# Patient Record
Sex: Female | Born: 1945 | Race: White | Hispanic: No | Marital: Single | Smoking: Former smoker
Health system: Southern US, Community
[De-identification: ages and names within clinical notes are randomized; demographics above are authoritative.]

## PROBLEM LIST (undated history)

## (undated) DIAGNOSIS — F329 Major depressive disorder, single episode, unspecified: Secondary | ICD-10-CM

## (undated) DIAGNOSIS — I82409 Acute embolism and thrombosis of unspecified deep veins of unspecified lower extremity: Secondary | ICD-10-CM

## (undated) DIAGNOSIS — N3010 Interstitial cystitis (chronic) without hematuria: Secondary | ICD-10-CM

## (undated) DIAGNOSIS — M797 Fibromyalgia: Secondary | ICD-10-CM

## (undated) DIAGNOSIS — T8859XA Other complications of anesthesia, initial encounter: Secondary | ICD-10-CM

## (undated) DIAGNOSIS — F419 Anxiety disorder, unspecified: Secondary | ICD-10-CM

## (undated) DIAGNOSIS — M1712 Unilateral primary osteoarthritis, left knee: Secondary | ICD-10-CM

## (undated) DIAGNOSIS — T4145XA Adverse effect of unspecified anesthetic, initial encounter: Secondary | ICD-10-CM

## (undated) DIAGNOSIS — K219 Gastro-esophageal reflux disease without esophagitis: Secondary | ICD-10-CM

## (undated) DIAGNOSIS — N3941 Urge incontinence: Secondary | ICD-10-CM

## (undated) DIAGNOSIS — K589 Irritable bowel syndrome without diarrhea: Secondary | ICD-10-CM

## (undated) DIAGNOSIS — C4331 Malignant melanoma of nose: Secondary | ICD-10-CM

## (undated) DIAGNOSIS — Z6841 Body Mass Index (BMI) 40.0 and over, adult: Secondary | ICD-10-CM

## (undated) DIAGNOSIS — K8020 Calculus of gallbladder without cholecystitis without obstruction: Secondary | ICD-10-CM

## (undated) DIAGNOSIS — H269 Unspecified cataract: Secondary | ICD-10-CM

## (undated) DIAGNOSIS — K31819 Angiodysplasia of stomach and duodenum without bleeding: Secondary | ICD-10-CM

## (undated) DIAGNOSIS — G4733 Obstructive sleep apnea (adult) (pediatric): Secondary | ICD-10-CM

## (undated) DIAGNOSIS — E785 Hyperlipidemia, unspecified: Secondary | ICD-10-CM

## (undated) DIAGNOSIS — Z9289 Personal history of other medical treatment: Secondary | ICD-10-CM

## (undated) DIAGNOSIS — Z8489 Family history of other specified conditions: Secondary | ICD-10-CM

## (undated) DIAGNOSIS — G8929 Other chronic pain: Secondary | ICD-10-CM

## (undated) DIAGNOSIS — G510 Bell's palsy: Secondary | ICD-10-CM

## (undated) DIAGNOSIS — I872 Venous insufficiency (chronic) (peripheral): Secondary | ICD-10-CM

## (undated) DIAGNOSIS — C50919 Malignant neoplasm of unspecified site of unspecified female breast: Secondary | ICD-10-CM

## (undated) DIAGNOSIS — I700 Atherosclerosis of aorta: Secondary | ICD-10-CM

## (undated) DIAGNOSIS — Z720 Tobacco use: Secondary | ICD-10-CM

## (undated) DIAGNOSIS — M545 Low back pain: Secondary | ICD-10-CM

## (undated) HISTORY — DX: Urge incontinence: N39.41

## (undated) HISTORY — DX: Body Mass Index (BMI) 40.0 and over, adult: Z684

## (undated) HISTORY — DX: Irritable bowel syndrome, unspecified: K58.9

## (undated) HISTORY — DX: Calculus of gallbladder without cholecystitis without obstruction: K80.20

## (undated) HISTORY — DX: Unspecified cataract: H26.9

## (undated) HISTORY — PX: MASTECTOMY: SHX3

## (undated) HISTORY — DX: Morbid (severe) obesity due to excess calories: E66.01

## (undated) HISTORY — DX: Tobacco use: Z72.0

## (undated) HISTORY — DX: Gastro-esophageal reflux disease without esophagitis: K21.9

## (undated) HISTORY — PX: MELANOMA EXCISION: SHX5266

## (undated) HISTORY — PX: TUBAL LIGATION: SHX77

## (undated) HISTORY — DX: Interstitial cystitis (chronic) without hematuria: N30.10

## (undated) HISTORY — DX: Major depressive disorder, single episode, unspecified: F32.9

## (undated) HISTORY — PX: DILATION AND CURETTAGE OF UTERUS: SHX78

## (undated) HISTORY — DX: Angiodysplasia of stomach and duodenum without bleeding: K31.819

## (undated) HISTORY — DX: Obstructive sleep apnea (adult) (pediatric): G47.33

## (undated) HISTORY — DX: Atherosclerosis of aorta: I70.0

## (undated) HISTORY — DX: Personal history of other medical treatment: Z92.89

## (undated) HISTORY — DX: Acute embolism and thrombosis of unspecified deep veins of unspecified lower extremity: I82.409

## (undated) HISTORY — DX: Low back pain: M54.5

## (undated) HISTORY — PX: CERVICAL DISCECTOMY: SHX98

## (undated) HISTORY — DX: Venous insufficiency (chronic) (peripheral): I87.2

## (undated) HISTORY — DX: Unilateral primary osteoarthritis, left knee: M17.12

## (undated) HISTORY — PX: APPENDECTOMY: SHX54

## (undated) HISTORY — DX: Hyperlipidemia, unspecified: E78.5

## (undated) HISTORY — DX: Other chronic pain: G89.29

## (undated) HISTORY — PX: SPINE SURGERY: SHX786

## (undated) HISTORY — PX: BACK SURGERY: SHX140

## (undated) HISTORY — PX: CARPAL TUNNEL RELEASE: SHX101

---
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
## 1974-01-20 HISTORY — PX: ABDOMINAL HYSTERECTOMY: SHX81

## 1978-09-21 DIAGNOSIS — G510 Bell's palsy: Secondary | ICD-10-CM

## 1978-09-21 HISTORY — DX: Bell's palsy: G51.0

## 1990-01-20 HISTORY — PX: BREAST SURGERY: SHX581

## 1991-01-21 HISTORY — PX: ORIF ANKLE FRACTURE: SUR919

## 1991-01-21 HISTORY — PX: BLADDER SUSPENSION: SHX72

## 1991-01-21 HISTORY — PX: RHINOPLASTY: SUR1284

## 1997-05-02 ENCOUNTER — Emergency Department (HOSPITAL_COMMUNITY): Admission: EM | Admit: 1997-05-02 | Discharge: 1997-05-02 | Payer: Self-pay | Admitting: Emergency Medicine

## 1997-05-05 ENCOUNTER — Encounter: Admission: RE | Admit: 1997-05-05 | Discharge: 1997-05-05 | Payer: Self-pay | Admitting: Internal Medicine

## 1997-05-12 ENCOUNTER — Encounter: Admission: RE | Admit: 1997-05-12 | Discharge: 1997-05-12 | Payer: Self-pay | Admitting: Internal Medicine

## 1997-05-19 ENCOUNTER — Encounter: Admission: RE | Admit: 1997-05-19 | Discharge: 1997-05-19 | Payer: Self-pay | Admitting: Internal Medicine

## 1997-06-15 ENCOUNTER — Encounter: Admission: RE | Admit: 1997-06-15 | Discharge: 1997-06-15 | Payer: Self-pay | Admitting: Internal Medicine

## 1997-06-27 ENCOUNTER — Ambulatory Visit: Admission: RE | Admit: 1997-06-27 | Discharge: 1997-06-27 | Payer: Self-pay | Admitting: *Deleted

## 1997-07-07 ENCOUNTER — Encounter: Admission: RE | Admit: 1997-07-07 | Discharge: 1997-07-07 | Payer: Self-pay | Admitting: Hematology and Oncology

## 1997-07-07 ENCOUNTER — Ambulatory Visit (HOSPITAL_COMMUNITY): Admission: RE | Admit: 1997-07-07 | Discharge: 1997-07-07 | Payer: Self-pay | Admitting: *Deleted

## 1997-07-12 ENCOUNTER — Ambulatory Visit (HOSPITAL_COMMUNITY): Admission: RE | Admit: 1997-07-12 | Discharge: 1997-07-12 | Payer: Self-pay | Admitting: *Deleted

## 1997-07-14 ENCOUNTER — Encounter: Admission: RE | Admit: 1997-07-14 | Discharge: 1997-07-14 | Payer: Self-pay | Admitting: Internal Medicine

## 1997-07-26 ENCOUNTER — Encounter: Admission: RE | Admit: 1997-07-26 | Discharge: 1997-07-26 | Payer: Self-pay | Admitting: Internal Medicine

## 1997-09-04 ENCOUNTER — Ambulatory Visit (HOSPITAL_BASED_OUTPATIENT_CLINIC_OR_DEPARTMENT_OTHER): Admission: RE | Admit: 1997-09-04 | Discharge: 1997-09-04 | Payer: Self-pay | Admitting: Plastic Surgery

## 1997-09-20 ENCOUNTER — Encounter: Admission: RE | Admit: 1997-09-20 | Discharge: 1997-09-20 | Payer: Self-pay | Admitting: Internal Medicine

## 1997-11-01 ENCOUNTER — Encounter: Admission: RE | Admit: 1997-11-01 | Discharge: 1997-11-01 | Payer: Self-pay | Admitting: Internal Medicine

## 1997-11-24 ENCOUNTER — Encounter: Admission: RE | Admit: 1997-11-24 | Discharge: 1997-11-24 | Payer: Self-pay | Admitting: Internal Medicine

## 1998-02-28 ENCOUNTER — Encounter: Admission: RE | Admit: 1998-02-28 | Discharge: 1998-02-28 | Payer: Self-pay | Admitting: Internal Medicine

## 1998-03-01 ENCOUNTER — Encounter: Admission: RE | Admit: 1998-03-01 | Discharge: 1998-03-01 | Payer: Self-pay | Admitting: Internal Medicine

## 1998-03-07 ENCOUNTER — Encounter: Payer: Self-pay | Admitting: Hematology and Oncology

## 1998-03-07 ENCOUNTER — Encounter: Admission: RE | Admit: 1998-03-07 | Discharge: 1998-03-07 | Payer: Self-pay | Admitting: Internal Medicine

## 1998-03-07 ENCOUNTER — Ambulatory Visit (HOSPITAL_COMMUNITY): Admission: RE | Admit: 1998-03-07 | Discharge: 1998-03-07 | Payer: Self-pay | Admitting: Hematology and Oncology

## 1998-03-15 ENCOUNTER — Ambulatory Visit (HOSPITAL_COMMUNITY): Admission: RE | Admit: 1998-03-15 | Discharge: 1998-03-15 | Payer: Self-pay | Admitting: Gastroenterology

## 1998-04-19 ENCOUNTER — Encounter: Admission: RE | Admit: 1998-04-19 | Discharge: 1998-04-19 | Payer: Self-pay | Admitting: Internal Medicine

## 1998-06-11 ENCOUNTER — Encounter: Admission: RE | Admit: 1998-06-11 | Discharge: 1998-06-11 | Payer: Self-pay | Admitting: Internal Medicine

## 1998-06-11 ENCOUNTER — Ambulatory Visit (HOSPITAL_COMMUNITY): Admission: RE | Admit: 1998-06-11 | Discharge: 1998-06-11 | Payer: Self-pay | Admitting: Internal Medicine

## 1998-06-22 ENCOUNTER — Encounter: Admission: RE | Admit: 1998-06-22 | Discharge: 1998-06-22 | Payer: Self-pay | Admitting: Internal Medicine

## 1998-06-26 ENCOUNTER — Encounter: Admission: RE | Admit: 1998-06-26 | Discharge: 1998-06-26 | Payer: Self-pay | Admitting: Internal Medicine

## 1998-07-17 ENCOUNTER — Encounter: Admission: RE | Admit: 1998-07-17 | Discharge: 1998-07-17 | Payer: Self-pay | Admitting: Internal Medicine

## 1998-08-07 ENCOUNTER — Encounter: Admission: RE | Admit: 1998-08-07 | Discharge: 1998-08-07 | Payer: Self-pay | Admitting: Hematology and Oncology

## 1998-08-20 ENCOUNTER — Ambulatory Visit (HOSPITAL_BASED_OUTPATIENT_CLINIC_OR_DEPARTMENT_OTHER): Admission: RE | Admit: 1998-08-20 | Discharge: 1998-08-20 | Payer: Self-pay | Admitting: Urology

## 1998-09-04 ENCOUNTER — Encounter: Admission: RE | Admit: 1998-09-04 | Discharge: 1998-09-04 | Payer: Self-pay | Admitting: Internal Medicine

## 1998-11-09 ENCOUNTER — Encounter: Admission: RE | Admit: 1998-11-09 | Discharge: 1998-11-09 | Payer: Self-pay | Admitting: Internal Medicine

## 1998-11-13 ENCOUNTER — Encounter: Admission: RE | Admit: 1998-11-13 | Discharge: 1998-11-13 | Payer: Self-pay | Admitting: Internal Medicine

## 1998-11-14 ENCOUNTER — Encounter: Admission: RE | Admit: 1998-11-14 | Discharge: 1998-11-14 | Payer: Self-pay | Admitting: Internal Medicine

## 1998-12-11 ENCOUNTER — Encounter: Admission: RE | Admit: 1998-12-11 | Discharge: 1998-12-11 | Payer: Self-pay | Admitting: Internal Medicine

## 1998-12-28 ENCOUNTER — Encounter: Admission: RE | Admit: 1998-12-28 | Discharge: 1998-12-28 | Payer: Self-pay | Admitting: Internal Medicine

## 1999-02-12 ENCOUNTER — Encounter: Admission: RE | Admit: 1999-02-12 | Discharge: 1999-02-12 | Payer: Self-pay | Admitting: Internal Medicine

## 1999-07-16 ENCOUNTER — Encounter: Admission: RE | Admit: 1999-07-16 | Discharge: 1999-07-16 | Payer: Self-pay | Admitting: Internal Medicine

## 1999-07-20 ENCOUNTER — Ambulatory Visit (HOSPITAL_COMMUNITY): Admission: RE | Admit: 1999-07-20 | Discharge: 1999-07-20 | Payer: Self-pay | Admitting: Internal Medicine

## 1999-07-20 ENCOUNTER — Encounter: Payer: Self-pay | Admitting: Internal Medicine

## 1999-09-17 ENCOUNTER — Encounter: Admission: RE | Admit: 1999-09-17 | Discharge: 1999-09-17 | Payer: Self-pay | Admitting: Internal Medicine

## 1999-11-16 ENCOUNTER — Encounter: Payer: Self-pay | Admitting: Internal Medicine

## 1999-11-16 ENCOUNTER — Ambulatory Visit (HOSPITAL_COMMUNITY): Admission: RE | Admit: 1999-11-16 | Discharge: 1999-11-16 | Payer: Self-pay | Admitting: Internal Medicine

## 1999-11-19 ENCOUNTER — Encounter: Admission: RE | Admit: 1999-11-19 | Discharge: 1999-11-19 | Payer: Self-pay | Admitting: Internal Medicine

## 1999-11-28 ENCOUNTER — Encounter: Admission: RE | Admit: 1999-11-28 | Discharge: 1999-11-28 | Payer: Self-pay | Admitting: Hematology and Oncology

## 1999-12-05 ENCOUNTER — Encounter: Admission: RE | Admit: 1999-12-05 | Discharge: 1999-12-05 | Payer: Self-pay | Admitting: Internal Medicine

## 2000-02-11 ENCOUNTER — Encounter: Admission: RE | Admit: 2000-02-11 | Discharge: 2000-02-11 | Payer: Self-pay | Admitting: Internal Medicine

## 2000-04-04 ENCOUNTER — Ambulatory Visit (HOSPITAL_COMMUNITY): Admission: RE | Admit: 2000-04-04 | Discharge: 2000-04-04 | Payer: Self-pay | Admitting: Internal Medicine

## 2000-04-04 ENCOUNTER — Encounter: Payer: Self-pay | Admitting: Internal Medicine

## 2000-04-07 ENCOUNTER — Encounter: Admission: RE | Admit: 2000-04-07 | Discharge: 2000-04-07 | Payer: Self-pay | Admitting: Internal Medicine

## 2000-07-28 ENCOUNTER — Encounter: Admission: RE | Admit: 2000-07-28 | Discharge: 2000-07-28 | Payer: Self-pay | Admitting: Internal Medicine

## 2000-08-14 ENCOUNTER — Encounter: Admission: RE | Admit: 2000-08-14 | Discharge: 2000-08-14 | Payer: Self-pay | Admitting: Internal Medicine

## 2000-09-08 ENCOUNTER — Ambulatory Visit (HOSPITAL_COMMUNITY): Admission: RE | Admit: 2000-09-08 | Discharge: 2000-09-08 | Payer: Self-pay | Admitting: *Deleted

## 2001-01-05 ENCOUNTER — Encounter: Admission: RE | Admit: 2001-01-05 | Discharge: 2001-01-05 | Payer: Self-pay | Admitting: Internal Medicine

## 2001-05-04 ENCOUNTER — Encounter: Payer: Self-pay | Admitting: Internal Medicine

## 2001-05-04 ENCOUNTER — Ambulatory Visit (HOSPITAL_COMMUNITY): Admission: RE | Admit: 2001-05-04 | Discharge: 2001-05-04 | Payer: Self-pay | Admitting: Internal Medicine

## 2001-05-11 ENCOUNTER — Encounter: Admission: RE | Admit: 2001-05-11 | Discharge: 2001-05-11 | Payer: Self-pay | Admitting: Internal Medicine

## 2001-05-19 ENCOUNTER — Emergency Department (HOSPITAL_COMMUNITY): Admission: EM | Admit: 2001-05-19 | Discharge: 2001-05-19 | Payer: Self-pay | Admitting: Emergency Medicine

## 2001-05-19 ENCOUNTER — Encounter: Payer: Self-pay | Admitting: Emergency Medicine

## 2001-09-07 ENCOUNTER — Encounter: Admission: RE | Admit: 2001-09-07 | Discharge: 2001-09-07 | Payer: Self-pay | Admitting: Internal Medicine

## 2001-09-20 DIAGNOSIS — C4331 Malignant melanoma of nose: Secondary | ICD-10-CM

## 2001-09-20 HISTORY — DX: Malignant melanoma of nose: C43.31

## 2001-11-09 ENCOUNTER — Encounter: Admission: RE | Admit: 2001-11-09 | Discharge: 2001-11-09 | Payer: Self-pay | Admitting: Internal Medicine

## 2002-01-04 ENCOUNTER — Encounter: Admission: RE | Admit: 2002-01-04 | Discharge: 2002-01-04 | Payer: Self-pay | Admitting: Internal Medicine

## 2002-02-09 ENCOUNTER — Encounter: Admission: RE | Admit: 2002-02-09 | Discharge: 2002-02-09 | Payer: Self-pay | Admitting: Internal Medicine

## 2002-02-23 ENCOUNTER — Encounter: Payer: Self-pay | Admitting: Internal Medicine

## 2002-02-23 ENCOUNTER — Ambulatory Visit (HOSPITAL_COMMUNITY): Admission: RE | Admit: 2002-02-23 | Discharge: 2002-02-23 | Payer: Self-pay | Admitting: Internal Medicine

## 2002-02-23 ENCOUNTER — Encounter: Admission: RE | Admit: 2002-02-23 | Discharge: 2002-02-23 | Payer: Self-pay | Admitting: Internal Medicine

## 2002-03-04 ENCOUNTER — Encounter: Admission: RE | Admit: 2002-03-04 | Discharge: 2002-03-04 | Payer: Self-pay | Admitting: Sports Medicine

## 2002-03-04 ENCOUNTER — Encounter: Payer: Self-pay | Admitting: Sports Medicine

## 2002-03-10 ENCOUNTER — Encounter: Admission: RE | Admit: 2002-03-10 | Discharge: 2002-03-10 | Payer: Self-pay | Admitting: Sports Medicine

## 2002-03-10 ENCOUNTER — Encounter: Payer: Self-pay | Admitting: Sports Medicine

## 2002-04-05 ENCOUNTER — Encounter: Admission: RE | Admit: 2002-04-05 | Discharge: 2002-04-05 | Payer: Self-pay | Admitting: Internal Medicine

## 2002-04-20 ENCOUNTER — Encounter: Admission: RE | Admit: 2002-04-20 | Discharge: 2002-04-20 | Payer: Self-pay | Admitting: Neurosurgery

## 2002-04-20 ENCOUNTER — Encounter: Payer: Self-pay | Admitting: Neurosurgery

## 2002-04-24 ENCOUNTER — Emergency Department (HOSPITAL_COMMUNITY): Admission: EM | Admit: 2002-04-24 | Discharge: 2002-04-25 | Payer: Self-pay | Admitting: Emergency Medicine

## 2002-04-25 ENCOUNTER — Encounter: Payer: Self-pay | Admitting: Emergency Medicine

## 2002-06-02 ENCOUNTER — Ambulatory Visit (HOSPITAL_COMMUNITY): Admission: RE | Admit: 2002-06-02 | Discharge: 2002-06-02 | Payer: Self-pay | Admitting: Neurosurgery

## 2002-06-02 ENCOUNTER — Encounter: Payer: Self-pay | Admitting: Neurosurgery

## 2002-06-14 ENCOUNTER — Encounter: Payer: Self-pay | Admitting: Neurosurgery

## 2002-06-15 ENCOUNTER — Encounter: Payer: Self-pay | Admitting: Neurosurgery

## 2002-06-15 ENCOUNTER — Ambulatory Visit (HOSPITAL_COMMUNITY): Admission: RE | Admit: 2002-06-15 | Discharge: 2002-06-16 | Payer: Self-pay | Admitting: Neurosurgery

## 2002-07-12 ENCOUNTER — Encounter: Payer: Self-pay | Admitting: Neurosurgery

## 2002-07-12 ENCOUNTER — Encounter: Admission: RE | Admit: 2002-07-12 | Discharge: 2002-07-12 | Payer: Self-pay | Admitting: Neurosurgery

## 2002-08-23 ENCOUNTER — Encounter: Admission: RE | Admit: 2002-08-23 | Discharge: 2002-08-23 | Payer: Self-pay | Admitting: Internal Medicine

## 2002-09-06 ENCOUNTER — Ambulatory Visit (HOSPITAL_BASED_OUTPATIENT_CLINIC_OR_DEPARTMENT_OTHER): Admission: RE | Admit: 2002-09-06 | Discharge: 2002-09-06 | Payer: Self-pay | Admitting: Internal Medicine

## 2002-12-27 ENCOUNTER — Encounter: Admission: RE | Admit: 2002-12-27 | Discharge: 2002-12-27 | Payer: Self-pay | Admitting: Internal Medicine

## 2003-01-06 ENCOUNTER — Emergency Department (HOSPITAL_COMMUNITY): Admission: EM | Admit: 2003-01-06 | Discharge: 2003-01-06 | Payer: Self-pay | Admitting: Emergency Medicine

## 2003-01-10 ENCOUNTER — Encounter: Admission: RE | Admit: 2003-01-10 | Discharge: 2003-01-10 | Payer: Self-pay | Admitting: Internal Medicine

## 2003-01-10 ENCOUNTER — Inpatient Hospital Stay (HOSPITAL_COMMUNITY): Admission: AD | Admit: 2003-01-10 | Discharge: 2003-01-15 | Payer: Self-pay | Admitting: Internal Medicine

## 2003-01-18 ENCOUNTER — Encounter: Admission: RE | Admit: 2003-01-18 | Discharge: 2003-01-18 | Payer: Self-pay | Admitting: Internal Medicine

## 2003-01-25 ENCOUNTER — Ambulatory Visit (HOSPITAL_BASED_OUTPATIENT_CLINIC_OR_DEPARTMENT_OTHER): Admission: RE | Admit: 2003-01-25 | Discharge: 2003-01-25 | Payer: Self-pay | Admitting: Internal Medicine

## 2003-01-31 ENCOUNTER — Encounter: Admission: RE | Admit: 2003-01-31 | Discharge: 2003-01-31 | Payer: Self-pay | Admitting: Internal Medicine

## 2003-03-07 ENCOUNTER — Encounter: Admission: RE | Admit: 2003-03-07 | Discharge: 2003-03-07 | Payer: Self-pay | Admitting: Internal Medicine

## 2003-03-13 ENCOUNTER — Encounter: Admission: RE | Admit: 2003-03-13 | Discharge: 2003-03-13 | Payer: Self-pay | Admitting: Internal Medicine

## 2003-05-09 ENCOUNTER — Encounter: Admission: RE | Admit: 2003-05-09 | Discharge: 2003-05-09 | Payer: Self-pay | Admitting: Internal Medicine

## 2003-08-04 ENCOUNTER — Emergency Department (HOSPITAL_COMMUNITY): Admission: EM | Admit: 2003-08-04 | Discharge: 2003-08-05 | Payer: Self-pay | Admitting: Emergency Medicine

## 2003-08-07 ENCOUNTER — Encounter: Admission: RE | Admit: 2003-08-07 | Discharge: 2003-08-07 | Payer: Self-pay | Admitting: Internal Medicine

## 2003-08-15 ENCOUNTER — Ambulatory Visit: Admission: RE | Admit: 2003-08-15 | Discharge: 2003-08-15 | Payer: Self-pay | Admitting: Internal Medicine

## 2003-08-15 ENCOUNTER — Encounter (INDEPENDENT_AMBULATORY_CARE_PROVIDER_SITE_OTHER): Payer: Self-pay | Admitting: Cardiology

## 2003-08-16 ENCOUNTER — Ambulatory Visit (HOSPITAL_COMMUNITY): Admission: RE | Admit: 2003-08-16 | Discharge: 2003-08-16 | Payer: Self-pay | Admitting: Internal Medicine

## 2003-08-17 ENCOUNTER — Ambulatory Visit (HOSPITAL_COMMUNITY): Admission: RE | Admit: 2003-08-17 | Discharge: 2003-08-17 | Payer: Self-pay | Admitting: Internal Medicine

## 2003-08-17 ENCOUNTER — Encounter: Admission: RE | Admit: 2003-08-17 | Discharge: 2003-08-17 | Payer: Self-pay | Admitting: Internal Medicine

## 2003-08-24 ENCOUNTER — Encounter: Admission: RE | Admit: 2003-08-24 | Discharge: 2003-08-24 | Payer: Self-pay | Admitting: Internal Medicine

## 2003-09-21 ENCOUNTER — Ambulatory Visit: Payer: Self-pay | Admitting: Internal Medicine

## 2003-10-25 ENCOUNTER — Ambulatory Visit: Payer: Self-pay | Admitting: Internal Medicine

## 2003-11-02 ENCOUNTER — Ambulatory Visit: Payer: Self-pay | Admitting: Internal Medicine

## 2003-12-21 ENCOUNTER — Ambulatory Visit (HOSPITAL_COMMUNITY): Admission: RE | Admit: 2003-12-21 | Discharge: 2003-12-21 | Payer: Self-pay | Admitting: Gastroenterology

## 2004-02-20 ENCOUNTER — Ambulatory Visit: Payer: Self-pay | Admitting: Internal Medicine

## 2004-05-16 ENCOUNTER — Emergency Department (HOSPITAL_COMMUNITY): Admission: EM | Admit: 2004-05-16 | Discharge: 2004-05-16 | Payer: Self-pay | Admitting: Emergency Medicine

## 2004-06-25 ENCOUNTER — Ambulatory Visit: Payer: Self-pay | Admitting: Internal Medicine

## 2004-06-25 ENCOUNTER — Ambulatory Visit (HOSPITAL_COMMUNITY): Admission: RE | Admit: 2004-06-25 | Discharge: 2004-06-25 | Payer: Self-pay | Admitting: Internal Medicine

## 2004-07-30 ENCOUNTER — Ambulatory Visit: Payer: Self-pay | Admitting: Internal Medicine

## 2004-10-08 ENCOUNTER — Ambulatory Visit: Payer: Self-pay | Admitting: Internal Medicine

## 2004-11-27 ENCOUNTER — Ambulatory Visit: Payer: Self-pay | Admitting: Internal Medicine

## 2004-12-06 ENCOUNTER — Emergency Department (HOSPITAL_COMMUNITY): Admission: EM | Admit: 2004-12-06 | Discharge: 2004-12-06 | Payer: Self-pay | Admitting: Emergency Medicine

## 2004-12-19 ENCOUNTER — Ambulatory Visit: Payer: Self-pay | Admitting: Internal Medicine

## 2004-12-19 ENCOUNTER — Ambulatory Visit (HOSPITAL_COMMUNITY): Admission: RE | Admit: 2004-12-19 | Discharge: 2004-12-19 | Payer: Self-pay | Admitting: Hospitalist

## 2004-12-24 ENCOUNTER — Encounter: Admission: RE | Admit: 2004-12-24 | Discharge: 2004-12-24 | Payer: Self-pay | Admitting: Internal Medicine

## 2005-02-04 ENCOUNTER — Ambulatory Visit: Payer: Self-pay | Admitting: Internal Medicine

## 2005-02-07 ENCOUNTER — Ambulatory Visit: Payer: Self-pay | Admitting: Internal Medicine

## 2005-03-04 ENCOUNTER — Ambulatory Visit: Payer: Self-pay | Admitting: Internal Medicine

## 2005-03-17 ENCOUNTER — Ambulatory Visit: Payer: Self-pay | Admitting: Internal Medicine

## 2005-09-02 ENCOUNTER — Ambulatory Visit: Payer: Self-pay | Admitting: Internal Medicine

## 2005-09-11 ENCOUNTER — Encounter (INDEPENDENT_AMBULATORY_CARE_PROVIDER_SITE_OTHER): Payer: Self-pay | Admitting: Internal Medicine

## 2005-09-11 ENCOUNTER — Ambulatory Visit (HOSPITAL_COMMUNITY): Admission: RE | Admit: 2005-09-11 | Discharge: 2005-09-11 | Payer: Self-pay | Admitting: Internal Medicine

## 2005-09-11 ENCOUNTER — Encounter: Payer: Self-pay | Admitting: Pulmonary Disease

## 2005-10-07 ENCOUNTER — Encounter: Admission: RE | Admit: 2005-10-07 | Discharge: 2005-11-06 | Payer: Self-pay | Admitting: *Deleted

## 2005-11-05 DIAGNOSIS — F339 Major depressive disorder, recurrent, unspecified: Secondary | ICD-10-CM | POA: Insufficient documentation

## 2005-11-05 DIAGNOSIS — F329 Major depressive disorder, single episode, unspecified: Secondary | ICD-10-CM

## 2005-11-05 DIAGNOSIS — M545 Low back pain, unspecified: Secondary | ICD-10-CM | POA: Insufficient documentation

## 2005-11-05 DIAGNOSIS — I872 Venous insufficiency (chronic) (peripheral): Secondary | ICD-10-CM | POA: Insufficient documentation

## 2005-11-05 DIAGNOSIS — Z720 Tobacco use: Secondary | ICD-10-CM

## 2005-11-05 DIAGNOSIS — K589 Irritable bowel syndrome without diarrhea: Secondary | ICD-10-CM | POA: Insufficient documentation

## 2005-11-05 DIAGNOSIS — G4733 Obstructive sleep apnea (adult) (pediatric): Secondary | ICD-10-CM

## 2005-11-05 DIAGNOSIS — N3010 Interstitial cystitis (chronic) without hematuria: Secondary | ICD-10-CM | POA: Insufficient documentation

## 2005-11-05 DIAGNOSIS — Z85828 Personal history of other malignant neoplasm of skin: Secondary | ICD-10-CM

## 2005-11-05 DIAGNOSIS — G8929 Other chronic pain: Secondary | ICD-10-CM

## 2005-11-05 HISTORY — DX: Obstructive sleep apnea (adult) (pediatric): G47.33

## 2005-11-05 HISTORY — DX: Low back pain, unspecified: M54.50

## 2005-11-05 HISTORY — DX: Tobacco use: Z72.0

## 2005-11-05 HISTORY — DX: Major depressive disorder, single episode, unspecified: F32.9

## 2005-11-10 ENCOUNTER — Ambulatory Visit: Payer: Self-pay | Admitting: Internal Medicine

## 2005-11-11 ENCOUNTER — Ambulatory Visit: Payer: Self-pay | Admitting: Internal Medicine

## 2005-11-25 ENCOUNTER — Encounter: Admission: RE | Admit: 2005-11-25 | Discharge: 2006-02-10 | Payer: Self-pay | Admitting: Internal Medicine

## 2005-12-25 DIAGNOSIS — Z8669 Personal history of other diseases of the nervous system and sense organs: Secondary | ICD-10-CM | POA: Insufficient documentation

## 2005-12-25 DIAGNOSIS — Z978 Presence of other specified devices: Secondary | ICD-10-CM

## 2005-12-25 DIAGNOSIS — Z9010 Acquired absence of unspecified breast and nipple: Secondary | ICD-10-CM

## 2005-12-25 DIAGNOSIS — Z9079 Acquired absence of other genital organ(s): Secondary | ICD-10-CM | POA: Insufficient documentation

## 2005-12-25 DIAGNOSIS — Z9889 Other specified postprocedural states: Secondary | ICD-10-CM

## 2005-12-25 DIAGNOSIS — S82899B Other fracture of unspecified lower leg, initial encounter for open fracture type I or II: Secondary | ICD-10-CM | POA: Insufficient documentation

## 2005-12-26 DIAGNOSIS — T7411XA Adult physical abuse, confirmed, initial encounter: Secondary | ICD-10-CM | POA: Insufficient documentation

## 2005-12-26 DIAGNOSIS — Z9889 Other specified postprocedural states: Secondary | ICD-10-CM

## 2006-04-07 ENCOUNTER — Ambulatory Visit: Payer: Self-pay | Admitting: Internal Medicine

## 2006-04-07 DIAGNOSIS — R42 Dizziness and giddiness: Secondary | ICD-10-CM

## 2006-04-23 ENCOUNTER — Encounter (INDEPENDENT_AMBULATORY_CARE_PROVIDER_SITE_OTHER): Payer: Self-pay | Admitting: Internal Medicine

## 2006-04-23 ENCOUNTER — Ambulatory Visit: Payer: Self-pay | Admitting: Internal Medicine

## 2006-05-08 LAB — CONVERTED CEMR LAB
Albumin: 3.8 g/dL (ref 3.5–5.2)
Alkaline Phosphatase: 167 units/L — ABNORMAL HIGH (ref 39–117)
Calcium: 8.6 mg/dL (ref 8.4–10.5)
Chloride: 109 meq/L (ref 96–112)
Glucose, Bld: 119 mg/dL — ABNORMAL HIGH (ref 70–99)
LDL Cholesterol: 85 mg/dL (ref 0–99)
Potassium: 4.3 meq/L (ref 3.5–5.3)
Sodium: 146 meq/L — ABNORMAL HIGH (ref 135–145)
Total Protein: 6.3 g/dL (ref 6.0–8.3)
Triglycerides: 139 mg/dL (ref <150)

## 2006-07-21 ENCOUNTER — Telehealth: Payer: Self-pay | Admitting: *Deleted

## 2006-08-04 ENCOUNTER — Encounter (INDEPENDENT_AMBULATORY_CARE_PROVIDER_SITE_OTHER): Payer: Self-pay | Admitting: *Deleted

## 2006-08-04 ENCOUNTER — Ambulatory Visit (HOSPITAL_COMMUNITY): Admission: RE | Admit: 2006-08-04 | Discharge: 2006-08-04 | Payer: Self-pay | Admitting: Internal Medicine

## 2006-08-04 ENCOUNTER — Encounter: Payer: Self-pay | Admitting: Internal Medicine

## 2006-08-04 ENCOUNTER — Ambulatory Visit: Payer: Self-pay | Admitting: *Deleted

## 2006-08-04 ENCOUNTER — Ambulatory Visit: Payer: Self-pay | Admitting: Hospitalist

## 2006-08-04 ENCOUNTER — Encounter (INDEPENDENT_AMBULATORY_CARE_PROVIDER_SITE_OTHER): Payer: Self-pay | Admitting: Internal Medicine

## 2006-08-04 DIAGNOSIS — R609 Edema, unspecified: Secondary | ICD-10-CM

## 2006-08-25 ENCOUNTER — Ambulatory Visit (HOSPITAL_COMMUNITY): Admission: RE | Admit: 2006-08-25 | Discharge: 2006-08-25 | Payer: Self-pay | Admitting: Internal Medicine

## 2006-08-25 ENCOUNTER — Ambulatory Visit: Payer: Self-pay | Admitting: Internal Medicine

## 2006-08-25 LAB — CONVERTED CEMR LAB
ALT: 13 units/L (ref 0–35)
AST: 17 units/L (ref 0–37)
Albumin: 3.4 g/dL — ABNORMAL LOW (ref 3.5–5.2)
Alkaline Phosphatase: 138 units/L — ABNORMAL HIGH (ref 39–117)
BUN: 10 mg/dL (ref 6–23)
CO2: 31 meq/L (ref 19–32)
Calcium: 8.6 mg/dL (ref 8.4–10.5)
Chloride: 107 meq/L (ref 96–112)
Creatinine, Ser: 0.85 mg/dL (ref 0.40–1.20)
Creatinine, Urine: 158.9 mg/dL
Glucose, Bld: 103 mg/dL — ABNORMAL HIGH (ref 70–99)
Microalb Creat Ratio: 7.7 mg/g (ref 0.0–30.0)
Microalb, Ur: 1.23 mg/dL (ref 0.00–1.89)
Potassium: 3.3 meq/L — ABNORMAL LOW (ref 3.5–5.3)
Pro B Natriuretic peptide (BNP): 76 pg/mL (ref 0.0–100.0)
Sodium: 144 meq/L (ref 135–145)
TSH: 1.461 microintl units/mL (ref 0.350–5.50)
Total Bilirubin: 0.4 mg/dL (ref 0.3–1.2)
Total Protein: 6.1 g/dL (ref 6.0–8.3)

## 2006-09-10 ENCOUNTER — Encounter (INDEPENDENT_AMBULATORY_CARE_PROVIDER_SITE_OTHER): Payer: Self-pay | Admitting: Internal Medicine

## 2006-09-10 ENCOUNTER — Ambulatory Visit: Admission: RE | Admit: 2006-09-10 | Discharge: 2006-09-10 | Payer: Self-pay | Admitting: Internal Medicine

## 2006-09-14 ENCOUNTER — Telehealth: Payer: Self-pay | Admitting: *Deleted

## 2006-09-16 ENCOUNTER — Ambulatory Visit: Payer: Self-pay | Admitting: Internal Medicine

## 2006-09-16 LAB — CONVERTED CEMR LAB
BUN: 16 mg/dL (ref 6–23)
CO2: 28 meq/L (ref 19–32)
Chloride: 105 meq/L (ref 96–112)
Creatinine, Ser: 0.88 mg/dL (ref 0.40–1.20)
Glucose, Bld: 80 mg/dL (ref 70–99)

## 2006-09-18 ENCOUNTER — Encounter (INDEPENDENT_AMBULATORY_CARE_PROVIDER_SITE_OTHER): Payer: Self-pay | Admitting: Internal Medicine

## 2006-09-27 ENCOUNTER — Emergency Department (HOSPITAL_COMMUNITY): Admission: EM | Admit: 2006-09-27 | Discharge: 2006-09-28 | Payer: Self-pay | Admitting: Emergency Medicine

## 2006-09-28 ENCOUNTER — Encounter (INDEPENDENT_AMBULATORY_CARE_PROVIDER_SITE_OTHER): Payer: Self-pay | Admitting: Internal Medicine

## 2006-10-06 ENCOUNTER — Ambulatory Visit: Payer: Self-pay | Admitting: Internal Medicine

## 2006-10-06 DIAGNOSIS — E669 Obesity, unspecified: Secondary | ICD-10-CM

## 2006-10-14 ENCOUNTER — Ambulatory Visit: Payer: Self-pay | Admitting: Internal Medicine

## 2006-10-14 LAB — CONVERTED CEMR LAB
BUN: 16 mg/dL (ref 6–23)
Calcium: 8.5 mg/dL (ref 8.4–10.5)
Creatinine, Ser: 0.79 mg/dL (ref 0.40–1.20)

## 2006-11-27 ENCOUNTER — Telehealth: Payer: Self-pay | Admitting: *Deleted

## 2006-12-01 ENCOUNTER — Ambulatory Visit: Payer: Self-pay | Admitting: Internal Medicine

## 2006-12-01 LAB — CONVERTED CEMR LAB
Calcium: 8.7 mg/dL (ref 8.4–10.5)
Potassium: 3.9 meq/L (ref 3.5–5.3)
Sodium: 145 meq/L (ref 135–145)

## 2006-12-07 ENCOUNTER — Telehealth: Payer: Self-pay | Admitting: *Deleted

## 2007-02-01 ENCOUNTER — Ambulatory Visit: Payer: Self-pay | Admitting: Internal Medicine

## 2007-02-19 ENCOUNTER — Telehealth (INDEPENDENT_AMBULATORY_CARE_PROVIDER_SITE_OTHER): Payer: Self-pay | Admitting: Pharmacy Technician

## 2007-02-21 HISTORY — PX: LUMBAR LAMINECTOMY/DECOMPRESSION MICRODISCECTOMY: SHX5026

## 2007-02-25 ENCOUNTER — Encounter: Admission: RE | Admit: 2007-02-25 | Discharge: 2007-02-25 | Payer: Self-pay | Admitting: Neurosurgery

## 2007-03-16 ENCOUNTER — Inpatient Hospital Stay (HOSPITAL_COMMUNITY): Admission: RE | Admit: 2007-03-16 | Discharge: 2007-03-25 | Payer: Self-pay | Admitting: Neurosurgery

## 2007-04-12 ENCOUNTER — Encounter (INDEPENDENT_AMBULATORY_CARE_PROVIDER_SITE_OTHER): Payer: Self-pay | Admitting: Internal Medicine

## 2007-04-21 ENCOUNTER — Telehealth: Payer: Self-pay | Admitting: *Deleted

## 2007-05-11 ENCOUNTER — Encounter (INDEPENDENT_AMBULATORY_CARE_PROVIDER_SITE_OTHER): Payer: Self-pay | Admitting: Internal Medicine

## 2007-05-17 ENCOUNTER — Encounter (INDEPENDENT_AMBULATORY_CARE_PROVIDER_SITE_OTHER): Payer: Self-pay | Admitting: Internal Medicine

## 2007-05-18 ENCOUNTER — Encounter (INDEPENDENT_AMBULATORY_CARE_PROVIDER_SITE_OTHER): Payer: Self-pay | Admitting: Internal Medicine

## 2007-05-18 ENCOUNTER — Ambulatory Visit (HOSPITAL_COMMUNITY): Admission: RE | Admit: 2007-05-18 | Discharge: 2007-05-18 | Payer: Self-pay | Admitting: Hospitalist

## 2007-05-18 ENCOUNTER — Ambulatory Visit: Payer: Self-pay | Admitting: Hospitalist

## 2007-05-19 LAB — CONVERTED CEMR LAB
CO2: 30 meq/L (ref 19–32)
Chloride: 104 meq/L (ref 96–112)
Potassium: 4.2 meq/L (ref 3.5–5.3)
Sodium: 146 meq/L — ABNORMAL HIGH (ref 135–145)

## 2007-05-24 ENCOUNTER — Ambulatory Visit (HOSPITAL_COMMUNITY): Admission: RE | Admit: 2007-05-24 | Discharge: 2007-05-24 | Payer: Self-pay | Admitting: Hospitalist

## 2007-06-01 ENCOUNTER — Ambulatory Visit: Payer: Self-pay | Admitting: Internal Medicine

## 2007-06-01 ENCOUNTER — Encounter (INDEPENDENT_AMBULATORY_CARE_PROVIDER_SITE_OTHER): Payer: Self-pay | Admitting: Internal Medicine

## 2007-06-03 ENCOUNTER — Telehealth (INDEPENDENT_AMBULATORY_CARE_PROVIDER_SITE_OTHER): Payer: Self-pay | Admitting: Internal Medicine

## 2007-06-07 LAB — CONVERTED CEMR LAB
ANA Titer 1: 1:80 {titer} — ABNORMAL HIGH
Angiotensin 1 Converting Enzyme: 95 units/L — ABNORMAL HIGH (ref 9–67)

## 2007-06-17 ENCOUNTER — Ambulatory Visit: Payer: Self-pay | Admitting: Pulmonary Disease

## 2007-06-17 DIAGNOSIS — R0602 Shortness of breath: Secondary | ICD-10-CM | POA: Insufficient documentation

## 2007-06-29 ENCOUNTER — Ambulatory Visit: Payer: Self-pay | Admitting: Internal Medicine

## 2007-06-29 LAB — CONVERTED CEMR LAB
ALT: 14 units/L (ref 0–35)
Bilirubin, Direct: 0.1 mg/dL (ref 0.0–0.3)
Total Bilirubin: 0.4 mg/dL (ref 0.3–1.2)

## 2007-06-30 ENCOUNTER — Encounter (INDEPENDENT_AMBULATORY_CARE_PROVIDER_SITE_OTHER): Payer: Self-pay | Admitting: Internal Medicine

## 2007-08-06 ENCOUNTER — Encounter (INDEPENDENT_AMBULATORY_CARE_PROVIDER_SITE_OTHER): Payer: Self-pay | Admitting: Internal Medicine

## 2007-08-09 ENCOUNTER — Telehealth: Payer: Self-pay | Admitting: *Deleted

## 2007-08-17 ENCOUNTER — Ambulatory Visit: Payer: Self-pay | Admitting: Internal Medicine

## 2007-09-09 ENCOUNTER — Encounter: Admission: RE | Admit: 2007-09-09 | Discharge: 2007-12-08 | Payer: Self-pay | Admitting: Internal Medicine

## 2007-09-15 ENCOUNTER — Encounter (INDEPENDENT_AMBULATORY_CARE_PROVIDER_SITE_OTHER): Payer: Self-pay | Admitting: Internal Medicine

## 2007-09-29 ENCOUNTER — Encounter (INDEPENDENT_AMBULATORY_CARE_PROVIDER_SITE_OTHER): Payer: Self-pay | Admitting: Internal Medicine

## 2007-10-06 ENCOUNTER — Ambulatory Visit: Payer: Self-pay | Admitting: Pulmonary Disease

## 2007-10-06 LAB — CONVERTED CEMR LAB
CO2: 31 meq/L (ref 19–32)
Chloride: 110 meq/L (ref 96–112)
GFR calc Af Amer: 109 mL/min
Glucose, Bld: 119 mg/dL — ABNORMAL HIGH (ref 70–99)
Potassium: 4.3 meq/L (ref 3.5–5.1)
Sodium: 145 meq/L (ref 135–145)

## 2007-10-08 ENCOUNTER — Telehealth: Payer: Self-pay | Admitting: *Deleted

## 2007-10-18 ENCOUNTER — Ambulatory Visit: Payer: Self-pay | Admitting: Internal Medicine

## 2007-10-18 ENCOUNTER — Ambulatory Visit: Payer: Self-pay | Admitting: Cardiovascular Disease

## 2007-10-25 ENCOUNTER — Encounter: Admission: RE | Admit: 2007-10-25 | Discharge: 2007-12-06 | Payer: Self-pay | Admitting: Anesthesiology

## 2007-10-26 ENCOUNTER — Encounter (INDEPENDENT_AMBULATORY_CARE_PROVIDER_SITE_OTHER): Payer: Self-pay | Admitting: Internal Medicine

## 2007-10-26 ENCOUNTER — Ambulatory Visit: Payer: Self-pay | Admitting: Anesthesiology

## 2007-10-29 ENCOUNTER — Emergency Department (HOSPITAL_COMMUNITY): Admission: EM | Admit: 2007-10-29 | Discharge: 2007-10-29 | Payer: Self-pay | Admitting: Emergency Medicine

## 2007-10-29 ENCOUNTER — Telehealth (INDEPENDENT_AMBULATORY_CARE_PROVIDER_SITE_OTHER): Payer: Self-pay | Admitting: Internal Medicine

## 2007-11-01 ENCOUNTER — Telehealth: Payer: Self-pay | Admitting: *Deleted

## 2007-11-04 ENCOUNTER — Encounter: Payer: Self-pay | Admitting: Internal Medicine

## 2007-11-04 ENCOUNTER — Ambulatory Visit: Payer: Self-pay | Admitting: Infectious Disease

## 2007-11-10 ENCOUNTER — Ambulatory Visit: Payer: Self-pay | Admitting: *Deleted

## 2007-11-10 ENCOUNTER — Inpatient Hospital Stay (HOSPITAL_COMMUNITY): Admission: EM | Admit: 2007-11-10 | Discharge: 2007-11-16 | Payer: Self-pay | Admitting: Emergency Medicine

## 2007-11-11 ENCOUNTER — Encounter (INDEPENDENT_AMBULATORY_CARE_PROVIDER_SITE_OTHER): Payer: Self-pay | Admitting: *Deleted

## 2007-11-22 ENCOUNTER — Ambulatory Visit: Payer: Self-pay | Admitting: *Deleted

## 2007-11-22 DIAGNOSIS — I2699 Other pulmonary embolism without acute cor pulmonale: Secondary | ICD-10-CM

## 2007-11-22 LAB — CONVERTED CEMR LAB
INR: 5.7
INR: 5.7 (ref 0.0–1.5)
Lymphocytes Relative: 23 % (ref 12–46)
Lymphs Abs: 2.2 10*3/uL (ref 0.7–4.0)
Monocytes Relative: 9 % (ref 3–12)
Neutro Abs: 6.3 10*3/uL (ref 1.7–7.7)
Neutrophils Relative %: 66 % (ref 43–77)
RBC: 4.72 M/uL (ref 3.87–5.11)
WBC: 9.6 10*3/uL (ref 4.0–10.5)

## 2007-11-26 LAB — CONVERTED CEMR LAB
AST: 31 units/L (ref 0–37)
Albumin: 3.6 g/dL (ref 3.5–5.2)
BUN: 10 mg/dL (ref 6–23)
Bilirubin Urine: NEGATIVE
Calcium: 9 mg/dL (ref 8.4–10.5)
Chloride: 97 meq/L (ref 96–112)
Ketones, ur: NEGATIVE mg/dL
Potassium: 3.4 meq/L — ABNORMAL LOW (ref 3.5–5.3)
Protein, ur: NEGATIVE mg/dL
Urobilinogen, UA: 1 (ref 0.0–1.0)

## 2007-11-29 ENCOUNTER — Encounter: Payer: Self-pay | Admitting: Internal Medicine

## 2007-11-29 ENCOUNTER — Ambulatory Visit: Payer: Self-pay | Admitting: *Deleted

## 2007-11-29 LAB — CONVERTED CEMR LAB

## 2007-11-30 ENCOUNTER — Encounter: Payer: Self-pay | Admitting: Internal Medicine

## 2007-12-01 LAB — CONVERTED CEMR LAB
Bilirubin Urine: NEGATIVE
Hemoglobin, Urine: NEGATIVE
Protein, ur: NEGATIVE mg/dL
Urobilinogen, UA: 0.2 (ref 0.0–1.0)

## 2007-12-07 ENCOUNTER — Telehealth (INDEPENDENT_AMBULATORY_CARE_PROVIDER_SITE_OTHER): Payer: Self-pay | Admitting: Pharmacist

## 2007-12-13 ENCOUNTER — Ambulatory Visit: Payer: Self-pay | Admitting: Internal Medicine

## 2007-12-13 ENCOUNTER — Encounter (INDEPENDENT_AMBULATORY_CARE_PROVIDER_SITE_OTHER): Payer: Self-pay | Admitting: Internal Medicine

## 2007-12-13 LAB — CONVERTED CEMR LAB
CO2: 23 meq/L (ref 19–32)
Calcium: 8.7 mg/dL (ref 8.4–10.5)
Chloride: 105 meq/L (ref 96–112)
Glucose, Bld: 94 mg/dL (ref 70–99)
Glucose, Urine, Semiquant: NEGATIVE
INR: 3.8
MCV: 86.8 fL (ref 78.0–100.0)
Platelets: 342 10*3/uL (ref 150–400)
Potassium: 3.9 meq/L (ref 3.5–5.3)
RBC: 5 M/uL (ref 3.87–5.11)
Sodium: 140 meq/L (ref 135–145)
WBC Urine, dipstick: NEGATIVE
WBC: 8 10*3/uL (ref 4.0–10.5)
pH: 5.5

## 2007-12-14 ENCOUNTER — Encounter (INDEPENDENT_AMBULATORY_CARE_PROVIDER_SITE_OTHER): Payer: Self-pay | Admitting: Internal Medicine

## 2007-12-14 LAB — CONVERTED CEMR LAB
Bilirubin Urine: NEGATIVE
Ketones, ur: NEGATIVE mg/dL
RBC / HPF: NONE SEEN (ref <3)
Specific Gravity, Urine: 1.027 (ref 1.005–1.03)
Urine Glucose: NEGATIVE mg/dL
pH: 8.5 — ABNORMAL HIGH (ref 5.0–8.0)

## 2007-12-17 ENCOUNTER — Telehealth (INDEPENDENT_AMBULATORY_CARE_PROVIDER_SITE_OTHER): Payer: Self-pay | Admitting: Pharmacist

## 2007-12-24 ENCOUNTER — Encounter (INDEPENDENT_AMBULATORY_CARE_PROVIDER_SITE_OTHER): Payer: Self-pay | Admitting: Internal Medicine

## 2007-12-27 ENCOUNTER — Ambulatory Visit: Payer: Self-pay | Admitting: Internal Medicine

## 2007-12-27 ENCOUNTER — Encounter (INDEPENDENT_AMBULATORY_CARE_PROVIDER_SITE_OTHER): Payer: Self-pay | Admitting: Internal Medicine

## 2007-12-27 LAB — CONVERTED CEMR LAB: INR: 3.7

## 2007-12-31 ENCOUNTER — Encounter (INDEPENDENT_AMBULATORY_CARE_PROVIDER_SITE_OTHER): Payer: Self-pay | Admitting: Internal Medicine

## 2007-12-31 ENCOUNTER — Ambulatory Visit: Payer: Self-pay | Admitting: Internal Medicine

## 2007-12-31 ENCOUNTER — Ambulatory Visit (HOSPITAL_COMMUNITY): Admission: RE | Admit: 2007-12-31 | Discharge: 2007-12-31 | Payer: Self-pay | Admitting: Internal Medicine

## 2007-12-31 ENCOUNTER — Encounter: Payer: Self-pay | Admitting: Pulmonary Disease

## 2007-12-31 DIAGNOSIS — D649 Anemia, unspecified: Secondary | ICD-10-CM | POA: Insufficient documentation

## 2007-12-31 LAB — CONVERTED CEMR LAB
Basophils Absolute: 0 10*3/uL (ref 0.0–0.1)
CO2: 27 meq/L (ref 19–32)
Calcium: 8.7 mg/dL (ref 8.4–10.5)
Eosinophils Relative: 6 % — ABNORMAL HIGH (ref 0–5)
Glucose, Bld: 120 mg/dL — ABNORMAL HIGH (ref 70–99)
Lymphocytes Relative: 26 % (ref 12–46)
Lymphs Abs: 2.1 10*3/uL (ref 0.7–4.0)
Neutrophils Relative %: 61 % (ref 43–77)
Platelets: 395 10*3/uL (ref 150–400)
Potassium: 4.2 meq/L (ref 3.5–5.3)
RDW: 18.2 % — ABNORMAL HIGH (ref 11.5–15.5)
Sodium: 143 meq/L (ref 135–145)
WBC: 8 10*3/uL (ref 4.0–10.5)

## 2008-01-17 ENCOUNTER — Ambulatory Visit: Payer: Self-pay | Admitting: Infectious Diseases

## 2008-01-17 LAB — CONVERTED CEMR LAB

## 2008-01-24 ENCOUNTER — Encounter (INDEPENDENT_AMBULATORY_CARE_PROVIDER_SITE_OTHER): Payer: Self-pay | Admitting: Internal Medicine

## 2008-01-26 ENCOUNTER — Encounter (INDEPENDENT_AMBULATORY_CARE_PROVIDER_SITE_OTHER): Payer: Self-pay | Admitting: Internal Medicine

## 2008-01-31 ENCOUNTER — Encounter (INDEPENDENT_AMBULATORY_CARE_PROVIDER_SITE_OTHER): Payer: Self-pay | Admitting: Internal Medicine

## 2008-02-14 ENCOUNTER — Ambulatory Visit: Payer: Self-pay | Admitting: Internal Medicine

## 2008-02-14 LAB — CONVERTED CEMR LAB: INR: 2.6

## 2008-03-16 ENCOUNTER — Ambulatory Visit: Payer: Self-pay | Admitting: Internal Medicine

## 2008-03-16 LAB — CONVERTED CEMR LAB

## 2008-03-25 ENCOUNTER — Ambulatory Visit: Payer: Self-pay | Admitting: Internal Medicine

## 2008-03-25 ENCOUNTER — Encounter: Payer: Self-pay | Admitting: *Deleted

## 2008-03-25 ENCOUNTER — Encounter: Payer: Self-pay | Admitting: Internal Medicine

## 2008-03-25 ENCOUNTER — Inpatient Hospital Stay (HOSPITAL_COMMUNITY): Admission: EM | Admit: 2008-03-25 | Discharge: 2008-03-27 | Payer: Self-pay | Admitting: Emergency Medicine

## 2008-03-25 DIAGNOSIS — N3941 Urge incontinence: Secondary | ICD-10-CM | POA: Insufficient documentation

## 2008-03-25 HISTORY — DX: Urge incontinence: N39.41

## 2008-03-25 LAB — CONVERTED CEMR LAB
ALT: 14 units/L
AST: 19 units/L

## 2008-03-26 ENCOUNTER — Encounter: Payer: Self-pay | Admitting: *Deleted

## 2008-03-28 ENCOUNTER — Telehealth (INDEPENDENT_AMBULATORY_CARE_PROVIDER_SITE_OTHER): Payer: Self-pay | Admitting: Internal Medicine

## 2008-03-28 ENCOUNTER — Ambulatory Visit: Payer: Self-pay | Admitting: Internal Medicine

## 2008-03-28 ENCOUNTER — Encounter: Payer: Self-pay | Admitting: Internal Medicine

## 2008-03-29 ENCOUNTER — Encounter (INDEPENDENT_AMBULATORY_CARE_PROVIDER_SITE_OTHER): Payer: Self-pay | Admitting: Internal Medicine

## 2008-03-30 ENCOUNTER — Telehealth: Payer: Self-pay | Admitting: *Deleted

## 2008-03-30 ENCOUNTER — Encounter (INDEPENDENT_AMBULATORY_CARE_PROVIDER_SITE_OTHER): Payer: Self-pay | Admitting: Internal Medicine

## 2008-03-30 ENCOUNTER — Ambulatory Visit: Payer: Self-pay | Admitting: *Deleted

## 2008-04-03 ENCOUNTER — Ambulatory Visit: Payer: Self-pay | Admitting: Internal Medicine

## 2008-04-03 LAB — CONVERTED CEMR LAB

## 2008-04-04 ENCOUNTER — Encounter (INDEPENDENT_AMBULATORY_CARE_PROVIDER_SITE_OTHER): Payer: Self-pay | Admitting: Internal Medicine

## 2008-04-06 ENCOUNTER — Encounter: Payer: Self-pay | Admitting: Pulmonary Disease

## 2008-04-10 ENCOUNTER — Ambulatory Visit: Payer: Self-pay | Admitting: Internal Medicine

## 2008-04-10 LAB — CONVERTED CEMR LAB: INR: 5.8

## 2008-04-11 ENCOUNTER — Encounter (INDEPENDENT_AMBULATORY_CARE_PROVIDER_SITE_OTHER): Payer: Self-pay | Admitting: Internal Medicine

## 2008-04-11 ENCOUNTER — Ambulatory Visit: Payer: Self-pay | Admitting: Cardiovascular Disease

## 2008-04-11 ENCOUNTER — Encounter: Payer: Self-pay | Admitting: Pulmonary Disease

## 2008-04-12 ENCOUNTER — Telehealth: Payer: Self-pay | Admitting: *Deleted

## 2008-05-01 ENCOUNTER — Ambulatory Visit: Payer: Self-pay | Admitting: Internal Medicine

## 2008-05-01 ENCOUNTER — Encounter: Payer: Self-pay | Admitting: Internal Medicine

## 2008-05-01 DIAGNOSIS — H9209 Otalgia, unspecified ear: Secondary | ICD-10-CM | POA: Insufficient documentation

## 2008-05-01 LAB — CONVERTED CEMR LAB
Hemoglobin: 10.8 g/dL — ABNORMAL LOW (ref 12.0–15.0)
RBC: 4.01 M/uL (ref 3.87–5.11)
WBC: 8.1 10*3/uL (ref 4.0–10.5)

## 2008-05-04 ENCOUNTER — Ambulatory Visit: Payer: Self-pay | Admitting: Pulmonary Disease

## 2008-05-05 DIAGNOSIS — J209 Acute bronchitis, unspecified: Secondary | ICD-10-CM

## 2008-05-08 ENCOUNTER — Ambulatory Visit: Payer: Self-pay | Admitting: Internal Medicine

## 2008-05-10 ENCOUNTER — Encounter (INDEPENDENT_AMBULATORY_CARE_PROVIDER_SITE_OTHER): Payer: Self-pay | Admitting: Internal Medicine

## 2008-05-17 ENCOUNTER — Encounter (INDEPENDENT_AMBULATORY_CARE_PROVIDER_SITE_OTHER): Payer: Self-pay | Admitting: Internal Medicine

## 2008-05-22 ENCOUNTER — Ambulatory Visit: Payer: Self-pay | Admitting: Internal Medicine

## 2008-05-22 LAB — CONVERTED CEMR LAB: INR: 5

## 2008-06-08 ENCOUNTER — Encounter (INDEPENDENT_AMBULATORY_CARE_PROVIDER_SITE_OTHER): Payer: Self-pay | Admitting: *Deleted

## 2008-06-08 ENCOUNTER — Ambulatory Visit: Payer: Self-pay | Admitting: *Deleted

## 2008-06-08 LAB — CONVERTED CEMR LAB: INR: 2.3

## 2008-06-20 ENCOUNTER — Encounter (INDEPENDENT_AMBULATORY_CARE_PROVIDER_SITE_OTHER): Payer: Self-pay | Admitting: Internal Medicine

## 2008-06-29 ENCOUNTER — Telehealth: Payer: Self-pay | Admitting: *Deleted

## 2008-07-04 ENCOUNTER — Ambulatory Visit: Payer: Self-pay | Admitting: Internal Medicine

## 2008-07-05 ENCOUNTER — Encounter: Payer: Self-pay | Admitting: *Deleted

## 2008-07-05 ENCOUNTER — Encounter (INDEPENDENT_AMBULATORY_CARE_PROVIDER_SITE_OTHER): Payer: Self-pay | Admitting: Internal Medicine

## 2008-07-08 ENCOUNTER — Encounter (INDEPENDENT_AMBULATORY_CARE_PROVIDER_SITE_OTHER): Payer: Self-pay | Admitting: Emergency Medicine

## 2008-07-08 ENCOUNTER — Encounter (INDEPENDENT_AMBULATORY_CARE_PROVIDER_SITE_OTHER): Payer: Self-pay | Admitting: Internal Medicine

## 2008-07-08 ENCOUNTER — Inpatient Hospital Stay (HOSPITAL_COMMUNITY): Admission: EM | Admit: 2008-07-08 | Discharge: 2008-07-12 | Payer: Self-pay | Admitting: Emergency Medicine

## 2008-07-08 ENCOUNTER — Ambulatory Visit: Payer: Self-pay | Admitting: Infectious Diseases

## 2008-07-08 ENCOUNTER — Ambulatory Visit: Payer: Self-pay | Admitting: Vascular Surgery

## 2008-07-08 ENCOUNTER — Encounter: Payer: Self-pay | Admitting: Internal Medicine

## 2008-07-10 ENCOUNTER — Encounter (INDEPENDENT_AMBULATORY_CARE_PROVIDER_SITE_OTHER): Payer: Self-pay | Admitting: Internal Medicine

## 2008-07-12 ENCOUNTER — Encounter: Payer: Self-pay | Admitting: Internal Medicine

## 2008-07-19 ENCOUNTER — Ambulatory Visit: Payer: Self-pay | Admitting: Internal Medicine

## 2008-07-19 LAB — CONVERTED CEMR LAB
HCT: 33.9 % — ABNORMAL LOW (ref 36.0–46.0)
Hemoglobin: 10.4 g/dL — ABNORMAL LOW (ref 12.0–15.0)
MCHC: 30.7 g/dL (ref 30.0–36.0)
MCV: 78.9 fL (ref 78.0–100.0)
RBC: 4.3 M/uL (ref 3.87–5.11)
RDW: 26 % — ABNORMAL HIGH (ref 11.5–15.5)

## 2008-07-20 ENCOUNTER — Encounter (INDEPENDENT_AMBULATORY_CARE_PROVIDER_SITE_OTHER): Payer: Self-pay | Admitting: Internal Medicine

## 2008-07-24 ENCOUNTER — Encounter (INDEPENDENT_AMBULATORY_CARE_PROVIDER_SITE_OTHER): Payer: Self-pay | Admitting: Internal Medicine

## 2008-07-26 ENCOUNTER — Telehealth (INDEPENDENT_AMBULATORY_CARE_PROVIDER_SITE_OTHER): Payer: Self-pay | Admitting: Internal Medicine

## 2008-07-31 ENCOUNTER — Encounter (INDEPENDENT_AMBULATORY_CARE_PROVIDER_SITE_OTHER): Payer: Self-pay | Admitting: Internal Medicine

## 2008-08-01 ENCOUNTER — Encounter: Payer: Self-pay | Admitting: Internal Medicine

## 2008-08-01 ENCOUNTER — Ambulatory Visit: Payer: Self-pay | Admitting: Internal Medicine

## 2008-08-01 DIAGNOSIS — K31819 Angiodysplasia of stomach and duodenum without bleeding: Secondary | ICD-10-CM | POA: Insufficient documentation

## 2008-08-01 DIAGNOSIS — M1990 Unspecified osteoarthritis, unspecified site: Secondary | ICD-10-CM | POA: Insufficient documentation

## 2008-08-01 LAB — CONVERTED CEMR LAB
MCHC: 31.9 g/dL (ref 30.0–36.0)
MCV: 82.5 fL (ref 78.0–100.0)
Platelets: 318 10*3/uL (ref 150–400)
RBC: 4.55 M/uL (ref 3.87–5.11)
RDW: 28.8 % — ABNORMAL HIGH (ref 11.5–15.5)

## 2008-08-02 ENCOUNTER — Encounter (INDEPENDENT_AMBULATORY_CARE_PROVIDER_SITE_OTHER): Payer: Self-pay | Admitting: Internal Medicine

## 2008-08-04 ENCOUNTER — Ambulatory Visit (HOSPITAL_COMMUNITY): Admission: RE | Admit: 2008-08-04 | Discharge: 2008-08-04 | Payer: Self-pay | Admitting: Gastroenterology

## 2008-08-15 ENCOUNTER — Encounter (INDEPENDENT_AMBULATORY_CARE_PROVIDER_SITE_OTHER): Payer: Self-pay | Admitting: Internal Medicine

## 2008-08-22 ENCOUNTER — Encounter (INDEPENDENT_AMBULATORY_CARE_PROVIDER_SITE_OTHER): Payer: Self-pay | Admitting: Internal Medicine

## 2008-09-05 ENCOUNTER — Encounter (INDEPENDENT_AMBULATORY_CARE_PROVIDER_SITE_OTHER): Payer: Self-pay | Admitting: Internal Medicine

## 2008-09-07 ENCOUNTER — Ambulatory Visit (HOSPITAL_COMMUNITY): Admission: RE | Admit: 2008-09-07 | Discharge: 2008-09-07 | Payer: Self-pay | Admitting: Gastroenterology

## 2008-09-22 ENCOUNTER — Encounter (INDEPENDENT_AMBULATORY_CARE_PROVIDER_SITE_OTHER): Payer: Self-pay | Admitting: Internal Medicine

## 2008-09-22 ENCOUNTER — Encounter: Payer: Self-pay | Admitting: Internal Medicine

## 2008-09-22 ENCOUNTER — Ambulatory Visit (HOSPITAL_COMMUNITY): Admission: RE | Admit: 2008-09-22 | Discharge: 2008-09-22 | Payer: Self-pay | Admitting: Internal Medicine

## 2008-09-22 ENCOUNTER — Ambulatory Visit: Payer: Self-pay | Admitting: Internal Medicine

## 2008-09-22 ENCOUNTER — Ambulatory Visit: Payer: Self-pay | Admitting: Vascular Surgery

## 2008-09-22 LAB — CONVERTED CEMR LAB
MCV: 86.3 fL (ref >=78.0)
Platelets: 313 10*3/uL (ref 150–400)
WBC: 8 10*3/uL (ref 4.0–10.5)

## 2008-10-05 ENCOUNTER — Encounter: Payer: Self-pay | Admitting: Internal Medicine

## 2008-10-10 ENCOUNTER — Ambulatory Visit: Payer: Self-pay | Admitting: Infectious Disease

## 2008-10-23 ENCOUNTER — Ambulatory Visit: Payer: Self-pay | Admitting: Internal Medicine

## 2008-11-27 DIAGNOSIS — G5600 Carpal tunnel syndrome, unspecified upper limb: Secondary | ICD-10-CM

## 2008-11-29 ENCOUNTER — Inpatient Hospital Stay (HOSPITAL_COMMUNITY): Admission: EM | Admit: 2008-11-29 | Discharge: 2008-12-01 | Payer: Self-pay | Admitting: Emergency Medicine

## 2008-11-29 ENCOUNTER — Ambulatory Visit: Payer: Self-pay | Admitting: Cardiology

## 2008-11-29 ENCOUNTER — Ambulatory Visit: Payer: Self-pay | Admitting: Internal Medicine

## 2008-11-30 ENCOUNTER — Encounter (INDEPENDENT_AMBULATORY_CARE_PROVIDER_SITE_OTHER): Payer: Self-pay | Admitting: Internal Medicine

## 2008-11-30 ENCOUNTER — Ambulatory Visit: Payer: Self-pay | Admitting: Vascular Surgery

## 2008-12-01 ENCOUNTER — Encounter (INDEPENDENT_AMBULATORY_CARE_PROVIDER_SITE_OTHER): Payer: Self-pay | Admitting: Internal Medicine

## 2008-12-01 DIAGNOSIS — N390 Urinary tract infection, site not specified: Secondary | ICD-10-CM

## 2008-12-11 ENCOUNTER — Ambulatory Visit: Payer: Self-pay | Admitting: Internal Medicine

## 2008-12-27 ENCOUNTER — Encounter: Payer: Self-pay | Admitting: Internal Medicine

## 2009-01-24 ENCOUNTER — Encounter: Payer: Self-pay | Admitting: Internal Medicine

## 2009-01-29 ENCOUNTER — Encounter: Payer: Self-pay | Admitting: Internal Medicine

## 2009-02-19 ENCOUNTER — Encounter: Payer: Self-pay | Admitting: Internal Medicine

## 2009-02-26 ENCOUNTER — Encounter: Payer: Self-pay | Admitting: Internal Medicine

## 2009-03-01 ENCOUNTER — Encounter: Payer: Self-pay | Admitting: Internal Medicine

## 2009-03-14 ENCOUNTER — Ambulatory Visit: Payer: Self-pay | Admitting: Internal Medicine

## 2009-03-14 LAB — CONVERTED CEMR LAB
ALT: <8 units/L (ref 0–35)
CO2: 34 meq/L — ABNORMAL HIGH (ref 19–32)
Calcium: 9.1 mg/dL (ref 8.4–10.5)
Chloride: 104 meq/L (ref 96–112)
Creatinine, Ser: 0.67 mg/dL (ref 0.40–1.20)
Glucose, Bld: 99 mg/dL (ref 70–99)
HCT: 44.3 % (ref 36.0–46.0)
Platelets: 321 10*3/uL (ref 150–400)
RBC: 4.87 M/uL (ref 3.87–5.11)
Total Protein: 6.5 g/dL (ref 6.0–8.3)
WBC: 7.1 10*3/uL (ref 4.0–10.5)

## 2009-04-11 ENCOUNTER — Telehealth: Payer: Self-pay | Admitting: Internal Medicine

## 2009-04-26 ENCOUNTER — Encounter: Payer: Self-pay | Admitting: Internal Medicine

## 2009-04-30 ENCOUNTER — Encounter: Payer: Self-pay | Admitting: Internal Medicine

## 2009-05-02 ENCOUNTER — Ambulatory Visit: Payer: Self-pay | Admitting: Internal Medicine

## 2009-05-03 ENCOUNTER — Encounter: Payer: Self-pay | Admitting: Internal Medicine

## 2009-05-03 LAB — CONVERTED CEMR LAB
ALT: 8 units/L (ref 0–35)
AST: 12 units/L (ref 0–37)
Albumin: 4 g/dL (ref 3.5–5.2)
Calcium: 8.8 mg/dL (ref 8.4–10.5)
Chloride: 103 meq/L (ref 96–112)
Lymphocytes Relative: 21 % (ref 12–46)
Lymphs Abs: 1.5 10*3/uL (ref 0.7–4.0)
Neutrophils Relative %: 71 % (ref 43–77)
Platelets: 335 10*3/uL (ref 150–400)
Potassium: 4 meq/L (ref 3.5–5.3)
Protein, ur: NEGATIVE mg/dL
Sodium: 141 meq/L (ref 135–145)
Total CHOL/HDL Ratio: 4.1
Urine Glucose: NEGATIVE mg/dL
WBC: 7.5 10*3/uL (ref 4.0–10.5)
pH: 7 (ref 5.0–8.0)

## 2009-05-18 ENCOUNTER — Ambulatory Visit: Payer: Self-pay | Admitting: Internal Medicine

## 2009-06-01 ENCOUNTER — Encounter: Payer: Self-pay | Admitting: Internal Medicine

## 2009-07-05 ENCOUNTER — Encounter: Payer: Self-pay | Admitting: Internal Medicine

## 2009-07-14 ENCOUNTER — Ambulatory Visit: Payer: Self-pay | Admitting: Internal Medicine

## 2009-07-14 ENCOUNTER — Encounter: Payer: Self-pay | Admitting: Internal Medicine

## 2009-07-14 ENCOUNTER — Inpatient Hospital Stay (HOSPITAL_COMMUNITY): Admission: EM | Admit: 2009-07-14 | Discharge: 2009-07-16 | Payer: Self-pay | Admitting: Emergency Medicine

## 2009-07-15 ENCOUNTER — Encounter: Payer: Self-pay | Admitting: Internal Medicine

## 2009-07-15 ENCOUNTER — Ambulatory Visit: Payer: Self-pay | Admitting: Surgery

## 2009-07-16 ENCOUNTER — Encounter: Payer: Self-pay | Admitting: Internal Medicine

## 2009-07-27 ENCOUNTER — Telehealth: Payer: Self-pay | Admitting: Internal Medicine

## 2009-07-31 ENCOUNTER — Encounter: Payer: Self-pay | Admitting: Internal Medicine

## 2009-07-31 ENCOUNTER — Ambulatory Visit: Payer: Self-pay | Admitting: Internal Medicine

## 2009-08-09 ENCOUNTER — Encounter: Payer: Self-pay | Admitting: Internal Medicine

## 2009-08-14 ENCOUNTER — Encounter: Payer: Self-pay | Admitting: Internal Medicine

## 2009-08-21 ENCOUNTER — Encounter: Payer: Self-pay | Admitting: Internal Medicine

## 2009-08-29 ENCOUNTER — Emergency Department (HOSPITAL_COMMUNITY): Admission: EM | Admit: 2009-08-29 | Discharge: 2009-08-30 | Payer: Self-pay | Admitting: Emergency Medicine

## 2009-08-31 ENCOUNTER — Telehealth: Payer: Self-pay | Admitting: *Deleted

## 2009-09-04 ENCOUNTER — Encounter: Payer: Self-pay | Admitting: Internal Medicine

## 2009-09-04 ENCOUNTER — Ambulatory Visit: Payer: Self-pay | Admitting: Internal Medicine

## 2009-09-04 DIAGNOSIS — R1084 Generalized abdominal pain: Secondary | ICD-10-CM

## 2009-09-05 LAB — CONVERTED CEMR LAB
Albumin: 4.1 g/dL (ref 3.5–5.2)
Alkaline Phosphatase: 116 units/L (ref 39–117)
BUN: 9 mg/dL (ref 6–23)
CO2: 30 meq/L (ref 19–32)
Calcium: 8.6 mg/dL (ref 8.4–10.5)
Casts: NONE SEEN /lpf
Glucose, Bld: 109 mg/dL — ABNORMAL HIGH (ref 70–99)
Hemoglobin: 11.8 g/dL — ABNORMAL LOW (ref 12.0–15.0)
Lipase: 11 units/L (ref 0–75)
MCHC: 29.2 g/dL — ABNORMAL LOW (ref 30.0–36.0)
Nitrite: POSITIVE — AB
Potassium: 3.8 meq/L (ref 3.5–5.3)
RBC: 4.68 M/uL (ref 3.87–5.11)
Specific Gravity, Urine: 1.021 (ref 1.005–1.0)
WBC: 8 10*3/uL (ref 4.0–10.5)
pH: 6.5 (ref 5.0–8.0)

## 2009-10-16 ENCOUNTER — Emergency Department (HOSPITAL_COMMUNITY): Admission: EM | Admit: 2009-10-16 | Discharge: 2009-10-16 | Payer: Self-pay | Admitting: Emergency Medicine

## 2009-10-22 ENCOUNTER — Ambulatory Visit: Payer: Self-pay | Admitting: Internal Medicine

## 2009-11-09 ENCOUNTER — Encounter: Payer: Self-pay | Admitting: Internal Medicine

## 2010-01-01 ENCOUNTER — Ambulatory Visit: Payer: Self-pay | Admitting: Internal Medicine

## 2010-01-02 ENCOUNTER — Telehealth: Payer: Self-pay | Admitting: *Deleted

## 2010-01-15 ENCOUNTER — Encounter: Payer: Self-pay | Admitting: Internal Medicine

## 2010-01-25 ENCOUNTER — Ambulatory Visit: Admission: RE | Admit: 2010-01-25 | Discharge: 2010-01-25 | Payer: Self-pay | Source: Home / Self Care

## 2010-01-25 LAB — CONVERTED CEMR LAB
Albumin: 4.1 g/dL (ref 3.5–5.2)
BUN: 8 mg/dL (ref 6–23)
CO2: 29 meq/L (ref 19–32)
Calcium: 9 mg/dL (ref 8.4–10.5)
Chloride: 102 meq/L (ref 96–112)
Glucose, Bld: 119 mg/dL — ABNORMAL HIGH (ref 70–99)
Hemoglobin: 12.1 g/dL (ref 12.0–15.0)
Ketones, urine, test strip: NEGATIVE
Lymphocytes Relative: 25 % (ref 12–46)
Monocytes Absolute: 0.6 10*3/uL (ref 0.1–1.0)
Monocytes Relative: 9 % (ref 3–12)
Neutro Abs: 4 10*3/uL (ref 1.7–7.7)
Nitrite: POSITIVE
Potassium: 4.2 meq/L (ref 3.5–5.3)
RBC: 5.15 M/uL — ABNORMAL HIGH (ref 3.87–5.11)
WBC: 6.3 10*3/uL (ref 4.0–10.5)
pH: 5

## 2010-02-08 ENCOUNTER — Encounter: Payer: Self-pay | Admitting: Internal Medicine

## 2010-02-09 ENCOUNTER — Encounter: Payer: Self-pay | Admitting: Internal Medicine

## 2010-02-21 NOTE — Miscellaneous (Signed)
Summary: [facility] C[facility]  [facility]IF[facility]   Imported By: Shon Hough 02/01/2010 11:13:40  _____________________________________________________________________  External Attachment:    Type:   Image     Comment:   External Document

## 2010-02-21 NOTE — Miscellaneous (Signed)
Summary: [facility] REVISION TO PLAN OF CARE  [facility] REVISION TO PLAN OF CARE   Imported By: Shon Hough 08/09/2009 15:39:27  _____________________________________________________________________  External Attachment:    Type:   Image     Comment:   External Document
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
## 2010-02-21 NOTE — Miscellaneous (Signed)
Summary: [facility]: Home Health Cert.& Plan Of Care  [facility]: [facility] Health Cert.& Plan Of Care   Imported By: Florinda Marker 01/24/2009 16:04:31  _____________________________________________________________________  External Attachment:    Type:   Image     Comment:   External Document
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
## 2010-02-21 NOTE — Miscellaneous (Signed)
Summary: [facility]   [facility]   Imported By: Shon Hough 09/25/2009 11:01:29  _____________________________________________________________________  External Attachment:    Type:   Image     Comment:   External Document

## 2010-02-21 NOTE — Letter (Signed)
Summary: [facility] [employer] [facility]  [facility]   Imported By: Margie Billet 05/22/2009 14:11:39  _____________________________________________________________________  External Attachment:    Type:   Image     Comment:   External Document
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
## 2010-02-21 NOTE — Miscellaneous (Signed)
Summary: [facility] CERTIFICATION AND PLAN OF CARE  [facility] CERTIFICATION AND PLAN OF CARE   Imported By: Shon Hough 08/27/2009 16:47:30  _____________________________________________________________________  External Attachment:    Type:   Image     Comment:   External Document

## 2010-02-21 NOTE — Assessment & Plan Note (Signed)
Summary: FLU SHOT ONLY/CH  Pt was here to be seen by Dr Scot Dock as Dr Clabe Seal was canceled, but pt refused to see anyone else.[facility]am[facility])  October 23, 2009 11:35 AM    Allergies: 1)  ! Sulfa 2)  ! Thorazine 3)  ! Codeine 4)  ! Morphine  Orders Added: 1)  Admin 1st Vaccine [90471] 2)  Flu Vaccine 52yrs + [28413] Flu Vaccine Consent Questions     Do you have a history of severe allergic reactions to this vaccine? no    Any prior history of allergic reactions to egg and/or gelatin? no    Do you have a sensitivity to the preservative Thimersol? no    Do you have a past history of Guillan-Barre Syndrome? no    Do you currently have an acute febrile illness? no    Have you ever had a severe reaction to latex? no    Vaccine information given and explained to patient? yes    Are you currently pregnant? no    Lot Number:[id number]   Exp Date:07/20/2010   Manufacturer: [employer]    Site Given  right Deltoid IM.Cynda Familia [facility])  October 22, 2009 2:33 PM  .opcflu

## 2010-02-21 NOTE — Discharge Summary (Signed)
Summary: Hospital Discharge Update    Hospital Discharge Update:  Date of Admission: 07/14/2009 Date of Discharge: 07/16/2009  Brief Summary:  1. Deconditioning, severe 2. COPD --destas down to 78%; D/C-ed with O2 via [place] at 2L/min 3. CHF, mild --noncompliant with Lasix 4. Dizziness, resolved. 5. LBP, chronic 5. Depression, severe. 6. Bcx x1 positive Fro Gr+in clusters; likely a contaminant.  Other follow-up issues:  Patient might need to see a psychiatrist and have her medication regimen reevaluated.  Medication list changes:  Removed medication of BACTRIM DS 800-160 MG TABS (SULFAMETHOXAZOLE-TRIMETHOPRIM) Take 1 tablet by mouth two times a day - Signed  The medication, problem, and allergy lists have been updated.  Please see the dictated discharge summary for details.  Discharge medications:  PRAVASTATIN SODIUM 80 MG TABS (PRAVASTATIN SODIUM) Take 1 tablet by mouth once a day LAMICTAL 100 MG TABS (LAMOTRIGINE) Take 1 tablet by mouth once a day LORAZEPAM 1 MG TABS (LORAZEPAM) Take one tablet by mouth every morning and two tablets by mouth at bedtime ZOLOFT 100 MG TABS (SERTRALINE HCL) Take two tablets by mouth every morning SEROQUEL 200 MG TABS (QUETIAPINE FUMARATE) Take 1 tab by mouth at bedtime NEURONTIN 100 MG CAPS (GABAPENTIN) Take 1 tablet by mouth three times a day PROVENTIL HFA 108 (90 BASE) MCG/ACT  AERS (ALBUTEROL SULFATE) One to two puffs every four hours as needed. PERCOCET 10-325 MG TABS (OXYCODONE-ACETAMINOPHEN) Take 1 tablet by mouth every 6 hours as needed for pain. OMEPRAZOLE 20 MG CPDR (OMEPRAZOLE) Take one capsule by mouth once daily IRON 325 (65 FE) MG TABS (FERROUS SULFATE) Take 1 tablet by mouth three times a day FUROSEMIDE 40 MG TABS (FUROSEMIDE) Take 1 tablet by mouth once a day as needed for swelling  Other patient instructions:  Ms. Lissa Hoard at [facility] outpatient clinic will call you with your follow up appointment. Call the clinic with any questions.  Please, take all your medications as prescribed.  Note: Hospital Discharge Medications & Other Instructions handout was printed, one copy for patient and a second copy to be placed in hospital chart.

## 2010-02-21 NOTE — Progress Notes (Signed)
Summary: phone/gg  Phone Note From Other Clinic   Summary of Call: Call from [facility] with [facility] [phone] )   stating pt has a Rx  ALBUTEROL SULFATE (2.5 MG/3ML) 0.083% NEBU (ALBUTEROL SULFATE) use every 12 hours as needed for pain  #1 pack x 5 but pt does not have a nebulizer machine at home.  Did you want to order one for her or did you mean to write for a HHN? # to fax if ordering a home nebulizer -  [phone]  Do you plan to see this pt on July 12th????  She is not on your schedule.  She states she is tocome in at 1300????? Initial call taken by: Merrie Roof RN,  July 27, 2009 11:35 AM  Follow-up for Phone Call        yes I will see her today if that is OK Thank you Trini Soldo I already cleared up the confusion on the albuterol, it was supposed to be for shortness of breath Thanks Leida Luton Follow-up by: Mliss Sax MD,  July 31, 2009 9:41 AM

## 2010-02-21 NOTE — Miscellaneous (Signed)
Summary: [facility]   Imported By: Margie Billet 04/20/2009 16:04:33  _____________________________________________________________________  External Attachment:    Type:   Image     Comment:   External Document

## 2010-02-21 NOTE — Miscellaneous (Signed)
Summary: [facility]: Home Health Cert. & Plan Of Care  [facility]: Home Health Cert. & Plan Of Care   Imported By: Florinda Marker 03/01/2009 14:36:02  _____________________________________________________________________  External Attachment:    Type:   Image     Comment:   External Document

## 2010-02-21 NOTE — Consult Note (Signed)
[facility] [place]   Imported By: Margie Billet 09/20/2009 09:12:13  _____________________________________________________________________  External Attachment:    Type:   Image     Comment:   External Document

## 2010-02-21 NOTE — Letter (Signed)
Summary: [facility]   [facility]   Imported By: Margie Billet 06/01/2009 11:09:43  _____________________________________________________________________  External Attachment:    Type:   Image     Comment:   External Document

## 2010-02-21 NOTE — Progress Notes (Signed)
Summary: appt cancel/ hla  Phone Note Call from Patient   Summary of Call: pt called to cancel today's appt, resch for tues for ED f/u. she says she is no better, but declines today appt, she is cautioned that if she becomes worse or feels she cannot wait for appt time, she may go to ed or urg care, verb understanding Initial call taken by: Marin Roberts RN,  September 12, 2009 3:32 PM

## 2010-02-21 NOTE — Miscellaneous (Signed)
Summary: [facility] CERTIFICATION AND [facility]AN [facility] CARE  [facility]IF[facility]   Imported By: Shon Hough 08/16/2009 16:34:52  _____________________________________________________________________  External Attachment:    Type:   Image     Comment:   External Document
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
## 2010-02-21 NOTE — Medication Information (Signed)
Summary: Tax adviser   Imported By: Louretta Parma 08/02/2009 11:59:46  _____________________________________________________________________  External Attachment:    Type:   Image     Comment:   External Document

## 2010-02-21 NOTE — Miscellaneous (Signed)
Summary: [facility]: Home Health Cert. & Plan Of Care  [facility]: Home Health Cert. & Plan Of Care   Imported By: Florinda Marker 01/29/2009 14:38:46  _____________________________________________________________________  External Attachment:    Type:   Image     Comment:   External Document

## 2010-02-21 NOTE — Miscellaneous (Signed)
Summary: [facility] CERT[facility]  [facility]ERT[facility]   Imported By: Shon Hough 11/14/2009 10:05:31  _____________________________________________________________________  External Attachment:    Type:   Image     Comment:   External Document

## 2010-02-21 NOTE — Miscellaneous (Signed)
Summary: [facility]: Revision To Plan  [facility]: Revision To Plan   Imported By: Florinda Marker 01/24/2009 16:02:43  _____________________________________________________________________  External Attachment:    Type:   Image     Comment:   External Document

## 2010-02-21 NOTE — Miscellaneous (Signed)
Summary[facility]   Imported By: Margie Billet 06/26/2009 13:45:23  _____________________________________________________________________  External Attachment:    Type:   Image     Comment:   External Document

## 2010-02-21 NOTE — Miscellaneous (Signed)
Summary[facility]   Imported By: Margie Billet 02/15/2010 16:12:43  _____________________________________________________________________  External Attachment:    Type:   Image     Comment:   External Document

## 2010-02-21 NOTE — Miscellaneous (Signed)
Summary: [facility]   [facility]   Imported By: Margie Billet 05/18/2009 15:06:09  _____________________________________________________________________  External Attachment:    Type:   Image     Comment:   External Document

## 2010-02-21 NOTE — Letter (Signed)
Summary: [facility]  [facility]   Imported By: Shon Hough 08/29/2009 10:35:01  _____________________________________________________________________  External Attachment:    Type:   Image     Comment:   External Document
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
## 2010-02-21 NOTE — Miscellaneous (Signed)
Summary: [facility]   Imported By: Margie Billet 07/11/2009 13:45:34  _____________________________________________________________________  External Attachment:    Type:   Image     Comment:   External Document

## 2010-02-21 NOTE — Progress Notes (Signed)
Summary: meds called to pharm/ [facility]  Phone Note Call from Patient   Summary of Call: pt called stating her meds were not at pharm, called zithromax and [facility]rovent [facility]  Initial call taken by: Marin Roberts RN,  January 02, 2010 10:28 AM

## 2010-02-21 NOTE — Progress Notes (Signed)
Summary: refill/gg  Phone Note Refill Request  on April 11, 2009 1:40 PM  medicaid will not pay for lipitor, can yo change to something they will cover?   Method Requested: Electronic Initial call taken by: Merrie Roof RN,  April 11, 2009 2:01 PM  Follow-up for Phone Call        Will try pravastatin. Rx completed in Dr. Tiajuana Amass Follow-up by: Jackson Latino MD,  April 12, 2009 9:42 AM    New/Updated Medications: PRAVASTATIN SODIUM 80 MG TABS (PRAVASTATIN SODIUM) Take 1 tablet by mouth once a day Prescriptions: PRAVASTATIN SODIUM 80 MG TABS (PRAVASTATIN SODIUM) Take 1 tablet by mouth once a day  #31 x 6   Entered and Authorized by:   Jackson Latino MD   Signed by:   Jackson Latino MD on 04/12/2009   Method used:   Electronically to        [facility]  [address] [facility]* (retail)       [address]       [place], [place]  [address]       Ph: [phone] or [phone]       Fax: [phone]   RxID:   [id number]

## 2010-02-21 NOTE — Letter (Signed)
Summary: [facility]   Imported By: Shon Hough 08/29/2009 10:30:27  _____________________________________________________________________  External Attachment:    Type:   Image     Comment:   External Document
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
## 2010-03-11 ENCOUNTER — Telehealth: Payer: Self-pay | Admitting: *Deleted

## 2010-03-11 ENCOUNTER — Ambulatory Visit: Payer: Self-pay | Admitting: Internal Medicine

## 2010-03-11 NOTE — Telephone Encounter (Signed)
Call from pt said that she has had recent hand surgery and will need to reschedule her appointment with Dr. Aldine Contes.

## 2010-03-14 ENCOUNTER — Encounter: Payer: Self-pay | Admitting: Internal Medicine

## 2010-04-04 LAB — CBC
HCT: 38.9 % (ref 36.0–46.0)
Hemoglobin: 11.4 g/dL — ABNORMAL LOW (ref 12.0–15.0)
MCH: 24.7 pg — ABNORMAL LOW (ref 26.0–34.0)
MCHC: 29.3 g/dL — ABNORMAL LOW (ref 30.0–36.0)
MCV: 84.4 fL (ref 78.0–100.0)
Platelets: 340 10*3/uL (ref 150–400)
RBC: 4.61 MIL/uL (ref 3.87–5.11)
RDW: 18.1 % — ABNORMAL HIGH (ref 11.5–15.5)
WBC: 8.5 10*3/uL (ref 4.0–10.5)

## 2010-04-04 LAB — URINALYSIS, ROUTINE W REFLEX MICROSCOPIC
Glucose, UA: NEGATIVE mg/dL
Nitrite: NEGATIVE
Protein, ur: NEGATIVE mg/dL
pH: 5 (ref 5.0–8.0)

## 2010-04-04 LAB — BASIC METABOLIC PANEL
BUN: 7 mg/dL (ref 6–23)
CO2: 33 mEq/L — ABNORMAL HIGH (ref 19–32)
Calcium: 9.1 mg/dL (ref 8.4–10.5)
Chloride: 103 mEq/L (ref 96–112)
Creatinine, Ser: 0.76 mg/dL (ref 0.4–1.2)
GFR calc Af Amer: >60 mL/min (ref >60)
GFR calc non Af Amer: >60 mL/min (ref >60)
Glucose, Bld: 109 mg/dL — ABNORMAL HIGH (ref 70–99)
Potassium: 4.9 mEq/L (ref 3.5–5.1)
Sodium: 143 mEq/L (ref 135–145)

## 2010-04-04 LAB — DIFFERENTIAL
Basophils Absolute: 0 K/uL (ref 0.0–0.1)
Basophils Relative: 0 % (ref 0–1)
Eosinophils Absolute: 0.2 10*3/uL (ref 0.0–0.7)
Eosinophils Relative: 2 % (ref 0–5)
Lymphocytes Relative: 15 % (ref 12–46)
Lymphs Abs: 1.3 10*3/uL (ref 0.7–4.0)
Monocytes Absolute: 0.6 K/uL (ref 0.1–1.0)
Monocytes Relative: 7 % (ref 3–12)
Neutro Abs: 6.4 K/uL (ref 1.7–7.7)
Neutrophils Relative %: 75 % (ref 43–77)

## 2010-04-04 LAB — POCT CARDIAC MARKERS
Myoglobin, poc: 55.8 ng/mL (ref 12–200)
Troponin i, poc: <0.05 ng/mL (ref 0.00–0.09)

## 2010-04-04 LAB — URINE CULTURE: Colony Count: 4000

## 2010-04-05 LAB — POCT I-STAT, CHEM 8
BUN: 7 mg/dL (ref 6–23)
HCT: 38 % (ref 36.0–46.0)
Hemoglobin: 12.9 g/dL (ref 12.0–15.0)
Sodium: 142 mEq/L (ref 135–145)
TCO2: 31 mmol/L (ref 0–100)

## 2010-04-05 LAB — POCT CARDIAC MARKERS: Myoglobin, poc: 39.7 ng/mL (ref 12–200)

## 2010-04-05 LAB — BRAIN NATRIURETIC PEPTIDE: Pro B Natriuretic peptide (BNP): 94 pg/mL (ref 0.0–100.0)

## 2010-04-07 LAB — CARDIAC PANEL(CRET KIN+CKTOT+MB+TROPI)
CK, MB: 0.7 ng/mL (ref 0.3–4.0)
CK, MB: 0.8 ng/mL (ref 0.3–4.0)
CK, MB: 0.8 ng/mL (ref 0.3–4.0)
Relative Index: INVALID (ref 0.0–2.5)
Relative Index: INVALID (ref 0.0–2.5)
Total CK: 33 U/L (ref 7–177)
Total CK: 37 U/L (ref 7–177)
Troponin I: 0.01 ng/mL (ref 0.00–0.06)

## 2010-04-07 LAB — URINALYSIS, ROUTINE W REFLEX MICROSCOPIC
Bilirubin Urine: NEGATIVE
Glucose, UA: NEGATIVE mg/dL
Glucose, UA: NEGATIVE mg/dL
Hgb urine dipstick: NEGATIVE
Ketones, ur: NEGATIVE mg/dL
Ketones, ur: NEGATIVE mg/dL
Protein, ur: NEGATIVE mg/dL
pH: 5.5 (ref 5.0–8.0)
pH: 8.5 — ABNORMAL HIGH (ref 5.0–8.0)

## 2010-04-07 LAB — COMPREHENSIVE METABOLIC PANEL WITH GFR
ALT: 10 U/L (ref 0–35)
ALT: 10 U/L (ref 0–35)
AST: 14 U/L (ref 0–37)
AST: 17 U/L (ref 0–37)
Albumin: 3.1 g/dL — ABNORMAL LOW (ref 3.5–5.2)
Albumin: 3.2 g/dL — ABNORMAL LOW (ref 3.5–5.2)
Alkaline Phosphatase: 112 U/L (ref 39–117)
Alkaline Phosphatase: 113 U/L (ref 39–117)
BUN: 10 mg/dL (ref 6–23)
BUN: 9 mg/dL (ref 6–23)
CO2: 31 meq/L (ref 19–32)
CO2: 31 meq/L (ref 19–32)
Calcium: 8.3 mg/dL — ABNORMAL LOW (ref 8.4–10.5)
Calcium: 8.4 mg/dL (ref 8.4–10.5)
Chloride: 104 meq/L (ref 96–112)
Chloride: 108 meq/L (ref 96–112)
Creatinine, Ser: 0.72 mg/dL (ref 0.4–1.2)
Creatinine, Ser: 0.78 mg/dL (ref 0.4–1.2)
GFR calc non Af Amer: >60 mL/min
GFR calc non Af Amer: >60 mL/min
Glucose, Bld: 100 mg/dL — ABNORMAL HIGH (ref 70–99)
Glucose, Bld: 113 mg/dL — ABNORMAL HIGH (ref 70–99)
Potassium: 3.8 meq/L (ref 3.5–5.1)
Potassium: 4.1 meq/L (ref 3.5–5.1)
Sodium: 141 meq/L (ref 135–145)
Sodium: 142 meq/L (ref 135–145)
Total Bilirubin: 0.3 mg/dL (ref 0.3–1.2)
Total Bilirubin: <0.1 mg/dL — ABNORMAL LOW (ref 0.3–1.2)
Total Protein: 5.6 g/dL — ABNORMAL LOW (ref 6.0–8.3)
Total Protein: 5.7 g/dL — ABNORMAL LOW (ref 6.0–8.3)

## 2010-04-07 LAB — CBC
HCT: 34.1 % — ABNORMAL LOW (ref 36.0–46.0)
HCT: 34.5 % — ABNORMAL LOW (ref 36.0–46.0)
HCT: 35.4 % — ABNORMAL LOW (ref 36.0–46.0)
Hemoglobin: 11.3 g/dL — ABNORMAL LOW (ref 12.0–15.0)
Hemoglobin: 11.4 g/dL — ABNORMAL LOW (ref 12.0–15.0)
Hemoglobin: 11.5 g/dL — ABNORMAL LOW (ref 12.0–15.0)
MCH: 27.2 pg (ref 26.0–34.0)
MCH: 27.8 pg (ref 26.0–34.0)
MCHC: 32.5 g/dL (ref 30.0–36.0)
MCHC: 32.7 g/dL (ref 30.0–36.0)
MCHC: 33.3 g/dL (ref 30.0–36.0)
MCV: 83.6 fL (ref 78.0–100.0)
MCV: 84.2 fL (ref 78.0–100.0)
Platelets: 280 K/uL (ref 150–400)
RBC: 4.08 MIL/uL (ref 3.87–5.11)
RBC: 4.15 MIL/uL (ref 3.87–5.11)
RDW: 17.4 % — ABNORMAL HIGH (ref 11.5–15.5)
WBC: 7.7 K/uL (ref 4.0–10.5)

## 2010-04-07 LAB — DIFFERENTIAL
Basophils Absolute: 0 10*3/uL (ref 0.0–0.1)
Eosinophils Absolute: 0.1 10*3/uL (ref 0.0–0.7)
Eosinophils Relative: 2 % (ref 0–5)
Lymphocytes Relative: 23 % (ref 12–46)
Lymphs Abs: 1.6 10*3/uL (ref 0.7–4.0)
Neutrophils Relative %: 68 % (ref 43–77)

## 2010-04-07 LAB — CULTURE, BLOOD (ROUTINE X 2): Culture: NO GROWTH

## 2010-04-07 LAB — URINE MICROSCOPIC-ADD ON

## 2010-04-07 LAB — POCT CARDIAC MARKERS
CKMB, poc: <1 ng/mL — ABNORMAL LOW (ref 1.0–8.0)
Myoglobin, poc: 40.7 ng/mL (ref 12–200)
Troponin i, poc: <0.05 ng/mL (ref 0.00–0.09)

## 2010-04-07 LAB — TSH: TSH: 1.658 u[IU]/mL (ref 0.350–4.500)

## 2010-04-07 LAB — BRAIN NATRIURETIC PEPTIDE: Pro B Natriuretic peptide (BNP): 51 pg/mL (ref 0.0–100.0)

## 2010-04-24 LAB — HEMOGLOBIN A1C
Hgb A1c MFr Bld: 6.2 % — ABNORMAL HIGH (ref 4.6–6.1)
Mean Plasma Glucose: 131 mg/dL

## 2010-04-24 LAB — CBC
HCT: 34.5 % — ABNORMAL LOW (ref 36.0–46.0)
HCT: 37.7 % (ref 36.0–46.0)
Hemoglobin: 12.6 g/dL (ref 12.0–15.0)
MCHC: 33 g/dL (ref 30.0–36.0)
MCHC: 33.7 g/dL (ref 30.0–36.0)
MCV: 87.5 fL (ref 78.0–100.0)
MCV: 87.5 fL (ref 78.0–100.0)
MCV: 88 fL (ref 78.0–100.0)
Platelets: 231 10*3/uL (ref 150–400)
Platelets: 265 10*3/uL (ref 150–400)
RBC: 4.01 MIL/uL (ref 3.87–5.11)
RBC: 4.3 MIL/uL (ref 3.87–5.11)
RDW: 16.6 % — ABNORMAL HIGH (ref 11.5–15.5)
WBC: 6.7 10*3/uL (ref 4.0–10.5)

## 2010-04-24 LAB — COMPREHENSIVE METABOLIC PANEL
AST: 14 U/L (ref 0–37)
Albumin: 2.9 g/dL — ABNORMAL LOW (ref 3.5–5.2)
BUN: 5 mg/dL — ABNORMAL LOW (ref 6–23)
BUN: 6 mg/dL (ref 6–23)
CO2: 31 mEq/L (ref 19–32)
Chloride: 104 mEq/L (ref 96–112)
Creatinine, Ser: 0.65 mg/dL (ref 0.4–1.2)
Creatinine, Ser: 0.72 mg/dL (ref 0.4–1.2)
GFR calc Af Amer: >60 mL/min (ref >60)
GFR calc non Af Amer: >60 mL/min (ref >60)
Total Bilirubin: 0.4 mg/dL (ref 0.3–1.2)
Total Protein: 5.3 g/dL — ABNORMAL LOW (ref 6.0–8.3)

## 2010-04-24 LAB — DIFFERENTIAL
Basophils Absolute: 0 10*3/uL (ref 0.0–0.1)
Basophils Absolute: 0 10*3/uL (ref 0.0–0.1)
Eosinophils Absolute: 0.1 10*3/uL (ref 0.0–0.7)
Eosinophils Relative: 2 % (ref 0–5)
Lymphocytes Relative: 21 % (ref 12–46)
Lymphocytes Relative: 26 % (ref 12–46)
Monocytes Absolute: 0.5 10*3/uL (ref 0.1–1.0)
Monocytes Relative: 8 % (ref 3–12)
Neutro Abs: 4.3 10*3/uL (ref 1.7–7.7)
Neutrophils Relative %: 64 % (ref 43–77)

## 2010-04-24 LAB — POCT CARDIAC MARKERS: Troponin i, poc: <0.05 ng/mL (ref 0.00–0.09)

## 2010-04-24 LAB — PHOSPHORUS: Phosphorus: 3.5 mg/dL (ref 2.3–4.6)

## 2010-04-24 LAB — TROPONIN I: Troponin I: 0.01 ng/mL (ref 0.00–0.06)

## 2010-04-24 LAB — CARDIAC PANEL(CRET KIN+CKTOT+MB+TROPI)
CK, MB: 0.5 ng/mL (ref 0.3–4.0)
Relative Index: INVALID (ref 0.0–2.5)
Troponin I: 0.01 ng/mL (ref 0.00–0.06)
Troponin I: 0.01 ng/mL (ref 0.00–0.06)

## 2010-04-24 LAB — URINE CULTURE

## 2010-04-24 LAB — URINE MICROSCOPIC-ADD ON

## 2010-04-24 LAB — URINALYSIS, ROUTINE W REFLEX MICROSCOPIC
Bilirubin Urine: NEGATIVE
Hgb urine dipstick: NEGATIVE
Specific Gravity, Urine: 1.018 (ref 1.005–1.030)
pH: 7.5 (ref 5.0–8.0)

## 2010-04-24 LAB — BASIC METABOLIC PANEL
BUN: 6 mg/dL (ref 6–23)
CO2: 30 mEq/L (ref 19–32)
Calcium: 8.3 mg/dL — ABNORMAL LOW (ref 8.4–10.5)
Creatinine, Ser: 0.64 mg/dL (ref 0.4–1.2)
GFR calc Af Amer: >60 mL/min (ref >60)

## 2010-04-24 LAB — TSH: TSH: 1.39 u[IU]/mL (ref 0.350–4.500)

## 2010-04-27 LAB — CBC
MCV: 85 fL (ref 78.0–100.0)
Platelets: 297 10*3/uL (ref 150–400)
RBC: 4.55 MIL/uL (ref 3.87–5.11)
WBC: 7.1 10*3/uL (ref 4.0–10.5)

## 2010-04-29 LAB — CBC
HCT: 21.5 % — ABNORMAL LOW (ref 36.0–46.0)
HCT: 24 % — ABNORMAL LOW (ref 36.0–46.0)
HCT: 29.7 % — ABNORMAL LOW (ref 36.0–46.0)
HCT: 30.5 % — ABNORMAL LOW (ref 36.0–46.0)
HCT: 31.3 % — ABNORMAL LOW (ref 36.0–46.0)
Hemoglobin: 6.3 g/dL — CL (ref 12.0–15.0)
Hemoglobin: 7.5 g/dL — CL (ref 12.0–15.0)
Hemoglobin: 8.8 g/dL — ABNORMAL LOW (ref 12.0–15.0)
Hemoglobin: 9.5 g/dL — ABNORMAL LOW (ref 12.0–15.0)
Hemoglobin: 9.6 g/dL — ABNORMAL LOW (ref 12.0–15.0)
Hemoglobin: 9.7 g/dL — ABNORMAL LOW (ref 12.0–15.0)
MCHC: 31.2 g/dL (ref 30.0–36.0)
MCHC: 31.3 g/dL (ref 30.0–36.0)
MCHC: 31.3 g/dL (ref 30.0–36.0)
MCHC: 31.6 g/dL (ref 30.0–36.0)
MCHC: 31.7 g/dL (ref 30.0–36.0)
MCV: 73.7 fL — ABNORMAL LOW (ref 78.0–100.0)
MCV: 77.1 fL — ABNORMAL LOW (ref 78.0–100.0)
MCV: 77.1 fL — ABNORMAL LOW (ref 78.0–100.0)
Platelets: 265 10*3/uL (ref 150–400)
Platelets: 275 10*3/uL (ref 150–400)
Platelets: 291 10*3/uL (ref 150–400)
Platelets: 301 10*3/uL (ref 150–400)
Platelets: 320 10*3/uL (ref 150–400)
RBC: 3.09 MIL/uL — ABNORMAL LOW (ref 3.87–5.11)
RBC: 3.25 MIL/uL — ABNORMAL LOW (ref 3.87–5.11)
RBC: 3.78 MIL/uL — ABNORMAL LOW (ref 3.87–5.11)
RBC: 3.89 MIL/uL (ref 3.87–5.11)
RBC: 3.96 MIL/uL (ref 3.87–5.11)
RBC: 3.98 MIL/uL (ref 3.87–5.11)
RDW: 22.1 % — ABNORMAL HIGH (ref 11.5–15.5)
RDW: 23.4 % — ABNORMAL HIGH (ref 11.5–15.5)
RDW: 23.4 % — ABNORMAL HIGH (ref 11.5–15.5)
RDW: 24.7 % — ABNORMAL HIGH (ref 11.5–15.5)
RDW: 24.7 % — ABNORMAL HIGH (ref 11.5–15.5)
WBC: 6.1 10*3/uL (ref 4.0–10.5)
WBC: 7 10*3/uL (ref 4.0–10.5)
WBC: 7.4 10*3/uL (ref 4.0–10.5)
WBC: 7.6 10*3/uL (ref 4.0–10.5)
WBC: 8.3 10*3/uL (ref 4.0–10.5)

## 2010-04-29 LAB — URINALYSIS, ROUTINE W REFLEX MICROSCOPIC
Bilirubin Urine: NEGATIVE
Glucose, UA: NEGATIVE mg/dL
Hgb urine dipstick: NEGATIVE
Ketones, ur: NEGATIVE mg/dL
Nitrite: POSITIVE — AB
Protein, ur: NEGATIVE mg/dL
Specific Gravity, Urine: 1.023 (ref 1.005–1.030)
Urobilinogen, UA: 0.2 mg/dL (ref 0.0–1.0)
pH: 7 (ref 5.0–8.0)

## 2010-04-29 LAB — URINE CULTURE: Colony Count: >=100000

## 2010-04-29 LAB — COMPREHENSIVE METABOLIC PANEL
ALT: 12 U/L (ref 0–35)
ALT: 14 U/L (ref 0–35)
AST: 17 U/L (ref 0–37)
AST: 22 U/L (ref 0–37)
Albumin: 3 g/dL — ABNORMAL LOW (ref 3.5–5.2)
Albumin: 3.4 g/dL — ABNORMAL LOW (ref 3.5–5.2)
Alkaline Phosphatase: 126 U/L — ABNORMAL HIGH (ref 39–117)
Alkaline Phosphatase: 136 U/L — ABNORMAL HIGH (ref 39–117)
BUN: 13 mg/dL (ref 6–23)
BUN: 6 mg/dL (ref 6–23)
CO2: 25 mEq/L (ref 19–32)
CO2: 32 mEq/L (ref 19–32)
Calcium: 8.4 mg/dL (ref 8.4–10.5)
Calcium: 8.4 mg/dL (ref 8.4–10.5)
Chloride: 107 mEq/L (ref 96–112)
Chloride: 107 mEq/L (ref 96–112)
Creatinine, Ser: 0.68 mg/dL (ref 0.4–1.2)
Creatinine, Ser: 0.72 mg/dL (ref 0.4–1.2)
GFR calc Af Amer: >60 mL/min (ref >60)
GFR calc Af Amer: >60 mL/min (ref >60)
GFR calc non Af Amer: >60 mL/min (ref >60)
GFR calc non Af Amer: >60 mL/min (ref >60)
Glucose, Bld: 113 mg/dL — ABNORMAL HIGH (ref 70–99)
Glucose, Bld: 65 mg/dL — ABNORMAL LOW (ref 70–99)
Potassium: 3.8 mEq/L (ref 3.5–5.1)
Potassium: 4.4 mEq/L (ref 3.5–5.1)
Sodium: 142 mEq/L (ref 135–145)
Sodium: 145 mEq/L (ref 135–145)
Total Bilirubin: 0.3 mg/dL (ref 0.3–1.2)
Total Bilirubin: 0.3 mg/dL (ref 0.3–1.2)
Total Protein: 5.7 g/dL — ABNORMAL LOW (ref 6.0–8.3)
Total Protein: 6.2 g/dL (ref 6.0–8.3)

## 2010-04-29 LAB — GLUCOSE, CAPILLARY: Glucose-Capillary: 109 mg/dL — ABNORMAL HIGH (ref 70–99)

## 2010-04-29 LAB — PROTIME-INR
INR: 1.2 (ref 0.00–1.49)
INR: 1.3 (ref 0.00–1.49)
INR: 4.4 — ABNORMAL HIGH (ref 0.00–1.49)
Prothrombin Time: 15.9 seconds — ABNORMAL HIGH (ref 11.6–15.2)
Prothrombin Time: 17.1 seconds — ABNORMAL HIGH (ref 11.6–15.2)

## 2010-04-29 LAB — TSH: TSH: 1.763 u[IU]/mL (ref 0.350–4.500)

## 2010-04-29 LAB — CROSSMATCH
ABO/RH(D): A POS
Antibody Screen: NEGATIVE

## 2010-04-29 LAB — POCT I-STAT, CHEM 8
BUN: 15 mg/dL (ref 6–23)
Calcium, Ion: 1.05 mmol/L — ABNORMAL LOW (ref 1.12–1.32)
Chloride: 106 mEq/L (ref 96–112)
Creatinine, Ser: 0.7 mg/dL (ref 0.4–1.2)
Glucose, Bld: 62 mg/dL — ABNORMAL LOW (ref 70–99)
HCT: 22 % — ABNORMAL LOW (ref 36.0–46.0)
Hemoglobin: 7.5 g/dL — CL (ref 12.0–15.0)
Potassium: 3.8 mEq/L (ref 3.5–5.1)
Sodium: 139 mEq/L (ref 135–145)
TCO2: 26 mmol/L (ref 0–100)

## 2010-04-29 LAB — PREPARE RBC (CROSSMATCH)

## 2010-04-29 LAB — BASIC METABOLIC PANEL
BUN: 7 mg/dL (ref 6–23)
Calcium: 7.9 mg/dL — ABNORMAL LOW (ref 8.4–10.5)
Calcium: 8.4 mg/dL (ref 8.4–10.5)
Calcium: 8.4 mg/dL (ref 8.4–10.5)
Creatinine, Ser: 0.7 mg/dL (ref 0.4–1.2)
GFR calc Af Amer: >60 mL/min (ref >60)
GFR calc Af Amer: >60 mL/min (ref >60)
GFR calc Af Amer: >60 mL/min (ref >60)
GFR calc non Af Amer: >60 mL/min (ref >60)
GFR calc non Af Amer: >60 mL/min (ref >60)
Glucose, Bld: 110 mg/dL — ABNORMAL HIGH (ref 70–99)
Glucose, Bld: 126 mg/dL — ABNORMAL HIGH (ref 70–99)
Potassium: 3.8 mEq/L (ref 3.5–5.1)
Sodium: 139 mEq/L (ref 135–145)
Sodium: 142 mEq/L (ref 135–145)

## 2010-04-29 LAB — POCT CARDIAC MARKERS
CKMB, poc: <1 ng/mL — ABNORMAL LOW (ref 1.0–8.0)
Myoglobin, poc: 11.5 ng/mL — ABNORMAL LOW (ref 12–200)
Myoglobin, poc: 49 ng/mL (ref 12–200)
Troponin i, poc: <0.05 ng/mL (ref 0.00–0.09)
Troponin i, poc: <0.05 ng/mL (ref 0.00–0.09)

## 2010-04-29 LAB — DIFFERENTIAL
Basophils Absolute: 0.1 10*3/uL (ref 0.0–0.1)
Eosinophils Relative: 2 % (ref 0–5)
Lymphocytes Relative: 19 % (ref 12–46)
Lymphs Abs: 1.5 10*3/uL (ref 0.7–4.0)
Monocytes Absolute: 0.5 10*3/uL (ref 0.1–1.0)
Monocytes Relative: 6 % (ref 3–12)
Neutro Abs: 6 10*3/uL (ref 1.7–7.7)

## 2010-04-29 LAB — IRON AND TIBC
Saturation Ratios: 3 % — ABNORMAL LOW (ref 20–55)
TIBC: 391 ug/dL (ref 250–470)

## 2010-04-29 LAB — VITAMIN B12: Vitamin B-12: 376 pg/mL (ref 211–911)

## 2010-04-29 LAB — RETICULOCYTES
RBC.: 3.04 MIL/uL — ABNORMAL LOW (ref 3.87–5.11)
Retic Count, Absolute: 48.6 10*3/uL (ref 19.0–186.0)

## 2010-04-29 LAB — ABO/RH: ABO/RH(D): A POS

## 2010-04-29 LAB — URINE MICROSCOPIC-ADD ON

## 2010-04-29 LAB — HAPTOGLOBIN: Haptoglobin: 229 mg/dL — ABNORMAL HIGH (ref 16–200)

## 2010-04-29 LAB — BRAIN NATRIURETIC PEPTIDE: Pro B Natriuretic peptide (BNP): 73 pg/mL (ref 0.0–100.0)

## 2010-04-29 LAB — FOLATE: Folate: 14.8 ng/mL

## 2010-04-29 LAB — HEMOCCULT GUIAC POC 1CARD (OFFICE): Fecal Occult Bld: POSITIVE

## 2010-04-29 LAB — FERRITIN: Ferritin: <1 ng/mL — ABNORMAL LOW (ref 10–291)

## 2010-05-02 ENCOUNTER — Telehealth: Payer: Self-pay | Admitting: *Deleted

## 2010-05-02 LAB — POCT I-STAT, CHEM 8
BUN: 9 mg/dL (ref 6–23)
Creatinine, Ser: 0.6 mg/dL (ref 0.4–1.2)
Potassium: 3.9 mEq/L (ref 3.5–5.1)
Sodium: 140 mEq/L (ref 135–145)
TCO2: 27 mmol/L (ref 0–100)

## 2010-05-02 LAB — BASIC METABOLIC PANEL
CO2: 28 mEq/L (ref 19–32)
Calcium: 8.7 mg/dL (ref 8.4–10.5)
Creatinine, Ser: 0.63 mg/dL (ref 0.4–1.2)
GFR calc Af Amer: >60 mL/min (ref >60)
GFR calc non Af Amer: >60 mL/min (ref >60)

## 2010-05-02 LAB — URINALYSIS, ROUTINE W REFLEX MICROSCOPIC
Glucose, UA: NEGATIVE mg/dL
Hgb urine dipstick: NEGATIVE
Ketones, ur: NEGATIVE mg/dL
Leukocytes, UA: NEGATIVE
Protein, ur: NEGATIVE mg/dL
Urobilinogen, UA: 0.2 mg/dL (ref 0.0–1.0)

## 2010-05-02 LAB — COMPREHENSIVE METABOLIC PANEL
Alkaline Phosphatase: 172 U/L — ABNORMAL HIGH (ref 39–117)
BUN: 9 mg/dL (ref 6–23)
Chloride: 106 mEq/L (ref 96–112)
Creatinine, Ser: 0.68 mg/dL (ref 0.4–1.2)
GFR calc non Af Amer: >60 mL/min (ref >60)
Glucose, Bld: 195 mg/dL — ABNORMAL HIGH (ref 70–99)
Potassium: 3.3 mEq/L — ABNORMAL LOW (ref 3.5–5.1)
Total Bilirubin: 0.3 mg/dL (ref 0.3–1.2)

## 2010-05-02 LAB — CBC
HCT: 32.3 % — ABNORMAL LOW (ref 36.0–46.0)
Hemoglobin: 11.3 g/dL — ABNORMAL LOW (ref 12.0–15.0)
Hemoglobin: 11.8 g/dL — ABNORMAL LOW (ref 12.0–15.0)
MCHC: 32.8 g/dL (ref 30.0–36.0)
MCHC: 32.9 g/dL (ref 30.0–36.0)
MCV: 84.5 fL (ref 78.0–100.0)
Platelets: 335 10*3/uL (ref 150–400)
RBC: 4.25 MIL/uL (ref 3.87–5.11)
RDW: 18.7 % — ABNORMAL HIGH (ref 11.5–15.5)
RDW: 18.9 % — ABNORMAL HIGH (ref 11.5–15.5)
WBC: 16.8 10*3/uL — ABNORMAL HIGH (ref 4.0–10.5)
WBC: 8.6 10*3/uL (ref 4.0–10.5)

## 2010-05-02 LAB — POCT CARDIAC MARKERS
CKMB, poc: <1 ng/mL — ABNORMAL LOW (ref 1.0–8.0)
Myoglobin, poc: 41 ng/mL (ref 12–200)

## 2010-05-02 LAB — DIFFERENTIAL
Basophils Relative: 0 % (ref 0–1)
Lymphocytes Relative: 25 % (ref 12–46)
Lymphs Abs: 2.1 10*3/uL (ref 0.7–4.0)
Monocytes Absolute: 0.7 10*3/uL (ref 0.1–1.0)
Monocytes Relative: 8 % (ref 3–12)
Neutro Abs: 5.4 10*3/uL (ref 1.7–7.7)
Neutrophils Relative %: 63 % (ref 43–77)

## 2010-05-02 LAB — LIPID PANEL
HDL: 45 mg/dL (ref >39)
Total CHOL/HDL Ratio: 3.2 RATIO
VLDL: 12 mg/dL (ref 0–40)

## 2010-05-02 LAB — PROTIME-INR
INR: 1.3 (ref 0.00–1.49)
INR: 1.4 (ref 0.00–1.49)
Prothrombin Time: 17.9 seconds — ABNORMAL HIGH (ref 11.6–15.2)
Prothrombin Time: 18.4 seconds — ABNORMAL HIGH (ref 11.6–15.2)

## 2010-05-02 LAB — CARDIAC PANEL(CRET KIN+CKTOT+MB+TROPI)
CK, MB: 1.1 ng/mL (ref 0.3–4.0)
Relative Index: INVALID (ref 0.0–2.5)
Troponin I: 0.02 ng/mL (ref 0.00–0.06)
Troponin I: <0.01 ng/mL (ref 0.00–0.06)

## 2010-05-02 LAB — RAPID URINE DRUG SCREEN, HOSP PERFORMED
Amphetamines: NOT DETECTED
Barbiturates: NOT DETECTED
Benzodiazepines: POSITIVE — AB
Opiates: POSITIVE — AB
Tetrahydrocannabinol: NOT DETECTED

## 2010-05-02 LAB — TROPONIN I: Troponin I: <0.01 ng/mL (ref 0.00–0.06)

## 2010-05-02 LAB — BRAIN NATRIURETIC PEPTIDE: Pro B Natriuretic peptide (BNP): 44 pg/mL (ref 0.0–100.0)

## 2010-05-02 NOTE — Telephone Encounter (Signed)
Pt called and states she is having total left knee replacement, (pt states she is high risk and may not survive ) Surgery is in 6 weeks. Her surgeon, Dr Charlann Boxer, wants pt seen twice before then.  She has been trying to get an appointment but is placed on  Waiting list. Pt is tearful and concerned. Pt # [phone]  She would love a call.

## 2010-05-19 ENCOUNTER — Telehealth: Payer: Self-pay | Admitting: Internal Medicine

## 2010-05-19 NOTE — Telephone Encounter (Signed)
Patient called for increased pan and swelling in her right knee joint which is about to undergo replacement in coming weeks. Patient was told to elevate her legs above the heart level and call up clinic to be seen by a doctor for pain control until she has her surgery. Patient was also advised to take extra pill of roxicodone for pain control until she is seen in the clinic.

## 2010-05-20 ENCOUNTER — Encounter: Payer: Self-pay | Admitting: Internal Medicine

## 2010-05-20 ENCOUNTER — Inpatient Hospital Stay (HOSPITAL_COMMUNITY)
Admission: EM | Admit: 2010-05-20 | Discharge: 2010-05-24 | DRG: 300 | Disposition: A | Discharge status: Skilled Nursing Facility | Payer: Medicare Other | Attending: Internal Medicine | Admitting: Internal Medicine

## 2010-05-20 ENCOUNTER — Emergency Department (HOSPITAL_COMMUNITY)
Admission: EM | Admit: 2010-05-20 | Discharge: 2010-05-20 | Disposition: A | Discharge status: Other Acute Inpatient Hospital | Payer: Medicare Other | Source: Home / Self Care | Attending: Emergency Medicine | Admitting: Emergency Medicine

## 2010-05-20 DIAGNOSIS — I509 Heart failure, unspecified: Secondary | ICD-10-CM | POA: Insufficient documentation

## 2010-05-20 DIAGNOSIS — J449 Chronic obstructive pulmonary disease, unspecified: Secondary | ICD-10-CM | POA: Diagnosis present

## 2010-05-20 DIAGNOSIS — I10 Essential (primary) hypertension: Secondary | ICD-10-CM | POA: Diagnosis present

## 2010-05-20 DIAGNOSIS — E662 Morbid (severe) obesity with alveolar hypoventilation: Secondary | ICD-10-CM | POA: Diagnosis present

## 2010-05-20 DIAGNOSIS — N390 Urinary tract infection, site not specified: Secondary | ICD-10-CM | POA: Diagnosis present

## 2010-05-20 DIAGNOSIS — E876 Hypokalemia: Secondary | ICD-10-CM | POA: Diagnosis present

## 2010-05-20 DIAGNOSIS — D509 Iron deficiency anemia, unspecified: Secondary | ICD-10-CM | POA: Diagnosis present

## 2010-05-20 DIAGNOSIS — K589 Irritable bowel syndrome without diarrhea: Secondary | ICD-10-CM | POA: Diagnosis present

## 2010-05-20 DIAGNOSIS — I824Z9 Acute embolism and thrombosis of unspecified deep veins of unspecified distal lower extremity: Principal | ICD-10-CM | POA: Diagnosis present

## 2010-05-20 DIAGNOSIS — F341 Dysthymic disorder: Secondary | ICD-10-CM | POA: Diagnosis present

## 2010-05-20 DIAGNOSIS — J4489 Other specified chronic obstructive pulmonary disease: Secondary | ICD-10-CM | POA: Diagnosis present

## 2010-05-20 DIAGNOSIS — I82409 Acute embolism and thrombosis of unspecified deep veins of unspecified lower extremity: Secondary | ICD-10-CM | POA: Insufficient documentation

## 2010-05-20 DIAGNOSIS — M7989 Other specified soft tissue disorders: Secondary | ICD-10-CM | POA: Insufficient documentation

## 2010-05-20 DIAGNOSIS — G4733 Obstructive sleep apnea (adult) (pediatric): Secondary | ICD-10-CM | POA: Diagnosis present

## 2010-05-20 DIAGNOSIS — K219 Gastro-esophageal reflux disease without esophagitis: Secondary | ICD-10-CM | POA: Diagnosis present

## 2010-05-20 LAB — URINE MICROSCOPIC-ADD ON

## 2010-05-20 LAB — BASIC METABOLIC PANEL
BUN: 11 mg/dL (ref 6–23)
Calcium: 8.6 mg/dL (ref 8.4–10.5)
GFR calc non Af Amer: >60 mL/min (ref >60)
Glucose, Bld: 126 mg/dL — ABNORMAL HIGH (ref 70–99)
Potassium: 3.7 mEq/L (ref 3.5–5.1)
Sodium: 137 mEq/L (ref 135–145)

## 2010-05-20 LAB — APTT: aPTT: 33 seconds (ref 24–37)

## 2010-05-20 LAB — URINALYSIS, ROUTINE W REFLEX MICROSCOPIC
Glucose, UA: NEGATIVE mg/dL
Protein, ur: 30 mg/dL — AB
Urobilinogen, UA: 1 mg/dL (ref 0.0–1.0)

## 2010-05-20 LAB — PROTIME-INR: Prothrombin Time: 15.6 seconds — ABNORMAL HIGH (ref 11.6–15.2)

## 2010-05-20 NOTE — H&P (Signed)
Hospital Admission Note Date: 05/20/2010  Patient name: [name] Medical record number: [id number] Date of birth: Aug 15, 1945 Age: 65 y.o. Gender: female PCP: Mliss Sax, MD  Medical Service: Internal Medicine  Attending physician:  Dr. Julaine Fusi  Pager: Resident [phone]): Dr. Baltazar Apo   Pager: [phone] Resident (R1): Dr. Dorthula Rue   Pager: [phone]  Chief Complaint:left leg swelling History of Present Illness: 65 yo woman with PMH of CHF, HTN, chronic back pain s/p multiple surgeries, OA, hx pf PE, depression, COPD on home oxygen and nebulizer as needed presents to [facility] ED for pain and swelling in her left leg in the past 4 days.  Patient normally has swelling in her legs; however, she reports that the pain and swelling in her left leg is worsened with movement.  She denies any increase in SOB, chest pain, fever, cough, or urinary symptoms.    Current Outpatient Prescriptions  Medication Sig Dispense Refill  . albuterol (PROVENTIL HFA) 108 (90 BASE) MCG/ACT inhaler Inhale 2 puffs into the lungs every 4 (four) hours as needed.        . Ferrous Sulfate (IRON) 325 (65 FE) MG TABS Take 1 tablet by mouth 3 (three) times daily.        . furosemide (LASIX) 40 MG tablet Take 40 mg by mouth daily.        Marland Kitchen gabapentin (NEURONTIN) 100 MG capsule Take 100 mg by mouth 3 (three) times daily.        Marland Kitchen ipratropium (ATROVENT) 0.06 % nasal spray 1 spray by Nasal route 3 (three) times daily.        Marland Kitchen lamoTRIgine (LAMICTAL) 100 MG tablet Take 100 mg by mouth daily.        Marland Kitchen LORazepam (ATIVAN) 1 MG tablet Take one tablet in morning and two tablets by mouth at bedtime       . omeprazole (PRILOSEC) 20 MG capsule Take 20 mg by mouth daily.        Marland Kitchen oxyCODONE-acetaminophen (PERCOCET) 10-325 MG per tablet Take 1 tablet by mouth every 6 (six) hours as needed.        . pravastatin (PRAVACHOL) 40 MG tablet Take 40 mg by mouth at bedtime.        . promethazine (PHENERGAN) 25 MG tablet Take 25 mg by mouth every 12  (twelve) hours as needed.        Marland Kitchen QUEtiapine (SEROQUEL) 200 MG tablet Take 400 mg by mouth at bedtime.        . sertraline (ZOLOFT) 100 MG tablet Take 200 mg by mouth daily.          Allergies: Chlorpromazine hcl; Codeine; Morphine; and Sulfonamide derivatives  Past Medical History  Diagnosis Date  . COPD (chronic obstructive pulmonary disease)   . Depression     followed by Dr. Gwyndolyn Kaufman, [facility]  . GERD (gastroesophageal reflux disease)   . Hypertension   . Hyperlipidemia   . Basal cell carcinoma 09/2001    followed by Dr. Karlyn Agee  . Interstitial cystitis     followed by Dr. Isabel Caprice  . Irritable bowel syndrome     colonoscopy 12/2003 and EGD 1997 WNL, followed by Dr. Marina Goodell  . Spinal stenosis     L3-L4, L4-L5 per MRI 12/2004, status post lumbar decompression 02/2007 by Dr. Gerlene Fee  . Pulmonary embolism 10/2007    on coumadin and followed by Dr. Alexandria Lodge  . OSA on CPAP     noncompliance  . S/P mastectomy 1993  bilateral. status post reconstruction in 1994 by Dr. Benna Dunks  . S/P rhinoplasty 1993  . History of back surgery 02/2007    Past Surgical History  Procedure Date  . Cervical discectomy 11/1995 and 05/2002    by Dr. Gerlene Fee  . Abdominal hysterectomy 1976  . Bladder suspension 1993  . Orif ankle fracture 1993  . Lumbar laminectomy/decompression microdiscectomy 02/2007    by Dr. Gerlene Fee    Family History  Problem Relation Age of Onset  . Heart disease Mother   . Cancer Brother     History   Social History  . Marital Status: Divorced    Spouse Name: N/A    Number of Children: N/A  . Years of Education: N/A   Occupational History  . Not on file.   Social History Main Topics  . Smoking status: Current Everyday Smoker -- 1.0 packs/day  . Smokeless tobacco: Not on file  . Alcohol Use: No  . Drug Use: No  . Sexually Active:    Other Topics Concern  . Not on file   Social History Narrative  . No narrative on file    Review of Systems: As per  HPI  Physical Exam: T 98.5    BP 115/73   P 83  RR20  O2 sat 98% 2L [place]  GEN: tearful, NAD, speaking in complete sentences HEENT: PERRLA, EOMI, pink and moist pharynx Heart:RRR, no m/g/r Lungs: CTAB, no wheezes, no crackles Abd: obese, soft, ND, NT, normoactive BS Ext: left LE +2-3 nonpitting edema with erythema, tenderness, warm to touch. There is RLE edema but << than left.    +1 DP pulse on R, could not palpate on left 2/2 to swelling GU: erythematous patches on inguinal folds and inner thighs, no drainage or tenderness Neuro: nonfocal, Alert and oriented x3, CN II-XII grossly intact Psych: tearful and anxious; otherwise, appropriate judgement/insight  Lab results:  Sodium (NA)                              137               135-145          mEq/L  Potassium (K)                            3.7               3.5-5.1          mEq/L  Chloride                                 94         l      96-112           mEq/L  CO2                                      35         h      19-32            mEq/L  Glucose  126        h      70-99            mg/dL  BUN                                      11                6-23             mg/dL  Creatinine                               0.83              0.4-1.2          mg/dL  GFR, Est Non African American            >60               >60              mL/min  GFR, Est African American                >60               >60              mL/min    Oversized comment, see footnote  1  Calcium                                  8.6               8.4-10.5         Mg/dL   Protime ( Prothrombin Time)              15.6       h      11.6-15.2        seconds  INR                                      1.22              0.00-1.49  PTT(a-Partial Thromboplastn Time)        33                24-37            Seconds   Color, Urine                             AMBER      a      YELLOW    BIOCHEMICALS MAY BE AFFECTED BY COLOR  Appearance                                CLOUDY     a      CLEAR  Specific Gravity                         1.035      h      1.005-1.030  pH  6.0               5.0-8.0  Urine Glucose                            NEGATIVE          NEG              mg/dL  Bilirubin                                SMALL      a      NEG  Ketones                                  TRACE      a      NEG              mg/dL  Blood                                    MODERATE   a      NEG  Protein                                  30         a      NEG              mg/dL  Urobilinogen                             1.0               0.0-1.0          mg/dL  Nitrite                                  POSITIVE   a      NEG  Leukocytes                               SMALL      a      NEG  Microscopy: Squamous Epithelial / LPF                FEW        a      RARE  Casts / HPF                              SEE NOTE.  a      NEG    HYALINE CASTS  WBC / HPF                                7-10              <3               WBC/hpf  RBC / HPF  3-6               <3               RBC/hpf  Bacteria / HPF                           MANY       a      RARE  Urine-Other                              SEE NOTE.    MUCOUS PRESENT  Imaging results:   Other results:  Assessment & Plan by Problem:

## 2010-05-20 NOTE — Telephone Encounter (Signed)
Talked to [place] about scheduling pt an appt. Per Dr Eben Burow.

## 2010-05-21 DIAGNOSIS — M79609 Pain in unspecified limb: Secondary | ICD-10-CM

## 2010-05-21 DIAGNOSIS — M7989 Other specified soft tissue disorders: Secondary | ICD-10-CM

## 2010-05-21 LAB — DIFFERENTIAL
Basophils Absolute: 0 K/uL (ref 0.0–0.1)
Basophils Absolute: 0 10*3/uL (ref 0.0–0.1)
Basophils Relative: 0 % (ref 0–1)
Basophils Relative: 0 % (ref 0–1)
Eosinophils Absolute: 0.3 10*3/uL (ref 0.0–0.7)
Eosinophils Absolute: 0.2 10*3/uL (ref 0.0–0.7)
Eosinophils Relative: 2 % (ref 0–5)
Eosinophils Relative: 2 % (ref 0–5)
Lymphocytes Relative: 15 % (ref 12–46)
Lymphs Abs: 1.7 10*3/uL (ref 0.7–4.0)
Monocytes Absolute: 0.9 K/uL (ref 0.1–1.0)
Monocytes Relative: 8 % (ref 3–12)
Neutro Abs: 8.1 K/uL — ABNORMAL HIGH (ref 1.7–7.7)
Neutrophils Relative %: 74 % (ref 43–77)
Neutrophils Relative %: 78 % — ABNORMAL HIGH (ref 43–77)

## 2010-05-21 LAB — CBC
HCT: 42 % (ref 36.0–46.0)
HCT: 41.7 % (ref 36.0–46.0)
Hemoglobin: 13.5 g/dL (ref 12.0–15.0)
Hemoglobin: 12.7 g/dL (ref 12.0–15.0)
MCH: 28.4 pg (ref 26.0–34.0)
MCHC: 32.1 g/dL (ref 30.0–36.0)
MCHC: 30.5 g/dL (ref 30.0–36.0)
MCV: 88.4 fL (ref 78.0–100.0)
Platelets: 271 10*3/uL (ref 150–400)
Platelets: 279 10*3/uL (ref 150–400)
RBC: 4.75 MIL/uL (ref 3.87–5.11)
RBC: 4.67 MIL/uL (ref 3.87–5.11)
RBC: 4.74 MIL/uL (ref 3.87–5.11)
RDW: 16.6 % — ABNORMAL HIGH (ref 11.5–15.5)
RDW: 17 % — ABNORMAL HIGH (ref 11.5–15.5)
WBC: 10.9 K/uL — ABNORMAL HIGH (ref 4.0–10.5)
WBC: 10.3 10*3/uL (ref 4.0–10.5)
WBC: 8.5 10*3/uL (ref 4.0–10.5)

## 2010-05-21 LAB — PROTIME-INR
INR: 1.33 (ref 0.00–1.49)
Prothrombin Time: 16.7 seconds — ABNORMAL HIGH (ref 11.6–15.2)

## 2010-05-21 LAB — BASIC METABOLIC PANEL
BUN: 9 mg/dL (ref 6–23)
Chloride: 95 mEq/L — ABNORMAL LOW (ref 96–112)
GFR calc Af Amer: >60 mL/min (ref >60)
GFR calc non Af Amer: >60 mL/min (ref >60)
Potassium: 2.9 mEq/L — ABNORMAL LOW (ref 3.5–5.1)
Sodium: 139 mEq/L (ref 135–145)

## 2010-05-21 NOTE — H&P (Signed)
Hospital Admission Note Date: 05/21/2010  Patient name: [name] Medical record number: [id number] Date of birth: 1945-04-11 Age: 65 y.o. Gender: female PCP: Mliss Sax, MD  Medical Service: Internal Medicine  Attending physician:  Dr. Julaine Fusi  Pager: Resident [phone]): Dr. Baltazar Apo   Pager: [phone] Resident (R1): Dr. Dorthula Rue   Pager: [phone]  Chief Complaint:left leg swelling  History of Present Illness: 65 yo woman with PMH of CHF, HTN, chronic back pain s/p multiple surgeries, OA, hx pf PE, depression, COPD on home oxygen and nebulizer as needed presents to [facility] ED for pain and swelling in her left leg in the past 4 days.  Patient normally has swelling in her legs; however, she reports that the pain and swelling in her left leg is worsened with movement.  She denies any increase in SOB, chest pain, fever, cough, or urinary symptoms.  Patient is mostly immobilized at home 2/2 to her weight and pain.    Current Outpatient Prescriptions  Medication Sig Dispense Refill  . albuterol (PROVENTIL HFA) 108 (90 BASE) MCG/ACT inhaler Inhale 2 puffs into the lungs every 4 (four) hours as needed.        . Ferrous Sulfate (IRON) 325 (65 FE) MG TABS Take 1 tablet by mouth 3 (three) times daily.        . furosemide (LASIX) 40 MG tablet Take 40 mg by mouth daily.        Marland Kitchen gabapentin (NEURONTIN) 100 MG capsule Take 100 mg by mouth 3 (three) times daily.        Marland Kitchen ipratropium (ATROVENT) 0.06 % nasal spray 1 spray by Nasal route 3 (three) times daily.        Marland Kitchen lamoTRIgine (LAMICTAL) 100 MG tablet Take 100 mg by mouth daily.        Marland Kitchen LORazepam (ATIVAN) 1 MG tablet Take one tablet in morning and two tablets by mouth at bedtime       . omeprazole (PRILOSEC) 20 MG capsule Take 20 mg by mouth daily.        Marland Kitchen oxyCODONE-acetaminophen (PERCOCET) 10-325 MG per tablet Take 1 tablet by mouth every 6 (six) hours as needed.        . pravastatin (PRAVACHOL) 40 MG tablet Take 40 mg by mouth at bedtime.          . promethazine (PHENERGAN) 25 MG tablet Take 25 mg by mouth every 12 (twelve) hours as needed.        Marland Kitchen QUEtiapine (SEROQUEL) 200 MG tablet Take 400 mg by mouth at bedtime.        . sertraline (ZOLOFT) 100 MG tablet Take 200 mg by mouth daily.          Allergies: Chlorpromazine hcl; Codeine; Morphine; and Sulfonamide derivatives  Past Medical History  Diagnosis Date  . COPD (chronic obstructive pulmonary disease)   . Depression     followed by Dr. Gwyndolyn Kaufman, [facility]  . GERD (gastroesophageal reflux disease)   . Hypertension   . Hyperlipidemia   . Basal cell carcinoma 09/2001    followed by Dr. Karlyn Agee  . Interstitial cystitis     followed by Dr. Isabel Caprice  . Irritable bowel syndrome     colonoscopy 12/2003 and EGD 1997 WNL, followed by Dr. Marina Goodell  . Spinal stenosis     L3-L4, L4-L5 per MRI 12/2004, status post lumbar decompression 02/2007 by Dr. Gerlene Fee  . Pulmonary embolism 10/2007    on coumadin and followed by Dr. Alexandria Lodge  .  OSA on CPAP     noncompliance  . S/P mastectomy 1993    bilateral. status post reconstruction in 1994 by Dr. Benna Dunks  . S/P rhinoplasty 1993  . History of back surgery 02/2007    Past Surgical History  Procedure Date  . Cervical discectomy 11/1995 and 05/2002    by Dr. Gerlene Fee  . Abdominal hysterectomy 1976  . Bladder suspension 1993  . Orif ankle fracture 1993  . Lumbar laminectomy/decompression microdiscectomy 02/2007    by Dr. Gerlene Fee    Family History  Problem Relation Age of Onset  . Heart disease Mother   . Cancer Brother     Social History   Divorced, lives alone and has a home health aid who comes 5 days per week.  Smokes 1/2 ppd x 20 years, denies alcohol or recreational drugs. Insurance: medicaid or medicare    Review of Systems: As per HPI  Physical Exam: T 98.5    BP 115/73   P 83  RR20  O2 sat 98% 2L [place]  GEN: tearful, NAD, speaking in complete sentences HEENT: PERRLA, EOMI, pink and moist pharynx Heart:RRR, no m/g/r Lungs:  CTAB, no wheezes, no crackles Abd: obese, soft, ND, NT, normoactive BS Ext: left LE +2-3 nonpitting edema with erythema, tenderness, warm to touch. There is RLE edema but << than left.    +1 DP pulse on R, could not palpate on left 2/2 to swelling GU: erythematous patches on inguinal folds and inner thighs, no drainage or tenderness Neuro: nonfocal, Alert and oriented x3, CN II-XII grossly intact Psych: tearful and anxious; otherwise, appropriate judgement/insight  Lab results:  Sodium (NA)                              137               135-145          mEq/L  Potassium (K)                            3.7               3.5-5.1          mEq/L  Chloride                                 94         l      96-112           mEq/L  CO2                                      35         h      19-32            mEq/L  Glucose                                  126        h      70-99            mg/dL  BUN  11                6-23             mg/dL  Creatinine                               0.83              0.4-1.2          mg/dL  GFR, Est Non African American            >60               >60              mL/min  GFR, Est African American                >60               >60              mL/min    Oversized comment, see footnote  1  Calcium                                  8.6               8.4-10.5         Mg/dL   Protime ( Prothrombin Time)              15.6       h      11.6-15.2        seconds  INR                                      1.22              0.00-1.49  PTT(a-Partial Thromboplastn Time)        33                24-37            Seconds   Color, Urine                             AMBER      a      YELLOW    BIOCHEMICALS MAY BE AFFECTED BY COLOR  Appearance                               CLOUDY     a      CLEAR  Specific Gravity                         1.035      h      1.005-1.030  pH                                       6.0               5.0-8.0  Urine  Glucose  NEGATIVE          NEG              mg/dL  Bilirubin                                SMALL      a      NEG  Ketones                                  TRACE      a      NEG              mg/dL  Blood                                    MODERATE   a      NEG  Protein                                  30         a      NEG              mg/dL  Urobilinogen                             1.0               0.0-1.0          mg/dL  Nitrite                                  POSITIVE   a      NEG  Leukocytes                               SMALL      a      NEG  Microscopy: Squamous Epithelial / LPF                FEW        a      RARE  Casts / HPF                              SEE NOTE.  a      NEG    HYALINE CASTS  WBC / HPF                                7-10              <3               WBC/hpf  RBC / HPF                                3-6               <3  RBC/hpf  Bacteria / HPF                           MANY       a      RARE  Urine-Other                              SEE NOTE.    MUCOUS PRESENT  Assessment & Plan by Problem:  1. Leg pain/swelling: likely DVT vs. Cellulitis.  Given patient's past history of PE, immobilization, and clinical exam, it is consistent with left DVT.  Cellulitis is also possible given her tenderness and increase in warthm, and erythema on left lower extremity but she is afebrile and no leukocytosis; therefore, will hold off on antibiotics at this time. -Admit to regular floor -Will get LE venous doppler in AM to rule out DVT -Start Full dose Lovenox and bridging with Coumadin -Check PT/INR -Will get social work to help set up home health nurse who can draw PT/INR level at home since patient has transportation issues and also home PT/OT -Pain control with Oxycodone 15mg  q4hr prn, and Dilaudid 1mg  IV q4h prn  2. HTN: stable, will continue Lasix 40mg  bid   3. GERD: will continue prilosec 40mg  poqd  4. Depression- continue  Seroquel and Zoloft  5. Insomnia- continue Lorazapam 2mg  po qhs  6. Obstructive sleep apnea: continue to monitor, non-compliance with CPAP  7. COPD: stable, on 2L home O2 and nebs prn  8. Diastolic Heart Failure: stable. Continue Lasix 40mg  bid  9. OA: planned surgery, will probably defer at this time in setting of acute illness.   R2, Ankit Garg___________________________[phone]   05/21/10 at 12:50AM  R1, Elon Jester Ho__________________________[phone] 05/21/10 at 12:50 AM

## 2010-05-22 LAB — BASIC METABOLIC PANEL
CO2: 33 mEq/L — ABNORMAL HIGH (ref 19–32)
Chloride: 97 mEq/L (ref 96–112)
Chloride: 98 mEq/L (ref 96–112)
Creatinine, Ser: 0.71 mg/dL (ref 0.4–1.2)
GFR calc Af Amer: >60 mL/min (ref >60)
GFR calc Af Amer: >60 mL/min (ref >60)
Potassium: 3.2 mEq/L — ABNORMAL LOW (ref 3.5–5.1)
Potassium: 4.3 mEq/L (ref 3.5–5.1)

## 2010-05-22 LAB — CBC
HCT: 41 % (ref 36.0–46.0)
MCH: 27.6 pg (ref 26.0–34.0)
MCV: 89.9 fL (ref 78.0–100.0)
RDW: 16.5 % — ABNORMAL HIGH (ref 11.5–15.5)
WBC: 6.5 10*3/uL (ref 4.0–10.5)

## 2010-05-22 LAB — URINE CULTURE: Colony Count: >=100000

## 2010-05-22 LAB — APTT: aPTT: 57 seconds — ABNORMAL HIGH (ref 24–37)

## 2010-05-23 LAB — URINE CULTURE: Colony Count: >=100000

## 2010-05-23 LAB — COMPREHENSIVE METABOLIC PANEL
ALT: 16 U/L (ref 0–35)
AST: 23 U/L (ref 0–37)
Albumin: 2.5 g/dL — ABNORMAL LOW (ref 3.5–5.2)
CO2: 38 mEq/L — ABNORMAL HIGH (ref 19–32)
Calcium: 8.8 mg/dL (ref 8.4–10.5)
GFR calc Af Amer: >60 mL/min (ref >60)
GFR calc non Af Amer: >60 mL/min (ref >60)
Sodium: 142 mEq/L (ref 135–145)

## 2010-05-23 LAB — PROTIME-INR: INR: 1.66 — ABNORMAL HIGH (ref 0.00–1.49)

## 2010-05-23 LAB — CBC
HCT: 39 % (ref 36.0–46.0)
MCV: 89.9 fL (ref 78.0–100.0)
Platelets: 279 10*3/uL (ref 150–400)
RBC: 4.34 MIL/uL (ref 3.87–5.11)
WBC: 6.4 10*3/uL (ref 4.0–10.5)

## 2010-05-24 LAB — CBC
HCT: 39.7 % (ref 36.0–46.0)
Hemoglobin: 12 g/dL (ref 12.0–15.0)
MCH: 26.8 pg (ref 26.0–34.0)
MCHC: 30.2 g/dL (ref 30.0–36.0)
MCV: 88.6 fL (ref 78.0–100.0)

## 2010-05-24 LAB — PROTIME-INR: Prothrombin Time: 24.4 seconds — ABNORMAL HIGH (ref 11.6–15.2)

## 2010-05-28 ENCOUNTER — Telehealth: Payer: Self-pay | Admitting: *Deleted

## 2010-05-28 NOTE — Telephone Encounter (Signed)
Pt has been disch to a facility, they have called for clarification on pt's ativan and seroquel... Have these meds changed? Pt states she takes 1 ativan 1mg   in am and 2 ativan in pm, she states she takes seroquel 200mg   2 at bedtime. Please advise

## 2010-05-28 NOTE — Telephone Encounter (Signed)
[name], that is correct, [name] is taking Ativan and seroquel as you mentioned in your note.Please let me know if you need me to do anything. Thank you Sherlon Handing

## 2010-06-04 NOTE — Discharge Summary (Signed)
[name], [name]              ACCOUNT NO.:  [id number]   MEDICAL RECORD NO.:  [id number]          PATIENT TYPE:  INP   LOCATION:  4738                         FACILITY:  [facility]   PHYSICIAN:  Manning Charity, MD     DATE OF BIRTH:  12-Jun-1945   DATE OF ADMISSION:  11/10/2007  DATE OF DISCHARGE:  11/16/2007                               DISCHARGE SUMMARY   DISCHARGE DIAGNOSES:  1. Shortness of breath and hypoxemia secondary to pulmonary embolism.  2. Altered mental status secondary to hypercarbia.  3. Urinary tract infection.  4. Depression.  5. Hypokalemia.  6. Obstructive sleep apnea.  7. Gastroesophageal reflux disease.  8. Back pain.  9. Hyperlipidemia.  10.Obesity.  11.Irritable bowel syndrome with a normal colonoscopy in 2005 and a      normal colonoscopy in 1997.  12.Anemia with an anemia panel consistent with iron deficiency.   DISCHARGE MEDICATIONS:  1. Lipitor 80 mg 1 tablet by mouth daily.  2. Zoloft 100 mg 2 tablets by mouth at bedtime.  3. Omeprazole 20 mg 1 tablet by mouth daily.  4. Neurontin 100 mg 1 tablet by mouth 3 times daily.  5. Lamictal 100 mg 1 tablet by mouth daily.  6. Seroquel 200 mg 1 tablet by mouth daily at bedtime.  7. Coumadin 5 mg 1 tablet by mouth daily.  8. Ativan 1 mg 1 tablet by mouth 3 times per day.  9. Combivent 103/18 mcg 2 puff inhale 3 times daily.  10.Ferrous sulfate 325mg  I tab by mouth twice daily.   The patient was instructed to bring her medications with her to the next  appointment.   DISPOSITION AND FOLLOWUP:  The patient had been discharged in a stable  condition with hospital followup on December 07, 2007, at 4 p.m. by Dr.  Ned Grace, primary care physician at [facility].  During that appointment, it is going to be important to check a basic  metabolic panel in order to evaluate the patient's renal function and  also electrolyte status.  It is going to be important to check the  patient's  depression medications and adjust it as needed.  It is going  to be also important during that appointment to check the patient's  respiratory status and arrange for pulmonary function tests.   PROCEDURES PERFORMED DURING THIS ADMISSION:  [name] had chest x-ray  that demonstrated cardiomegaly, vascular congestion, and a stable  chronic peribronchial thickening and interstitial prominence, changes  consistently with COPD.  We have also minimal bibasilar atelectasis with  increasing interstitial pattern compatible with edema and congestive  heart failure.  Those are the results of chest x-ray on November 10, 2007, and November 11, 2007.  During this admission, the patient had  abdominal ultrasound that demonstrated cholelithiasis without evidence  for cholecystitis, irregularity of the aorta suggesting atherosclerotic  disease without aneurysm and also poor visualization of the pancreas,  but no findings or changes for pancreatitis.  The patient had CT angio  of the chest which was positive for pulmonary embolism involving the  superior segment of the right  lower lobe pulmonary artery.  It also  showed mild pulmonary edema.  There are findings consistent with  gallstones without other CT scan evidence of acute cholecystitis.  The  patient had an abdominal series on November 13, 2007, that was completely  normal without obstructive bowel pattern.  The patient had also a 2-D  echo that demonstrated overall left ventricular systolic function  without wall motion abnormality and ejection fraction ranging between 65  and 70%.  There was mild focal basal septal hypertrophy.  There was  Doppler evidence for dynamic left ventricular outflow tract obstruction  with a peak velocity of 2 mm/sec and with a peak gradient of 16 mmHg.  There was a moderate size left pleural effusion visualized of this 2-D  echo.  There was no pericardial effusion.  No other procedures were  performed.    CONSULTATIONS:  Dr. Peter Swaziland consulted for the 2-D echo.   BRIEF HISTORY OF PRESENT ILLNESS, VITAL SIGNS, AND LABS AT THE MOMENT OF  ADMISSION:  [name] is a 65 year old white woman with past medical  history of interstitial lung disease, obstructive sleep apnea,  noncompliant with her CPAP, and COPD with ongoing tobacco abuse who  presents with 3-week history of increased shortness of breath.  She was  seen in the outpatient clinic and was restarted on her Lasix for  treatment of her congestive diastolic dysfunction.  She continued to get  worse to the point that she was almost immobile.  Also, she think her  lower extremity edema is worse.  There is no increased pain on her legs.  No chest pain, palpitations, nausea, vomiting, or diaphoresis at the  moment of presentation.  She is not wearing her CPAP for the last 2-3  months since it broke down.  The patient got more sleepy especially  during the days for the last 3 days prior to admission.  The patient  states that she saw Dr. Shelle Iron in October 2009 who again advised her to  stop smoking, to increase her level of exercise in order to lose weight,  and have scheduled her for pulmonary function tests during the next  month.  At the moment of admission, the patient's vital signs were  temperature 97.8, blood pressure 106/59, heart rate 75, respiratory rate  20, and oxygen saturation was 86% on room air.   At admission, her labs showed sodium of 141, potassium 3.0, chloride 96,  bicarb 35, BUN 1.0, creatinine 1.1, and blood sugar 102.  The patient  had white blood cells of 7.2, hemoglobin 11.6, MCV 85.4, and platelets  337.  The patient had a BMP of 109.  The patient had urinalysis which  was cloudy with a small amount of blood, positive nitrite, and small  leukocytes.  Microscopy showed many bacteria, 3-6 white blood cells, 3-6  red blood cells per field, and a few squamous epithelial casts.  Urine  drug screen was positive for  benzodiazepines and positive for opiates.  The patient had a D-dimer of 1.80 and acetaminophen level was less than  10.  She had an alcohol level of less than 5.  Lipase of 13.  She had  salicylate level of less than 4.  Normal cardiac enzymes.  TSH 2.356 and  rheumatoid factor less than 20.  The patient had p-ANCA that was  negative.  She had anti-DNA less than 1.  Scleroderma antibody IgG less  than 0.2.  Centromere antibodies more than 8.  Phosphorus 3.0 and  magnesium 2.4.  The patient had an anemia panel with reticulocyte  percentage of 1.1, iron of 16 with TIBC of 307, vitamin B12 of 425,  serum folate 10.1, ferritin of 13.   HOSPITAL COURSE:  1. Shortness of breath and hypoxemia secondary to pulmonary embolism.      The patient had elevated D-dimer, shortness of breath, and a      positive CT angio for pulmonary embolism.  Treatment using heparin      and Coumadin was started after 5 days of combined treatment with      heparin and Coumadin and also at the moment that the Coumadin was      in therapeutic range.  The patient was discharged using just      Coumadin and with arrangements to see Dr. Erline Levine at the Coumadin      Clinic in the outpatient clinic of [facility] in order to      continue adjusting her Coumadin level on a daily basis.  By the      moment that the patient was discharged, she was no longer short of      breath.  She was with a good oxygen saturation in the 93-94% on      room air and was able to ambulate without presenting any dyspnea.   1. UTI.  The patient received 5 days of treatment using ciprofloxacin      while she was admitted and second urinalysis performed on November 14, 2007, was completely normal without showing any leukocytes.      Decision to stop treatment with the ciprofloxacin was made      especially because the patient was receiving Coumadin for her PE      and we are bridging for a therapeutic level that will be affected       by the ciprofloxacin.   1. Anxiety.  Decision was to continue using Ativan.   1. Depression.  The patient continue using her home regimen of      Lamictal, Zoloft, and Seroquel. Followup as an outpatient for      medication adjustment will be arranged.   1. Anemia.  After anemia panel was performed, iron deficiency anemia      was demonstrated.  The patient was started on iron pill and was      instructed to continue using iron over the counter 325 mg twice a      day.  This is something that we are going to continue checking as      an outpatient.   1. IBS: The patient developed abdominal pain and diarrhea while she      was in the hospital.  Clostridium difficile antigen was checked and      it was negative.  Lipase was also negative and abdominal series was      performed demonstrating no acute findings.  Since the patient has a      history of irritable bowel syndrome, the decision to treat      empirically with loperamide was made.  After 2 days of treatment,      the patient was feeling much better.  Abdominal pain was completely      resolved and she was tolerating p.o. without any difficulty.  Plan      is to continue monitoring her electrolytes and we will try to      increase the amount of fiber in her diet in  order to improve even      more the condition of her irritable bowel syndrome.   1. COPD. During this admission, the patient received Combivent for her      COPD and she responds pretty well to it, requesting prescription at      the moment of discharge to continue using that medication.  The      patient was given.  The patient is going to continue using her      Combivent 2 puffs 3 times per day and as an outpatient we are going      to arrange for pulmonary function tests.   At discharge, the patient's vital signs, 98.9 temperature, blood  pressure 138/81, heart rate 72, respiratory rate 20, and oxygen  saturation 95% on room air.  White blood cells 7.0,  hemoglobin 11.0, and  platelets 288.  Sodium 145, potassium 4.1, chloride 111, bicarb 29, BUN  6, creatinine 0.65, and blood sugar 113.      Jennifer Randy, MD  Electronically Signed      Manning Charity, MD  Electronically Signed    CEM/MEDQ  D:  12/06/2007  T:  12/07/2007  Job:  [id number]   cc:   Alvester Morin, M.D.

## 2010-06-04 NOTE — Op Note (Signed)
NAMELERONDA, LEWERS              ACCOUNT NO.:  [id number]   MEDICAL RECORD NO.:  [id number]          PATIENT TYPE:  INP   LOCATION:  3008                         FACILITY:  [facility]   PHYSICIAN:  Reinaldo Meeker, M.D. DATE OF BIRTH:  02-Jun-1945   DATE OF PROCEDURE:  03/16/2007  DATE OF DISCHARGE:                               OPERATIVE REPORT   PREOPERATIVE DIAGNOSIS:  Spinal stenosis L3-L4 and L4-L5.   POSTOPERATIVE DIAGNOSIS:  Spinal stenosis L3-L4 and L4-L5.   PROCEDURE:  1. L3-L4 and L4-L5 decompressive laminectomy.  2. Repair of dural laceration.   SURGEON:  Reinaldo Meeker, M.D.   ASSISTANT:  Kathaleen Maser. Pool, M.D.   SURGICAL INDICATIONS:  Ms. Terrero is a 65 year old female with back and  right than left lower extremity pain.  She was tried on conservative  therapy without improvement and eventually underwent myelography which  showed stenosis at L4-L5 with extension almost up to the L3-L4 level.  She is admitted at this time for a complete L4-L5 decompression with  removal of the inferior edge of L3.  Prior to surgery, the patient had a  long discussion regarding all the risks and benefits of surgical  intervention.  The risks discussed included but were not limited to  bleeding, infection, spinal fluid leak, and death.  We have discussed  alternative methods of therapy and offered the risks and benefits of non-  intervention.  Ms. Riester has had the opportunity to ask questions and  appears to understand.  With this information on hand, she has requested  that we proceed with surgery.  She is brought to the operating today for  the procedure.   PROCEDURE IN DETAIL:  After being placed in the prone position, the  patient's back was prepped and draped in the usual sterile fashion.  A  localizing x-ray was taken prior to incision to identify the appropriate  level.  Due to the patient's large body habitus, it was very difficult  to get radiologic localization. With  multiple x-rays and fluoroscopy, we  were able to feel comfortable and were at the appropriate levels.  A  midline incision was made above the spinous process of L3, L4, and L5  and using the Bovie cutting current, the incision was carried on the  spinous processes.  Subperiosteal dissection was then carried out  bilaterally on the spinous processes, lamina, and facet joints.  A self-  retaining retractor was placed for exposure.  The inferior half of the  spinous process of L3 along with the complete spinous process of L4 and  L5 were removed.  Starting on the patient's left side, a generous  laminotomy was performed by removing the entire L4 lamina, most of the  L5 lamina, and the inferior 1/3 of the L3 lamina.  Residual bone and  hypertrophic ligamentum flavum were removed in a piecemeal fashion.  A  similar decompression was then carried out on the opposite side.  In the  process of removing some lamina of L5, a dural rent was encountered, but  was not through the arachnoid.  It was elected at  the end of the case to  stitch this closed with two stitches and then seal it with Duragen and  Tisseel.  When the decompression had been completed on the right side,  the residual midline structures were removed to complete the bilateral  decompression of L4-L5 and removal of the inferior third of L3.  Nerve  roots were tracked out their foramina and found to be well decompressed.  To confirm once more that we were at the appropriate levels, the sacrum  was exposed partially and it was felt that we were at the correct place.  At this time, large amounts of irrigation were carried out.  Any  bleeding was controlled with bipolar coagulation and Gelfoam.  Duragen  was placed over the dural repair along with Tisseel.  The wound was then  closed with the fascia.  Due to the patient's large body habitus, a  suprafascial drain was left and brought out through a separate stab  wound incision.  The fat  layer was then closed in multiple layers of 2-0  and 3-0 Vicryl and then staples were placed on the skin.  A sterile  dressing was then applied and the patient was extubated and taken to the  recovery room in stable condition.           ______________________________  Reinaldo Meeker, M.D.     ROK/MEDQ  D:  03/16/2007  T:  03/16/2007  Job:  [id number]
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
## 2010-06-04 NOTE — Consult Note (Signed)
[name], [name]              ACCOUNT NO.:  [id number]   MEDICAL RECORD NO.:  [id number]          PATIENT TYPE:  INP   LOCATION:  2608                         FACILITY:  [facility]   PHYSICIAN:  Jordan Hawks. Elnoria Howard, MD    DATE OF BIRTH:  [birth year]   DATE OF CONSULTATION:  07/09/2008  DATE OF DISCHARGE:                                 CONSULTATION   REFERRING PHYSICIAN:  This is a unassigned teaching service patient.   REASON FOR CONSULTATION:  Microcytic anemia, heme-positive stool.   HISTORY OF PRESENT ILLNESS:  This is a 65 year old female with a past  medical history of morbid obesity, COPD, depression, gastroesophageal  reflux disease, hyperlipidemia, iron-deficiency anemia, basal cell skin  cancer, irritable bowel syndrome, interstitial cystitis, pulmonary  embolism in October 2009 on Coumadin, and obstructive sleep apnea was  admitted to the hospital with complaints of left leg pain.  The patient  states that pain was in her left knee and she was unable to lift it.  She subsequently presented to the clinic for further evaluation and  treatment.  Subsequently, it was found that she had a significant anemia  with hemoglobin of 6.3.  She was admitted to the hospital for further  evaluation and treatment.  A lower extremity Doppler was performed,  there was no evidence of any DVTs and the patient has been receiving  blood transfusion.  She denied taking any NSAID medications.  No  complaints of any hematemesis, hematochezia, or melena, although she  states that her stools have been a bit darker for the past month. In  2005, she underwent EGD and colonoscopy for some mild complaints of  abdominal discomfort and a screening colonoscopy, all of which was  negative and this was performed by Dr. Danise Edge.   Past medical history and past surgical history is as stated above.   Family history is noncontributory.   Social history is positive for tobacco.  No alcohol or illicit drug  use.   MEDICATIONS:  1. Gabapentin 100 mg p.o. t.i.d.  2. Ativan 1 mg p.o. b.i.d.  3. Nicotine 21 mg transdermally.  4. Protonix 40 mg daily.  5. Seroquel 200 mg nightly.  6. Crestor 40 mg daily.  7. Zoloft 200 mg daily.  8. Albuterol 1-2 puffs inhaler q.4 h. P.r.n.   ALLERGIES:  SULFA, CODEINE, THORAZINE, and MORPHINE SULFATE.   PHYSICAL EXAMINATION:  VITAL SIGNS:  Stable.  GENERAL:  The patient is in no acute distress, alert and oriented.  HEENT:  Normocephalic, atraumatic.  Extraocular muscles intact.  NECK:  Supple.  No lymphadenopathy.  LUNGS:  Clear to auscultation bilaterally.  CARDIOVASCULAR:  Regular rate and rhythm.  ABDOMEN:  Some mild right upper quadrant tenderness.  No rebound,  rigidity.  Positive bowel sounds.  EXTREMITIES:  No clubbing, cyanosis, or edema.   LABORATORY VALUES:  White blood cell count is 7.0, hemoglobin 7.5, MCV  73.7, platelets at 301.  PT is 46.1, INR 4.4.  Sodium is 139, potassium  3.2, chloride 106, CO2 of 27, glucose 110, BUN 9, creatinine 0.6.  Ferritin is less than 1.  Percent saturation is 3%.   IMPRESSION:  1. Heme-positive stool.  2. Iron-deficiency anemia.  3. Multiple other medical problems.   At this time, further evaluation is required.  Since she has some mild  right upper quadrant pain, I will perform an EGD for further evaluation  and treatment provided that her INR is within acceptable range.  If the  EGD is negative, then a colonoscopy will be performed.      Jordan Hawks Elnoria Howard, MD  Electronically Signed     PDH/MEDQ  D:  07/09/2008  T:  07/10/2008  Job:  [id number]
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
## 2010-06-07 NOTE — Discharge Summary (Signed)
NAMECAMRON, [name]              ACCOUNT NO.:  [id number]   MEDICAL RECORD NO.:  [id number]          PATIENT TYPE:  INP   LOCATION:  3008                         FACILITY:  [facility]   PHYSICIAN:  Reinaldo Meeker, M.D. DATE OF BIRTH:  1945-06-06   DATE OF ADMISSION:  03/16/2007  DATE OF DISCHARGE:  03/25/2007                               DISCHARGE SUMMARY   PRIMARY DIAGNOSIS:  Spinal stenosis L3-L4, L4-L5.   PRIMARY OPERATIVE PROCEDURE:  L3-L4, L4-L5 decompressive laminectomy.   HISTORY:  [name] is a 65 year old female with back and bilateral  lower extremity pain.  MRI scan shows stenosis L4-L5 with some extension  almost up to the L3-L4 level.  She is now here for an L4-L5  decompression with removal to the inferior range of L3.  On February 24,  the patient was taken to operating room where she underwent the above-  mentioned procedure.  She tolerated it well.  After surgery, she did a  dural tear.  At the time surgery, she was kept flat in bed for 2 days.  When began to mobilize further, it was a great deal of difficulty.  Of  note is the fact that the patient is morbidly obese and weighs almost  400 pounds and lot of difficulty getting up, it was felt to be secondary  to this issue.  As she was slowly able to increase her activities,  options were discussed.  The idea of a skilled nursing facility was  addressed and we actually got an FL-2 filled out and arranged this for  her.  At the last moment, however, the patient said that she did not  want to go to the skilled nursing facility, would prefer to go back to  her home where it is actually a group living facility.  Therefore, on  March 5, once arrangements were made for her at home with a pushup  chair, bedside commode, and walker, it was felt that she could be  discharged home.  Her condition was steadily improving.  Wound looked  good at that time.   PLAN:  The patient is to return to the office in approximately 1  week's  time for additional followup.           ______________________________  Reinaldo Meeker, M.D.     ROK/MEDQ  D:  04/22/2007  T:  04/23/2007  Job:  [id number]

## 2010-06-07 NOTE — Op Note (Signed)
NAMEHINDA, Jennifer              ACCOUNT NO.:  [id number]   MEDICAL RECORD NO.:  [id number]          PATIENT TYPE:  AMB   LOCATION:  ENDO                         FACILITY:  [facility]   PHYSICIAN:  Danise Edge, M.D.   DATE OF BIRTH:  1945/03/15   DATE OF PROCEDURE:  12/21/2003  DATE OF DISCHARGE:                                 OPERATIVE REPORT   PROCEDURE:  Esophagogastroduodenoscopy and colonoscopy.   INDICATIONS FOR PROCEDURE:  Ms. [name] is a 66 year old female born  06-Mar-1945.  In 2000, she underwent an esophagogastroduodenoscopy with  small bowel biopsies plus colonoscopy with random colonic biopsies to  evaluate diarrhea.  The exam and biopsies were normal except for the removal  of a 2 mm adenomatous polyp from her rectum.  She is scheduled for  surveillance colonoscopy with polypectomy to prevent colon cancer.  She also  requests a repeat esophagogastroduodenoscopy to evaluate abdominal bloating.   ENDOSCOPIST:  Danise Edge, M.D.   PREMEDICATION:  Versed 15 mg, Demerol 150 mg.   PROCEDURE:  Diagnostic esophagogastroduodenoscopy:  After obtaining informed  consent, [name] was placed in the left lateral decubitus position.  I  administered intravenous Demerol and intravenous Versed to achieve conscious  sedation for the procedure.  Patient's blood pressure, oxygen saturation,  and cardiac rhythm were monitored throughout the procedure and documented in  the medical record.   The Olympus gastroscope was passed through the posterior hypopharynx and the  proximal esophagus without difficulty.  The hypopharynx, larynx, and vocal  cords appeared normal.   ESOPHAGOSCOPY:  The proximal mid and lower segments of the esophageal mucosa  appear completely normal.   GASTROSCOPY:  Retroflexed view of the gastric cardia and fundus was normal.  The gastric body, antrum, and pylorus appeared normal.   DUODENOSCOPY:  The duodenal bulb, mid duodenum, and distal  duodenum appeared  normal.   ASSESSMENT:  Normal esophagogastroduodenoscopy.   PROCEDURE:  Proctocolonoscopy to the cecum:  Anal inspection and digital  rectal exam was normal.  The Olympus adjustable pediatric colonoscope was  introduced into the rectum and advanced to the cecum.  Colonic preparation  for the exam today was satisfactory.   RECTUM:  Normal.   SIGMOID COLON/DESCENDING COLON:  Normal.   SPLENIC FLEXURE:  Normal.   TRANSVERSE COLON:  Normal.   HEPATIC FLEXURE:  Normal.   ASCENDING COLON:  Normal.   CECUM AND ILEOCECAL VALVE:  Normal.   ASSESSMENT:  Normal screening proctocolonoscopy to the cecum.  No endoscopic  evidence for the presence of recurrent colorectal neoplasia.   RECOMMENDATIONS:  Repeat colon polyp screen in five years by either virtual  colonoscopy or surveillance optical colonoscopy.      MJ/MEDQ  D:  12/21/2003  T:  12/21/2003  Job:  [id number]   cc:   Alvester Morin, M.D.  [address]  [place]  [place] [address]  Fax: [phone]
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
## 2010-06-07 NOTE — Op Note (Signed)
[name], [name]                        ACCOUNT NO.:  [id number]   MEDICAL RECORD NO.:  [id number]                   PATIENT TYPE:  OIB   LOCATION:  2899                                 FACILITY:  [facility]   PHYSICIAN:  Reinaldo Meeker, M.D.              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   PROCEDURE:  C6-7 anterior cervical diskectomy with bone bank fusion,  followed by a Zephyr anterior cervical plating with the operating  microscope.   SURGEON:  Reinaldo Meeker, M.D.   ASSESSMENT:  [name], M.D.   DESCRIPTION OF PROCEDURE:  After being placed in the supine position, in 5  pounds of Holter traction, the patient's neck was prepped and draped in the  usual sterile fashion.  A localizing x-ray was taken prior to the incision,  to identify the appropriate level.  A transverse incision was then made in  the right anterior neck, starting at the midline and headed towards the  medial aspect of the sternocleidomastoid muscle.  The platysma muscle was  then incised transversely.  The natural fascial plane between the strap  muscles medially and the sternocleidomastoid laterally was identified and  followed down to the anterior aspect of the cervical spine.  The longus coli  muscles were identified and split in the midline and stripped away  bilaterally with the Kitner retractor and key elevator.  A second x-ray was  taken which showed an approach through the appropriate level.  Using a #15  blade the annulus of the disk was incised.  Using the high-speed drill the  interspace was widened and carried down to the deep lip of the vertebral  bodies.  After drilling a bit more, a small Kerrison punch was used to  remove the bony overgrowth away from the posterior longitudinal  ligament.  The ligament was then incised transversely, and the cut was removed with the  Kerrison punch.  Herniated disk material and partially calcified ligament  were then removed from the patient's right symptomatic site, to complete the  decompression of the C7 nerve root.  A similar decompression was carried out  towards the left, the asymptomatic side.  At this point, inspection was  carried out in all directions to find any evidence of residual compression.  None could be identified.  Measurements were taken and a 7 mm x 11 mm x 14  mm plug was reconstituted.  After irrigating once more and confirming  hemostasis, the plug was impacted without difficulty.  Appropriate length  Zephyr anterior  plate was then chosen.  Under fluoroscopic guidance the  drill holes were then placed, followed by the placement of 13 mm screws x4.  The locking mechanism was then  secured.  A final fluoroscopy showed that the  plate and plug and screws to all be in good position.  At this point large  amounts of irrigation were carried out and bleeding controlled with bipolar  coagulation.  The wound was then closed using interrupted Vicryl on the  platysma muscle and inverted #5-0 PDS on the subcuticular area, and Steri-  Strips on the skin.  A sterile dressing was then applied.  The patient was extubated and taken to the recovery room in stable  condition.                                               Reinaldo Meeker, M.D.    ROK/MEDQ  D:  06/15/2002  T:  06/15/2002  Job:  [id number]

## 2010-06-07 NOTE — Op Note (Signed)
[place]. [facility]  Patient:    [name], [name] Visit Number: [id number] MRN: [id number]          Service Type: OUT Location: [facility] Attending Physician:  Enos Fling Proc. Date: 09/08/00 Adm. Date:  09/08/2000                             Operative Report  PROCEDURE:  Lumbar puncture.  NEUROLOGIST:  Candy Sledge, M.D.  INDICATION:  Rule out demyelinating disease.  DESCRIPTION OF PROCEDURE:  After sterile preparation and local anesthesia at the L2-3 interspace, a 20-gauge spinal needle was inserted  without difficulty producing clear CSF at an opening pressure of 260 mm of H2O.  Approximately 12 cc were collected for routine studies including oligoclonal bands.  The patient tolerated the procedure well and without immediate complications. Attending Physician:  Enos Fling DD:  09/08/00 TD:  09/08/00 Job: [id number]

## 2010-06-10 NOTE — Discharge Summary (Signed)
NAMEMARCELYN, RUPPE              ACCOUNT NO.:  [id number]  MEDICAL RECORD NO.:  [id number]           PATIENT TYPE:  LOCATION:                                 FACILITY:  PHYSICIAN:  Tilford Pillar, MD     DATE OF BIRTH:  March 11, 1945  DATE OF ADMISSION:  05/20/2010 DATE OF DISCHARGE:  05/24/2010                              DISCHARGE SUMMARY   CONTINUITY MD:  Mliss Sax, MD  DISCHARGE DIAGNOSES: 1. Left leg deep venous thrombosis. 2. Urinary tract infection. 3. Hypokalemia. 4. Depression/anxiety. 5. History of pulmonary embolism in October 2009, treated with     warfarin until December 2010. 6. Iron-deficiency anemia with baseline hemoglobin of 13 secondary to     gastric antral vascular ectasias per colonoscopy in 2010 when     Coumadin was stopped. 7. Obstructive sleep apnea/obesity hypoventilation syndrome -     noncompliance with continuous positive airway pressure. 8. Left carpal tunnel syndrome status post surgery in March 2011. 9. Morbid obesity. 10.Chronic obstructive pulmonary disease with PFTs performed by Dr.     Delton Coombes in November 2010, consistent with mixed obstructive and restrictive disease. 11. Gastroesophageal reflux disease. 12. History of basal cell cancer, treated by Dr. Yetta Barre in September     2003. 13. Irritable bowel syndrome. 14. Fibromyalgia. 15. History of interstitial cystitis. 16. Ongoing tobacco abuse. 17. Spinal stenosis of L3-2 through L5 per MRI in December 2006 status     post lumbar decompression surgery in February 2009.  DISCHARGE MEDICATIONS WITH ACCURATE DOSES: 1. Ciprofloxacin 250 mg take one tablet by mouth twice daily for three     more days with the stop date of May 27, 2010. 2. Lovenox 150 mg take one injection subcutaneously twice daily for     one more day. 3. Ferrous sulfate 325 mg take one tablet by mouth twice daily. 4. Lasix 40 mg take one tablet by mouth once daily. 5. Neurontin 100 mg take one tablet by mouth twice  daily. 6. Lamotrigine 200 mg take 1 tablet by mouth daily. 7. Ativan take 1 mg by mouth every night as needed for anxiety. 8. Potassium chloride 20 mg take one tablet by mouth daily for 1     month. 9. Seroquel 200 mg take one tablet by mouth daily at night. 10.Zoloft 200 mg take one tablet by mouth daily. 11.Albuterol inhale 2 puffs every 6 hours as needed for shortness of     breath. 12.Warfarin 5 mg take one tablet by mouth for 2 days and then     adjustment in the dose needs to be done based on PT/INR.  DISPOSITION AND FOLLOWUP:  The patient was discharged for short-term rehab- [facility] in stable and improved condition from [facility] on May 24, 2010.  The patient was complaining of severe leg pain from her DVT and also given her chronic deconditioning with a lot of comorbidities, it was not safe for the patient to be discharged home although she was quite adamant on going home.  At that nursing facility,  her PT/INR needs to be checked every day and adjustment in the dose  of warfarin needs to be made to keep an INR in the therapeutic range of 2-3.   The patient's CBC should also be checked in 2-3 days because given her history of GI bleed in the past.The patient has also been scheduled to  follow up with [facility] with Dr. Tonny Branch on Jun 20, 2010, at 1:15 p.m.  PROCEDURES PERFORMED:  Dopplers of left leg which showed acute deep vein thrombosis in the left lower extremity.  Deep venous thrombosis was noted in common femoral and popliteal veins.  There was also some superficial thrombosis of the saphenous vein proximally and hypogastric vein.  CONSULTATIONS:  None.  HISTORY OF PRESENT ILLNESS:  This is a 65 year old woman with past medical history significant for CHF, hypertension, chronic back pain status post multiple surgeries, depression, COPD on home oxygen and nebulizer presented to [facility] ER for pain and swelling in her left leg in  the past 4 days.  The patient normally has swelling in her legs, however, she reports that her left leg swelling got progressively worse over this period of time.  She denies any increase in shortness of breath, chest pain, fever, cough, or urinary symptoms.  The patient is mostly immobilized at home secondary to her weight and pain.  PHYSICAL EXAMINATION:  VITAL SIGNS:  On presentation, temperature of 98.5, blood pressure 115/73, pulse 83, respiratory 20, O2 sats 98% on 2 L. GENERAL:  Tearful, in no acute distress, speaking in complete sentences. HEENT:  Pupils equal, round, reactive to light, extraocular movements intact, pink and moist pharynx. HEART:  Regular rate rhythm, no murmur, rubs, or gallops. LUNGS:  Clear to auscultation bilaterally, no wheezes or crackles. ABDOMEN:  Obese, soft, nondistended, nontender, normoactive bowel sounds. EXTREMITIES:  Left lower extremity +2 to 3 nonpitting edema with erythema, tenderness, warm to touch.  There is right lower extremity edema but very very less than left. GENITOURINARY:  Erythematous patches on the inguinal folds and inner thighs, no drainage or tenderness. NEUROLOGIC:  Nonfocal, alert, and oriented x3.  Cranial nerves II through XII intact. PSYCH:  Tearful and anxious, otherwise, appropriate judgment and insight.  ADMITTING LABORATORY DATA:  Sodium 137, potassium 3.7, chloride 94, bicarb 35, glucose 126, BUN 11, creatinine 0.83, calcium 8.6.  PT 15.6, INR 1.22.  UA was positive for nitrites and small leukocytes with microscopy showing many bacteria and 7-10 wbc's per high-power field. Her hemoglobin was 13.5, hematocrit 42, white cell count 10.9, and platelets of 271.  HOSPITAL COURSE BY PROBLEM: 1. Left leg DVT.  The patient presented with left leg pain and     swelling over a period of 4 days.  The patient had Dopplers which     confirmed the suspicion for acute deep vein thrombosis.  The     patient was started on Lovenox  and Coumadin bridging.  Her INR was     therapeutic at the time of discharge but after discussing with the     pharmacist, it was decided to bridge Lovenox and warfarin for a total     of  5 days. The patient was discharged on Lovenox and warfarin on day 4      of hospitalization at the SNF with the discharge instructions to adjust      the dose of warfarin based on PT/INR checks every day. 2. UTI.  The patient has history of interstitial cystitis and     presented with a UA and urine cultures consistent with UTI  again.     Her urine cultures grew Citrobacter which was sensitive to     ciprofloxacin.  The patient will be treated with ciprofloxacin for     7 days with the stop date being May 27, 2010. 3. Hypokalemia.  The patient was noted to have a low potassium during     her hospital stay which was thought likely in the setting of Lasix.     The patient's potassium was repleted, and she was discharged on     potassium chloride at SNF for 30 days. 4. Depression/anxiety.  The patient was tearful at times but denied     any suicidal ideations or homicidal ideation.  She was continued on     the medications that has been prescribed by behavioral health. 5. Hypertension.  The patient's blood pressure was fairly well     controlled.  She was continued on home dose of Lasix.  DISCHARGE LABORATORY DATA AND VITAL SIGNS:  Her discharge vitals include temperature 97.5, pulse 70, respiratory rate 21, blood pressure 101/67, O2 sats 93% on 2 liters.  Her discharge labs include white cell count 6.9, hemoglobin 12, hematocrit 39.7, platelets 288.  PT 24.4, INR 2.18.    ______________________________ Elyse Jarvis, MD   ______________________________ Tilford Pillar, MD    MS/MEDQ  D:  05/24/2010  T:  05/24/2010  Job:  [id number]  cc:   Mliss Sax, MD  Electronically Signed by Elyse Jarvis MD on 06/05/2010 06:30:42 PM Electronically Signed by Tilford Pillar  on 06/10/2010 02:05:44 PM

## 2010-06-20 ENCOUNTER — Encounter: Payer: Medicare Other | Admitting: Internal Medicine

## 2010-06-24 ENCOUNTER — Other Ambulatory Visit: Payer: Self-pay | Admitting: Oncology

## 2010-06-24 ENCOUNTER — Encounter: Payer: Medicare Other | Admitting: Oncology

## 2010-06-24 ENCOUNTER — Encounter (HOSPITAL_BASED_OUTPATIENT_CLINIC_OR_DEPARTMENT_OTHER): Payer: Medicare Other | Admitting: Oncology

## 2010-06-24 DIAGNOSIS — I10 Essential (primary) hypertension: Secondary | ICD-10-CM

## 2010-06-24 DIAGNOSIS — I82409 Acute embolism and thrombosis of unspecified deep veins of unspecified lower extremity: Secondary | ICD-10-CM

## 2010-06-24 DIAGNOSIS — E785 Hyperlipidemia, unspecified: Secondary | ICD-10-CM

## 2010-06-24 LAB — CBC WITH DIFFERENTIAL/PLATELET
Basophils Absolute: 0 10*3/uL (ref 0.0–0.1)
HCT: 38.3 % (ref 34.8–46.6)
HGB: 12.5 g/dL (ref 11.6–15.9)
MONO#: 0.4 10*3/uL (ref 0.1–0.9)
NEUT#: 4.2 10*3/uL (ref 1.5–6.5)
NEUT%: 68.2 % (ref 38.4–76.8)
RDW: 18.5 % — ABNORMAL HIGH (ref 11.2–14.5)
WBC: 6.1 10*3/uL (ref 3.9–10.3)
lymph#: 1.2 10*3/uL (ref 0.9–3.3)

## 2010-06-24 LAB — PROTIME-INR: INR: 2.4 (ref 2.00–3.50)

## 2010-06-27 LAB — COMPREHENSIVE METABOLIC PANEL
ALT: 8 U/L (ref 0–35)
AST: 12 U/L (ref 0–37)
Albumin: 4.1 g/dL (ref 3.5–5.2)
BUN: 13 mg/dL (ref 6–23)
CO2: 29 mEq/L (ref 19–32)
Calcium: 9.1 mg/dL (ref 8.4–10.5)
Chloride: 102 mEq/L (ref 96–112)
Creatinine, Ser: 0.78 mg/dL (ref 0.50–1.10)
Potassium: 3.8 mEq/L (ref 3.5–5.3)

## 2010-06-27 LAB — FACTOR 5 LEIDEN

## 2010-06-27 LAB — LUPUS ANTICOAGULANT PANEL
DRVVT 1:1 Mix: 40.3 secs (ref 36.2–44.3)
PTT Lupus Anticoagulant: 41.2 secs (ref 30.0–45.6)

## 2010-06-27 IMAGING — CT CT CHEST W/ CM
2 of 4 series · 15 of 36 positions shown, 18 images · IV contrast (agent unspecified)
Comparison: 05/24/2007

CLINICAL DATA: Shortness of breath, interstitial lung disease,
nodules.

CT CHEST WITH CONTRAST
TECHNIQUE: Multidetector CT imaging of the chest was performed
following the standard protocol during bolus administration of
intravenous contrast.
Contrast: 80 ml [id number]

[Series 2: chest_rout_xxl 5.0 b31f st · axial · 0.86mm/px · z∈[-310,-40]mm · 12 of 64 slices shown, 15 images]
[im 5/64  mediastinal]
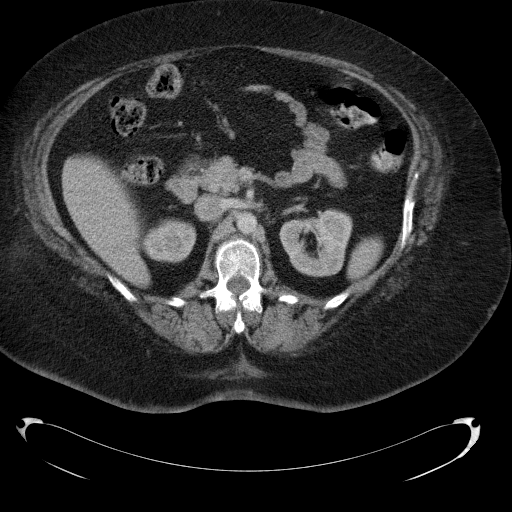
[im 5/64  lung]
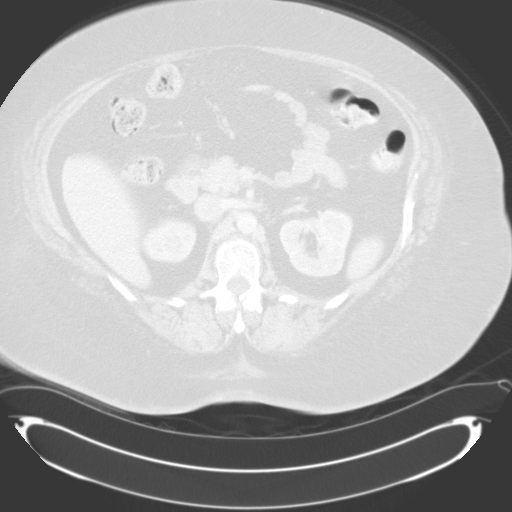
[im 10/64  lung]
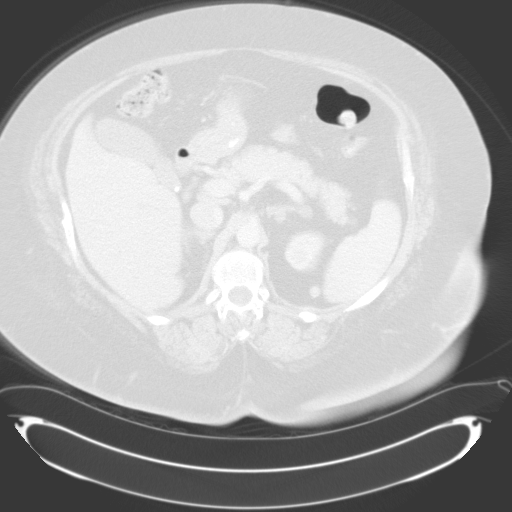
[im 15/64  lung]
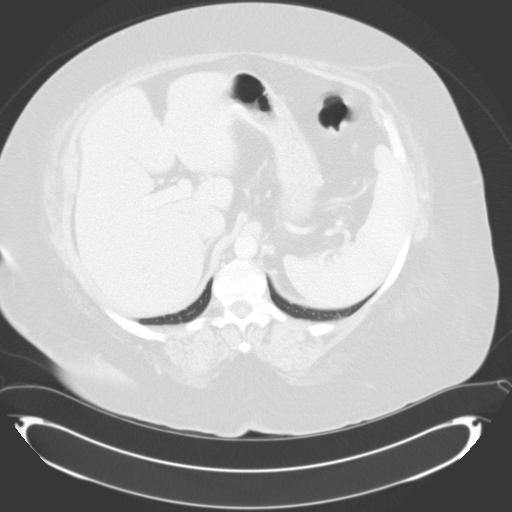
[im 20/64  lung]
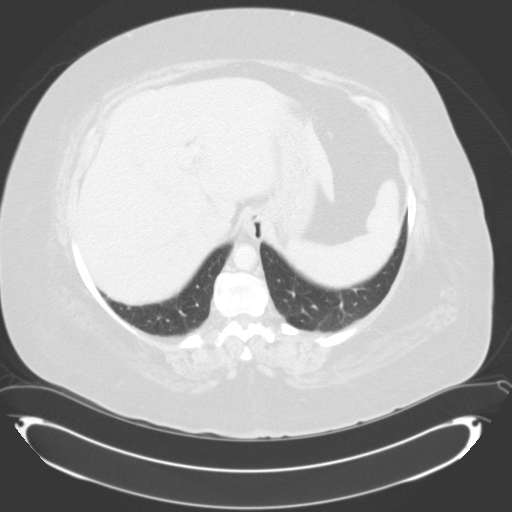
[im 25/64  mediastinal]
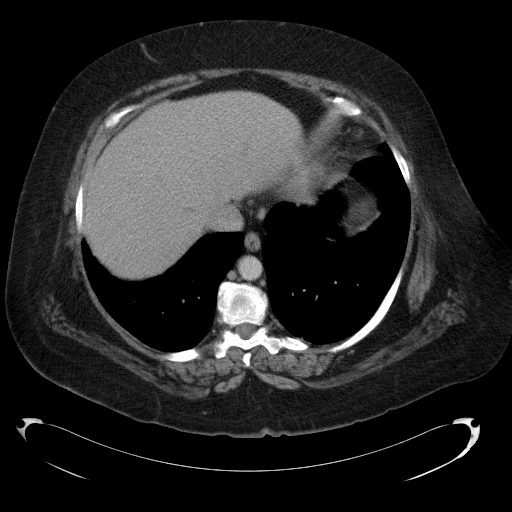
[im 25/64  lung]
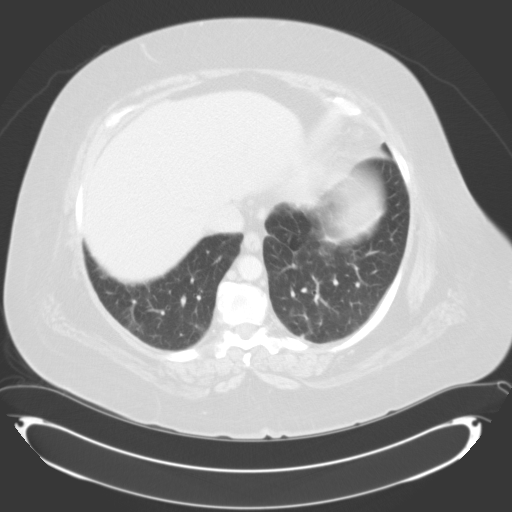
[im 30/64  lung]
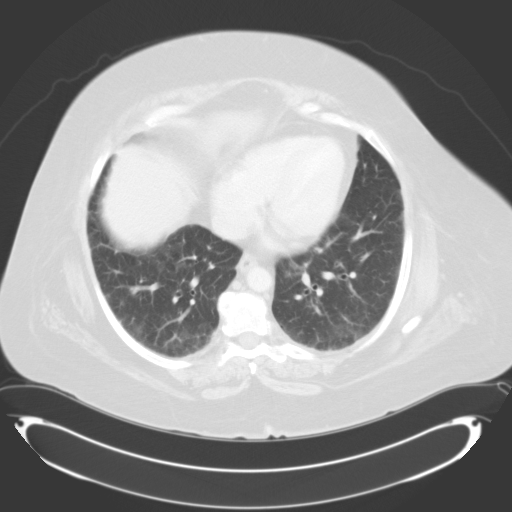
[im 34/64  lung]
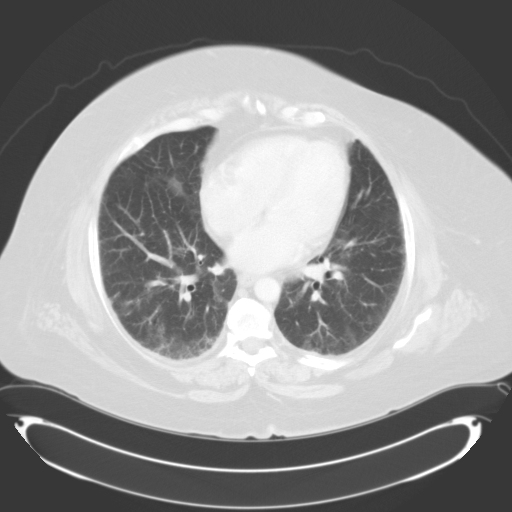
[im 39/64  lung]
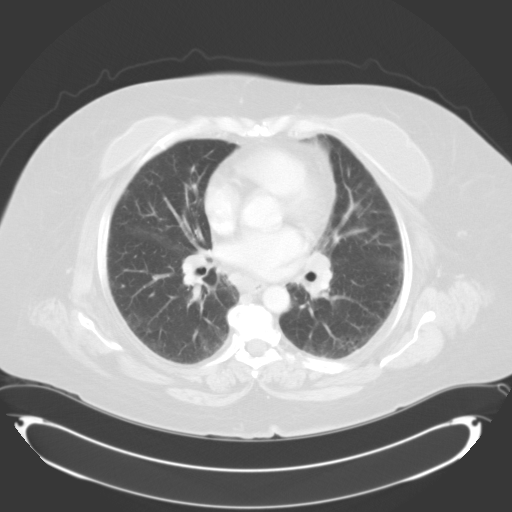
[im 44/64  mediastinal]
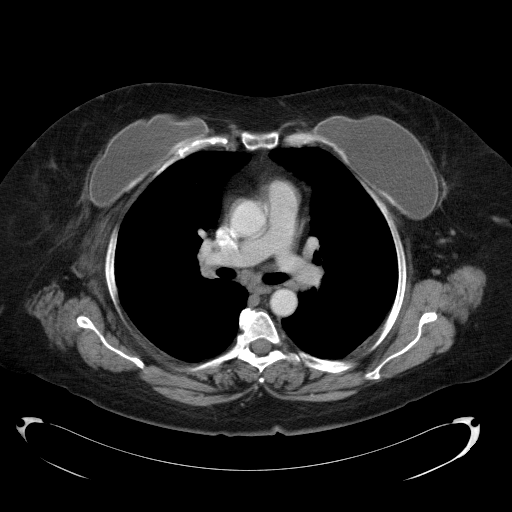
[im 44/64  lung]
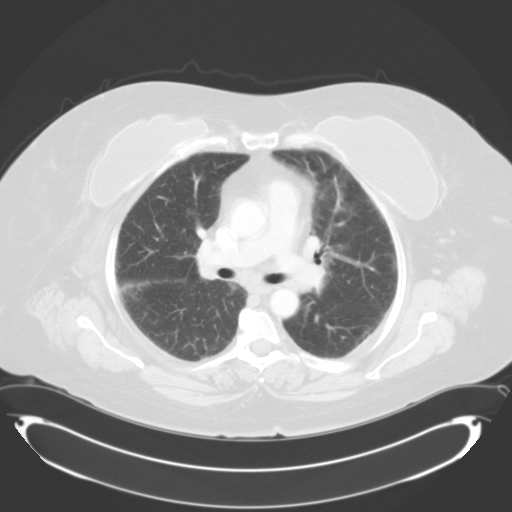
[im 49/64  lung]
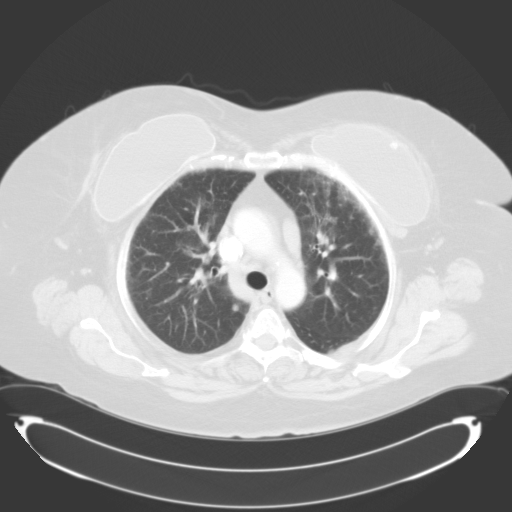
[im 54/64  lung]
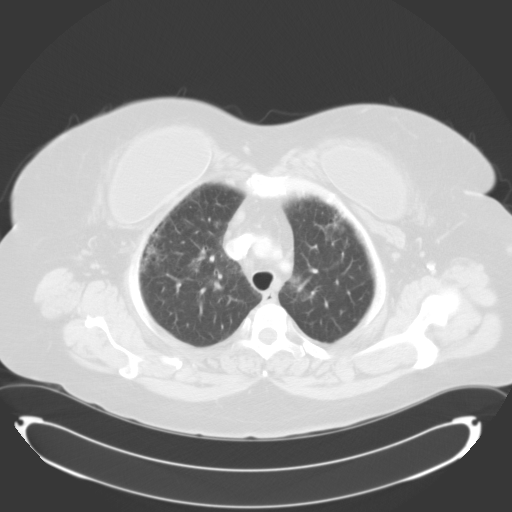
[im 59/64  lung]
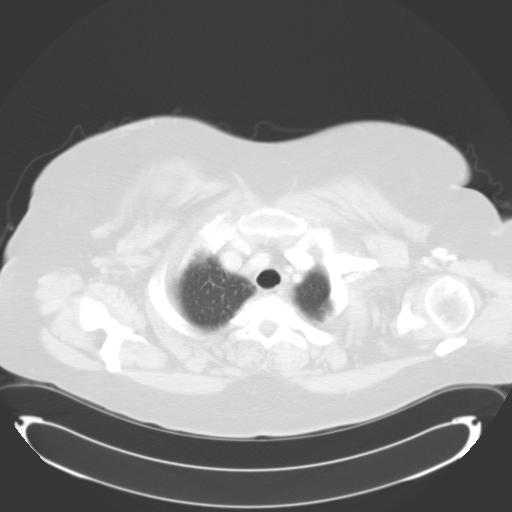

[Series 602: <mpr thick range> · coronal · 0.86mm/px · 3 of 109 slices shown]
[im 22/109  lung]
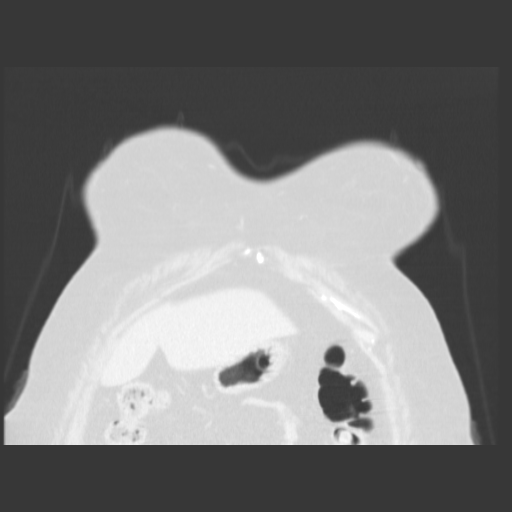
[im 44/109  lung]
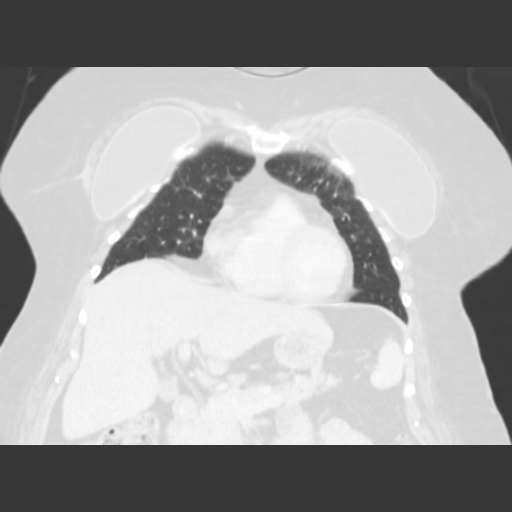
[im 65/109  lung]
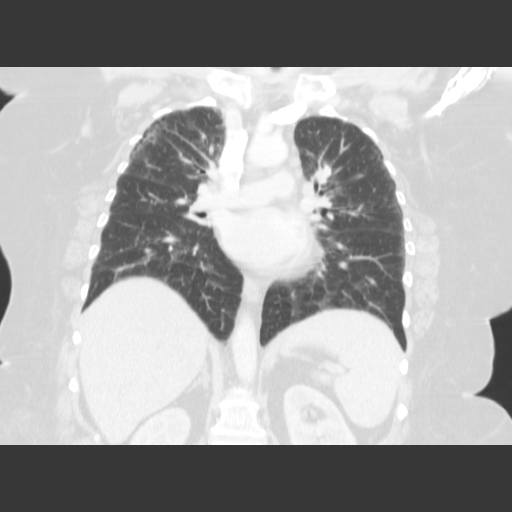

[15 of 36 positions shown; findings below may reference images not displayed]

FINDINGS: Small bilateral upper lobe pulmonary nodules are again
noted.  The largest is in the left upper lobe on image 17, now 6
mm, stable.  Nodule in the posterior medial right upper lobe on
image 16 measures 5-6 mm.  These are stable.

Again noted is interstitial disease with peripheral interstitial
thickening and possible early honeycombing in the upper lobes
predominately.  This is unchanged.  No pleural effusions.

Again seen is mildly enlarged prevascular lymph node, 14 mm in
short axis diameter, stable since prior study.  Prominent
subcarinal adenopathy slightly improved, with short axis diameter
of 11 mm currently.  Small bilateral hilar lymph nodes, not
significantly changed or pathologically enlarged.  No axillary
adenopathy.

Heart is normal size.  Aorta is normal caliber.

Imaging into the upper abdomen shows no acute findings.  There is a
small gallstone within the neck of the gallbladder, stable since
prior study.

Again seen are changes from bilateral mastectomies and implant
reconstruction.  No acute bony abnormality.  Degenerative changes
in the thoracic spine.
IMPRESSION: Stable changes of chronic lung disease and to upper lobe nodules.

Stable prominent mediastinal and hilar nodes.

Gallstone within the neck of the gallbladder, unchanged.

## 2010-07-01 ENCOUNTER — Telehealth: Payer: Self-pay | Admitting: *Deleted

## 2010-07-01 NOTE — Telephone Encounter (Signed)
Apparantly this patient was hospitalized for DVT in May and d/c to a NH.  Just went home.  Discussed with Dr. Alexandria Lodge.  Asked RN to obtain records of dosing, lab monitoring and any recent changes in other meds.  Hold warfarin 2 days and repeat PT/INR on 6-13.  Make her an app't with Dr. Alexandria Lodge. Addendum:  Home Health nurse reports no sign of bleeding or any acute symptom.

## 2010-07-01 NOTE — Telephone Encounter (Signed)
Call from [place], RN with [facility] [phone] She reports pt 73.1  INR 6.1   Pt is on coumadin 4 mg 1 tab each evening. D/c from [facility] last week.  This is the first labs done since then. Pt # [id number]

## 2010-07-01 NOTE — Telephone Encounter (Signed)
Return call to Kim  Pt had DVT in left ankle to thigh.   [facility] a transitional care nursing place, admitted on 5/4 D/c home 6/7   Pt has clinic appointment 6/25 No active signs of bleeding at this time.  Hold coumadin 2 days and recheck INR on Wednesday. Kim RN informed and will inform pt.  Also called [facility] [phone] ) trying to get medical record of meds and labs. Last INR 2.8 of 6/4  She was taking 4 mg coumadin daily She will fax over d/c plan and meds  Appointment with Dr Alexandria Lodge made for 6/11

## 2010-07-03 ENCOUNTER — Telehealth: Payer: Self-pay | Admitting: *Deleted

## 2010-07-03 NOTE — Telephone Encounter (Signed)
Call from [place] with [facility] [phone] PT 30.2   INR 2.5 Pt talking coumadin 4mg   daily  Pt # [phone]  Last INR was 6.1 on 6/11 and coumadin held for 2 days. Pt has appointment Monday with Dr Alexandria Lodge ( first visit with him )  Dr Alexandria Lodge informed

## 2010-07-03 NOTE — Telephone Encounter (Signed)
Instructions per Dr Alexandria Lodge were given to RN Arline Asp with [facility] Coumadin 4 mg once a day and return to coumadin clinic on  Monday for INR with Dr Alexandria Lodge.  If unable to come into clinic INR needs to be done by [facility] and call results.  Cindy voices understanding.

## 2010-07-07 ENCOUNTER — Telehealth: Payer: Self-pay | Admitting: Internal Medicine

## 2010-07-07 NOTE — Telephone Encounter (Signed)
Patient called in expressing her inability to come to the coumadin clinic tomorrow as she feels sick. She asked me to [facility] for [facility] of sample for PT/INR. [facility] refused to do that and I informed the patient about her need to come in the clinic. I also asked to make a regular appointment with any clinic doctor if she is unable to keep her appointment tomorrow.

## 2010-07-08 ENCOUNTER — Ambulatory Visit: Payer: Medicare Other

## 2010-07-09 ENCOUNTER — Telehealth: Payer: Self-pay | Admitting: *Deleted

## 2010-07-09 NOTE — Telephone Encounter (Signed)
Call from [facility] from [facility] about pt's need for an INR to be drawn at home.  Pt missed her appointment and has not rescheduled.  Call to Juleen Starr about the need for the INR.  Per Dr. Alexandria Lodge pt will need to have the PT/INR on Thursday as he will be away on Friday.  Appointment scheduled for pt on  Thursday at 10:00 AM.  Pt was called and informed of.

## 2010-07-11 ENCOUNTER — Ambulatory Visit (INDEPENDENT_AMBULATORY_CARE_PROVIDER_SITE_OTHER): Payer: Medicare Other | Admitting: Pharmacist

## 2010-07-11 DIAGNOSIS — Z86711 Personal history of pulmonary embolism: Secondary | ICD-10-CM

## 2010-07-11 DIAGNOSIS — Z7901 Long term (current) use of anticoagulants: Secondary | ICD-10-CM

## 2010-07-11 LAB — POCT INR: INR: 6.6

## 2010-07-11 NOTE — Progress Notes (Signed)
Anti-Coagulation Progress Note  [name] is a 65 y.o. female who is currently on an anti-coagulation regimen.    RECENT RESULTS: Recent results are below, the most recent result is correlated with a dose of 28 mg. per week: Lab Results  Component Value Date   INR 6.6 07/11/2010   INR 2.40 06/24/2010   INR 2.18* 05/24/2010   PROTIME 28.8* 06/24/2010    ANTI-COAG DOSE:   Latest dosing instructions   Total Sun Mon Tue Wed Thu Fri Sat   4 2 mg      2 mg    (4 mg0.5)      (4 mg0.5)         ANTICOAG SUMMARY: Anticoagulation Episode Summary              Current INR goal 2.0-3.0 Next INR check 07/15/2010   INR from last check 6.6! (07/11/2010)     Weekly max dose (mg)  Target end date Indefinite   Indications    INR check location Home Draw Preferred lab    Send INR reminders to    Comments             ANTICOAG TODAY: Anticoagulation Summary as of 07/11/2010              INR goal 2.0-3.0     Selected INR 6.6! (07/11/2010) Next INR check 07/15/2010   Weekly max dose (mg)  Target end date Indefinite   Indications     Anticoagulation Episode Summary              INR check location Home Draw Preferred lab    Send INR reminders to    Comments             PATIENT INSTRUCTIONS: Patient Instructions  Patient instructed to take medications as defined in the Anti-coagulation Track section of this encounter.  Patient instructed to OMIT today's dose and OMIT TOMORROW's dose. Recommence on Saturday by taking 1/2 x 4mg  (2mg ) warfarin by mouth daily until next INR is collected by [facility] RN on Monday of next week. Patient verbalized understanding of these instructions.        FOLLOW-UP Return in 4 days (on 07/15/2010) for Follow up INR.  Hulen Luster, III Pharm.D., CACP

## 2010-07-11 NOTE — Patient Instructions (Signed)
Patient instructed to take medications as defined in the Anti-coagulation Track section of this encounter.  Patient instructed to OMIT today's dose and OMIT TOMORROW's dose. Recommence on Saturday by taking 1/2 x 4mg  (2mg ) warfarin by mouth daily until next INR is collected by [facility] RN on Monday of next week. Patient verbalized understanding of these instructions.

## 2010-07-15 ENCOUNTER — Encounter (HOSPITAL_BASED_OUTPATIENT_CLINIC_OR_DEPARTMENT_OTHER): Payer: Medicare Other | Admitting: Oncology

## 2010-07-15 ENCOUNTER — Ambulatory Visit: Payer: Medicare Other

## 2010-07-15 ENCOUNTER — Other Ambulatory Visit: Payer: Self-pay | Admitting: Oncology

## 2010-07-15 DIAGNOSIS — Z86718 Personal history of other venous thrombosis and embolism: Secondary | ICD-10-CM

## 2010-07-15 DIAGNOSIS — Z7901 Long term (current) use of anticoagulants: Secondary | ICD-10-CM

## 2010-07-15 DIAGNOSIS — I82409 Acute embolism and thrombosis of unspecified deep veins of unspecified lower extremity: Secondary | ICD-10-CM

## 2010-07-15 LAB — PROTIME-INR
INR: 2.1 (ref 2.00–3.50)
Protime: 25.2 Seconds — ABNORMAL HIGH (ref 10.6–13.4)

## 2010-07-25 ENCOUNTER — Other Ambulatory Visit: Payer: Self-pay | Admitting: Internal Medicine

## 2010-07-26 ENCOUNTER — Encounter: Payer: Self-pay | Admitting: Internal Medicine

## 2010-07-26 ENCOUNTER — Ambulatory Visit (INDEPENDENT_AMBULATORY_CARE_PROVIDER_SITE_OTHER): Payer: Medicare Other | Admitting: Internal Medicine

## 2010-07-26 DIAGNOSIS — I10 Essential (primary) hypertension: Secondary | ICD-10-CM

## 2010-07-26 DIAGNOSIS — F17200 Nicotine dependence, unspecified, uncomplicated: Secondary | ICD-10-CM

## 2010-07-26 DIAGNOSIS — E785 Hyperlipidemia, unspecified: Secondary | ICD-10-CM

## 2010-07-26 NOTE — Patient Instructions (Signed)
Please schedule appointment in 3 month

## 2010-07-26 NOTE — Assessment & Plan Note (Signed)
I encouraged staying off tobacco. She is reporting not smoking over 8 weeks.

## 2010-07-26 NOTE — Assessment & Plan Note (Signed)
Well-controlled on current medication regimen. Electrolytes were recently checked and were within normal limits. We'll continue the same medication regimen for now.

## 2010-07-26 NOTE — Assessment & Plan Note (Signed)
LDL is at goal. Last time this was checked was approximately 1 year ago patient requested to have a lipid panel checked on the next visit rather than today since she has been eating prior to coming here. We can check fasting lipid panel on her next visit and we'll readjust medication regimen as indicated.

## 2010-07-26 NOTE — Progress Notes (Signed)
  Subjective:    Patient ID: [name], female    DOB: 03/07/1945, 65 y.o.   MRN: [id number]  HPI Patient is a 65 year old female with multiple medical problems and recent hospitalization for left lower extremity cellulitis and DVT. He comes in today for regular followup on her blood pressure and left lower extremity cellulitis. She reports feeling well, reports quitting smoking 8 weeks ago and has been compliant with recommended exercising and diet. She also reports compliance with her medications. She has stayed at rehabilitation for 6 weeks but would like to go back for additional rehabilitation since she still reports difficulty with ambulation. She would like [place] to fill out the paperwork for her so she can go back to rehabilitation. She denies any recent episodes of chest pain, abdominal or urinary concerns, but she does report difficulty ambulation and tells me that she is mostly wheelchair-bound. She requires assistance at home with some of the activities of daily living including dressing and bathing including ambulating. She continues herself but requires help with cooking. She reports having a nurse aid at home which helps but things that she would be better off at the rehabilitation. She reports her left lower extremity is much better with no swelling and no redness. She also denies pain in her lower extremities.   Review of Systems Per History of present illness    Objective:   Physical Exam Constitutional: Vital signs reviewed.  Patient is a well-developed and well-nourished in no acute distress and cooperative with exam. Alert and oriented x3.  Neck: Supple, Trachea midline normal ROM, No JVD, mass, thyromegaly, or carotid bruit present.  Cardiovascular: RRR, S1 normal, S2 normal, no MRG, pulses symmetric and intact bilaterally Pulmonary/Chest: CTAB, no wheezes, rales, or rhonchi Abdominal: Soft. Non-tender, non-distended, bowel sounds are normal, no masses, organomegaly, or  guarding present.  Musculoskeletal: No joint deformities, erythema, or stiffness, ROM full and no nontender Neurological: A&O x3, Strenght is normal and symmetric bilaterally, cranial nerve II-XII are grossly intact, no focal motor deficit, sensory intact to light touch bilaterally.  Skin: Warm, dry and intact. No rash, cyanosis, or clubbing.  Psychiatric: Normal mood and affect. speech and behavior is normal. Judgment and thought content normal. Cognition and memory are normal.          Assessment & Plan:

## 2010-07-29 ENCOUNTER — Telehealth: Payer: Self-pay | Admitting: *Deleted

## 2010-07-29 NOTE — Telephone Encounter (Signed)
[name], I am not sure what is she talking about, I filled out the paperwork and gave her the papers, all of it so we don't have anything here in clinic. AM I missing something? Thank you Sherlon Handing

## 2010-07-29 NOTE — Telephone Encounter (Signed)
Pt calls and leaves message that she needs to get her paperwork that she left back and to the right people??? She can be called at [phone]

## 2010-08-21 ENCOUNTER — Other Ambulatory Visit: Payer: Self-pay | Admitting: Internal Medicine

## 2010-09-15 ENCOUNTER — Other Ambulatory Visit: Payer: Self-pay | Admitting: Internal Medicine

## 2010-09-16 ENCOUNTER — Ambulatory Visit: Payer: Medicare Other | Admitting: Internal Medicine

## 2010-09-16 ENCOUNTER — Telehealth: Payer: Self-pay | Admitting: *Deleted

## 2010-09-16 MED ORDER — LAMOTRIGINE 100 MG PO TABS: 100.0000 mg | ORAL_TABLET | Freq: Every day | ORAL | Status: DC

## 2010-09-16 MED ORDER — OMEPRAZOLE 20 MG PO CPDR: 20.0000 mg | DELAYED_RELEASE_CAPSULE | Freq: Every day | ORAL | Status: DC

## 2010-09-16 NOTE — Telephone Encounter (Signed)
Checking chart

## 2010-10-11 LAB — CBC
HCT: 40.4
Hemoglobin: 13.3
MCHC: 32.8
MCV: 89.4
RBC: 4.52

## 2010-10-11 LAB — BASIC METABOLIC PANEL
BUN: 11
Chloride: 106
Glucose, Bld: 107 — ABNORMAL HIGH
Potassium: 4.7

## 2010-10-21 LAB — RAPID URINE DRUG SCREEN, HOSP PERFORMED
Benzodiazepines: POSITIVE — AB
Cocaine: NOT DETECTED
Tetrahydrocannabinol: NOT DETECTED

## 2010-10-21 LAB — COMPREHENSIVE METABOLIC PANEL
AST: 18
AST: 35
Albumin: 3.4 — ABNORMAL LOW
Alkaline Phosphatase: 165 — ABNORMAL HIGH
CO2: 32
Calcium: 8.2 — ABNORMAL LOW
Chloride: 100
Creatinine, Ser: 0.65
GFR calc Af Amer: >60
GFR calc Af Amer: >60
GFR calc non Af Amer: >60
Potassium: 3.9
Sodium: 141
Total Bilirubin: 0.8
Total Protein: 6
Total Protein: 6.1

## 2010-10-21 LAB — CBC
HCT: 31.1 — ABNORMAL LOW
HCT: 31.1 — ABNORMAL LOW
HCT: 36.7
Hemoglobin: 10.3 — ABNORMAL LOW
MCHC: 31.6
MCHC: 32
MCHC: 32.7
MCHC: 32.8
MCHC: 33.4
MCV: 83.3
MCV: 84.9
MCV: 85.1
MCV: 85.8
Platelets: 275
Platelets: 288
Platelets: 296
Platelets: 337
Platelets: 363
RBC: 3.68 — ABNORMAL LOW
RBC: 3.74 — ABNORMAL LOW
RBC: 4.08
RBC: 4.19
RDW: 18 — ABNORMAL HIGH
RDW: 18.2 — ABNORMAL HIGH
RDW: 18.5 — ABNORMAL HIGH
WBC: 6.5
WBC: 7.5

## 2010-10-21 LAB — DIFFERENTIAL
Basophils Absolute: 0
Basophils Relative: 0
Basophils Relative: 1
Eosinophils Absolute: 0.2
Eosinophils Relative: 2
Eosinophils Relative: 3
Lymphocytes Relative: 22
Lymphocytes Relative: 26
Lymphs Abs: 1.7
Neutro Abs: 5.9
Neutrophils Relative %: 64
Neutrophils Relative %: 79 — ABNORMAL HIGH

## 2010-10-21 LAB — MAGNESIUM
Magnesium: 2.1
Magnesium: 2.3

## 2010-10-21 LAB — POCT I-STAT 3, ART BLOOD GAS (G3+)
Acid-Base Excess: 10 — ABNORMAL HIGH
Acid-Base Excess: 10 — ABNORMAL HIGH
Acid-Base Excess: 13 — ABNORMAL HIGH
O2 Saturation: 100
O2 Saturation: 79
Patient temperature: 37
Patient temperature: 37
Patient temperature: 98.9
TCO2: 41
pCO2 arterial: 58.8
pCO2 arterial: 60.2
pH, Arterial: 7.428 — ABNORMAL HIGH
pH, Arterial: 7.45 — ABNORMAL HIGH

## 2010-10-21 LAB — URINE CULTURE

## 2010-10-21 LAB — BASIC METABOLIC PANEL
BUN: 6
BUN: 6
BUN: 8
BUN: 9
CO2: 29
CO2: 30
CO2: 33 — ABNORMAL HIGH
CO2: 34 — ABNORMAL HIGH
CO2: 34 — ABNORMAL HIGH
CO2: 35 — ABNORMAL HIGH
Calcium: 8.4
Calcium: 8.5
Calcium: 8.6
Chloride: 100
Chloride: 102
Chloride: 106
Chloride: 107
Chloride: 111
Chloride: 97
Chloride: 99
Creatinine, Ser: 0.65
Creatinine, Ser: 0.65
Creatinine, Ser: 0.67
Creatinine, Ser: 0.72
Creatinine, Ser: 0.77
Creatinine, Ser: 1.01
GFR calc Af Amer: >60
GFR calc Af Amer: >60
GFR calc Af Amer: >60
GFR calc non Af Amer: >60
Potassium: 4.1
Sodium: 141
Sodium: 143

## 2010-10-21 LAB — URINALYSIS, ROUTINE W REFLEX MICROSCOPIC
Bilirubin Urine: NEGATIVE
Glucose, UA: NEGATIVE
Ketones, ur: NEGATIVE
pH: 5.5

## 2010-10-21 LAB — GAMMA GT: GGT: 17

## 2010-10-21 LAB — CLOSTRIDIUM DIFFICILE EIA: C difficile Toxins A+B, EIA: NEGATIVE

## 2010-10-21 LAB — B-NATRIURETIC PEPTIDE (CONVERTED LAB)
Pro B Natriuretic peptide (BNP): 109 — ABNORMAL HIGH
Pro B Natriuretic peptide (BNP): 51
Pro B Natriuretic peptide (BNP): <30

## 2010-10-21 LAB — TROPONIN I
Troponin I: 0.01
Troponin I: <0.01

## 2010-10-21 LAB — POCT I-STAT, CHEM 8
BUN: 10
HCT: 38
Hemoglobin: 12.9
Sodium: 141
TCO2: 35

## 2010-10-21 LAB — URINE MICROSCOPIC-ADD ON

## 2010-10-21 LAB — CULTURE, BLOOD (ROUTINE X 2)
Culture: NO GROWTH
Culture: NO GROWTH

## 2010-10-21 LAB — HEPARIN LEVEL (UNFRACTIONATED)
Heparin Unfractionated: 0.19 — ABNORMAL LOW
Heparin Unfractionated: 0.35
Heparin Unfractionated: 0.89 — ABNORMAL HIGH
Heparin Unfractionated: 0.97 — ABNORMAL HIGH

## 2010-10-21 LAB — IRON AND TIBC: TIBC: 307

## 2010-10-21 LAB — POCT CARDIAC MARKERS
CKMB, poc: 2.7
CKMB, poc: 3
Myoglobin, poc: 119
Troponin i, poc: <0.05

## 2010-10-21 LAB — FERRITIN: Ferritin: 13 (ref 10–291)

## 2010-10-21 LAB — CK TOTAL AND CKMB (NOT AT ARMC)
CK, MB: 2.8
CK, MB: 3.6
Relative Index: 1
Relative Index: 1.5
Total CK: 279 — ABNORMAL HIGH
Total CK: 385 — ABNORMAL HIGH

## 2010-10-21 LAB — FOLATE: Folate: 10.1

## 2010-10-21 LAB — PROTIME-INR
INR: 1.3
INR: 2.4 — ABNORMAL HIGH
INR: 3.9 — ABNORMAL HIGH
Prothrombin Time: 41.4 — ABNORMAL HIGH

## 2010-10-21 LAB — MPO/PR-3 (ANCA) ANTIBODIES

## 2010-10-21 LAB — ANTI-SCLERODERMA ANTIBODY: Scleroderma (Scl-70) (ENA) Antibody, IgG: <0.2 AI (ref <1.0)

## 2010-10-21 LAB — RHEUMATOID FACTOR: Rhuematoid fact SerPl-aCnc: <20

## 2010-10-21 LAB — D-DIMER, QUANTITATIVE: D-Dimer, Quant: 0.35

## 2010-11-01 LAB — COMPREHENSIVE METABOLIC PANEL
ALT: 12
Alkaline Phosphatase: 131 — ABNORMAL HIGH
BUN: 8
CO2: 31
GFR calc non Af Amer: >60
Glucose, Bld: 112 — ABNORMAL HIGH
Potassium: 3.7
Sodium: 142
Total Bilirubin: 0.4
Total Protein: 6.2

## 2010-11-01 LAB — CBC
HCT: 35.6 — ABNORMAL LOW
Hemoglobin: 11.9 — ABNORMAL LOW
MCHC: 33.5
RBC: 3.9
RDW: 15.4 — ABNORMAL HIGH

## 2010-11-01 LAB — D-DIMER, QUANTITATIVE: D-Dimer, Quant: <0.22

## 2010-11-01 LAB — DIFFERENTIAL
Basophils Absolute: 0
Basophils Relative: 0
Eosinophils Absolute: 0.3
Monocytes Relative: 8
Neutro Abs: 4.7
Neutrophils Relative %: 60

## 2010-11-01 LAB — POCT CARDIAC MARKERS
Myoglobin, poc: 41.1
Operator id: 277751

## 2010-11-01 LAB — B-NATRIURETIC PEPTIDE (CONVERTED LAB): Pro B Natriuretic peptide (BNP): 64

## 2010-11-04 ENCOUNTER — Other Ambulatory Visit: Payer: Self-pay | Admitting: Oncology

## 2010-11-04 ENCOUNTER — Encounter (HOSPITAL_BASED_OUTPATIENT_CLINIC_OR_DEPARTMENT_OTHER): Payer: Medicare Other | Admitting: Oncology

## 2010-11-04 DIAGNOSIS — Z86718 Personal history of other venous thrombosis and embolism: Secondary | ICD-10-CM

## 2010-11-04 DIAGNOSIS — Z7901 Long term (current) use of anticoagulants: Secondary | ICD-10-CM

## 2010-11-04 DIAGNOSIS — I82409 Acute embolism and thrombosis of unspecified deep veins of unspecified lower extremity: Secondary | ICD-10-CM

## 2010-11-04 LAB — PROTIME-INR: Protime: 19.2 Seconds — ABNORMAL HIGH (ref 10.6–13.4)

## 2010-11-13 ENCOUNTER — Other Ambulatory Visit: Payer: Self-pay | Admitting: Oncology

## 2010-11-13 ENCOUNTER — Encounter (HOSPITAL_BASED_OUTPATIENT_CLINIC_OR_DEPARTMENT_OTHER): Payer: Medicare Other | Admitting: Oncology

## 2010-11-13 DIAGNOSIS — Z86718 Personal history of other venous thrombosis and embolism: Secondary | ICD-10-CM

## 2010-11-13 DIAGNOSIS — Z7901 Long term (current) use of anticoagulants: Secondary | ICD-10-CM

## 2010-11-13 DIAGNOSIS — I82409 Acute embolism and thrombosis of unspecified deep veins of unspecified lower extremity: Secondary | ICD-10-CM

## 2010-11-13 LAB — PROTIME-INR
INR: 1.8 — ABNORMAL LOW (ref 2.00–3.50)
Protime: 21.6 Seconds — ABNORMAL HIGH (ref 10.6–13.4)

## 2010-11-13 LAB — CBC WITH DIFFERENTIAL/PLATELET
Basophils Absolute: 0 10*3/uL (ref 0.0–0.1)
Eosinophils Absolute: 0.2 10*3/uL (ref 0.0–0.5)
HGB: 12.8 g/dL (ref 11.6–15.9)
MCV: 87.8 fL (ref 79.5–101.0)
MONO%: 6 % (ref 0.0–14.0)
NEUT#: 5.2 10*3/uL (ref 1.5–6.5)
RBC: 4.67 10*6/uL (ref 3.70–5.45)
RDW: 17.4 % — ABNORMAL HIGH (ref 11.2–14.5)
WBC: 7.2 10*3/uL (ref 3.9–10.3)
lymph#: 1.4 10*3/uL (ref 0.9–3.3)
nRBC: 0 % (ref 0–0)

## 2010-11-26 ENCOUNTER — Other Ambulatory Visit (HOSPITAL_BASED_OUTPATIENT_CLINIC_OR_DEPARTMENT_OTHER): Payer: Medicare Other | Admitting: Lab

## 2010-11-26 ENCOUNTER — Other Ambulatory Visit: Payer: Self-pay | Admitting: Oncology

## 2010-11-26 ENCOUNTER — Ambulatory Visit: Payer: Medicare Other

## 2010-11-26 DIAGNOSIS — I824Y9 Acute embolism and thrombosis of unspecified deep veins of unspecified proximal lower extremity: Secondary | ICD-10-CM

## 2010-11-26 DIAGNOSIS — I2699 Other pulmonary embolism without acute cor pulmonale: Secondary | ICD-10-CM

## 2010-11-26 DIAGNOSIS — Z7901 Long term (current) use of anticoagulants: Secondary | ICD-10-CM

## 2010-11-26 DIAGNOSIS — I82409 Acute embolism and thrombosis of unspecified deep veins of unspecified lower extremity: Secondary | ICD-10-CM

## 2010-11-26 DIAGNOSIS — Z86718 Personal history of other venous thrombosis and embolism: Secondary | ICD-10-CM

## 2010-11-26 DIAGNOSIS — I82402 Acute embolism and thrombosis of unspecified deep veins of left lower extremity: Secondary | ICD-10-CM | POA: Insufficient documentation

## 2010-11-26 HISTORY — DX: Acute embolism and thrombosis of unspecified deep veins of unspecified lower extremity: I82.409

## 2010-11-26 LAB — PROTIME-INR
INR: 2.1 (ref 2.00–3.50)
Protime: 25.2 Seconds — ABNORMAL HIGH (ref 10.6–13.4)

## 2010-11-26 LAB — POCT INR: INR: 2.1

## 2010-12-10 ENCOUNTER — Ambulatory Visit: Payer: Medicare Other

## 2010-12-10 ENCOUNTER — Other Ambulatory Visit: Payer: Self-pay | Admitting: Oncology

## 2010-12-10 ENCOUNTER — Other Ambulatory Visit: Payer: Self-pay | Admitting: Pharmacist

## 2010-12-10 ENCOUNTER — Other Ambulatory Visit (HOSPITAL_BASED_OUTPATIENT_CLINIC_OR_DEPARTMENT_OTHER): Payer: Medicare Other | Admitting: Lab

## 2010-12-10 DIAGNOSIS — I824Y9 Acute embolism and thrombosis of unspecified deep veins of unspecified proximal lower extremity: Secondary | ICD-10-CM

## 2010-12-10 DIAGNOSIS — I82409 Acute embolism and thrombosis of unspecified deep veins of unspecified lower extremity: Secondary | ICD-10-CM

## 2010-12-10 DIAGNOSIS — I2699 Other pulmonary embolism without acute cor pulmonale: Secondary | ICD-10-CM

## 2010-12-10 LAB — POCT INR: INR: 1.9

## 2010-12-10 NOTE — Progress Notes (Unsigned)
Pt stated that she has some pain around her eyes and some redness.  Thought may be associated with Coumadin.  INR has not been supratherapeutic, so I informed pt that I did not think related to coumadin.  Instructed pt to call if does not improve or if gets worse.

## 2010-12-20 ENCOUNTER — Encounter: Payer: Self-pay | Admitting: *Deleted

## 2011-01-01 ENCOUNTER — Other Ambulatory Visit: Payer: Self-pay | Admitting: Oncology

## 2011-01-01 ENCOUNTER — Ambulatory Visit (HOSPITAL_BASED_OUTPATIENT_CLINIC_OR_DEPARTMENT_OTHER): Payer: Medicare Other | Admitting: Oncology

## 2011-01-01 ENCOUNTER — Ambulatory Visit: Payer: Medicare Other

## 2011-01-01 ENCOUNTER — Other Ambulatory Visit (HOSPITAL_BASED_OUTPATIENT_CLINIC_OR_DEPARTMENT_OTHER): Payer: Medicare Other | Admitting: Lab

## 2011-01-01 ENCOUNTER — Ambulatory Visit: Payer: Self-pay | Admitting: Oncology

## 2011-01-01 VITALS — BP 143/88 | HR 72 | Temp 97.4°F

## 2011-01-01 DIAGNOSIS — I824Y9 Acute embolism and thrombosis of unspecified deep veins of unspecified proximal lower extremity: Secondary | ICD-10-CM

## 2011-01-01 DIAGNOSIS — Z7901 Long term (current) use of anticoagulants: Secondary | ICD-10-CM

## 2011-01-01 DIAGNOSIS — I82409 Acute embolism and thrombosis of unspecified deep veins of unspecified lower extremity: Secondary | ICD-10-CM

## 2011-01-01 DIAGNOSIS — R0602 Shortness of breath: Secondary | ICD-10-CM

## 2011-01-01 DIAGNOSIS — I2699 Other pulmonary embolism without acute cor pulmonale: Secondary | ICD-10-CM

## 2011-01-01 LAB — CBC WITH DIFFERENTIAL/PLATELET
BASO%: 0.7 % (ref 0.0–2.0)
Eosinophils Absolute: 0.2 10*3/uL (ref 0.0–0.5)
HCT: 43.1 % (ref 34.8–46.6)
MCHC: 31.8 g/dL (ref 31.5–36.0)
MONO#: 0.4 10*3/uL (ref 0.1–0.9)
NEUT#: 7.4 10*3/uL — ABNORMAL HIGH (ref 1.5–6.5)
RBC: 4.91 10*6/uL (ref 3.70–5.45)
WBC: 9.4 10*3/uL (ref 3.9–10.3)
lymph#: 1.3 10*3/uL (ref 0.9–3.3)

## 2011-01-01 LAB — COMPREHENSIVE METABOLIC PANEL
ALT: 8 U/L (ref 0–35)
Albumin: 3.7 g/dL (ref 3.5–5.2)
CO2: 33 mEq/L — ABNORMAL HIGH (ref 19–32)
Calcium: 8.6 mg/dL (ref 8.4–10.5)
Chloride: 103 mEq/L (ref 96–112)
Potassium: 3.9 mEq/L (ref 3.5–5.3)
Sodium: 141 mEq/L (ref 135–145)
Total Protein: 6.2 g/dL (ref 6.0–8.3)

## 2011-01-01 LAB — PROTIME-INR: Protime: 30 Seconds — ABNORMAL HIGH (ref 10.6–13.4)

## 2011-01-01 LAB — POCT INR: INR: 2.5

## 2011-01-01 NOTE — Progress Notes (Signed)
[facility] OFFICE PROGRESS NOTE  RAISCH, MICHAEL, MD, MD  DIAGNOSIS:  Extensive LLE DVT, history of PE.  CURRENT THERAPY:  Lifelong anticoagulation with goal INR 2-3.  INTERVAL HISTORY: [name] 65 y.o. female returns for regular follow up.  She has morbid obesity with OA and is not very mobile.  She has been taking Coumadin with complete resolution of pain in her left calf.  She still has chronic venous stasis swelling that is worst in the left inner calf/thigh but without pain, erythema.  She has not had bleeding complication except from a wart in her left medial buttock.  She has SOB and DOE from deconditioning but no chest pain.    Patient denies fatigue, headache, visual changes, confusion, drenching night sweats, palpable lymph node swelling, mucositis, odynophagia, dysphagia, nausea vomiting, jaundice, productive cough, gum bleeding, epistaxis, hematemesis, hemoptysis, abdominal pain, abdominal swelling, early satiety, melena, hematochezia, hematuria, skin rash, spontaneous bleeding, joint swelling, joint pain, heat or cold intolerance, bowel bladder incontinence, back pain, focal motor weakness, paresthesia, depression, suicidal or homocidal ideation, feeling hopelessness.   MEDICAL HISTORY: Past Medical History  Diagnosis Date  . COPD (chronic obstructive pulmonary disease)   . Depression     followed by Dr. Gwyndolyn Kaufman, [facility]  . GERD (gastroesophageal reflux disease)   . Hypertension   . Hyperlipidemia   . Basal cell carcinoma 09/2001    followed by Dr. Karlyn Agee  . Interstitial cystitis     followed by Dr. Isabel Caprice  . Irritable bowel syndrome     colonoscopy 12/2003 and EGD 1997 WNL, followed by Dr. Marina Goodell  . Spinal stenosis     L3-L4, L4-L5 per MRI 12/2004, status post lumbar decompression 02/2007 by Dr. Gerlene Fee  . Pulmonary embolism 10/2007    on coumadin and followed by Dr. Alexandria Lodge  . OSA on CPAP     noncompliance  . S/P mastectomy 1993    bilateral.  status post reconstruction in 1994 by Dr. Benna Dunks  . S/P rhinoplasty 1993  . History of back surgery 02/2007    SURGICAL HISTORY:  Past Surgical History  Procedure Date  . Cervical discectomy 11/1995 and 05/2002    by Dr. Gerlene Fee  . Abdominal hysterectomy 1976  . Bladder suspension 1993  . Orif ankle fracture 1993  . Lumbar laminectomy/decompression microdiscectomy 02/2007    by Dr. Gerlene Fee    MEDICATIONS: Current Outpatient Prescriptions  Medication Sig Dispense Refill  . ferrous sulfate 325 (65 FE) MG EC tablet take 1 tablet by mouth three times a day  90 tablet  0  . furosemide (LASIX) 40 MG tablet take 1 tablet by mouth once daily  30 tablet  5  . gabapentin (NEURONTIN) 100 MG capsule Take 100 mg by mouth 3 (three) times daily.        Marland Kitchen HYDROcodone-acetaminophen (NORCO) 5-325 MG per tablet Take 1 tablet by mouth every 6 (six) hours as needed.        . lamoTRIgine (LAMICTAL) 100 MG tablet Take 1 tablet (100 mg total) by mouth daily.  90 tablet  2  . LORazepam (ATIVAN) 1 MG tablet Take one tablet in morning and two tablets by mouth at bedtime       . omeprazole (PRILOSEC) 20 MG capsule Take 1 capsule (20 mg total) by mouth daily.  90 capsule  11  . pravastatin (PRAVACHOL) 40 MG tablet take 1 tablet by mouth at bedtime  30 tablet  5  . QUEtiapine (SEROQUEL) 200 MG tablet  Take 400 mg by mouth at bedtime.        . sertraline (ZOLOFT) 100 MG tablet Take 200 mg by mouth daily.        Marland Kitchen warfarin (COUMADIN) 4 MG tablet Take 4 mg by mouth daily. As directed by coumadin clinic          ALLERGIES:  is allergic to chlorpromazine hcl; codeine; morphine; and sulfonamide derivatives.  REVIEW OF SYSTEMS:  The rest of the 14-point review of system was negative.   Filed Vitals:   01/01/11 1402  BP: 143/88  Pulse: 72  Temp: 97.4 F (36.3 C)   Wt Readings from Last 3 Encounters:  06/24/10 332 lb 12.8 oz (150.957 kg)  01/25/10 321 lb 0.3 oz (145.613 kg)  09/04/09 330 lb 11.2 oz (150.005  kg)   ECOG Performance status: 2 due to morbid obesity.   PHYSICAL EXAMINATION:   General:  Morbidly obese woman in no acute distress.  Eyes:  no scleral icterus.  ENT:  There were no oropharyngeal lesions.  Neck was without thyromegaly.  Lymphatics:  Negative cervical, supraclavicular or axillary adenopathy.  Respiratory: lungs were clear bilaterally without wheezing or crackles.  Cardiovascular:  Regular rate and rhythm, S1/S2, without murmur, rub or gallop.  There was no pedal edema.  GI:  abdomen was obese but nontender, nondistended, without organomegaly.  Muscoloskeletal:  no spinal tenderness of palpation of vertebral spine.  Skin exam was without echymosis, petichae.  She has a 3x2cm left medial thigh wart without discoloration, pain on palpation or bleeding.  Neuro exam was nonfocal.  Patient could not get on the exam table.  She was wheelchair bound; gait could not be assessed.    Patient was alerted and oriented.  Attention was good.   Language was appropriate.  Mood was normal without depression.  Speech was not pressured.  Thought content was not tangential.     LABORATORY/RADIOLOGY DATA:  Lab Results  Component Value Date   WBC 9.4 01/01/2011   HGB 13.7 01/01/2011   HCT 43.1 01/01/2011   PLT 317 01/01/2011   GLUCOSE 95 01/01/2011   GLUCOSE 95 01/01/2011   CHOL 170 05/03/2009   TRIG 166* 05/03/2009   HDL 41 05/03/2009   LDLCALC 96 05/03/2009   ALT 8 01/01/2011   ALT 8 01/01/2011   AST 11 01/01/2011   AST 11 01/01/2011   NA 141 01/01/2011   NA 141 01/01/2011   K 3.9 01/01/2011   K 3.9 01/01/2011   CL 103 01/01/2011   CL 103 01/01/2011   CREATININE 0.72 01/01/2011   CREATININE 0.72 01/01/2011   BUN 12 01/01/2011   BUN 12 01/01/2011   CO2 33* 01/01/2011   CO2 33* 01/01/2011   TSH 1.887 01/25/2010   INR 2.50 01/01/2011   HGBA1C  Value: 6.2 (NOTE) The ADA recommends the following therapeutic goal for glycemic control related to Hgb A1c measurement: Goal of therapy: <6.5 Hgb  A1c  Reference: American Diabetes Association: Clinical Practice Recommendations 2010, Diabetes Care, 2010, 33: (Suppl  1).* 11/29/2008   MICROALBUR 1.23 08/25/2006    ASSESSMENT AND PLAN:   1. Morbid obesity:  Viscous cycle of obesity, OA; lack of exercise.  I advised to talk with her PCP to see if gastric bypass is appropriate.  2. Osteoarthritis, with decreased mobility secondary to osteoarthritis of lower back and bilateral knees.  She is under the care of Dr. Charlann Boxer with pain medication. 3. Hyperlipidemia:  On pravastatin with PCP.  4. Hypertension:  On Lasix with PCP.  5. History of gastric antral vascular ectasia:  she was treated in the past by Dr. Elnoria Howard.  She has no evidence of active bleeding now.  She is on chronic oral iron.  6. History of smoking with chronic obstructive pulmonary disease (COPD):  She had tried nicotine patch without success. I recommend smokeless cigarettes.  7. History of pulmonary embolism (PE) on the right, subsegmental, pulmonary embolism in 2009 with diagnosis of left lower extremity extensive deep venous thrombosis (DVT) in May 2012.  Regardless of her thrombophilia testing, she has high risk features with morbid obesity and limited mobility and history of recurrent thrombosis. She would benefit from  lifelong anti coagulation since she doesn't have active bleeding. She has tolerate woman in since May 2012 without major problem. She has [facility] as her Coumadin dose of 4mg  PO  daily has been quite stable. I will see her myself in 6 months.

## 2011-01-02 ENCOUNTER — Ambulatory Visit (INDEPENDENT_AMBULATORY_CARE_PROVIDER_SITE_OTHER): Payer: Medicare Other | Admitting: Internal Medicine

## 2011-01-02 ENCOUNTER — Encounter: Payer: Self-pay | Admitting: Internal Medicine

## 2011-01-02 DIAGNOSIS — I10 Essential (primary) hypertension: Secondary | ICD-10-CM

## 2011-01-02 DIAGNOSIS — Z23 Encounter for immunization: Secondary | ICD-10-CM

## 2011-01-02 DIAGNOSIS — E785 Hyperlipidemia, unspecified: Secondary | ICD-10-CM

## 2011-01-02 DIAGNOSIS — J8410 Pulmonary fibrosis, unspecified: Secondary | ICD-10-CM

## 2011-01-02 DIAGNOSIS — G4730 Sleep apnea, unspecified: Secondary | ICD-10-CM

## 2011-01-02 DIAGNOSIS — M48061 Spinal stenosis, lumbar region without neurogenic claudication: Secondary | ICD-10-CM

## 2011-01-02 HISTORY — DX: Morbid (severe) obesity due to excess calories: E66.01

## 2011-01-02 MED ORDER — OMEPRAZOLE 20 MG PO CPDR: 20.0000 mg | DELAYED_RELEASE_CAPSULE | Freq: Every day | ORAL | Status: DC

## 2011-01-02 MED ORDER — PRAVASTATIN SODIUM 40 MG PO TABS: 40.0000 mg | ORAL_TABLET | Freq: Every day | ORAL | Status: DC

## 2011-01-02 MED ORDER — FERROUS SULFATE 325 (65 FE) MG PO TBEC: 325.0000 mg | DELAYED_RELEASE_TABLET | Freq: Three times a day (TID) | ORAL | Status: DC

## 2011-01-02 NOTE — Patient Instructions (Addendum)
  Go to your local pharmacy or the health department to get the shingles vaccine. It should only cost around $20.  Return to clinic to see Dr. Candy Sledge in 3 months or sooner for powered mobility device evaluation.  Please bring all your medications to your next clinic appointment.

## 2011-01-02 NOTE — Assessment & Plan Note (Signed)
[name] likely has obstructive sleep apnea secondary to her obesity. She would likely benefit from BiPAP. At her next clinic appointment we will refer her for split-night study. In the meantime I will have [facility] monitor her pulse ox while sleeping overnight.

## 2011-01-02 NOTE — Assessment & Plan Note (Signed)
[name] blood pressure is well-controlled today. I see no indication to start antihypertensive therapy at this time.

## 2011-01-02 NOTE — Assessment & Plan Note (Addendum)
[name] is likely at goal. Her last LDL was 96 in April of 2011. We will continue her pravastatin today. She is not fasting so we'll not check lipid panel today however we will check this at her next visit. --Refill pravastatin 40 mg daily

## 2011-01-02 NOTE — Progress Notes (Signed)
Subjective:   [name]   DOB: January 29, 1945 65 y.o.   MRN: [id number]  HPI: [name] is a 65 y.o. woman with past medical history significant for morbid obesity hyperlipidemia, depression, and muscular skeletal pain who presents for followup regarding her mobility issues and oxygen therapy.  [name] states that she would like a power wheelchair. She feels trapped in the home and tach sign. She can only walk approximately 30 steps with a walker and his left short of breath after this exertion. She denies any chest pain. She states she is able to perform her activities of daily life without difficulty such as bathing, toileting and cooking. She states she cannot propel a mechanical wheelchair due to her left hand pain. She states she's had multiple surgeries on her left hand including carpal tunnel surgery.  [name] also states her hematologist Dr. Gaylyn Rong recommended possible bariatric surgery. [name] states that she feels unsure about having this procedure done. She states her orthopedic surgeon would not operate on her knee because "I would never be able to get off the table." Overall she is unsure if she would like to have this procedure done at this time.  [name] also states that she is having to pay for oxygen therapy at home that she does not use. She brought in a letter from [facility] requiring pulse ox readings to justify the continued services. She states that she does not want to continue receiving oxygen therapy as she does not use it and it is very expensive.    Past Medical History  Diagnosis Date  . COPD (chronic obstructive pulmonary disease)   . Depression     followed by Dr. Gwyndolyn Kaufman, [facility]  . GERD (gastroesophageal reflux disease)   . Hypertension   . Hyperlipidemia   . Basal cell carcinoma 09/2001    followed by Dr. Karlyn Agee  . Interstitial cystitis     followed by Dr. Isabel Caprice  . Irritable bowel syndrome    colonoscopy 12/2003 and EGD 1997 WNL, followed by Dr. Marina Goodell  . Spinal stenosis     L3-L4, L4-L5 per MRI 12/2004, status post lumbar decompression 02/2007 by Dr. Gerlene Fee  . Pulmonary embolism 10/2007    on coumadin and followed by Dr. Alexandria Lodge  . OSA on CPAP     noncompliance  . S/P mastectomy 1993    bilateral. status post reconstruction in 1994 by Dr. Benna Dunks  . S/P rhinoplasty 1993  . History of back surgery 02/2007   Current Outpatient Prescriptions  Medication Sig Dispense Refill  . ferrous sulfate 325 (65 FE) MG EC tablet Take 1 tablet (325 mg total) by mouth 3 (three) times daily with meals.  90 tablet  11  . furosemide (LASIX) 40 MG tablet take 1 tablet by mouth once daily  30 tablet  5  . gabapentin (NEURONTIN) 100 MG capsule Take 100 mg by mouth 3 (three) times daily.        Marland Kitchen HYDROcodone-acetaminophen (NORCO) 5-325 MG per tablet Take 1 tablet by mouth every 6 (six) hours as needed.        . lamoTRIgine (LAMICTAL) 100 MG tablet Take 1 tablet (100 mg total) by mouth daily.  90 tablet  2  . LORazepam (ATIVAN) 1 MG tablet Take one tablet in morning and two tablets by mouth at bedtime       . omeprazole (PRILOSEC) 20 MG capsule Take 1 capsule (20 mg total) by mouth daily.  90 capsule  11  . pravastatin (PRAVACHOL) 40 MG tablet Take 1 tablet (40 mg total) by mouth daily.  30 tablet  11  . QUEtiapine (SEROQUEL) 200 MG tablet Take 400 mg by mouth at bedtime.        . sertraline (ZOLOFT) 100 MG tablet Take 200 mg by mouth daily.        Marland Kitchen warfarin (COUMADIN) 4 MG tablet Take 4 mg by mouth daily. As directed by coumadin clinic        . DISCONTD: omeprazole (PRILOSEC) 20 MG capsule Take 1 capsule (20 mg total) by mouth daily.  90 capsule  11   Family History  Problem Relation Age of Onset  . Heart disease Mother   . Cancer Brother    History   Social History  . Marital Status: Divorced    Spouse Name: N/A    Number of Children: N/A  . Years of Education: N/A   Social History Main  Topics  . Smoking status: Former Smoker -- 1.0 packs/day  . Smokeless tobacco: None  . Alcohol Use: No  . Drug Use: No  . Sexually Active: None   Other Topics Concern  . None   Social History Narrative  . None   Review of Systems: Constitutional: Denies fever, chills, diaphoresis, appetite change. She endorses chronic fatigue HEENT: Denies  congestion, sore throat, rhinorrhea, sneezing, mouth sores, trouble swallowing, Respiratory: Denies SOB, , cough, chest tightness,  and wheezing. Endorses dyspnea on exertion DOE  Cardiovascular: Denies chest pain, palpitations. Endorses leg swelling Gastrointestinal: Denies nausea, vomiting, abdominal pain, constipation, blood in stool and abdominal distention.  endorses spastic colon and diarrhea. Genitourinary: Denies dysuria, urgency, frequency, hematuria, flank pain and difficulty urinating.  Musculoskeletal: Denies endorses back pain as well as left knee pain and left hand pain all chronic in nature. Skin: Denies pallor, rash and wound.  Neurological: Denies dizziness, seizures, syncope, weakness, light-headedness, numbness and headaches.    Objective:  Physical Exam: Filed Vitals:   01/02/11 1527 01/02/11 1530  BP: 130/69 130/69  Pulse: 75 75  Temp: 97.6 F (36.4 C)   TempSrc: Oral   Height: 5\' 7"  (1.702 m)   SpO2:  96%   Constitutional: Vital signs reviewed.  Patient is an obese woman in no acute distress and cooperative with exam. Alert and oriented x3.  Head: Normocephalic and atraumatic Eyes: PERRL, EOMI, conjunctivae normal, No scleral icterus.  Cardiovascular: RRR, S1 normal, S2 normal, no MRG, pulses symmetric and intact bilaterally Pulmonary/Chest: CTAB, no wheezes, rales, or rhonchi Abdominal: Very obese. Soft. Non-tender, non-distended, bowel sounds are normal, no masses, organomegaly, or guarding present.  GU: no CVA tenderness Musculoskeletal: There is no effusion at the left knee joint. There is some crepitus with  flexion and extension. There is no swelling or redness. She does have lower show any pitting edema 2+.  Neurological: A&O x3, Strenght is normal and symmetric bilaterally, cranial nerve II-XII are grossly intact, no focal motor deficit, sensory intact to light touch bilaterally.  Skin: Warm, dry and intact. No rash, cyanosis, or clubbing.  Psychiatric: Depressed mood and affect. speech and behavior is normal. Judgment and thought content normal. Cognition and memory are normal.   Assessment & Plan:   See problem-based assessment and plan charting.

## 2011-01-02 NOTE — Assessment & Plan Note (Signed)
[name] is under the care of Dr. Ethelene Hal who is her pain doctor. She only receives pain medications for him and feels satisfied with her current regimen. No changes were made. I did note in her FYI section that she should not receive any controlled substances from our clinic. She does receive benzodiazepines from her mental health provider.

## 2011-01-02 NOTE — Progress Notes (Signed)
PSO2 with walking with walker 93-96% - room air. [name] Viral Schramm RN 01/02/11 4:20PM

## 2011-01-02 NOTE — Assessment & Plan Note (Addendum)
I discussed with [name] the possibility of getting bariatric surgery. Given her ambivalence about the procedure and the very rigorous requirements necessary to qualify [name] endorsed to desire to wait 6 months while she thought about having the procedure before embarking on this endeavor.  She does want to pursue power wheelchair to improve her mobility. I told her that was concerned that this might decrease her physical activity even more there by compounding her multiple problems. However she did want to pursue this to improve quality of life. I advised her to contact [facility] regarding setting up a power wheelchair consultation visit. She also can followup through the [facility]. I told her that we had much better results with [facility] rather than [facility].

## 2011-01-02 NOTE — Assessment & Plan Note (Addendum)
[name] no longer desires oxygen therapy. She states she does not use it. In the clinic today her oxygen saturation was 96% on room air while at rest. It did drop down to 93% on room air with ambulation. Given this level of oxygenation she does not require oxygen therapy. I will fax a prescription that states that [name] should be discharged from home oxygen therapy by [facility]. She does have a diagnosis of obstructive sleep apnea and may require oxygen at night. I have requested [facility] monitor pulse ox overnight while not on oxygen therapy.

## 2011-01-03 NOTE — Progress Notes (Signed)
I have reviewed and discussed the care of this patient in detail with the resident physician including pertinent patient records, physical exam findings and data. I agree with details of this encounter.   

## 2011-01-07 ENCOUNTER — Encounter: Payer: Self-pay | Admitting: Licensed Clinical Social Worker

## 2011-01-07 ENCOUNTER — Telehealth: Payer: Self-pay | Admitting: Licensed Clinical Social Worker

## 2011-01-07 NOTE — Telephone Encounter (Signed)
Patient called back and I explained to her how we go about evaluating for power wheel chair.   She said that she could not get here earlier than March due to multiple other doctor appointments and so I encouraged her to follow-up for appointment to schedule a visit for March specifically for power wheelchair assessment with her PCP and [facility] representative Aleen Campi.

## 2011-01-07 NOTE — Progress Notes (Signed)
Submitted referral order and information to [name] in person for overnight pulse oxygen testing.  [name] said he would follow and let me know outcome.

## 2011-01-24 ENCOUNTER — Other Ambulatory Visit: Payer: Self-pay | Admitting: Pharmacist

## 2011-01-24 DIAGNOSIS — I824Y9 Acute embolism and thrombosis of unspecified deep veins of unspecified proximal lower extremity: Secondary | ICD-10-CM

## 2011-01-28 ENCOUNTER — Ambulatory Visit: Payer: Medicare Other

## 2011-01-28 ENCOUNTER — Other Ambulatory Visit: Payer: Medicare Other | Admitting: Lab

## 2011-01-31 ENCOUNTER — Other Ambulatory Visit (HOSPITAL_BASED_OUTPATIENT_CLINIC_OR_DEPARTMENT_OTHER): Payer: Medicare Other | Admitting: Lab

## 2011-01-31 ENCOUNTER — Ambulatory Visit: Payer: Medicare Other

## 2011-01-31 DIAGNOSIS — I824Y9 Acute embolism and thrombosis of unspecified deep veins of unspecified proximal lower extremity: Secondary | ICD-10-CM

## 2011-01-31 LAB — PROTIME-INR: Protime: 38.4 Seconds — ABNORMAL HIGH (ref 10.6–13.4)

## 2011-01-31 NOTE — Progress Notes (Unsigned)
INR slightly supratherapeutic today.  Asymptomatic. Cont same dose of 4 mg/day. Return in 4 weeks. Marily Lente, Pharm.D.

## 2011-02-03 ENCOUNTER — Other Ambulatory Visit: Payer: Self-pay | Admitting: Internal Medicine

## 2011-02-03 DIAGNOSIS — G4730 Sleep apnea, unspecified: Secondary | ICD-10-CM

## 2011-02-03 NOTE — Progress Notes (Signed)
O: Pt had a nighttime o2 monitoring that showed significant desats down to the low 70s.  Received info from [facility] with results.    A/P: Night time desats requiring home O2.  Will fax in data to EMR.  Order written to put on 2 L o2 by nasal cannula at night. I will have this faxed to [facility].

## 2011-02-05 ENCOUNTER — Telehealth: Payer: Self-pay | Admitting: Pharmacist

## 2011-02-06 ENCOUNTER — Encounter: Payer: Medicare Other | Admitting: Internal Medicine

## 2011-02-17 ENCOUNTER — Telehealth: Payer: Self-pay | Admitting: *Deleted

## 2011-02-17 NOTE — Telephone Encounter (Signed)
Pt called and states [facility] called with payment for $ 2,080. For Oxygen services. Pt states Dr Candy Sledge stated she did not need O2 any more.  [facility] did pulse ox. Over night and states her sats were 64% for 4 hours. I can't find the results of the test.  Not in MD box.  Also she was waiting for a wheelchair referral.  Pt # [phone]

## 2011-02-18 NOTE — Telephone Encounter (Signed)
Pt called and informed Rx for O2 has been sent to [facility] for O2 at night.  Chilon will make appointment for [facility] evaluation with PCP and [facility]. Pt informed.

## 2011-03-03 ENCOUNTER — Other Ambulatory Visit: Payer: Self-pay | Admitting: Pharmacist

## 2011-03-03 DIAGNOSIS — I2699 Other pulmonary embolism without acute cor pulmonale: Secondary | ICD-10-CM

## 2011-03-04 ENCOUNTER — Other Ambulatory Visit: Payer: Medicare Other | Admitting: Lab

## 2011-03-04 ENCOUNTER — Ambulatory Visit: Payer: Medicare Other

## 2011-03-10 ENCOUNTER — Other Ambulatory Visit: Payer: Self-pay | Admitting: Pharmacist

## 2011-03-11 ENCOUNTER — Other Ambulatory Visit: Payer: Medicare Other | Admitting: Lab

## 2011-03-11 ENCOUNTER — Telehealth: Payer: Self-pay | Admitting: Pharmacist

## 2011-03-11 ENCOUNTER — Ambulatory Visit: Payer: Medicare Other

## 2011-03-12 ENCOUNTER — Telehealth: Payer: Self-pay | Admitting: *Deleted

## 2011-03-12 NOTE — Telephone Encounter (Signed)
Pt and [facility] called 4 times today wanting order for underpads to be signed and faxed back to [facility], i had dr rogers sign and sharonb. Faxed to [facility]

## 2011-03-25 ENCOUNTER — Ambulatory Visit: Payer: Medicare Other

## 2011-03-25 ENCOUNTER — Other Ambulatory Visit: Payer: Medicare Other

## 2011-03-25 ENCOUNTER — Telehealth: Payer: Self-pay | Admitting: Pharmacist

## 2011-03-25 NOTE — Telephone Encounter (Signed)
Left message on home number to call back and reschedule her coumadin clinic appmt.

## 2011-03-27 ENCOUNTER — Telehealth: Payer: Self-pay | Admitting: Pharmacist

## 2011-03-27 NOTE — Telephone Encounter (Signed)
Pt called to tell us she missed her lab/clinic appointment on 03/25/11 because she was at another doctor's office (Dr. Orlan Leavens). 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 The date will be set once she has paperwork signed from her primary care physician. She wanted to know what to do with her Coumadin from Dr. Gaylyn Rong.  Per Dr. Gaylyn Rong, he will instruct her on the anticoagulation plans around the surgery once the date for surgery is determined. Dr. Gaylyn Rong would like a 2 weeks notice prior to surgery date. I called the patient and left her a message regarding Dr. Lodema Pilot instructions.

## 2011-03-31 ENCOUNTER — Other Ambulatory Visit: Payer: Self-pay | Admitting: *Deleted

## 2011-03-31 DIAGNOSIS — I824Y9 Acute embolism and thrombosis of unspecified deep veins of unspecified proximal lower extremity: Secondary | ICD-10-CM

## 2011-03-31 MED ORDER — WARFARIN SODIUM 4 MG PO TABS: ORAL_TABLET | ORAL | Status: DC

## 2011-04-01 ENCOUNTER — Encounter: Payer: Self-pay | Admitting: Pharmacist

## 2011-04-01 NOTE — Progress Notes (Signed)
I spoke w/ pt over phone today to touch base w/ her re: surgery.  She is still waiting to hear back about the final date.  She thinks that it will be scheduled in the next 2-3 weeks. Given that, we will wait to have her come in for protime to avoid extra visits especially if she is going to come off the Coumadin anyway for surgery. Pt is in agreement w/ this plan. If surgery is not scheduled in the next 2-3 weeks, we will go ahead & have her come for protime/clinic visit. Marily Lente, Pharm.D.

## 2011-04-17 ENCOUNTER — Encounter: Payer: Self-pay | Admitting: Internal Medicine

## 2011-04-17 ENCOUNTER — Telehealth: Payer: Self-pay | Admitting: Pharmacist

## 2011-04-17 ENCOUNTER — Ambulatory Visit (INDEPENDENT_AMBULATORY_CARE_PROVIDER_SITE_OTHER): Payer: PRIVATE HEALTH INSURANCE | Admitting: Internal Medicine

## 2011-04-17 VITALS — BP 122/70 | HR 73 | Temp 97.0°F | Ht 67.0 in

## 2011-04-17 DIAGNOSIS — M48061 Spinal stenosis, lumbar region without neurogenic claudication: Secondary | ICD-10-CM

## 2011-04-17 NOTE — Patient Instructions (Addendum)
We apologize that we are not able to perform your preoperative surgical risk evaluation today. We will reschedule your for our first available appointment. You should expect a call Monday to schedule this appointment. At that time he will also have her fasting blood sugar drawn and fasting lipid panel drawn. Please only drink water and black coffee prior to this visit. Do not eat anything. We will organize your per wheelchair evaluation after surgery.  I strongly recommend that you discontinue smoking prior to surgery.

## 2011-04-17 NOTE — Progress Notes (Signed)
Subjective:     Patient ID: [name], female   DOB: Jun 15, 1945, 66 y.o.   MRN: [id number]  HPI [name]  Is a 66 year old woman with PMH sig for recurrent DVT, interstitial lung disease, spinal stenosis, obstructive sleep apnea, tobacco abuse, morbid obesity, hyperlipidemia, and hypertension.  She presents for multiple reasons today due to a scheduling miscommunication. She requires power wheel chair eval, pre-op risk assessment for surgery and would also like to be evaluated for a power lift.    Jennifer Frisby states she will have left wrist carpectomy performed for osteonecrosis. She states that she will need the lift because she will be unable to move from sit to stand unaided after surgery because her left wrist will be in a cast.  She has longstanding back and hip pain that also limits her mobility and requires the use of her wrist to use a walker and wheelchair. She is currently using a loaner mechanical wheelchair with moderate success. She is able to move from sitting to standing with difficulty, and walk short distances with her walker. She is currently able to perform her ADLs at home.   [name] from [employer] tells me that a power left is likely not going to be covered by insurance and the cost out of pocket is approximately $250. She also tells me that the primary visit diagnosis for today needs to be for a power wheelchair only in order for this to be covered. She also tells me that given the approximately 4-6 weeks turnaround time between the power wheelchair evaluation and their required delivery date, that advanced with likely be unable to deliver the power wheelchair due to the patient's surgery and subsequent rehabilitation. This would require the patient to restart the process of power wheelchair evaluation from the beginning.    Review of Systems Patient denies chest pain, dyspnea at rest, and exertional angina.    Objective:   Physical Exam  Constitutional: She  is oriented to person, place, and time. No distress.  HENT:  Head: Normocephalic.  Cardiovascular: Normal rate and regular rhythm.   Neurological: She is alert and oriented to person, place, and time. No cranial nerve deficit.  Skin: Skin is warm and dry. No rash noted. She is not diaphoretic. No pallor.  Psychiatric: She has a normal mood and affect. Her behavior is normal.       Assessment:/plan      Given that the patient requires multiple items addressed and the complex interconnected factors that we needed to unravel today regarding her multiple needs, we do not have time to perform either a power wheelchair or preoperative risk assessment. I have determined that Jennifer. Armentrout's surgery has the greatest priority at this point.      I will have the patient return to clinic and be seen by an [facility] resident for preoperative risk assessment ( I will be on night float and cannot see the patient until May). I recommended at this visit be scheduled for full one hour given the patient's medical complexity. I strongly recommend avoidance of general anesthesia given her interstitial lung disease, and favor regional anesthesia. The patient must discontinue smoking.  The patient will be evaluated for her perioperative risk at our next available full hour appointment. I apologized to the patient for this inconvenience.  Given that we would likely need to start all over with power wheelchair evaluation after the surgery is performed, I elected to defer this until after surgery. [name] from  [facility] agreed that this would be the best course of action. I strongly recommend inpatient skilled nursing rehabilitation post surgery, as the patient will temporarily have lost the use of her left arm which she relies on extensively for her current (limited) mobility.  She states she was previously in [facility] skilled nursing facility which was agreeable to her and will be her preference if it's available. Once  the patient has completed her rehabilitation we will readdress the power wheelchair issue. At that time we will also have a better idea of what her mobility needs truly are.

## 2011-04-17 NOTE — Telephone Encounter (Signed)
Called to inquire about whether her wrist surgery has been scheduled.  Dr. Gaylyn Rong requests at least 2 weeks lead notice to be able to formulate an anticoagulation plan surrounding the procedure.  Pt told me that she has been held up by needing medical workup prior to the surgery.  She was actually on her way to her MD so she can complete the workup and obtain the Medical Release form.  Her surgery should be scheduled shortly after the surgeon has that in hand.  Pt understands that she should call as soon as possible when she finds out the date.  Will continue to wait to obtain another INR, but if the scheduled time for her surgery is more than 3 weeks away, she understand that she will need to probably come in for an INR prior to that time.

## 2011-04-22 ENCOUNTER — Emergency Department (HOSPITAL_COMMUNITY)
Admission: EM | Admit: 2011-04-22 | Discharge: 2011-04-22 | Disposition: A | Discharge status: Home / Self Care | Payer: PRIVATE HEALTH INSURANCE | Attending: Emergency Medicine | Admitting: Emergency Medicine

## 2011-04-22 ENCOUNTER — Emergency Department (HOSPITAL_COMMUNITY): Payer: PRIVATE HEALTH INSURANCE

## 2011-04-22 ENCOUNTER — Encounter (HOSPITAL_COMMUNITY): Payer: Self-pay | Admitting: Emergency Medicine

## 2011-04-22 ENCOUNTER — Telehealth: Payer: Self-pay | Admitting: *Deleted

## 2011-04-22 DIAGNOSIS — G4733 Obstructive sleep apnea (adult) (pediatric): Secondary | ICD-10-CM | POA: Insufficient documentation

## 2011-04-22 DIAGNOSIS — I10 Essential (primary) hypertension: Secondary | ICD-10-CM | POA: Insufficient documentation

## 2011-04-22 DIAGNOSIS — E785 Hyperlipidemia, unspecified: Secondary | ICD-10-CM | POA: Insufficient documentation

## 2011-04-22 DIAGNOSIS — Z79899 Other long term (current) drug therapy: Secondary | ICD-10-CM | POA: Insufficient documentation

## 2011-04-22 DIAGNOSIS — S93409A Sprain of unspecified ligament of unspecified ankle, initial encounter: Secondary | ICD-10-CM | POA: Insufficient documentation

## 2011-04-22 DIAGNOSIS — K219 Gastro-esophageal reflux disease without esophagitis: Secondary | ICD-10-CM | POA: Insufficient documentation

## 2011-04-22 DIAGNOSIS — J449 Chronic obstructive pulmonary disease, unspecified: Secondary | ICD-10-CM | POA: Insufficient documentation

## 2011-04-22 DIAGNOSIS — J4489 Other specified chronic obstructive pulmonary disease: Secondary | ICD-10-CM | POA: Insufficient documentation

## 2011-04-22 DIAGNOSIS — F17200 Nicotine dependence, unspecified, uncomplicated: Secondary | ICD-10-CM | POA: Insufficient documentation

## 2011-04-22 DIAGNOSIS — W19XXXA Unspecified fall, initial encounter: Secondary | ICD-10-CM | POA: Insufficient documentation

## 2011-04-22 DIAGNOSIS — K589 Irritable bowel syndrome without diarrhea: Secondary | ICD-10-CM | POA: Insufficient documentation

## 2011-04-22 MED ORDER — TRAMADOL HCL 50 MG PO TABS: 50.0000 mg | ORAL_TABLET | Freq: Four times a day (QID) | ORAL | Status: AC | PRN

## 2011-04-22 NOTE — Progress Notes (Signed)
Orthopedic Tech Progress Note Patient Details:  [name] 1945/07/07 [id number]  Type of Splint: Other (comment) Splint Location: left ankle Splint Interventions: Application    Kymoni Monday T 04/22/2011, 4:43 PM

## 2011-04-22 NOTE — ED Notes (Signed)
Pt to ED via EMS with c/o left ankle pain.  Onset Friday after falling.  States was walking with walker and tripped on rug.

## 2011-04-22 NOTE — Discharge Instructions (Signed)
Ankle Sprain An ankle sprain is an injury to the strong, fibrous tissues (ligaments) that hold the bones of your ankle joint together.  CAUSES Ankle sprain usually is caused by a fall or by twisting your ankle. People who participate in sports are more prone to these types of injuries.  SYMPTOMS  Symptoms of ankle sprain include:  Pain in your ankle. The pain may be present at rest or only when you are trying to stand or walk.   Swelling.   Bruising. Bruising may develop immediately or within 1 to 2 days after your injury.   Difficulty standing or walking.  DIAGNOSIS  Your caregiver will ask you details about your injury and perform a physical exam of your ankle to determine if you have an ankle sprain. During the physical exam, your caregiver will press and squeeze specific areas of your foot and ankle. Your caregiver will try to move your ankle in certain ways. An X-ray exam may be done to be sure a bone was not broken or a ligament did not separate from one of the bones in your ankle (avulsion).  TREATMENT  Certain types of braces can help stabilize your ankle. Your caregiver can make a recommendation for this. Your caregiver may recommend the use of medication for pain. If your sprain is severe, your caregiver may refer you to a surgeon who helps to restore function to parts of your skeletal system (orthopedist) or a physical therapist. HOME CARE INSTRUCTIONS  Apply ice to your injury for 1 to 2 days or as directed by your caregiver. Applying ice helps to reduce inflammation and pain.  Put ice in a plastic bag.   Place a towel between your skin and the bag.   Leave the ice on for 15 to 20 minutes at a time, every 2 hours while you are awake.   Take over-the-counter or prescription medicines for pain, discomfort, or fever only as directed by your caregiver.   Keep your injured leg elevated, when possible, to lessen swelling.   If your caregiver recommends crutches, use them as  instructed. Gradually, put weight on the affected ankle. Continue to use crutches or a cane until you can walk without feeling pain in your ankle.   If you have a plaster splint, wear the splint as directed by your caregiver. Do not rest it on anything harder than a pillow the first 24 hours. Do not put weight on it. Do not get it wet. You may take it off to take a shower or bath.   You may have been given an elastic bandage to wear around your ankle to provide support. If the elastic bandage is too tight (you have numbness or tingling in your foot or your foot becomes cold and blue), adjust the bandage to make it comfortable.   If you have an air splint, you may blow more air into it or let air out to make it more comfortable. You may take your splint off at night and before taking a shower or bath.   Wiggle your toes in the splint several times per day if you are able.  SEEK MEDICAL CARE IF:   You have an increase in bruising, swelling, or pain.   Your toes feel cold.   Pain relief is not achieved with medication.  SEEK IMMEDIATE MEDICAL CARE IF: Your toes are numb or blue or you have severe pain. MAKE SURE YOU:   Understand these instructions.   Will watch your condition.     Will get help right away if you are not doing well or get worse.  Document Released: 01/06/2005 Document Revised: 12/26/2010 Document Reviewed: 08/11/2007 [employer] Patient Information 2012 [employer]. 

## 2011-04-22 NOTE — Telephone Encounter (Signed)
Agree with ER visit. As for the hygiene issues - Can be addressed at the [facility] visit.

## 2011-04-22 NOTE — ED Provider Notes (Signed)
History     CSN: [id number]  Arrival date & time 04/22/11  1459   First MD Initiated Contact with Patient 04/22/11 1513      Chief Complaint  Patient presents with  . Ankle Pain     HPI Pt to ED via EMS with c/o left ankle pain. Onset Friday after falling. States was walking with walker and tripped on rug  Past Medical History  Diagnosis Date  . COPD (chronic obstructive pulmonary disease)   . Depression     followed by Dr. Gwyndolyn Kaufman, [facility]  . GERD (gastroesophageal reflux disease)   . Hypertension   . Hyperlipidemia   . Basal cell carcinoma 09/2001    followed by Dr. Karlyn Agee  . Interstitial cystitis     followed by Dr. Isabel Caprice  . Irritable bowel syndrome     colonoscopy 12/2003 and EGD 1997 WNL, followed by Dr. Marina Goodell  . Spinal stenosis     L3-L4, L4-L5 per MRI 12/2004, status post lumbar decompression 02/2007 by Dr. Gerlene Fee  . Pulmonary embolism 10/2007    on coumadin and followed by Dr. Alexandria Lodge  . OSA on CPAP     noncompliance  . S/P mastectomy 1993    bilateral. status post reconstruction in 1994 by Dr. Benna Dunks  . S/P rhinoplasty 1993  . History of back surgery 02/2007    Past Surgical History  Procedure Date  . Cervical discectomy 11/1995 and 05/2002    by Dr. Gerlene Fee  . Abdominal hysterectomy 1976  . Bladder suspension 1993  . Orif ankle fracture 1993  . Lumbar laminectomy/decompression microdiscectomy 02/2007    by Dr. Gerlene Fee    Family History  Problem Relation Age of Onset  . Heart disease Mother   . Cancer Brother     History  Substance Use Topics  . Smoking status: Current Everyday Smoker -- 0.5 packs/day    Types: Cigarettes  . Smokeless tobacco: Not on file  . Alcohol Use: No    OB History    Grav Para Term Preterm Abortions TAB SAB Ect Mult Living                  Review of Systems  All other systems reviewed and are negative.    Allergies  Chlorpromazine hcl; Codeine; Morphine; and Sulfonamide derivatives  Home Medications    Current Outpatient Rx  Name Route Sig Dispense Refill  . FERROUS SULFATE 325 (65 FE) MG PO TBEC Oral Take 325 mg by mouth 3 (three) times daily with meals.    . FUROSEMIDE 40 MG PO TABS Oral Take 40 mg by mouth daily.     Marland Kitchen GABAPENTIN 100 MG PO CAPS Oral Take 100 mg by mouth 3 (three) times daily.      Marland Kitchen LAMOTRIGINE 100 MG PO TABS Oral Take 100 mg by mouth daily.    Marland Kitchen LORAZEPAM 1 MG PO TABS Oral Take 1-2 mg by mouth 2 (two) times daily. Take one tablet in morning and two tablets by mouth at bedtime    . OMEPRAZOLE 20 MG PO CPDR Oral Take 20 mg by mouth daily.    . OXYCODONE HCL 5 MG PO CAPS Oral Take 5 mg by mouth every 8 (eight) hours as needed. For pain    . PRAVASTATIN SODIUM 40 MG PO TABS Oral Take 40 mg by mouth daily.    . QUETIAPINE FUMARATE 200 MG PO TABS Oral Take 400 mg by mouth at bedtime.      . SERTRALINE HCL  100 MG PO TABS Oral Take 200 mg by mouth daily.      . WARFARIN SODIUM 4 MG PO TABS Oral Take 4 mg by mouth daily.    . TRAMADOL HCL 50 MG PO TABS Oral Take 1 tablet (50 mg total) by mouth every 6 (six) hours as needed for pain. 20 tablet 0    BP 140/60  Pulse 75  Temp(Src) 97.9 F (36.6 C) (Oral)  Resp 20  SpO2 97%  Physical Exam  Nursing note and vitals reviewed. Constitutional: She is oriented to person, place, and time. She appears well-developed and well-nourished. No distress.       Morbidly obese  HENT:  Head: Normocephalic and atraumatic.  Eyes: Pupils are equal, round, and reactive to light.  Neck: Normal range of motion.  Cardiovascular: Normal rate and intact distal pulses.   Pulmonary/Chest: No respiratory distress.  Abdominal: Normal appearance. She exhibits no distension. There is no tenderness. There is no rebound.  Musculoskeletal: She exhibits tenderness.       Feet:  Neurological: She is alert and oriented to person, place, and time. No cranial nerve deficit.  Skin: Skin is warm and dry. No rash noted.  Psychiatric: She has a normal mood  and affect. Her behavior is normal.    ED Course  Procedures (including critical care time)  Labs Reviewed - No data to display Dg Ankle Complete Left  04/22/2011  *RADIOLOGY REPORT*  Clinical Data: Pain and swelling.  No known injury.  LEFT ANKLE COMPLETE - 3+ VIEW  Comparison: None.  Findings: No acute bony or joint abnormality is identified.  No degenerative change is seen.  Soft tissues are unremarkable.  IMPRESSION: Negative study.  Original Report Authenticated By: Bernadene Bell. D'ALESSIO, M.D.     1. Ankle sprain       MDM          Nelia Shi, MD 04/22/11 [phone]

## 2011-04-22 NOTE — Telephone Encounter (Signed)
Agree with eval.  Was there anything that needed to be provided today? Thanks

## 2011-04-22 NOTE — Telephone Encounter (Signed)
Call from [place] [facility]  # Cell [phone] Office [phone] ( fax # [phone])  Need copy of request for [facility]  She is with pt and states pt fell on Friday.  She is c/o pain, bruising and swelling to left foot. Pt is not able to bear weight or ambulate on that foot. She is not able to get to rest room and is asking for a Rx for a bed side commode from [facility]. She is taking vicodin for pain with relief. Pt is on coumadin. I told pt that she needs to be evaluated, she has to get transportation and can't come in today.   I did schedule appointment for tomorrow at 10:45.

## 2011-04-22 NOTE — Telephone Encounter (Signed)
Another call from Marcelino Duster, from the [facility] stating pt can not get a ride here tomorrow.  She is going to call [facility] and bring pt to ED for evaluation of foot. She also wanted to inform us that pt does not use the rest room.  She voids in her recliner on chucks. She was found sitting in feces and urine today.  Concern is skin breakdown. They are reporting this to [facility]

## 2011-04-22 NOTE — ED Notes (Signed)
Placed call to [facility] for transport back home 

## 2011-04-22 NOTE — Telephone Encounter (Signed)
No, will make appointment for medical clearance tomorrow after OV

## 2011-04-22 NOTE — ED Notes (Signed)
Discharge instructions reviewed with pt.  Verbalizes understanding.  No questions asked; no further c/o's voiced.  Pt awaiting arrival of [facility] to take to residence.  NAD noted.

## 2011-04-23 ENCOUNTER — Ambulatory Visit: Payer: Medicare Other | Admitting: Internal Medicine

## 2011-04-29 ENCOUNTER — Other Ambulatory Visit: Payer: Self-pay | Admitting: Pharmacist

## 2011-04-29 ENCOUNTER — Telehealth: Payer: Self-pay | Admitting: *Deleted

## 2011-04-29 DIAGNOSIS — I824Y9 Acute embolism and thrombosis of unspecified deep veins of unspecified proximal lower extremity: Secondary | ICD-10-CM

## 2011-04-29 MED ORDER — WARFARIN SODIUM 4 MG PO TABS: 4.0000 mg | ORAL_TABLET | Freq: Every day | ORAL | Status: DC

## 2011-04-29 NOTE — Telephone Encounter (Signed)
Call from [facility] pharmacist  @ cancer center  # [phone].   Request we do a finger stick INR on pt during visit on 4/11 This will save the pt a trip in, as transportation is a problem.  Call results to [facility] Pharmacist, Heather @ [phone]

## 2011-04-29 NOTE — Progress Notes (Signed)
Pt has appt w/ [facility] on 4/11 at 1:15 pm (pre-op evaluation).  Since pt has not had INR drawn at [facility] recently & pt called for refill of her Coumadin, I have arranged w/ Dondra Spry, RN at [facility] clinic (ph # [phone]) to have pt go to their lab & have fingerstick INR on 4/11. They will call us w/ result & we can call pt w/ instructions on dosing.  We'll continue to follow along for surgery plans as well. Marily Lente, Pharm.D.

## 2011-04-30 ENCOUNTER — Encounter: Payer: Medicare Other | Admitting: Internal Medicine

## 2011-05-01 ENCOUNTER — Ambulatory Visit (HOSPITAL_COMMUNITY)
Admission: RE | Admit: 2011-05-01 | Discharge: 2011-05-01 | Disposition: A | Discharge status: Home / Self Care | Payer: PRIVATE HEALTH INSURANCE | Source: Ambulatory Visit | Attending: Internal Medicine | Admitting: Internal Medicine

## 2011-05-01 ENCOUNTER — Encounter: Payer: Self-pay | Admitting: Internal Medicine

## 2011-05-01 ENCOUNTER — Ambulatory Visit (INDEPENDENT_AMBULATORY_CARE_PROVIDER_SITE_OTHER): Payer: PRIVATE HEALTH INSURANCE | Admitting: Internal Medicine

## 2011-05-01 ENCOUNTER — Ambulatory Visit (HOSPITAL_BASED_OUTPATIENT_CLINIC_OR_DEPARTMENT_OTHER): Payer: Medicare Other | Admitting: Pharmacist

## 2011-05-01 VITALS — BP 119/57 | HR 77 | Temp 97.7°F

## 2011-05-01 DIAGNOSIS — J8410 Pulmonary fibrosis, unspecified: Secondary | ICD-10-CM | POA: Insufficient documentation

## 2011-05-01 DIAGNOSIS — Z01818 Encounter for other preprocedural examination: Secondary | ICD-10-CM | POA: Insufficient documentation

## 2011-05-01 DIAGNOSIS — I824Y9 Acute embolism and thrombosis of unspecified deep veins of unspecified proximal lower extremity: Secondary | ICD-10-CM

## 2011-05-01 DIAGNOSIS — Z79899 Other long term (current) drug therapy: Secondary | ICD-10-CM

## 2011-05-01 DIAGNOSIS — Z7901 Long term (current) use of anticoagulants: Secondary | ICD-10-CM

## 2011-05-01 DIAGNOSIS — I517 Cardiomegaly: Secondary | ICD-10-CM | POA: Insufficient documentation

## 2011-05-01 DIAGNOSIS — Z01810 Encounter for preprocedural cardiovascular examination: Secondary | ICD-10-CM | POA: Insufficient documentation

## 2011-05-01 LAB — POCT INR: INR: 3.4

## 2011-05-01 NOTE — Progress Notes (Signed)
INR drawn at PCP's office is slightly supratherapeutic (3.4).  No significant medication changes - she has been using Percocet for pain related to her wrist and ankle injuries.  No problems with bleeding or bruising, although her fingerprick was slow to stop bleeding today.  No other complaints.  Will continue current dose of 4mg  daily, and recheck INR in the Coumadin Clinic in 2 weeks.  I have called and arranged for her transportation through [employer].

## 2011-05-01 NOTE — Progress Notes (Signed)
Patient ID: [name], female   DOB: 01-18-1946, 66 y.o.   MRN: [id number]  66 Y/o with pmh listed below comes for pre-op clearance for orthopedic hand surgery which is not yet scheduled.  Complaint with medications No medications side effect Uptodate on refills   Physical exam  General Appearance:     Filed Vitals:   05/01/11 1327  BP: 119/57  Pulse: 77  Temp: 97.7 F (36.5 C)  TempSrc: Oral     Alert, cooperative, no distress, appears stated age  Head:    Normocephalic, without obvious abnormality, atraumatic  Eyes:    PERRL, conjunctiva/corneas clear, EOM's intact, fundi    benign, both eyes       Neck:   Supple, symmetrical, trachea midline, no adenopathy;       thyroid:  No enlargement/tenderness/nodules; no carotid   bruit or JVD  Lungs:     Clear to auscultation bilaterally, respirations unlabored  Chest wall:    No tenderness or deformity  Heart:    Regular rate and rhythm, S1 and S2 normal, no murmur, rub   or gallop  Abdomen:     Soft, non-tender, bowel sounds active all four quadrants,    no masses, no organomegaly  Extremities: Left foot wrapped in ACe wrap for a sprain 1 week ago, no fracture  Pulses:   2+ and symmetric all extremities  Skin:   Skin color, texture, turgor normal, no rashes or lesions  Neurologic:  nonfocal grossly    ROS  Constitutional: Denies fever, chills, diaphoresis, appetite change and fatigue.  Respiratory: has  DOE, denies cough, chest tightness,  and wheezing.   Cardiovascular: Denies chest pain, palpitations and leg swelling.  Gastrointestinal: Denies nausea, vomiting, abdominal pain, diarrhea, constipation, blood in stool and abdominal distention.  Skin: Denies pallor, rash and wound.  Neurological: Denies dizziness, light-headedness, numbness and headaches.

## 2011-05-01 NOTE — Assessment & Plan Note (Signed)
1. Cardiac risk- no symptoms, no known cardiac history, EKG in [facility] today unremarkable, Echo 2010 showed EF <55% without wall motion abnormality and no diastolic dysfunction or sever valvular dysfunction- Minimal Risk  2. Pulmonary- dyspnea on exertion, poor exercise capacity, morbid obesity, CT angio 2 years ago showed diffuse ILD, will repeat CXR and PFTs- moderate to severe risk of pulmonary complication with general anesthesia and prolonged surgery. Will need pulmonary rehab after surgery   3. Bleeding risk- on coumadin for hypercoagulability of unknown cause, will need to stop coumadin 2 weeks before surgery to transition to lovenox by her coumadin clinic ([facility] at [facility])   4. General health - unable to assess mobility well because of sprain in right foot, but overall poor functional status, wheelchair dependence, obesity and advanced age/

## 2011-05-01 NOTE — Patient Instructions (Signed)
Schedule an appointment with your coumadin doctor 2 weeks before the surgery to transition your coumadin to lovenox I will call you with the test results from today .

## 2011-05-02 LAB — CBC WITH DIFFERENTIAL/PLATELET
Basophils Absolute: 0 10*3/uL (ref 0.0–0.1)
Basophils Relative: 0 % (ref 0–1)
Eosinophils Absolute: 0.2 10*3/uL (ref 0.0–0.7)
Eosinophils Relative: 3 % (ref 0–5)
HCT: 41.4 % (ref 36.0–46.0)
Lymphocytes Relative: 19 % (ref 12–46)
Lymphs Abs: 1.3 10*3/uL (ref 0.7–4.0)
MCH: 26.7 pg (ref 26.0–34.0)
MCV: 90 fL (ref 78.0–100.0)
Monocytes Absolute: 0.5 10*3/uL (ref 0.1–1.0)
Neutro Abs: 4.9 10*3/uL (ref 1.7–7.7)
RBC: 4.6 MIL/uL (ref 3.87–5.11)
RDW: 16.5 % — ABNORMAL HIGH (ref 11.5–15.5)
WBC: 6.9 10*3/uL (ref 4.0–10.5)

## 2011-05-02 LAB — COMPREHENSIVE METABOLIC PANEL
ALT: <8 U/L (ref 0–35)
AST: 9 U/L (ref 0–37)
Albumin: 3.8 g/dL (ref 3.5–5.2)
Alkaline Phosphatase: 115 U/L (ref 39–117)
BUN: 11 mg/dL (ref 6–23)
CO2: 33 mEq/L — ABNORMAL HIGH (ref 19–32)
Calcium: 8.8 mg/dL (ref 8.4–10.5)
Chloride: 104 mEq/L (ref 96–112)
Creat: 0.73 mg/dL (ref 0.50–1.10)
Glucose, Bld: 108 mg/dL — ABNORMAL HIGH (ref 70–99)
Potassium: 3.9 mEq/L (ref 3.5–5.3)

## 2011-05-05 NOTE — Telephone Encounter (Signed)
done

## 2011-05-08 ENCOUNTER — Ambulatory Visit (HOSPITAL_COMMUNITY)
Admission: RE | Admit: 2011-05-08 | Discharge: 2011-05-08 | Disposition: A | Discharge status: Home / Self Care | Payer: PRIVATE HEALTH INSURANCE | Source: Ambulatory Visit | Attending: Internal Medicine | Admitting: Internal Medicine

## 2011-05-08 DIAGNOSIS — R0989 Other specified symptoms and signs involving the circulatory and respiratory systems: Secondary | ICD-10-CM | POA: Insufficient documentation

## 2011-05-08 DIAGNOSIS — R0609 Other forms of dyspnea: Secondary | ICD-10-CM | POA: Insufficient documentation

## 2011-05-08 DIAGNOSIS — Z01818 Encounter for other preprocedural examination: Secondary | ICD-10-CM | POA: Insufficient documentation

## 2011-05-08 MED ORDER — ALBUTEROL SULFATE (5 MG/ML) 0.5% IN NEBU
2.5000 mg | INHALATION_SOLUTION | Freq: Once | RESPIRATORY_TRACT | Status: AC
  Administered 2011-05-08: 2.5 mg via RESPIRATORY_TRACT

## 2011-05-14 ENCOUNTER — Other Ambulatory Visit (HOSPITAL_BASED_OUTPATIENT_CLINIC_OR_DEPARTMENT_OTHER): Payer: PRIVATE HEALTH INSURANCE | Admitting: Lab

## 2011-05-14 ENCOUNTER — Ambulatory Visit: Payer: Medicare Other | Admitting: Pharmacist

## 2011-05-14 DIAGNOSIS — I824Y9 Acute embolism and thrombosis of unspecified deep veins of unspecified proximal lower extremity: Secondary | ICD-10-CM

## 2011-05-14 DIAGNOSIS — I2699 Other pulmonary embolism without acute cor pulmonale: Secondary | ICD-10-CM

## 2011-05-14 NOTE — Patient Instructions (Signed)
Continue 4mg  daily. Recheck INR in 2 weeks; 05/28/11 @ 3pm.

## 2011-05-14 NOTE — Progress Notes (Signed)
Continue 4mg  daily. Recheck INR in 2 weeks: 05/28/11 @ 3pm. [facility] has been called and will provide transportation to and from [facility]. Pt will let us know the date of her wrist surgery at least 2 weeks prior.

## 2011-05-26 ENCOUNTER — Other Ambulatory Visit: Payer: Self-pay

## 2011-05-26 DIAGNOSIS — I824Y9 Acute embolism and thrombosis of unspecified deep veins of unspecified proximal lower extremity: Secondary | ICD-10-CM

## 2011-05-26 MED ORDER — WARFARIN SODIUM 4 MG PO TABS: 4.0000 mg | ORAL_TABLET | Freq: Every day | ORAL | Status: DC

## 2011-05-26 NOTE — Telephone Encounter (Signed)
Received refill request form for Coumadin 4mg ; faxed form to [facility] for Coumadin 4mg  daily, per Anola Gurney, Pharm D; patient is aware.

## 2011-05-26 NOTE — Progress Notes (Signed)
Received refill request for Coumadin 4mg ; faxed refill for Comadin 4mg  daily to [facility], per Anola Gurney; patient is aware.

## 2011-05-28 ENCOUNTER — Ambulatory Visit: Payer: 59

## 2011-05-28 ENCOUNTER — Encounter: Payer: Self-pay | Admitting: Pharmacist

## 2011-05-28 ENCOUNTER — Other Ambulatory Visit: Payer: 59 | Admitting: Lab

## 2011-05-28 NOTE — Progress Notes (Signed)
Pt called to cancel her Coumadin clinic appt for this afternoon-- she has GI virus. We moved her next Coumadin clinic appt to 5/15 at 3 pm. Pt states after this appt, she will be able to be scheduled for her wrist surgery w/ Dr. Bradly Bienenstock ([facility]).  Surgery is not yet scheduled.  I have left a msg for Alfonso Ramus, surgery scheduler at [facility]; ph# [phone].  She may help [place] have a better idea of when pt may potentially be scheduled for surgery.  Dr. Glenna Durand protocol is that pt be off Coumadin x 5 days prior to surgery.  Marily Lente, Pharm.D.

## 2011-05-29 NOTE — Progress Notes (Signed)
Faxed completed Medical Clearance form to Dr. Bradly Bienenstock @ [facility] [phone], per Dr. Gaylyn Rong.

## 2011-06-04 ENCOUNTER — Telehealth: Payer: Self-pay | Admitting: *Deleted

## 2011-06-04 ENCOUNTER — Ambulatory Visit (HOSPITAL_BASED_OUTPATIENT_CLINIC_OR_DEPARTMENT_OTHER): Payer: PRIVATE HEALTH INSURANCE | Admitting: Pharmacist

## 2011-06-04 ENCOUNTER — Other Ambulatory Visit: Payer: PRIVATE HEALTH INSURANCE | Admitting: Lab

## 2011-06-04 DIAGNOSIS — I2699 Other pulmonary embolism without acute cor pulmonale: Secondary | ICD-10-CM

## 2011-06-04 DIAGNOSIS — I824Y9 Acute embolism and thrombosis of unspecified deep veins of unspecified proximal lower extremity: Secondary | ICD-10-CM

## 2011-06-04 LAB — POCT INR: INR: 3.5

## 2011-06-04 LAB — PROTIME-INR
INR: 3.5 (ref 2.00–3.50)
Protime: 42 Seconds — ABNORMAL HIGH (ref 10.6–13.4)

## 2011-06-04 NOTE — Telephone Encounter (Signed)
VM from Surgical Scheduler at [name],  States they are waiting for Medical Clearance from pt's PCP prior to scheduling surgery.  After they have that, then they will schedule pt's surgery and will inform us of surgery date.

## 2011-06-04 NOTE — Progress Notes (Signed)
INR above goal today, but pt has been very stable on Coumadin 4mg  daily.  Hold Coumadin x 1 dose, then resume 4mg  daily.  Pt planning surgery on hand with Dr. Orlan Leavens.  Waiting medical clearance by PCP.  Will need to address anticoag needs around surgery when date is set.  Pt aware of this.  Plan to check PT/INR with next MD appt on 06/30/11.

## 2011-06-04 NOTE — Telephone Encounter (Signed)
Form for medical clearance for left wrist surgery received from [facility],  [name], office.  Dr. Gaylyn Rong states needs to know date of surgery to be able to give recommendations for pt's anti coagulation.  I left VM for surgery scheduler, Natasha Mead at ph [phone].  Waiting for call back.

## 2011-06-05 ENCOUNTER — Encounter: Payer: Medicare Other | Admitting: Internal Medicine

## 2011-06-10 ENCOUNTER — Telehealth: Payer: Self-pay | Admitting: *Deleted

## 2011-06-10 NOTE — Telephone Encounter (Signed)
Pt called asking for medical clearance for left hand "dead bone" surgery by Dr Orlan Leavens at [facility]. Natasha Mead is contact person at [employer].  She has seen you for this in past and was sent to several pre-op appointments. She is anxious to have the surgery and not sure what she is waiting for. Pt # [phone]

## 2011-06-11 NOTE — Telephone Encounter (Signed)
Please have the patient be seen in the [facility] next week for this. She will need an hour long appointment as she is very complicated.  She needs the surgery.

## 2011-06-12 NOTE — Telephone Encounter (Signed)
Pt scheduled for 1 hour visit next Tuesday.

## 2011-06-17 ENCOUNTER — Ambulatory Visit (INDEPENDENT_AMBULATORY_CARE_PROVIDER_SITE_OTHER): Payer: PRIVATE HEALTH INSURANCE | Admitting: Internal Medicine

## 2011-06-17 ENCOUNTER — Encounter: Payer: Self-pay | Admitting: Internal Medicine

## 2011-06-17 VITALS — BP 145/66 | HR 63 | Temp 97.2°F | Ht 67.0 in | Wt 337.6 lb

## 2011-06-17 DIAGNOSIS — Z01818 Encounter for other preprocedural examination: Secondary | ICD-10-CM

## 2011-06-17 DIAGNOSIS — Z7901 Long term (current) use of anticoagulants: Secondary | ICD-10-CM

## 2011-06-17 LAB — POCT INR: INR: 5.2

## 2011-06-17 MED ORDER — KETOCONAZOLE 2 % EX CREA: TOPICAL_CREAM | Freq: Every day | CUTANEOUS | Status: DC

## 2011-06-17 NOTE — Progress Notes (Signed)
Subjective:     Patient ID: [name], female   DOB: [birth year], 66 y.o.   MRN: [id number]  HPI The patient is a 66 year old female who comes in today for her preoperative clearance for a carpectomy. She did also come in for this in April and did have a lot of preoperative testing however the paperwork was never filled out and she was not cleared for surgery at that time. She did also wish to go over the results of all this testing today. She states that she's having some stress at home do to her son having anxiety attacks moving in with his father because she's not have other people living with her. He is quite a ways away and is not able to drive long distances if she is not able to see him as much. She is still smoking about half pack per day. I did advise her that she will not continue smoking until the surgery. She was in agreement with this. She states she did quit for 6 weeks for a previous surgery and was able to do that successfully. No other complaints at today's visit. She is having wrist pain where they intend to remove some carpal bones. Did advise her she will likely need rehabilitation after the surgery. Denies shortness of breath, chest pain, nausea, vomiting, diarrhea. Denies dizziness, weakness, headache.  Review of Systems: Please see history of present illness, otherwise negative.  Vitals: Blood pressure: 145/66 Pulse: 60 Temperature: 97.74F Height: 5 feet 7 inches Weight: 337 pounds    Objective:   Physical Exam  Constitutional: She is oriented to person, place, and time. She appears well-developed and well-nourished.       Morbidly obese.  HENT:  Head: Normocephalic and atraumatic.  Eyes: EOM are normal. Pupils are equal, round, and reactive to light.  Neck: Normal range of motion. Neck supple.  Cardiovascular: Normal rate and regular rhythm.   Pulmonary/Chest: Effort normal and breath sounds normal. No respiratory distress.  Abdominal: Soft. Bowel sounds are  normal. She exhibits no distension. There is no tenderness. There is no rebound.       Obese  Musculoskeletal: She exhibits edema and tenderness.  Neurological: She is alert and oriented to person, place, and time. No cranial nerve deficit.  Skin: Skin is warm and dry. Rash noted.       Yeast infection at the intertriginous area of the left groin. Redness with some areas of yeast. Multiple large skin folds in the area.        Assessment/Plan:   1. Preop clearance-patient was medically cleared for a carpectomy of "dead bones". She did have pulmonary function testing done in April, chest x-ray, ECG, CMP, CMP done in April. Did review all of these results with her and they do appear to be in order. She does have some restrictive lung disease which was slightly worse than in 07. Do not think this should prevent her from surgery. She does not have any changes to her ECG or on her x-ray. She did have PT/INR testing done today. Her INR was 5.2. Did advise her to hold Coumadin for tomorrow.She already took her Coumadin today. Did advise her to call orthopedic office for advice on Coumadin. She stated that they would probably schedule the surgery for next week. Did advise her she will likely need 6-8 weeks of rehabilitation after surgery. Did advise her to stop smoking today. Did advise her quitting permanently would be her best option however waiting until 2-3 weeks  after surgery to resume smoking if she doesn't him to resume smoking would be ideal. Did also review her nighttime log of oxygen saturations and she did not go below 90% on oxygen at nighttime.  2. Nicotine addiction-patient was told she will not be allowed to smoke until surgery. Did advise her that quitting long-term would be in her best interest. She stated she would go cold [place] until the surgery.  3. Skin yeast infection - Antifungal cream prescribed for her and indicated importance of keeping the area clean and using the cream BID.   4.  Disposition-patient will be seen back for followup visit when she is able to get around better. Have advised her she is feeling ill or sick or needs advice until that time she is free to call our office at any point and did give her our office number. No change to any of her medications at today's visit with the exception of holding her Coumadin until advised otherwise by the orthopedic doctor's office. I did let her know she is unable to get a hold of them that she should call our office for instructions on Coumadin dosing.

## 2011-06-17 NOTE — Patient Instructions (Signed)
We filled out and faxed your surgery clearance today and verified with their office that it went through. We say that you can have the surgery. NO MORE SMOKING UNTIL 3 WEEKS AFTER THE SURGERY! Please come back and see your regular doctor after the surgery to check on you. Our number is [phone] if there are any problems or you are feeling sick.

## 2011-06-18 ENCOUNTER — Ambulatory Visit (HOSPITAL_BASED_OUTPATIENT_CLINIC_OR_DEPARTMENT_OTHER): Payer: PRIVATE HEALTH INSURANCE | Admitting: Pharmacist

## 2011-06-18 DIAGNOSIS — I824Y9 Acute embolism and thrombosis of unspecified deep veins of unspecified proximal lower extremity: Secondary | ICD-10-CM

## 2011-06-18 NOTE — Progress Notes (Signed)
Call received from Clydie Braun at Dr. Glenna Durand office ([facility]; ph [phone]) stating pts carpectomy has been scheduled for 07/17/11 at 12:30 pm at [facility].  Pt will have 3 day admission for this surgery then will be d/c to SNF.  Dr. Glenna Durand protocol is to stop Coumadin for 5 days prior to surgery. Upon review of pts MR, I discovered pt went to PCP yesterday & INR there = 5.2 (by fingerstick per pt) on Coumadin 4 mg/day.  She had already taken yesterday's dose so they advised pt to hold today's dose of Coumadin & contact ortho for further instructions.  I went ahead & contacted pt by phone since we manage her anticoagulation (not ortho). Pt stated she is asymptomatic re: bleeding sxs.  She does not understand why her INR is elevated but is "scared."   She has not taken extra doses & no meds have been changed/added since her last visit in our clinic.   I advised pt to hold 2 days of Coumadin (5/29 & 5/30) then restart 4 mg/day. Repeat protime in our office 06/24/11. Pt has return appt already sched for 06/30/11 at which time she also will see Dr. Gaylyn Rong. At that visit (6/10) we will need to be prepared w/ a plan for her anticoagulation bridging starting 5 days prior to carpectomy. Will await direction from Dr. Gaylyn Rong. Pt aware of plan & transportation has been set up for 6/4 appt. Marily Lente, Pharm.D.

## 2011-06-18 NOTE — Progress Notes (Signed)
Received call from Clydie Braun, RN @ Dr. Bradly Bienenstock @ [facility] [phone]), informing us that patient's left wrist surgery is scheduled for 07/17/11 @ [facility]; patient's planned hospital stay is 3 days.

## 2011-06-23 ENCOUNTER — Other Ambulatory Visit: Payer: Self-pay | Admitting: Internal Medicine

## 2011-06-23 DIAGNOSIS — Z23 Encounter for immunization: Secondary | ICD-10-CM

## 2011-06-23 NOTE — Progress Notes (Signed)
I received a fax from [facility] stating the patient is interested in a Zostavax prescription. I sent in an electronic copy of the prescription so that the pharmacy could check on price for the patient.

## 2011-06-24 ENCOUNTER — Other Ambulatory Visit (HOSPITAL_BASED_OUTPATIENT_CLINIC_OR_DEPARTMENT_OTHER): Payer: PRIVATE HEALTH INSURANCE | Admitting: Lab

## 2011-06-24 ENCOUNTER — Ambulatory Visit (HOSPITAL_BASED_OUTPATIENT_CLINIC_OR_DEPARTMENT_OTHER): Payer: PRIVATE HEALTH INSURANCE | Admitting: Pharmacist

## 2011-06-24 DIAGNOSIS — I824Y9 Acute embolism and thrombosis of unspecified deep veins of unspecified proximal lower extremity: Secondary | ICD-10-CM

## 2011-06-24 DIAGNOSIS — I2699 Other pulmonary embolism without acute cor pulmonale: Secondary | ICD-10-CM

## 2011-06-24 DIAGNOSIS — Z5181 Encounter for therapeutic drug level monitoring: Secondary | ICD-10-CM

## 2011-06-24 DIAGNOSIS — Z7901 Long term (current) use of anticoagulants: Secondary | ICD-10-CM

## 2011-06-24 LAB — PROTIME-INR: Protime: 31.2 Seconds — ABNORMAL HIGH (ref 10.6–13.4)

## 2011-06-24 NOTE — Progress Notes (Signed)
INR = 2.6 back within range after INR = 5.2 last week.  Pt cannot recall if she held 2 days or 3 days of Coumadin (5/29 & 5/30 was what I discussed w/ her over the phone).  Nonetheless, she has taken 24 mg in the past week. No bleeding or complications related to the elevated INR last week. Pt tells me that her carpectomy surgery has been moved to 07/23/11 w/ [name]. Pt seeing Dr. Gaylyn Rong 6/10.  Will need to discuss plans for bridging at that appt. She already took her Coumadin today so I will have her take 2 mg tomorrow & Friday then 4 mg other days (total = 24 mg this week also). Repeat INR on 6/10 w/ MD visit. Marily Lente, Pharm.D.

## 2011-06-30 ENCOUNTER — Ambulatory Visit: Payer: PRIVATE HEALTH INSURANCE

## 2011-06-30 ENCOUNTER — Ambulatory Visit: Payer: Self-pay | Admitting: Pharmacist

## 2011-06-30 ENCOUNTER — Encounter: Payer: Self-pay | Admitting: Oncology

## 2011-06-30 ENCOUNTER — Telehealth: Payer: Self-pay | Admitting: Oncology

## 2011-06-30 ENCOUNTER — Ambulatory Visit (HOSPITAL_BASED_OUTPATIENT_CLINIC_OR_DEPARTMENT_OTHER): Payer: PRIVATE HEALTH INSURANCE | Admitting: Oncology

## 2011-06-30 ENCOUNTER — Other Ambulatory Visit (HOSPITAL_BASED_OUTPATIENT_CLINIC_OR_DEPARTMENT_OTHER): Payer: PRIVATE HEALTH INSURANCE | Admitting: Lab

## 2011-06-30 VITALS — BP 136/72 | HR 77 | Temp 97.0°F | Ht 67.0 in | Wt 330.0 lb

## 2011-06-30 DIAGNOSIS — M79609 Pain in unspecified limb: Secondary | ICD-10-CM

## 2011-06-30 DIAGNOSIS — I824Y9 Acute embolism and thrombosis of unspecified deep veins of unspecified proximal lower extremity: Secondary | ICD-10-CM

## 2011-06-30 DIAGNOSIS — Z7901 Long term (current) use of anticoagulants: Secondary | ICD-10-CM

## 2011-06-30 DIAGNOSIS — Z86711 Personal history of pulmonary embolism: Secondary | ICD-10-CM

## 2011-06-30 DIAGNOSIS — I2699 Other pulmonary embolism without acute cor pulmonale: Secondary | ICD-10-CM

## 2011-06-30 LAB — COMPREHENSIVE METABOLIC PANEL
ALT: 9 U/L (ref 0–35)
CO2: 30 mEq/L (ref 19–32)
Calcium: 8.5 mg/dL (ref 8.4–10.5)
Chloride: 103 mEq/L (ref 96–112)
Creatinine, Ser: 0.75 mg/dL (ref 0.50–1.10)
Glucose, Bld: 102 mg/dL — ABNORMAL HIGH (ref 70–99)

## 2011-06-30 LAB — CBC WITH DIFFERENTIAL/PLATELET
BASO%: 0.9 % (ref 0.0–2.0)
Basophils Absolute: 0.1 10*3/uL (ref 0.0–0.1)
Eosinophils Absolute: 0.2 10*3/uL (ref 0.0–0.5)
HCT: 33 % — ABNORMAL LOW (ref 34.8–46.6)
HGB: 10.2 g/dL — ABNORMAL LOW (ref 11.6–15.9)
MCHC: 30.9 g/dL — ABNORMAL LOW (ref 31.5–36.0)
MONO#: 0.5 10*3/uL (ref 0.1–0.9)
NEUT#: 4.7 10*3/uL (ref 1.5–6.5)
NEUT%: 72.5 % (ref 38.4–76.8)
Platelets: 325 10*3/uL (ref 145–400)
WBC: 6.5 10*3/uL (ref 3.9–10.3)
lymph#: 1 10*3/uL (ref 0.9–3.3)

## 2011-06-30 LAB — POCT INR: INR: 2.9

## 2011-06-30 LAB — PROTIME-INR

## 2011-06-30 NOTE — Progress Notes (Signed)
[facility]  [phone] [phone]   OFFICE PROGRESS NOTE   Cc:  RAISCH, MICHAEL, MD, MD  DIAGNOSIS:  Extensive LLE DVT, history of PE.   CURRENT THERAPY: Lifelong anticoagulation with goal INR 2-3.  She is currently on Coumadin 4mg  daily except for 2mg  on Wed/Friday.   INTERVAL HISTORY: [name] 66 y.o. female returns for regular follow up by herself.  She reports that she still has LLE swelling and pain.  The pain is both in her calf in addition to her joints worse in the left side compared to the right side.  This in combination with morbid obesity, she has been mainly wheelchair bound.  Her PCP is arranging for her to get a motorized wheelchair in the near future.  She is planning to have left hand surgery with [name] on 07/23/2011.  She has been on Coumadin without complication.  She has fatigue; and needs help from an aid about 4 hours daily.  She still has wet rash in the intertriginous area in her left groin.   She has SOB and DOE which she attributed to her obesity.   Patient denies headache, visual changes, confusion, drenching night sweats, palpable lymph node swelling, mucositis, odynophagia, dysphagia, nausea vomiting, jaundice, chest pain, palpitation,  productive cough, gum bleeding, epistaxis, hematemesis, hemoptysis, abdominal pain, abdominal swelling, early satiety, melena, hematochezia, hematuria, skin rash, spontaneous bleeding, joint swelling, heat or cold intolerance, bowel bladder incontinence, back pain, focal motor weakness, paresthesia.  She reports feeling depressed since her 14 year-old son also has severe anxiety and depression, but he refuses to seek help.    Past Medical History  Diagnosis Date  . COPD (chronic obstructive pulmonary disease)   . Depression     followed by Dr. Gwyndolyn Kaufman, [facility]  . GERD (gastroesophageal reflux disease)   . Hypertension   . Hyperlipidemia   . Basal cell carcinoma 09/2001    followed by  Dr. Karlyn Agee  . Interstitial cystitis     followed by Dr. Isabel Caprice  . Irritable bowel syndrome     colonoscopy 12/2003 and EGD 1997 WNL, followed by Dr. Marina Goodell  . Spinal stenosis     L3-L4, L4-L5 per MRI 12/2004, status post lumbar decompression 02/2007 by Dr. Gerlene Fee  . Pulmonary embolism 10/2007    on coumadin and followed by Dr. Alexandria Lodge  . OSA on CPAP     noncompliance  . S/P mastectomy 1993    bilateral. status post reconstruction in 1994 by Dr. Benna Dunks  . S/P rhinoplasty 1993  . History of back surgery 02/2007    Past Surgical History  Procedure Date  . Cervical discectomy 11/1995 and 05/2002    by Dr. Gerlene Fee  . Abdominal hysterectomy 1976  . Bladder suspension 1993  . Orif ankle fracture 1993  . Lumbar laminectomy/decompression microdiscectomy 02/2007    by Dr. Gerlene Fee    Current Outpatient Prescriptions  Medication Sig Dispense Refill  . ferrous sulfate 325 (65 FE) MG EC tablet Take 325 mg by mouth 3 (three) times daily with meals.      . gabapentin (NEURONTIN) 100 MG capsule Take 100 mg by mouth 3 (three) times daily.        Marland Kitchen HYDROcodone-acetaminophen (NORCO) 10-325 MG per tablet Take 1 tablet by mouth every 8 (eight) hours as needed.      Marland Kitchen ketoconazole (NIZORAL) 2 % cream Apply topically daily.  15 g  0  . lamoTRIgine (LAMICTAL) 100 MG tablet Take 100  mg by mouth daily.      Marland Kitchen LORazepam (ATIVAN) 1 MG tablet Take 1-2 mg by mouth 2 (two) times daily. Take one tablet in morning and two tablets by mouth at bedtime      . omeprazole (PRILOSEC) 20 MG capsule Take 20 mg by mouth daily.      . pravastatin (PRAVACHOL) 40 MG tablet Take 40 mg by mouth daily.      . QUEtiapine (SEROQUEL) 200 MG tablet Take 400 mg by mouth at bedtime.        . sertraline (ZOLOFT) 100 MG tablet Take 200 mg by mouth daily.        Marland Kitchen warfarin (COUMADIN) 4 MG tablet Take 1 tablet (4 mg total) by mouth daily.  30 tablet  1    ALLERGIES:  is allergic to chlorpromazine hcl; codeine; morphine; and  sulfonamide derivatives.  REVIEW OF SYSTEMS:  The rest of the 14-point review of system was negative.   Filed Vitals:   06/30/11 1213  BP: 136/72  Pulse: 77  Temp: 97 F (36.1 C)   Wt Readings from Last 3 Encounters:  06/30/11 330 lb (149.687 kg)  06/17/11 337 lb 9.6 oz (153.134 kg)  06/24/10 332 lb 12.8 oz (150.957 kg)   ECOG Performance status: 2  PHYSICAL EXAMINATION:   General: Morbidly obese woman in no acute distress. Eyes: no scleral icterus. ENT: There were no oropharyngeal lesions. Neck was without thyromegaly. Lymphatics: Negative cervical, supraclavicular or axillary adenopathy. Respiratory: lungs were clear bilaterally without wheezing or crackles. Cardiovascular: Regular rate and rhythm, S1/S2, without murmur, rub or gallop. There was about 1+ nonpitting pedal edema in the left leg.  GI: abdomen was obese but nontender, nondistended, without organomegaly. Muscoloskeletal: no spinal tenderness of palpation of vertebral spine. Skin exam was without echymosis, petichae. She has erythema, wet, slightly fowl odor in the left groin.  CN II-XII were grossly intact.  Patient could not get on the exam table. She was wheelchair bound; gait could not be assessed. Patient was alerted and oriented. Attention was good. Language was appropriate. Mood was depressed; and she was teary. Speech was not pressured. Thought content was not tangential.    LABORATORY/RADIOLOGY DATA:  Lab Results  Component Value Date   WBC 6.5 06/30/2011   HGB 10.2* 06/30/2011   HCT 33.0* 06/30/2011   PLT 325 06/30/2011   GLUCOSE 102* 06/30/2011   CHOL 170 05/03/2009   TRIG 166* 05/03/2009   HDL 41 05/03/2009   LDLCALC 96 05/03/2009   ALKPHOS 104 06/30/2011   ALT 9 06/30/2011   AST 13 06/30/2011   NA 141 06/30/2011   K 3.5 06/30/2011   CL 103 06/30/2011   CREATININE 0.75 06/30/2011   BUN 10 06/30/2011   CO2 30 06/30/2011   INR 2.90 06/30/2011   HGBA1C  Value: 6.2 (NOTE) The ADA recommends the following therapeutic  goal for glycemic control related to Hgb A1c measurement: Goal of therapy: <6.5 Hgb A1c  Reference: American Diabetes Association: Clinical Practice Recommendations 2010, Diabetes Care, 2010, 33: (Suppl  1).* 11/29/2008   MICROALBUR 1.23 08/25/2006     ASSESSMENT AND PLAN:    1. Morbid obesity:  I defer to PCP whether she is a candidate for gastric bypass.  2. Osteoarthritis, with decreased mobility secondary to osteoarthritis of lower back and bilateral knees. She is under the care of [name].  3. Hyperlipidemia: On pravastatin with PCP.  4. Hypertension: off of meds per PCP.  5. History of gastric  antral vascular ectasia: she was treated in the past by Dr. Elnoria Howard. She has no evidence of active bleeding now. She is on chronic oral iron.  6. History of smoking with chronic obstructive pulmonary disease (COPD):   Despite multiple attempts, she still smokes now.  I advised her to stop smoking now due to impending surgery.  7. History of pulmonary embolism (PE) on the right, subsegmental, pulmonary embolism in 2009 with diagnosis of left lower extremity extensive deep venous thrombosis (DVT) in May 2012.  She has high risk features with morbid obesity and limited mobility and history of recurrent thrombosis. She would benefit from lifelong anti coagulation since she doesn't have active bleeding. She has tolerate Coumadin since May 2012 without major problem. [facility]2011 to instruct her how to bridge to Lovenox 5 days before left hand surgery on 07/23/2011.  The day after surgery, on 07/24/2011, if she is hemostatically controlled, then she should be bridged back to Coumadin and Lovenox.  Please have patient contact us upon discharge to manage her post op Coumadin dose.  8. Left leg post thrombotic pain/edema:  I wrote her a Rx for compression stocking.  9. Depression, anxiety:  He is on Zoloft and Seroquel per her psychiatrist.   10. Follow up:   Coumadin clinic at [facility]; and return with MD in about 6 months.      The length of time of the face-to-face encounter was 15 minutes. More than 50% of time was spent counseling and coordination of care.

## 2011-06-30 NOTE — Telephone Encounter (Signed)
Gave pt appt fotr June and December 2013 lab and MD

## 2011-06-30 NOTE — Progress Notes (Signed)
INR = 2.9  Pt seen by MD today & didn't need to be seen in the Coumadin clinic. Dose of Coumadin will be left at 4 mg/day; 2 mg Wed/Fri. Return 07/14/11.  At that visit, we will need to let pt know to take her last dose of Coumadin on 07/17/11 (stop Coumadin 5 days before carpectomy; scheduled for 07/23/11). She will be inpatient following surgery & planned to be discharged to SNF.  We can f/u after surgery to determine when she will return to our Coumadin clinic. Marily Lente, Pharm.D.

## 2011-07-03 ENCOUNTER — Telehealth: Payer: Self-pay | Admitting: Pharmacist

## 2011-07-03 ENCOUNTER — Encounter (HOSPITAL_COMMUNITY): Payer: Self-pay | Admitting: Pharmacy Technician

## 2011-07-11 ENCOUNTER — Encounter: Payer: Self-pay | Admitting: Pharmacist

## 2011-07-11 ENCOUNTER — Inpatient Hospital Stay (HOSPITAL_COMMUNITY): Admission: RE | Admit: 2011-07-11 | Discharge: 2011-07-11 | Payer: PRIVATE HEALTH INSURANCE | Source: Ambulatory Visit

## 2011-07-11 ENCOUNTER — Telehealth: Payer: Self-pay | Admitting: Pharmacist

## 2011-07-11 ENCOUNTER — Encounter (HOSPITAL_COMMUNITY): Payer: Self-pay

## 2011-07-11 NOTE — Progress Notes (Signed)
Bridging Plan around Carpectomy (approved by Dr. Gaylyn Rong): She will take her last dose of Coumadin on 6/27.  Come in on July 1st & check INR. If INR <2, give her Lovenox 1 mg/kg x 1 dose  Hold all anticoag 24 hrs before procedure.  Resume Coumadin PM of carpectomy (as Inpatient)  Resume Lovenox 24 hrs post-op (as Inpatient). We can f/u after discharged from SNF, if that continues to be plan. Marily Lente, Pharm.D.

## 2011-07-11 NOTE — Pre-Procedure Instructions (Signed)
20 VRINDA HECKSTALL  07/11/2011   Your procedure is scheduled on:  July 23, 2011  Report to [facility]   CALL [phone] DAY OF SURGERY @ 8AM FOR ARRIVAL TIME  Call this number if you have problems the morning of surgery: [phone]   Remember:   Do not eat food:After Midnight.    Take these medicines the morning of surgery with A SIP OF WATER: PAIN PILL, ATIVAN,PRILOSEC, ZOLOFT        STOP COUMADIN AS DIRECTED   Do not wear jewelry, make-up or nail polish.  Do not wear lotions, powders, or perfumes. You may wear deodorant.  Do not shave 48 hours prior to surgery. Men may shave face and neck.  Do not bring valuables to the hospital.  Contacts, dentures or bridgework may not be worn into surgery.  Leave suitcase in the car. After surgery it may be brought to your room.  For patients admitted to the hospital, checkout time is 11:00 AM the day of discharge.   Patients discharged the day of surgery will not be allowed to drive home.  Name and phone number of your driver:   Special Instructions: CHG Shower Use Special Wash: 1/2 bottle night before surgery and 1/2 bottle morning of surgery.   Please read over the following fact sheets that you were given: Pain Booklet, Coughing and Deep Breathing and Surgical Site Infection Prevention

## 2011-07-14 ENCOUNTER — Ambulatory Visit (HOSPITAL_BASED_OUTPATIENT_CLINIC_OR_DEPARTMENT_OTHER): Payer: PRIVATE HEALTH INSURANCE | Admitting: Pharmacist

## 2011-07-14 ENCOUNTER — Other Ambulatory Visit (HOSPITAL_BASED_OUTPATIENT_CLINIC_OR_DEPARTMENT_OTHER): Payer: PRIVATE HEALTH INSURANCE | Admitting: Lab

## 2011-07-14 DIAGNOSIS — I824Y9 Acute embolism and thrombosis of unspecified deep veins of unspecified proximal lower extremity: Secondary | ICD-10-CM

## 2011-07-14 DIAGNOSIS — Z7901 Long term (current) use of anticoagulants: Secondary | ICD-10-CM

## 2011-07-14 LAB — PROTIME-INR

## 2011-07-14 LAB — CBC WITH DIFFERENTIAL/PLATELET
BASO%: 0.3 % (ref 0.0–2.0)
Basophils Absolute: 0 10*3/uL (ref 0.0–0.1)
EOS%: 2.9 % (ref 0.0–7.0)
HGB: 9.7 g/dL — ABNORMAL LOW (ref 11.6–15.9)
MCH: 22.9 pg — ABNORMAL LOW (ref 25.1–34.0)
MCHC: 29.7 g/dL — ABNORMAL LOW (ref 31.5–36.0)
RDW: 18 % — ABNORMAL HIGH (ref 11.2–14.5)
WBC: 6.2 10*3/uL (ref 3.9–10.3)
lymph#: 1.3 10*3/uL (ref 0.9–3.3)

## 2011-07-14 NOTE — Patient Instructions (Addendum)
You already took your Coumadin (4mg ) today.  Hold Coumadin 6/25 and 6/26. Take 4mg  on 6/27.  Then hold Coumadin 6/28 - 7/3. Surgery planned 7/3.  Repeat INR on 7/1 here a [facility]. You may need a dose of Lovenox that day.

## 2011-07-14 NOTE — Progress Notes (Signed)
Pt took Coumadin already today.  She will hold Coumadin 6/25 and 6/26.  She will take 4mg  on 6/27.  Then she will hold Coumadin 6/28 - 7/3. Surgery planned 7/3.  Repeat INR on 7/1 (see Epic Documentation note from 6/21- she may need Lovenox that day if INR <2).

## 2011-07-15 ENCOUNTER — Encounter (HOSPITAL_COMMUNITY)
Admission: RE | Admit: 2011-07-15 | Discharge: 2011-07-15 | Disposition: A | Discharge status: Home / Self Care | Payer: PRIVATE HEALTH INSURANCE | Source: Ambulatory Visit | Attending: Orthopedic Surgery | Admitting: Orthopedic Surgery

## 2011-07-15 LAB — BASIC METABOLIC PANEL
BUN: 13 mg/dL (ref 6–23)
Creatinine, Ser: 0.72 mg/dL (ref 0.50–1.10)
GFR calc Af Amer: >90 mL/min (ref >90)
GFR calc non Af Amer: 88 mL/min — ABNORMAL LOW (ref >90)
Potassium: 3.4 mEq/L — ABNORMAL LOW (ref 3.5–5.1)

## 2011-07-15 LAB — CBC
Hemoglobin: 9.5 g/dL — ABNORMAL LOW (ref 12.0–15.0)
MCHC: 29.1 g/dL — ABNORMAL LOW (ref 30.0–36.0)
RDW: 18 % — ABNORMAL HIGH (ref 11.5–15.5)
WBC: 6.4 10*3/uL (ref 4.0–10.5)

## 2011-07-15 LAB — APTT: aPTT: 60 seconds — ABNORMAL HIGH (ref 24–37)

## 2011-07-15 LAB — SURGICAL PCR SCREEN
MRSA, PCR: POSITIVE — AB
Staphylococcus aureus: POSITIVE — AB

## 2011-07-15 NOTE — Progress Notes (Signed)
Pt states will take last dose coumadin on 6/27.

## 2011-07-15 NOTE — Progress Notes (Signed)
LM for Clydie Braun in Dr. Glenna Durand office requesting orders for surgery.

## 2011-07-16 ENCOUNTER — Other Ambulatory Visit: Payer: Self-pay

## 2011-07-16 DIAGNOSIS — I82409 Acute embolism and thrombosis of unspecified deep veins of unspecified lower extremity: Secondary | ICD-10-CM

## 2011-07-16 MED ORDER — WARFARIN SODIUM 4 MG PO TABS: 4.0000 mg | ORAL_TABLET | ORAL | Status: DC

## 2011-07-16 NOTE — Consult Note (Signed)
Anesthesia Chart Review:  Patient is a 66 year old female posted for left carpectomy on 07/23/11 by [name].  Orders are currently pending.    History includes recent former smoker, post-operative N/V, morbid obesity with BMI 51.7, COPD, depression, GERD, HTN, HLD, interstitial cystitis, OSA on CPAP and night time O2, PE '09, DVT 05/2010, prior cervical and lumbar spine surgeries, rhinoplasty '93, basal cell carcinoma.  Dr. Genella Mech who saw her on 06/18/11 for pre-operative clearance.  Dr. Gaylyn Rong is her Hematologist who has recommended life long anticoagulation due to her history of PE, DVT, morbid obesity, and inactivity.  He is also aware of planned procedure.  Labs noted.  H/H 9.5/32.6 (Hgb previously 10.2 and 9.7 earlier this month). INR was 4.61 on 07/14/11.  PTT was 60 on 07/15/11.  Her INR was already noted by the PharmD managing her Coumadin dosing.  Notes indicate she will have a repeat PT/INR on 07/21/11 with consideration of Lovenox bridge if her INR is < 2.  I called her PT/INR, PTT, and Hgb results to Clydie Braun at [employer].  I will order a repeat PT/PTT for the day of surgery.    CXR on 05/01/11 showed: Mild cardiomegaly and vascular congestion. Peribronchial  thickening and mild interstitial prominence, similar prior study. No confluent opacities or effusions. No acute bony abnormality.   PFTs findings on 05/15/11 were consistent with restrictive lung disease, no significant response to bronchodilator.  Pre-FVC 2.17 (66%) and showed FEV1 1.81 (73%).  Full report is under Media tab.  EKG on 05/01/11 showed NSR.    Echo on 12/01/08 showed: 1. Left ventricle: The cavity size was normal. Wall thickness was increased in a pattern of mild LVH. Systolic function was normal. The estimated ejection fraction was in the range of 55% to 60%. Wall motion was normal; there were no regional wall motion abnormalities. Left ventricular diastolic function parameters were normal. 2. Aortic valve: There  was no stenosis. 3. Mitral valve: Trivial regurgitation. 4. Left atrium: The atrium was mildly dilated. 5. Right ventricle: The cavity size was normal. Systolic function was normal. 6. Right atrium: The atrium was mildly dilated. 7. Pulmonary arteries: No significant TR jet so unable to estimate PA systolic pressure. 8. Systemic veins: IVC measures 2.1 cm with normal respirophasic variation, suggesting RA pressure 6-10 mmHg.  Anticipate she can proceed if her follow-up coags are reasonable.  Shonna Chock, PA-C

## 2011-07-17 NOTE — Progress Notes (Signed)
LM Clydie Braun AT Dr. Glenna Durand orders needed.

## 2011-07-18 NOTE — Progress Notes (Signed)
Spoke with Marchelle Folks at Dr Bari Edward office and informed her that orders need to be entered for pt.  She sent a message to Dr Orlan Leavens as we were speaking.

## 2011-07-21 ENCOUNTER — Other Ambulatory Visit: Payer: PRIVATE HEALTH INSURANCE

## 2011-07-21 ENCOUNTER — Ambulatory Visit: Payer: PRIVATE HEALTH INSURANCE

## 2011-07-21 NOTE — Progress Notes (Signed)
Requested  ORDERS FROM DR [facility] OFFICE.

## 2011-07-22 ENCOUNTER — Other Ambulatory Visit (HOSPITAL_BASED_OUTPATIENT_CLINIC_OR_DEPARTMENT_OTHER): Payer: PRIVATE HEALTH INSURANCE | Admitting: Lab

## 2011-07-22 ENCOUNTER — Ambulatory Visit (HOSPITAL_BASED_OUTPATIENT_CLINIC_OR_DEPARTMENT_OTHER): Payer: PRIVATE HEALTH INSURANCE | Admitting: Pharmacist

## 2011-07-22 DIAGNOSIS — I824Y9 Acute embolism and thrombosis of unspecified deep veins of unspecified proximal lower extremity: Secondary | ICD-10-CM

## 2011-07-22 DIAGNOSIS — I2699 Other pulmonary embolism without acute cor pulmonale: Secondary | ICD-10-CM

## 2011-07-22 LAB — PROTIME-INR: Protime: 26.4 Seconds — ABNORMAL HIGH (ref 10.6–13.4)

## 2011-07-22 NOTE — Progress Notes (Signed)
Spoke with Candy at Dr Glenna Durand office requesting surgical orders.  She will leave Dr Melvyn Novas a message again

## 2011-07-22 NOTE — Progress Notes (Signed)
INR = 2.2 today.  Pt has not had a dose of Coumadin since 6/27, as instructed. Surgery (carpectomy) is set for tomorrow. I gave pt a copy of her INR. She will continue to hold all Coumadin doses.  She was not given Lovenox today as her INR >2. Return to our Coumadin clinic upon discharge from SNF.   Marily Lente, Pharm.D.

## 2011-07-22 NOTE — Progress Notes (Signed)
Spoke with pt and informed her to be here at 1030 am.

## 2011-07-23 ENCOUNTER — Encounter (HOSPITAL_COMMUNITY): Payer: Self-pay | Admitting: Vascular Surgery

## 2011-07-23 ENCOUNTER — Ambulatory Visit (HOSPITAL_COMMUNITY): Payer: PRIVATE HEALTH INSURANCE | Admitting: Vascular Surgery

## 2011-07-23 ENCOUNTER — Encounter (HOSPITAL_COMMUNITY)
Admission: RE | Disposition: A | Discharge status: Skilled Nursing Facility | Payer: Self-pay | Source: Ambulatory Visit | Attending: Orthopedic Surgery

## 2011-07-23 ENCOUNTER — Inpatient Hospital Stay (HOSPITAL_COMMUNITY)
Admission: RE | Admit: 2011-07-23 | Discharge: 2011-07-28 | DRG: 501 | Disposition: A | Discharge status: Skilled Nursing Facility | Payer: PRIVATE HEALTH INSURANCE | Source: Ambulatory Visit | Attending: Orthopedic Surgery | Admitting: Orthopedic Surgery

## 2011-07-23 DIAGNOSIS — J449 Chronic obstructive pulmonary disease, unspecified: Secondary | ICD-10-CM | POA: Diagnosis present

## 2011-07-23 DIAGNOSIS — F329 Major depressive disorder, single episode, unspecified: Secondary | ICD-10-CM | POA: Diagnosis present

## 2011-07-23 DIAGNOSIS — Z7901 Long term (current) use of anticoagulants: Secondary | ICD-10-CM

## 2011-07-23 DIAGNOSIS — K589 Irritable bowel syndrome without diarrhea: Secondary | ICD-10-CM | POA: Diagnosis present

## 2011-07-23 DIAGNOSIS — Z85828 Personal history of other malignant neoplasm of skin: Secondary | ICD-10-CM

## 2011-07-23 DIAGNOSIS — M9380 Other specified osteochondropathies of unspecified site: Principal | ICD-10-CM | POA: Diagnosis present

## 2011-07-23 DIAGNOSIS — M8708 Idiopathic aseptic necrosis of bone, other site: Secondary | ICD-10-CM | POA: Diagnosis present

## 2011-07-23 DIAGNOSIS — E785 Hyperlipidemia, unspecified: Secondary | ICD-10-CM | POA: Diagnosis present

## 2011-07-23 DIAGNOSIS — F3289 Other specified depressive episodes: Secondary | ICD-10-CM | POA: Diagnosis present

## 2011-07-23 DIAGNOSIS — F411 Generalized anxiety disorder: Secondary | ICD-10-CM | POA: Diagnosis present

## 2011-07-23 DIAGNOSIS — Z79899 Other long term (current) drug therapy: Secondary | ICD-10-CM

## 2011-07-23 DIAGNOSIS — J4489 Other specified chronic obstructive pulmonary disease: Secondary | ICD-10-CM | POA: Diagnosis present

## 2011-07-23 DIAGNOSIS — Z01812 Encounter for preprocedural laboratory examination: Secondary | ICD-10-CM

## 2011-07-23 DIAGNOSIS — G4733 Obstructive sleep apnea (adult) (pediatric): Secondary | ICD-10-CM | POA: Diagnosis present

## 2011-07-23 DIAGNOSIS — Z87891 Personal history of nicotine dependence: Secondary | ICD-10-CM

## 2011-07-23 DIAGNOSIS — Z6841 Body Mass Index (BMI) 40.0 and over, adult: Secondary | ICD-10-CM

## 2011-07-23 DIAGNOSIS — I82409 Acute embolism and thrombosis of unspecified deep veins of unspecified lower extremity: Secondary | ICD-10-CM

## 2011-07-23 DIAGNOSIS — I10 Essential (primary) hypertension: Secondary | ICD-10-CM | POA: Diagnosis present

## 2011-07-23 DIAGNOSIS — K219 Gastro-esophageal reflux disease without esophagitis: Secondary | ICD-10-CM | POA: Diagnosis present

## 2011-07-23 DIAGNOSIS — Z86711 Personal history of pulmonary embolism: Secondary | ICD-10-CM

## 2011-07-23 HISTORY — DX: Malignant neoplasm of unspecified site of unspecified female breast: C50.919

## 2011-07-23 HISTORY — PX: CARPECTOMY: SHX5004

## 2011-07-23 HISTORY — DX: Anxiety disorder, unspecified: F41.9

## 2011-07-23 LAB — PROTIME-INR: Prothrombin Time: 20 seconds — ABNORMAL HIGH (ref 11.6–15.2)

## 2011-07-23 LAB — APTT: aPTT: 32 seconds (ref 24–37)

## 2011-07-23 SURGERY — CARPECTOMY
Anesthesia: General | Site: Wrist | Laterality: Left | Wound class: Clean

## 2011-07-23 MED ORDER — ONDANSETRON HCL 4 MG/2ML IJ SOLN
INTRAMUSCULAR | Status: DC | PRN
  Administered 2011-07-23: 4 mg via INTRAVENOUS

## 2011-07-23 MED ORDER — NEOSTIGMINE METHYLSULFATE 1 MG/ML IJ SOLN
INTRAMUSCULAR | Status: DC | PRN
  Administered 2011-07-23: 3 mg via INTRAVENOUS

## 2011-07-23 MED ORDER — SIMVASTATIN 20 MG PO TABS
20.0000 mg | ORAL_TABLET | Freq: Every day | ORAL | Status: DC
  Administered 2011-07-23 – 2011-07-27 (×5): 20 mg via ORAL
  Filled 2011-07-23 (×6): qty 1

## 2011-07-23 MED ORDER — FENTANYL CITRATE 0.05 MG/ML IJ SOLN
INTRAMUSCULAR | Status: DC | PRN
  Administered 2011-07-23 (×2): 50 ug via INTRAVENOUS
  Administered 2011-07-23: 100 ug via INTRAVENOUS

## 2011-07-23 MED ORDER — WARFARIN - PHARMACIST DOSING INPATIENT
Freq: Every day | Status: DC
  Administered 2011-07-25: 18:00:00

## 2011-07-23 MED ORDER — HYDROMORPHONE HCL PF 1 MG/ML IJ SOLN
0.5000 mg | INTRAMUSCULAR | Status: DC | PRN
  Administered 2011-07-23: 1 mg via INTRAVENOUS
  Filled 2011-07-23: qty 1

## 2011-07-23 MED ORDER — WARFARIN SODIUM 4 MG PO TABS: 4.0000 mg | ORAL_TABLET | ORAL | Status: DC

## 2011-07-23 MED ORDER — GABAPENTIN 100 MG PO CAPS
100.0000 mg | ORAL_CAPSULE | Freq: Three times a day (TID) | ORAL | Status: DC
  Administered 2011-07-23 – 2011-07-28 (×14): 100 mg via ORAL
  Filled 2011-07-23 (×16): qty 1

## 2011-07-23 MED ORDER — PANTOPRAZOLE SODIUM 40 MG PO TBEC
40.0000 mg | DELAYED_RELEASE_TABLET | Freq: Every day | ORAL | Status: DC
  Administered 2011-07-23 – 2011-07-28 (×6): 40 mg via ORAL
  Filled 2011-07-23 (×5): qty 1

## 2011-07-23 MED ORDER — LACTATED RINGERS IV SOLN
INTRAVENOUS | Status: DC
  Administered 2011-07-23: 13:00:00 via INTRAVENOUS

## 2011-07-23 MED ORDER — CEFAZOLIN SODIUM 1-5 GM-% IV SOLN
INTRAVENOUS | Status: DC | PRN
  Administered 2011-07-23: 3 g via INTRAVENOUS

## 2011-07-23 MED ORDER — ONDANSETRON HCL 4 MG/2ML IJ SOLN: 4.0000 mg | Freq: Four times a day (QID) | INTRAMUSCULAR | Status: DC | PRN

## 2011-07-23 MED ORDER — LACTATED RINGERS IV SOLN
INTRAVENOUS | Status: DC | PRN
  Administered 2011-07-23 (×2): via INTRAVENOUS

## 2011-07-23 MED ORDER — ROCURONIUM BROMIDE 100 MG/10ML IV SOLN
INTRAVENOUS | Status: DC | PRN
  Administered 2011-07-23: 50 mg via INTRAVENOUS

## 2011-07-23 MED ORDER — SERTRALINE HCL 100 MG PO TABS
200.0000 mg | ORAL_TABLET | Freq: Every day | ORAL | Status: DC
  Administered 2011-07-24 – 2011-07-28 (×5): 200 mg via ORAL
  Filled 2011-07-23 (×5): qty 2

## 2011-07-23 MED ORDER — WARFARIN SODIUM 4 MG PO TABS
4.0000 mg | ORAL_TABLET | Freq: Once | ORAL | Status: AC
  Administered 2011-07-23: 4 mg via ORAL
  Filled 2011-07-23: qty 1

## 2011-07-23 MED ORDER — DOCUSATE SODIUM 100 MG PO CAPS
100.0000 mg | ORAL_CAPSULE | Freq: Two times a day (BID) | ORAL | Status: DC
  Administered 2011-07-23 – 2011-07-28 (×10): 100 mg via ORAL
  Filled 2011-07-23 (×11): qty 1

## 2011-07-23 MED ORDER — ADULT MULTIVITAMIN W/MINERALS CH
1.0000 | ORAL_TABLET | Freq: Every day | ORAL | Status: DC
  Administered 2011-07-23 – 2011-07-28 (×6): 1 via ORAL
  Filled 2011-07-23 (×7): qty 1

## 2011-07-23 MED ORDER — QUETIAPINE FUMARATE 400 MG PO TABS
400.0000 mg | ORAL_TABLET | Freq: Every day | ORAL | Status: DC
  Administered 2011-07-23 – 2011-07-27 (×5): 400 mg via ORAL
  Filled 2011-07-23 (×7): qty 1

## 2011-07-23 MED ORDER — VITAMIN C 500 MG PO TABS
1000.0000 mg | ORAL_TABLET | Freq: Every day | ORAL | Status: DC
  Administered 2011-07-23 – 2011-07-28 (×6): 1000 mg via ORAL
  Filled 2011-07-23 (×6): qty 2

## 2011-07-23 MED ORDER — LAMOTRIGINE 100 MG PO TABS
100.0000 mg | ORAL_TABLET | Freq: Every day | ORAL | Status: DC
  Administered 2011-07-23 – 2011-07-28 (×6): 100 mg via ORAL
  Filled 2011-07-23 (×6): qty 1

## 2011-07-23 MED ORDER — METHOCARBAMOL 500 MG PO TABS
500.0000 mg | ORAL_TABLET | Freq: Four times a day (QID) | ORAL | Status: DC | PRN
  Administered 2011-07-24 – 2011-07-27 (×5): 500 mg via ORAL
  Filled 2011-07-23 (×5): qty 1

## 2011-07-23 MED ORDER — CHLORHEXIDINE GLUCONATE 4 % EX LIQD: 60.0000 mL | Freq: Once | CUTANEOUS | Status: DC

## 2011-07-23 MED ORDER — HYDROMORPHONE HCL PF 1 MG/ML IJ SOLN
0.2500 mg | INTRAMUSCULAR | Status: DC | PRN
  Administered 2011-07-23 (×4): 0.5 mg via INTRAVENOUS

## 2011-07-23 MED ORDER — OXYCODONE HCL 5 MG PO TABS
5.0000 mg | ORAL_TABLET | ORAL | Status: DC | PRN
  Administered 2011-07-24: 10 mg via ORAL
  Administered 2011-07-26 (×2): 5 mg via ORAL
  Administered 2011-07-27 – 2011-07-28 (×3): 10 mg via ORAL
  Filled 2011-07-23: qty 2
  Filled 2011-07-23 (×2): qty 1
  Filled 2011-07-23 (×3): qty 2

## 2011-07-23 MED ORDER — FERROUS SULFATE 325 (65 FE) MG PO TABS
325.0000 mg | ORAL_TABLET | Freq: Three times a day (TID) | ORAL | Status: DC
  Administered 2011-07-24 – 2011-07-28 (×12): 325 mg via ORAL
  Filled 2011-07-23 (×16): qty 1

## 2011-07-23 MED ORDER — BUPIVACAINE HCL (PF) 0.25 % IJ SOLN
INTRAMUSCULAR | Status: DC | PRN
  Administered 2011-07-23: 9 mL via INTRA_ARTICULAR

## 2011-07-23 MED ORDER — CEFAZOLIN SODIUM 1-5 GM-% IV SOLN
INTRAVENOUS | Status: AC
  Filled 2011-07-23: qty 150

## 2011-07-23 MED ORDER — DIPHENHYDRAMINE HCL 25 MG PO CAPS
25.0000 mg | ORAL_CAPSULE | Freq: Four times a day (QID) | ORAL | Status: DC | PRN
  Administered 2011-07-24 – 2011-07-28 (×4): 25 mg via ORAL
  Filled 2011-07-23 (×4): qty 1

## 2011-07-23 MED ORDER — LORAZEPAM 1 MG PO TABS
1.0000 mg | ORAL_TABLET | Freq: Two times a day (BID) | ORAL | Status: DC
  Administered 2011-07-23 – 2011-07-24 (×3): 2 mg via ORAL
  Administered 2011-07-25 – 2011-07-27 (×4): 1 mg via ORAL
  Administered 2011-07-27: 2 mg via ORAL
  Administered 2011-07-28: 1 mg via ORAL
  Filled 2011-07-23: qty 2
  Filled 2011-07-23 (×5): qty 1
  Filled 2011-07-23 (×3): qty 2

## 2011-07-23 MED ORDER — HYDROCODONE-ACETAMINOPHEN 10-325 MG PO TABS
1.0000 | ORAL_TABLET | Freq: Three times a day (TID) | ORAL | Status: DC | PRN
  Filled 2011-07-23 (×2): qty 1

## 2011-07-23 MED ORDER — METHOCARBAMOL 100 MG/ML IJ SOLN
500.0000 mg | Freq: Four times a day (QID) | INTRAVENOUS | Status: DC | PRN
  Filled 2011-07-23: qty 5

## 2011-07-23 MED ORDER — HYDROMORPHONE HCL PF 1 MG/ML IJ SOLN
INTRAMUSCULAR | Status: AC
  Administered 2011-07-23: 0.5 mg via INTRAVENOUS
  Filled 2011-07-23: qty 1

## 2011-07-23 MED ORDER — LIDOCAINE HCL (CARDIAC) 20 MG/ML IV SOLN
INTRAVENOUS | Status: DC | PRN
  Administered 2011-07-23: 100 mg via INTRAVENOUS

## 2011-07-23 MED ORDER — HYDROCODONE-ACETAMINOPHEN 10-325 MG PO TABS
1.0000 | ORAL_TABLET | ORAL | Status: DC | PRN
  Administered 2011-07-23: 1 via ORAL
  Administered 2011-07-24 (×3): 2 via ORAL
  Administered 2011-07-26 – 2011-07-27 (×3): 1 via ORAL
  Filled 2011-07-23: qty 2
  Filled 2011-07-23: qty 1
  Filled 2011-07-23 (×2): qty 2
  Filled 2011-07-23: qty 1

## 2011-07-23 MED ORDER — GLYCOPYRROLATE 0.2 MG/ML IJ SOLN
INTRAMUSCULAR | Status: DC | PRN
  Administered 2011-07-23: 0.4 mg via INTRAVENOUS

## 2011-07-23 MED ORDER — DEXTROSE 5 % IV SOLN
3.0000 g | INTRAVENOUS | Status: DC
  Filled 2011-07-23: qty 30

## 2011-07-23 MED ORDER — ONDANSETRON HCL 4 MG PO TABS
4.0000 mg | ORAL_TABLET | Freq: Four times a day (QID) | ORAL | Status: DC | PRN
  Administered 2011-07-27: 4 mg via ORAL
  Filled 2011-07-23: qty 1

## 2011-07-23 MED ORDER — HYDROMORPHONE HCL PF 1 MG/ML IJ SOLN
INTRAMUSCULAR | Status: AC
  Filled 2011-07-23: qty 1

## 2011-07-23 MED ORDER — VANCOMYCIN HCL 1000 MG IV SOLR
1500.0000 mg | Freq: Two times a day (BID) | INTRAVENOUS | Status: AC
  Administered 2011-07-23 – 2011-07-25 (×4): 1500 mg via INTRAVENOUS
  Filled 2011-07-23 (×4): qty 1500

## 2011-07-23 MED ORDER — PROPOFOL 10 MG/ML IV EMUL
INTRAVENOUS | Status: DC | PRN
  Administered 2011-07-23: 200 mg via INTRAVENOUS

## 2011-07-23 MED ORDER — LACTATED RINGERS IV SOLN: INTRAVENOUS | Status: DC

## 2011-07-23 MED ORDER — FERROUS SULFATE 325 (65 FE) MG PO TBEC: 325.0000 mg | DELAYED_RELEASE_TABLET | Freq: Three times a day (TID) | ORAL | Status: DC

## 2011-07-23 MED ORDER — 0.9 % SODIUM CHLORIDE (POUR BTL) OPTIME
TOPICAL | Status: DC | PRN
  Administered 2011-07-23: 1000 mL

## 2011-07-23 SURGICAL SUPPLY — 39 items
BANDAGE ELASTIC 3 VELCRO ST LF (GAUZE/BANDAGES/DRESSINGS) ×2 IMPLANT
BANDAGE ELASTIC 4 VELCRO ST LF (GAUZE/BANDAGES/DRESSINGS) ×2 IMPLANT
BANDAGE GAUZE ELAST BULKY 4 IN (GAUZE/BANDAGES/DRESSINGS) ×2 IMPLANT
BENZOIN TINCTURE PRP APPL 2/3 (GAUZE/BANDAGES/DRESSINGS) IMPLANT
BLADE SURG ROTATE 9660 (MISCELLANEOUS) IMPLANT
CLOTH BEACON ORANGE TIMEOUT ST (SAFETY) ×2 IMPLANT
CORDS BIPOLAR (ELECTRODE) ×2 IMPLANT
COVER SURGICAL LIGHT HANDLE (MISCELLANEOUS) ×2 IMPLANT
CUFF TOURNIQUET SINGLE 18IN (TOURNIQUET CUFF) IMPLANT
CUFF TOURNIQUET SINGLE 24IN (TOURNIQUET CUFF) ×2 IMPLANT
DRSG EMULSION OIL 3X3 NADH (GAUZE/BANDAGES/DRESSINGS) ×2 IMPLANT
GAUZE XEROFORM 1X8 LF (GAUZE/BANDAGES/DRESSINGS) IMPLANT
GLOVE BIOGEL PI IND STRL 6.5 (GLOVE) ×1 IMPLANT
GLOVE BIOGEL PI IND STRL 8.5 (GLOVE) ×1 IMPLANT
GLOVE BIOGEL PI INDICATOR 6.5 (GLOVE) ×1
GLOVE BIOGEL PI INDICATOR 8.5 (GLOVE) ×1
GLOVE SURG ORTHO 8.0 STRL STRW (GLOVE) ×2 IMPLANT
GLOVE SURG SS PI 7.0 STRL IVOR (GLOVE) ×2 IMPLANT
GOWN STRL NON-REIN LRG LVL3 (GOWN DISPOSABLE) ×4 IMPLANT
KIT BASIN OR (CUSTOM PROCEDURE TRAY) ×2 IMPLANT
KIT ROOM TURNOVER OR (KITS) ×2 IMPLANT
MANIFOLD NEPTUNE II (INSTRUMENTS) IMPLANT
NS IRRIG 1000ML POUR BTL (IV SOLUTION) ×2 IMPLANT
PACK ORTHO EXTREMITY (CUSTOM PROCEDURE TRAY) ×2 IMPLANT
PAD ARMBOARD 7.5X6 YLW CONV (MISCELLANEOUS) ×4 IMPLANT
PAD CAST 4YDX4 CTTN HI CHSV (CAST SUPPLIES) ×1 IMPLANT
PADDING CAST COTTON 4X4 STRL (CAST SUPPLIES) ×1
SPLINT FIBERGLASS 3X35 (CAST SUPPLIES) ×2 IMPLANT
SPONGE GAUZE 4X4 12PLY (GAUZE/BANDAGES/DRESSINGS) ×2 IMPLANT
STRIP CLOSURE SKIN 1/2X4 (GAUZE/BANDAGES/DRESSINGS) IMPLANT
SUCTION FRAZIER TIP 10 FR DISP (SUCTIONS) ×2 IMPLANT
SUT ETHILON 4 0 P 3 18 (SUTURE) IMPLANT
SUT ETHILON 5 0 P 3 18 (SUTURE)
SUT NYLON ETHILON 5-0 P-3 1X18 (SUTURE) IMPLANT
SUT PROLENE 4 0 P 3 18 (SUTURE) IMPLANT
TOWEL OR 17X24 6PK STRL BLUE (TOWEL DISPOSABLE) ×2 IMPLANT
TOWEL OR 17X26 10 PK STRL BLUE (TOWEL DISPOSABLE) ×2 IMPLANT
TUBE SUCT ARGYLE STRL (TUBING) ×2 IMPLANT
WATER STERILE IRR 1000ML POUR (IV SOLUTION) ×2 IMPLANT

## 2011-07-23 NOTE — Addendum Note (Signed)
Addendum  created 07/23/11 1633 by Judie Petit, MD   Modules edited:Orders, PRL Based Order Sets

## 2011-07-23 NOTE — Preoperative (Signed)
Beta Blockers   Reason not to administer Beta Blockers:Not Applicable 

## 2011-07-23 NOTE — Progress Notes (Signed)
ANTICOAGULATION & ANTIBIOTIC CONSULT NOTE - Initial Consult  Pharmacy Consult for Coumadin/anticoag and Vancomycin Indication: hx of PE and DVT;  post-op prophylaxis  Allergies  Allergen Reactions  . Adhesive (Tape) Other (See Comments)    Can only use paper tape  . Chlorpromazine Hcl Other (See Comments)    unknown  . Codeine     REACTION: Unknown reaction  . Morphine     REACTION: caused change in mental status, she does not want to try it again.  . Sulfonamide Derivatives Other (See Comments)    nausea    Patient Measurements: Height: 5\' 7"  (170.2 cm) Weight: 330 lb 4 oz (149.8 kg) IBW/kg (Calculated) : 61.6   Vital Signs: Temp: 97.5 F (36.4 C) (07/03 1730) Temp src: Oral (07/03 1040) BP: 112/44 mmHg (07/03 1823) Pulse Rate: 76  (07/03 1833)  Labs:  Basename 07/23/11 1110 07/22/11 1300 07/22/11  HGB -- -- --  HCT -- -- --  PLT -- -- --  APTT 32 -- --  LABPROT 20.0* -- --  INR 1.67* 2.20 2.2  HEPARINUNFRC -- -- --  CREATININE -- -- --  CKTOTAL -- -- --  CKMB -- -- --  TROPONINI -- -- --    Estimated Creatinine Clearance: 105.8 ml/min (by C-G formula based on Cr of 0.72).   Medical History: Past Medical History  Diagnosis Date  . COPD (chronic obstructive pulmonary disease)   . Depression     followed by Dr. Gwyndolyn Kaufman, [facility]  . GERD (gastroesophageal reflux disease)   . Hypertension   . Hyperlipidemia   . Interstitial cystitis     followed by Dr. Isabel Caprice  . Irritable bowel syndrome     colonoscopy 12/2003 and EGD 1997 WNL, followed by Dr. Marina Goodell  . Spinal stenosis     L3-L4, L4-L5 per MRI 12/2004, status post lumbar decompression 02/2007 by Dr. Gerlene Fee  . Pulmonary embolism 10/2007    on coumadin and followed by Dr. Alexandria Lodge  . OSA on CPAP     noncompliance  . S/P mastectomy 1993    bilateral. status post reconstruction in 1994 by Dr. Benna Dunks  . S/P rhinoplasty 1993  . History of back surgery 02/2007  . PONV (postoperative nausea and vomiting)   . Basal  cell carcinoma 09/2001    followed by Dr. Karlyn Agee SKIN [facility]   Assessment:  s/p carpectomy.  Coumadin has been on hold since 07/16/11.  Outpatient Coumadin dosing per Heme/Onc [facility]) Coumadin Clinic.   Had been taking Coumadin 4 mg daily, except 2 mg on Wednesdays and Fridays. INR 2.2 yesterday, down to 1.67 today. Per 07/11/11 plan per Coumadin Clinic (and approved by Dr. Gaylyn Rong), Lovenox to begin 24 hours post-op, and continue until INR >2.       Vancomycin x 2 days post-op planned. Received Cefazolin 3 grams IV pre-op ~2pm today.  Goal of Therapy:  INR 2-3 Monitor platelets by anticoagulation protocol: Yes Vancomycin trough levels 10-15 mcg/ml   Plan:    Coumadin 4 mg tonight.   Daily PT/INR to begin in am.  CBC in am.   Will follow-up for Lovenox initiation ~24 hrs post-op, if OK with [name] from surgical standpoint.    Vancomcyin 1500 mg IV q12hrs x 4 doses.  Dennie Fetters, RPh Pager: [phone] 07/23/2011,7:55 PM

## 2011-07-23 NOTE — Brief Op Note (Signed)
07/23/2011  3:56 PM  PATIENT:  [name]  66 y.o. female  PRE-OPERATIVE DIAGNOSIS:  LEFT WRIST ADVANCED KIENBOCKS  POST-OPERATIVE DIAGNOSIS:  avascular necrosis of ulnate  PROCEDURE:  Procedure(s) (LRB): CARPECTOMY (Left)  SURGEON:  Surgeon(s) and Role:    * Sharma Covert, MD - Primary  PHYSICIAN ASSISTANT:   ASSISTANTS: none   ANESTHESIA:   general  EBL:  Total I/O In: 1000 [I.V.:1000] Out: 125 [Urine:125]  BLOOD ADMINISTERED:0 CC PRBC  DRAINS: none   LOCAL MEDICATIONS USED:  MARCAINE     SPECIMEN:  Biopsy / Limited Resection  DISPOSITION OF SPECIMEN:  PATHOLOGY  COUNTS:  YES  TOURNIQUET:   Total Tourniquet Time Documented: Upper Arm (Left) - 68 minutes  DICTATION: .Other Dictation: Dictation Number [id number]  PLAN OF CARE: Admit to inpatient   PATIENT DISPOSITION:  PACU - hemodynamically stable.   Delay start of Pharmacological VTE agent (>24hrs) due to surgical blood loss or risk of bleeding: not applicable

## 2011-07-23 NOTE — H&P (Signed)
[name] is an 66 y.o. female.   Chief Complaint: LEFT WRIST PAIN HPI: CHRONIC LEFT WRIST PAIN EXAM AND RADIOGRAPHS CONSISTENT WITH ADVANCED KIENBOCKS DISEASE PT HERE FOR SURGERY TO HELP WITH PAIN IN LEFT WRIST PT HAS BEEN FOLLOWED FOR EXTENDED PERIOD OF TIME IN THE OFFICE  Past Medical History  Diagnosis Date  . COPD (chronic obstructive pulmonary disease)   . Depression     followed by Dr. Gwyndolyn Kaufman, [facility]  . GERD (gastroesophageal reflux disease)   . Hypertension   . Hyperlipidemia   . Interstitial cystitis     followed by Dr. Isabel Caprice  . Irritable bowel syndrome     colonoscopy 12/2003 and EGD 1997 WNL, followed by Dr. Marina Goodell  . Spinal stenosis     L3-L4, L4-L5 per MRI 12/2004, status post lumbar decompression 02/2007 by Dr. Gerlene Fee  . Pulmonary embolism 10/2007    on coumadin and followed by Dr. Alexandria Lodge  . OSA on CPAP     noncompliance  . S/P mastectomy 1993    bilateral. status post reconstruction in 1994 by Dr. Benna Dunks  . S/P rhinoplasty 1993  . History of back surgery 02/2007  . PONV (postoperative nausea and vomiting)   . Basal cell carcinoma 09/2001    followed by Dr. Karlyn Agee SKIN [facility]    Past Surgical History  Procedure Date  . Cervical discectomy 11/1995 and 05/2002    by Dr. Gerlene Fee  . Abdominal hysterectomy 1976  . Bladder suspension 1993  . Orif ankle fracture 1993  . Lumbar laminectomy/decompression microdiscectomy 02/2007    by Dr. Gerlene Fee  . Dilation and curettage of uterus   . Carpel   . Carpal tunnel release     LEFT    Family History  Problem Relation Age of Onset  . Heart disease Mother   . Cancer Brother    Social History:  reports that she quit smoking about 2 months ago. Her smoking use included Cigarettes. She smoked .5 packs per day. She does not have any smokeless tobacco history on file. She reports that she does not drink alcohol or use illicit drugs.  Allergies:  Allergies  Allergen Reactions  . Adhesive (Tape) Other (See Comments)     Can only use paper tape  . Chlorpromazine Hcl Other (See Comments)    unknown  . Codeine     REACTION: Unknown reaction  . Morphine     REACTION: caused change in mental status, she does not want to try it again.  . Sulfonamide Derivatives Other (See Comments)    nausea    Medications Prior to Admission  Medication Sig Dispense Refill  . ferrous sulfate 325 (65 FE) MG EC tablet Take 325 mg by mouth 3 (three) times daily with meals.      . gabapentin (NEURONTIN) 100 MG capsule Take 100 mg by mouth 3 (three) times daily.        Marland Kitchen HYDROcodone-acetaminophen (NORCO) 10-325 MG per tablet Take 1 tablet by mouth every 8 (eight) hours as needed. For pain      . ketoconazole (NIZORAL) 2 % cream Apply topically daily.  15 g  0  . lamoTRIgine (LAMICTAL) 100 MG tablet Take 100 mg by mouth daily.      Marland Kitchen LORazepam (ATIVAN) 1 MG tablet Take 1-2 mg by mouth 2 (two) times daily. Take one tablet in morning and two tablets by mouth at bedtime      . omeprazole (PRILOSEC) 20 MG capsule Take 20 mg by mouth  daily.      . pravastatin (PRAVACHOL) 40 MG tablet Take 40 mg by mouth daily.      . QUEtiapine (SEROQUEL) 200 MG tablet Take 400 mg by mouth at bedtime.        . sertraline (ZOLOFT) 100 MG tablet Take 200 mg by mouth daily.        Marland Kitchen warfarin (COUMADIN) 4 MG tablet Take 1 tablet (4 mg total) by mouth as directed.  30 tablet  0    Results for orders placed during the hospital encounter of 07/23/11 (from the past 48 hour(s))  APTT     Status: Normal   Collection Time   07/23/11 11:10 AM      Component Value Range Comment   aPTT 32  24 - 37 seconds   PROTIME-INR     Status: Abnormal   Collection Time   07/23/11 11:10 AM      Component Value Range Comment   Prothrombin Time 20.0 (*) 11.6 - 15.2 seconds    INR 1.67 (*) 0.00 - 1.49    No results found.  NO RECENT ILLNESSES OR HOSPITALIZATIONS  Blood pressure 145/65, pulse 80, temperature 97.6 F (36.4 C), temperature source Oral, resp. rate 20,  SpO2 96.00%. General Appearance:  Alert, cooperative, no distress, appears stated age  Head:  Normocephalic, without obvious abnormality, atraumatic  Eyes:  Pupils equal, conjunctiva/corneas clear,         Throat: Lips, mucosa, and tongue normal; teeth and gums normal  Neck: No visible masses     Lungs:   respirations unlabored  Chest Wall:  No tenderness or deformity  Heart:  Regular rate and rhythm,  Abdomen:   Soft, non-tender,         Extremities: LEFT WRIST: SKIN INTACT, GOOD DIGITAL MOTION FINGERS WARM WELL PERFUSED LIMITED WRIST MOTION GOOD FOREARM ROTATION  Pulses: 2+ and symmetric  Skin: Skin color, texture, turgor normal, no rashes or lesions     Neurologic: Normal    Assessment/Plan LEFT WRIST ADVANCED KIENBOCKS DISEASE MORBID OBESITY ANTICOAGULATION THERAPY  LEFT WRIST PROXIMAL ROW CARPECTOMY  R/B/A DISCUSSED WITH PT IN OFFICE.  PT VOICED UNDERSTANDING OF PLAN CONSENT SIGNED DAY OF SURGERY PT SEEN AND EXAMINED PRIOR TO OPERATIVE PROCEDURE/DAY OF SURGERY SITE MARKED. QUESTIONS ANSWERED WILL REMAIN AN INPATIENT FOLLOWING SURGERY  Sharma Covert 07/23/2011, 1:20 PM

## 2011-07-23 NOTE — Transfer of Care (Signed)
Immediate Anesthesia Transfer of Care Note  Patient: [name]  Procedure(s) Performed: Procedure(s) (LRB): CARPECTOMY (Left)  Patient Location: PACU  Anesthesia Type: General  Level of Consciousness: awake, alert  and oriented  Airway & Oxygen Therapy: Patient Spontanous Breathing and Patient connected to nasal cannula oxygen  Post-op Assessment: Report given to PACU RN, Post -op Vital signs reviewed and stable and Patient moving all extremities X 4  Post vital signs: Reviewed and stable  Complications: No apparent anesthesia complications

## 2011-07-23 NOTE — Anesthesia Postprocedure Evaluation (Signed)
  Anesthesia Post-op Note  Patient: [name]  Procedure(s) Performed: Procedure(s) (LRB): CARPECTOMY (Left)  Patient Location: PACU  Anesthesia Type: General  Level of Consciousness: awake  Airway and Oxygen Therapy: Patient Spontanous Breathing  Post-op Pain: mild  Post-op Assessment: Post-op Vital signs reviewed  Post-op Vital Signs: Reviewed  Complications: No apparent anesthesia complications

## 2011-07-23 NOTE — Anesthesia Procedure Notes (Signed)
Procedure Name: Intubation Date/Time: 07/23/2011 2:03 PM Performed by: Sharlene Dory E Pre-anesthesia Checklist: Patient identified, Emergency Drugs available, Suction available, Patient being monitored and Timeout performed Patient Re-evaluated:Patient Re-evaluated prior to inductionOxygen Delivery Method: Circle system utilized Preoxygenation: Pre-oxygenation with 100% oxygen Intubation Type: IV induction Ventilation: Mask ventilation without difficulty and Oral airway inserted - appropriate to patient size Laryngoscope Size: Mac and 4 Grade View: Grade I Tube type: Oral Tube size: 7.5 mm Number of attempts: 1 Airway Equipment and Method: Stylet Placement Confirmation: ETT inserted through vocal cords under direct vision,  positive ETCO2 and breath sounds checked- equal and bilateral Secured at: 22 cm Tube secured with: Tape Dental Injury: Teeth and Oropharynx as per pre-operative assessment

## 2011-07-23 NOTE — Anesthesia Preprocedure Evaluation (Addendum)
Anesthesia Evaluation  Patient identified by MRN, date of birth, ID band  Reviewed: Allergy & Precautions, H&P , NPO status , Patient's Chart, lab work & pertinent test results  History of Anesthesia Complications (+) PONV  Airway Mallampati: II      Dental  (+) Edentulous Upper and Edentulous Lower   Pulmonary sleep apnea , COPD COPD inhaler,          Cardiovascular hypertension,     Neuro/Psych Anxiety    GI/Hepatic Neg liver ROS, GERD-  ,  Endo/Other  Morbid obesity  Renal/GU negative Renal ROS     Musculoskeletal   Abdominal   Peds  Hematology   Anesthesia Other Findings   Reproductive/Obstetrics                         Anesthesia Physical Anesthesia Plan  ASA: III  Anesthesia Plan: General   Post-op Pain Management:    Induction: Intravenous  Airway Management Planned: Oral ETT  Additional Equipment:   Intra-op Plan:   Post-operative Plan:   Informed Consent: I have reviewed the patients History and Physical, chart, labs and discussed the procedure including the risks, benefits and alternatives for the proposed anesthesia with the patient or authorized representative who has indicated his/her understanding and acceptance.     Plan Discussed with: CRNA and Surgeon  Anesthesia Plan Comments:         Anesthesia Quick Evaluation

## 2011-07-24 ENCOUNTER — Encounter (HOSPITAL_COMMUNITY): Payer: Self-pay | Admitting: General Practice

## 2011-07-24 LAB — CBC
HCT: 31.7 % — ABNORMAL LOW (ref 36.0–46.0)
Hemoglobin: 8.8 g/dL — ABNORMAL LOW (ref 12.0–15.0)
MCH: 21.9 pg — ABNORMAL LOW (ref 26.0–34.0)
MCHC: 27.8 g/dL — ABNORMAL LOW (ref 30.0–36.0)
MCV: 79.1 fL (ref 78.0–100.0)
Platelets: 323 K/uL (ref 150–400)
RBC: 4.01 MIL/uL (ref 3.87–5.11)
RDW: 18.3 % — ABNORMAL HIGH (ref 11.5–15.5)
WBC: 9.3 K/uL (ref 4.0–10.5)

## 2011-07-24 LAB — PROTIME-INR
INR: 1.65 — ABNORMAL HIGH (ref 0.00–1.49)
Prothrombin Time: 19.8 seconds — ABNORMAL HIGH (ref 11.6–15.2)

## 2011-07-24 MED ORDER — ENOXAPARIN SODIUM 150 MG/ML ~~LOC~~ SOLN
150.0000 mg | Freq: Two times a day (BID) | SUBCUTANEOUS | Status: DC
  Administered 2011-07-24 – 2011-07-27 (×6): 150 mg via SUBCUTANEOUS
  Filled 2011-07-24 (×9): qty 1

## 2011-07-24 MED ORDER — WARFARIN SODIUM 6 MG PO TABS
6.0000 mg | ORAL_TABLET | Freq: Once | ORAL | Status: AC
  Administered 2011-07-24: 6 mg via ORAL
  Filled 2011-07-24: qty 1

## 2011-07-24 NOTE — Clinical Social Work Psychosocial (Addendum)
    Clinical Social Work Department BRIEF PSYCHOSOCIAL ASSESSMENT 07/28/2011  Patient:  [name], [name]     Account Number:  [id number]     Admit date:  07/23/2011  Clinical Social Worker:  Margaree Mackintosh  Date/Time:  07/24/2011 01:52 PM  Referred by:  Physician  Date Referred:  07/24/2011 Referred for  SNF Placement   Other Referral:   Interview type:  Patient Other interview type:    PSYCHOSOCIAL DATA Living Status:  ALONE Admitted from facility:   Level of care:   Primary support name:  [phone] Primary support relationship to patient:  CHILD, ADULT Degree of support available:   Unknown.    CURRENT CONCERNS Current Concerns  Post-Acute Placement   Other Concerns:    SOCIAL WORK ASSESSMENT / PLAN Clinical Social Worker recieved referral for potential SNF placement at time of dc.  CSW reviewed chart and met with pt.  CSW introduced self and explained role.  CSW reviewed current PT recommendations for SNF.  Pt agreeable.  CSW reviewed SNF process and allowed pt to review questions/concerns.  CSW submitted appropriate paperwork for SNF review.  CSW updated Unit CSW-Joseph Johns Jaci Lazier, who will continue to work with pt.   Assessment/plan status:  Information/Referral to [facility] Other assessment/ plan:   Information/referral to community resources:   SNF.    PATIENT'S/FAMILY'S RESPONSE TO PLAN OF CARE: Pt was pleasant and appropriately engaged.  Pt appeared to speak in an open and honest manner.  Pt thanked CSW for intervention.

## 2011-07-24 NOTE — Evaluation (Signed)
Occupational Therapy Evaluation Patient Details Name: [name] MRN: [id number] DOB: 05/09/1945 Today's Date: 07/24/2011 Time: 8469-6295 OT Time Calculation (min): 43 min  OT Assessment / Plan / Recommendation Clinical Impression  Pt s/p L carpectomy presenting with L UE NWB, pain and overall decreased I with ADL. Pt will benefit from skilled OT to maximize I with ADL and ADL mobility prior to d/c.     OT Assessment  Patient needs continued OT Services    Follow Up Recommendations  Skilled nursing facility    Barriers to Discharge      Equipment Recommendations  Defer to next venue    Recommendations for Other Services    Frequency  Min 2X/week    Precautions / Restrictions Precautions Precautions: Fall Restrictions Weight Bearing Restrictions: Yes LUE Weight Bearing: Non weight bearing   Pertinent Vitals/Pain Pt reports 4/10 LUE pain. RN made aware    ADL  Grooming: Performed;Wash/dry face;Set up Where Assessed - Grooming: Unsupported sitting Lower Body Dressing: Performed;+1 Total assistance (don socks) Where Assessed - Lower Body Dressing: Unsupported sitting Toilet Transfer: Simulated;Moderate assistance (EOB to chair) Toilet Transfer Method: Stand pivot Equipment Used: Gait belt Transfers/Ambulation Related to ADLs: Pt Min A for sit to stand and Mod A for stnad pivot transfer with pt WB through Left elbow on therapist. No knee buckling noted ADL Comments: Pt educated on edema control     OT Diagnosis: Generalized weakness;Acute pain  OT Problem List: Decreased range of motion;Decreased activity tolerance;Impaired balance (sitting and/or standing);Decreased knowledge of use of DME or AE;Decreased knowledge of precautions;Pain;Impaired UE functional use;Increased edema OT Treatment Interventions: Self-care/ADL training;DME and/or AE instruction;Therapeutic activities;Patient/family education;Balance training   OT Goals Acute Rehab OT Goals OT Goal  Formulation: With patient Time For Goal Achievement: 08/07/11 Potential to Achieve Goals: Good ADL Goals Pt Will Perform Grooming: with supervision;Standing at sink ADL Goal: Grooming - Progress: Goal set today Pt Will Perform Upper Body Dressing: with set-up;with supervision;Sitting, chair;Sitting, bed ADL Goal: Upper Body Dressing - Progress: Goal set today Pt Will Transfer to Toilet: with supervision;Ambulation;with DME;3-in-1 ADL Goal: Toilet Transfer - Progress: Goal set today Pt Will Perform Toileting - Clothing Manipulation: Standing;with min assist ADL Goal: Toileting - Clothing Manipulation - Progress: Goal set today Additional ADL Goal #2: Pt will I'ly verbalize need for and demonstrate edema control techniques for LUE ADL Goal: Additional Goal #2 - Progress: Goal set today  Visit Information  Last OT Received On: 07/24/11 Assistance Needed: +1    Subjective Data  Subjective: I'm still waiting to see my doctor Patient Stated Goal: Return home   Prior Functioning  Home Living Lives With: Alone Available Help at Discharge: Personal care attendant (3hrs/day 5days/week) Type of Home: Apartment Home Layout: One level Bathroom Shower/Tub: Tub/shower unit;Curtain Firefighter: Handicapped height Bathroom Accessibility: Yes How Accessible: Accessible via walker Home Adaptive Equipment: Shower chair with back;Wheelchair - manual Additional Comments: wheelchair for community use Prior Function Level of Independence: Needs assistance Needs Assistance: Bathing;Dressing;Meal Prep;Light Housekeeping Bath: Minimal Dressing: Moderate Meal Prep: Moderate Light Housekeeping: Maximal Able to Take Stairs?: No Driving: No Vocation: On disability Comments: pt reports freq falls in last 6 months due to bil knee buckling Communication Communication: No difficulties Dominant Hand: Right    Cognition  Overall Cognitive Status:  (appeared slow to process,  lethargic) Arousal/Alertness: Lethargic Orientation Level: Appears intact for tasks assessed Behavior During Session: Lethargic Cognition - Other Comments: max v/c's to maintain eye opening, pt tearful    Extremity/Trunk Assessment  Right Upper Extremity Assessment RUE ROM/Strength/Tone: Within functional levels Left Upper Extremity Assessment LUE ROM/Strength/Tone: Deficits;Due to pain LUE ROM/Strength/Tone Deficits: shld/elbow WFL, wrist in cast = no ROM, able to wiggle fingers. ring finger unable to extend fully LUE Sensation: WFL - Light Touch LUE Coordination:  (unable to test due to casting) Right Lower Extremity Assessment RLE ROM/Strength/Tone: Deficits;Due to pain RLE ROM/Strength/Tone Deficits: pt with 3-/5 strength grossly, limited standing tolerance Left Lower Extremity Assessment LLE ROM/Strength/Tone: Deficits;Due to pain LLE ROM/Strength/Tone Deficits: grossly 3-/5 strength, pt reports they buckle all the time Trunk Assessment Trunk Assessment: Normal   Mobility Bed Mobility Bed Mobility: Not assessed (pt received sitting up in chair) Transfers Sit to Stand: 4: Min assist;From bed;With upper extremity assist Sit to Stand: Patient Percentage: 50% Stand to Sit: 4: Min assist;With armrests;To chair/3-in-1 Details for Transfer Assistance: Pt able to stand from bed with Min A. Allowed pt to WB through elbow on therapist to facilitate sit to stand and stand pivot transfer   Exercise    Balance    End of Session OT - End of Session Equipment Utilized During Treatment: Gait belt Activity Tolerance: Patient limited by fatigue;Patient limited by pain Patient left: in chair;with call bell/phone within reach Nurse Communication: Mobility status  GO     [name] 07/24/2011, 1:48 PM

## 2011-07-24 NOTE — Progress Notes (Signed)
Clinical Social Work Department CLINICAL SOCIAL WORK PLACEMENT NOTE 07/24/2011  Patient:  YENNI, CARRA  Account Number:  [id number] Admit date:  07/23/2011  Clinical Social Worker:  Margaree Mackintosh  Date/time:  07/24/2011 01:56 PM  Clinical Social Work is seeking post-discharge placement for this patient at the following level of care:   SKILLED NURSING   (*CSW will update this form in Epic as items are completed)   07/24/2011  Patient/family provided with [facility] Department of Clinical Social Work's list of facilities offering this level of care within the geographic area requested by the patient (or if unable, by the patient's family).  07/24/2011  Patient/family informed of their freedom to choose among providers that offer the needed level of care, that participate in Medicare, Medicaid or managed care program needed by the patient, have an available bed and are willing to accept the patient.  07/24/2011  Patient/family informed of [facility]' ownership interest in [facility], as well as of the fact that they are under no obligation to receive care at this facility.  PASARR submitted to EDS on 07/24/2011 PASARR number received from EDS on   FL2 transmitted to all facilities in geographic area requested by pt/family on  07/24/2011 FL2 transmitted to all facilities within larger geographic area on   Patient informed that his/her managed care company has contracts with or will negotiate with  certain facilities, including the following:     Patient/family informed of bed offers received:   Patient chooses bed at  Physician recommends and patient chooses bed at    Patient to be transferred to  on   Patient to be transferred to facility by   The following physician request were entered in Epic:   Additional Comments: Pt expressed interest in [facility].

## 2011-07-24 NOTE — Op Note (Signed)
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  ATTENDING PHYSICIAN:  Jennifer Covert IV, MD, who scrubbed and present for the entire procedure.  ASSISTANT SURGEON:  None.  ANESTHESIA:  General via endotracheal tube.  TOURNIQUET:  None.  SURGICAL PROCEDURE: 1. Left wrist proximal row carpectomy, 3-bone carpectomy. 2. Left wrist excision of posterior interosseous nerve. 3. Left wrist EPL tendon sheath and tendon transfer. 4. Radiographs 3 views, left wrist.  INTRAOPERATIVE FINDINGS:  The patient did have the advanced stage [name] Kienbock disease.  The patient did have some articular cartilage loss on the dorsal surface of the capitate, but the head of the capitate still had a good preservation of the cartilage.  Therefore, proximal row carpectomy was carried out.  SURGICAL INDICATIONS:  [name] is a 66 year old female who has persistent dorsal wrist pain.  The patient elected to undergo the above procedure.  Risks, benefits, and alternatives were discussed in detail with the patient and signed informed consent was obtained.  Risks include, but not limited to bleeding; infection; damage to nearby nerves, arteries, or tendons; loss of motion of the elbow, wrist and digits; and need for further surgical intervention.  DESCRIPTION OF PROCEDURE:  The patient was properly identified in the preoperative holding area, marked with a permanent marker made on the left wrist finger to indicate the correct operative site.  The patient was then  brought back to the operating room, placed supine on the anesthesia room table where general anesthesia was administered.  The patient tolerated this well.  A well-padded tourniquet was then placed on the left brachium and sealed with 1000 drape.  The left upper extremity was then prepped and draped in normal sterile fashion.  Time- out was called, correct side was identified, and the procedure was then begun.  Attention was then turned to the left wrist.  The patient was then placed in the 5 pounds finger trap traction.  Longitudinal incision was made directly over the dorsal aspect of the wrist.  Dissection was then carried down through the skin and subcutaneous tissue.  Skin flaps were then raised.  Lister's tubercle was identified and the EPL tendon sheath was incised.  The tendon was then transferred dorsally and radially.  After transfer of the tendon dorsally, the retinaculum of the fourth dorsal compartment was then elevated.  The posterior interosseous nerve was then carried out and inspection of the posterior osseous nerve was then resected to complete the local neurectomy.  Following this, radially-based ligament-sparing dorsal capsular incision was then made elevating the capsule radially.  Once this was carried out, the patient did have the fragmentation of the lunate.  The lunate was fragmented and fractured in several pieces.  Piecemeal removal of the lunate was  then carried out.  After this was carried out, completion of the 3-bone carpectomy was then carried out in piecemeal fashion removing the scaphoid and the triquetrum.  Attention was then turned to preserve the volar ligaments, the radioscaphoid capitate ligament.  Once this was carried out, the wound was then thoroughly irrigated, all small pieces were then removed.  Final radiographs were then obtained.  All 3 views of the wrist showed a good settling of the capitate within the lunate fossa.  Again, the patient did  have relatively good preservation of the head of the capitate, did have some dorsal loss, but it was felt amenable to proximal row carpectomy.  The copious irrigation [name] throughout.  Following this, capsular closure was then carried out with 2-0 Vicryl.  The retinaculum was then closed with 3-0 Vicryl, subcutaneous tissue was closed with 4-0 Vicryl, and the skin closed with simple 4-0 Prolene.  A 10 mL of 0.25% Marcaine infiltrated locally. Adaptic dressing and sterile compressive bandage were then applied. Final radiographs were then obtained.  The patient was then placed in a well-padded volar splint, extubated, and taken to the recovery room in good condition.  Intraoperative radiographs 3 views of the wrist do show the proximal row carpectomy with a capitate within the lunate fossa.  POSTOPERATIVE PLAN:  The patient will be admitted overnight for [name] antibiotics and pain control.  Given the patient's multiple comorbidities, the patient will likely require short-stay rehab. Continue to follow her in the office in approximately 2 weeks for wound check, suture removal, x-rays, and then short-arm cast for total of 6 weeks, short-arm brace at the 6-week mark, and radiographs at each visit.     Jennifer Done, MD     FWO/MEDQ  D:  07/23/2011  T:  07/24/2011  Job:  [id number]

## 2011-07-24 NOTE — Progress Notes (Signed)
POD #1 PT SEEN EXAMINED PAIN CONTROLLED WIGGLES FINGERS FINGERS WARM WELL PERFUSED  PLAN: LOVENOX PER PHARMACY FOR H/O OF DVT/PE SOCIAL WORK FOR D/C PLANNING ANTICIPATE D/C WHEN BED AVAILABLE CONT WITH OT/PT CONTINUE INPATIENT CARE D/C FOLEY

## 2011-07-24 NOTE — Progress Notes (Addendum)
ANTICOAGULATION  CONSULT NOTE - Follow up Consult  Pharmacy Consult for Coumadin Indication: Hx/o PE/DVT  Allergies  Allergen Reactions  . Adhesive (Tape) Other (See Comments)    "I break out all over"; Can only use paper tape  . Chlorpromazine Hcl Nausea And Vomiting  . Codeine Other (See Comments)    "first dose I took, my aide found me the next day laying on the floor"  . Morphine Other (See Comments)    caused change in mental status, she does not want to try it again.  . Sulfonamide Derivatives Nausea And Vomiting    Patient Measurements: Height: 5\' 7"  (170.2 cm) Weight: 330 lb 4 oz (149.8 kg) IBW/kg (Calculated) : 61.6   Vital Signs: Temp: 98.4 F (36.9 C) (07/04 0500) BP: 131/56 mmHg (07/04 0500) Pulse Rate: 77  (07/04 0500)  Labs:  Basename 07/24/11 0455 07/23/11 1110 07/22/11 1300  HGB 8.8* -- --  HCT 31.7* -- --  PLT 323 -- --  APTT -- 32 --  LABPROT 19.8* 20.0* --  INR 1.65* 1.67* 2.20  HEPARINUNFRC -- -- --  CREATININE -- -- --  CKTOTAL -- -- --  CKMB -- -- --  TROPONINI -- -- --    Estimated Creatinine Clearance: 105.8 ml/min (by C-G formula based on Cr of 0.72).   Medical History: Past Medical History  Diagnosis Date  . COPD (chronic obstructive pulmonary disease)   . Depression     followed by Dr. Gwyndolyn Kaufman, [facility]  . GERD (gastroesophageal reflux disease)   . Hypertension   . Hyperlipidemia   . Interstitial cystitis     followed by Dr. Isabel Caprice  . Irritable bowel syndrome     colonoscopy 12/2003 and EGD 1997 WNL, followed by Dr. Marina Goodell  . Spinal stenosis     L3-L4, L4-L5 per MRI 12/2004, status post lumbar decompression 02/2007 by Dr. Gerlene Fee  . Pulmonary embolism 10/2007    on coumadin and followed by Dr. Alexandria Lodge  . OSA on CPAP     noncompliance  . S/P mastectomy 1992    bilateral. status post reconstruction in 1994 by Dr. Benna Dunks  . S/P rhinoplasty 1993  . PONV (postoperative nausea and vomiting)   . Pneumonia   . Exertional dyspnea   .  Basal cell carcinoma 09/2001    followed by Dr. Karlyn Agee [facility]  . Breast cancer     bilaterally  . Headache     "use to have them alot" (07/24/11)  . Arthritis     "everywhere"  . Chronic lower back pain   . Anxiety    Assessment:  POD#1 s/p carpectomy.  INR = 1.65  H/H = 8.8/31.7 today, down from 9.5/32.6 on 6/25. PLTC stable at 314K.  No bleeding reported.   Coumadin resumed 7/3 post op. Coumadin previously on hold since 07/16/11.  Outpatient Coumadin dosing is managed by  [facility].   Had been taking Coumadin 4 mg daily, except 2 mg on Wednesdays and Fridays. INR today is subtherapeutic, down from 1.67 on 7/3 and 2.2 on 07/22/11. Per 07/11/11 plan per Coumadin Clinic (and approved by Dr. Gaylyn Rong), Lovenox to begin 24 hours post-op, and continue until INR >2.    Goal of Therapy:  INR 2-3 Monitor platelets by anticoagulation protocol: Yes   Plan:  Coumadin 6 mg tonight. Daily PT/INR [name] - Please let us know if Lovenox desired to be started ~24 hrs post-op, as noted per [facility] noted on 07/11/11.  Arman Filter, [place] Pager: [phone] 07/24/2011,9:43 AM  Order from Dr. Orlan Leavens to begin full dose lovenox 24h post op.  149.8kg, and CrCl 105 ml/min.  Will begin Lovenox 150 mg Sq q12h, follow SCr and CBC q72h.  Thank you for allowing pharmacy to be a part of this patients care team.  Lovenia Kim Pharm.D., BCPS Clinical Pharmacist 07/24/2011 5:15 PM Pager: [phone] Phone: [phone]

## 2011-07-24 NOTE — Evaluation (Addendum)
Physical Therapy Evaluation Patient Details Name: [name] MRN: [id number] DOB: November 23, 1945 Today's Date: 07/24/2011 Time: 1133-1200 PT Time Calculation (min): 27 min  PT Assessment / Plan / Recommendation Clinical Impression  pt s/p L carpectomy presenting with L UE NWB. Patient very lethargic this session with decreased ability to participate in PT at this time. Pt able to stand pvt with OT earlier in AM but unable to stand at this time. Patient unsafe to return home due to pt requiring maxA for all mobility and ADLs. Pt onlty has aid 5x/wk for 3 hrs each day. Patient with h/o freq fall in last 6 months. Patient at increased fall risk. Patient to benefit from SNF placement upon d/c to achieve maximal funtional recovery for safe transition home.    PT Assessment  Patient needs continued PT services    Follow Up Recommendations  Skilled nursing facility    Barriers to Discharge Decreased caregiver support pt lives alone with limited aide assistance    Equipment Recommendations  Defer to next venue    Recommendations for Other Services     Frequency 5x/week    Precautions / Restrictions Precautions Precautions: Fall Restrictions Weight Bearing Restrictions: Yes LUE Weight Bearing: Non weight bearing   Pertinent Vitals/Pain Pt unable to rate but reports "pinching" pain in L wrist and bilat LE and  Back pain       Mobility  Bed Mobility Bed Mobility: Not assessed (pt received sitting up in chair) Transfers Transfers: Sit to Stand;Stand to Sit Sit to Stand: 1: +2 Total assist;From chair/3-in-1 Sit to Stand: Patient Percentage: 50% Stand to Sit: 4: Min assist;To chair/3-in-1;With upper extremity assist Details for Transfer Assistance: attempted to stand using R UE and LEs pt unable requiring maxA x2 with bilat knee blocked to achieve full upright standing posture, pt reports "I can't walk" They are going to give way." Ambulation/Gait Ambulation/Gait Assistance: Not  tested (comment) (pt unable to ambulate due to bilat knee "popping" and feeling of "giving away."    Exercises     PT Diagnosis: Difficulty walking;Abnormality of gait;Generalized weakness;Acute pain  PT Problem List: Decreased strength;Decreased range of motion;Decreased activity tolerance;Decreased balance;Decreased mobility;Obesity;Decreased skin integrity PT Treatment Interventions: Gait training;Stair training;Functional mobility training;Therapeutic activities;Therapeutic exercise   PT Goals Acute Rehab PT Goals PT Goal Formulation: With patient Time For Goal Achievement: 08/07/11 Potential to Achieve Goals: Good Pt will go Supine/Side to Sit: with supervision;with HOB 0 degrees PT Goal: Supine/Side to Sit - Progress: Goal set today Pt will go Sit to Stand: with min assist;with upper extremity assist (up to RW.) PT Goal: Sit to Stand - Progress: Goal set today Pt will Transfer Bed to Chair/Chair to Bed: with min assist (with RW.) PT Transfer Goal: Bed to Chair/Chair to Bed - Progress: Goal set today Pt will Ambulate: 16 - 50 feet;with supervision;with rolling walker PT Goal: Ambulate - Progress: Goal set today  Visit Information  Last PT Received On: 07/24/11 Assistance Needed: +2    Subjective Data  Subjective: Pt received sitting up in chair talking on call bell thinking it was the phone. Patient very lethargic with limiteb ability to maintain eye opening.   Prior Functioning  Home Living Lives With: Alone Available Help at Discharge: Personal care attendant (3hrs/day 5days/week) Type of Home: Apartment Home Layout: One level Bathroom Shower/Tub: Tub/shower unit;Curtain Bathroom Toilet: Handicapped height Bathroom Accessibility: Yes How Accessible: Accessible via walker Home Adaptive Equipment: Shower chair with back;Wheelchair - manual Additional Comments: wheelchair for community use Prior  Function Level of Independence: Needs assistance Needs Assistance:  Bathing;Dressing;Meal Prep;Light Housekeeping Bath: Minimal Dressing: Moderate Meal Prep: Moderate Light Housekeeping: Maximal Able to Take Stairs?: No Driving: No Vocation: On disability Comments: pt reports freq falls in last 6 months due to bil knee buckling Communication Communication: No difficulties Dominant Hand: Right    Cognition  Overall Cognitive Status:  (delayed processing, tearful) Arousal/Alertness: Lethargic Orientation Level: Appears intact for tasks assessed Behavior During Session: Lethargic Cognition - Other Comments: max v/c's to maintain eye opening, pt tearful    Extremity/Trunk Assessment Right Upper Extremity Assessment RUE ROM/Strength/Tone: Within functional levels Left Upper Extremity Assessment LUE ROM/Strength/Tone: Deficits;Due to pain LUE ROM/Strength/Tone Deficits: shld/elbow WFL, wrist in cast = no ROM, able to wiggle fingers Right Lower Extremity Assessment RLE ROM/Strength/Tone: Deficits;Due to pain RLE ROM/Strength/Tone Deficits: pt with 3-/5 strength grossly, limited standing tolerance Left Lower Extremity Assessment LLE ROM/Strength/Tone: Deficits;Due to pain LLE ROM/Strength/Tone Deficits: grossly 3-/5 strength, pt reports they buckle all the time Trunk Assessment Trunk Assessment: Normal   Balance    End of Session PT - End of Session Equipment Utilized During Treatment: Gait belt Activity Tolerance: Patient limited by pain;Patient limited by fatigue Patient left: in chair;with call bell/phone within reach Nurse Communication: Mobility status (RN and RN tech made aware of pt's increased lethargy and patient requiring maxAx2 for safe stand pvt transfer back to bed. Instructed staff to not use platform walker at this time due to patient unsuccessful with PT today.  GP     Quamere Mussell, Becky Sax 07/24/2011, 12:24 PM  Lewis Shock, PT, DPT Pager #: [phone] Office #: [phone]

## 2011-07-25 ENCOUNTER — Encounter (HOSPITAL_COMMUNITY): Payer: Self-pay | Admitting: Orthopedic Surgery

## 2011-07-25 LAB — PROTIME-INR
INR: 1.99 — ABNORMAL HIGH (ref 0.00–1.49)
Prothrombin Time: 22.9 seconds — ABNORMAL HIGH (ref 11.6–15.2)

## 2011-07-25 MED ORDER — COUMADIN BOOK
Freq: Once | Status: AC
  Administered 2011-07-25: 15:00:00
  Filled 2011-07-25: qty 1

## 2011-07-25 MED ORDER — WARFARIN VIDEO: Freq: Once | Status: DC

## 2011-07-25 MED ORDER — WARFARIN SODIUM 4 MG PO TABS
4.0000 mg | ORAL_TABLET | Freq: Once | ORAL | Status: AC
  Administered 2011-07-25: 4 mg via ORAL
  Filled 2011-07-25: qty 1

## 2011-07-25 NOTE — Progress Notes (Signed)
ANTICOAGULATION CONSULT NOTE - Follow Up Consult  Pharmacy Consult for Lovenox and Coumadin Indication: Hx DVT and PE  Allergies  Allergen Reactions  . Adhesive (Tape) Other (See Comments)    "I break out all over"; Can only use paper tape  . Chlorpromazine Hcl Nausea And Vomiting  . Codeine Other (See Comments)    "first dose I took, my aide found me the next day laying on the floor"  . Morphine Other (See Comments)    caused change in mental status, she does not want to try it again.  . Sulfonamide Derivatives Nausea And Vomiting    Patient Measurements: Height: 5\' 7"  (170.2 cm) Weight: 330 lb 4 oz (149.8 kg) IBW/kg (Calculated) : 61.6   Vital Signs: Temp: 98.5 F (36.9 C) (07/05 0653) BP: 130/55 mmHg (07/05 0653) Pulse Rate: 77  (07/05 0653)  Labs:  Basename 07/25/11 0550 07/24/11 0455 07/23/11 1110  HGB -- 8.8* --  HCT -- 31.7* --  PLT -- 323 --  APTT -- -- 32  LABPROT 22.9* 19.8* 20.0*  INR 1.99* 1.65* 1.67*  HEPARINUNFRC -- -- --  CREATININE -- -- --  CKTOTAL -- -- --  CKMB -- -- --  TROPONINI -- -- --    Estimated Creatinine Clearance: 105.8 ml/min (by C-G formula based on Cr of 0.72).  Assessment: 66 yo female on Lovenox to Coumadin bridge for a history of DVT and PE. INR is slightly below goal today at 1.99. No bleeding noted. No new labs today.  Goal of Therapy:  INR 2-3 Anti-Xa level 0.6-1.2 units/ml 4hrs after LMWH dose given Monitor platelets by anticoagulation protocol: Yes   Plan:  Coumadin 4 mg po tonight Continue Lovenox 150 mg sq q12h - consider d/c if INR >2 in am F/U daily INR Monitor for s/sx bleeding  [facility], Pharm.D., BCPS Clinical Pharmacist Pager: [phone] 07/25/2011 9:29 AM

## 2011-07-25 NOTE — Progress Notes (Signed)
POD #2 PT DOING OK  NO ACUTE COMPLAINTS  LUE: SPLINT C/D/I WIGGLES DIGITS FINGERS WARM WELL PERFUSED  IMP/PLAN: LEFT WRIST AVN S/P PROXIMAL ROW CARPECTOMY AWAIT PLACEMENT HAVE NOT SEEN FL2 ON CHART TO SIGN NEED HELP FROM SOCIAL WORK DEPT ABOUT GETTING PT OUT OF HOSPITAL

## 2011-07-25 NOTE — Progress Notes (Signed)
CARE MANAGEMENT NOTE 07/25/2011  Patient:  [name], [name]   Account Number:  [id number]  Date Initiated:  07/25/2011  Documentation initiated by:  Vance Peper  Subjective/Objective Assessment:   66 yr old female adm for left wrist advanced Kienbocks disease     Action/Plan:   Patient is for shortterm rehab at [facility] SNF, Social worker is aware. Case Manager signing off.   Anticipated DC Date:  07/26/2011   Anticipated DC Plan:  SKILLED NURSING FACILITY  In-house referral  Clinical Social Worker      DC Planning Services  CM consult      Choice offered to / List presented to:             Status of service:  Completed, signed off    Risk analyst Disposition:  HOME W HOME HEALTH SERVICES

## 2011-07-25 NOTE — Progress Notes (Signed)
Physical Therapy Treatment Note   07/25/11 0822  PT Visit Information  Last PT Received On 07/25/11  Assistance Needed +2  PT Time Calculation  PT Start Time 0822  PT Stop Time 0845  PT Time Calculation (min) 23 min  Subjective Data  Subjective Pt received supine in bed with confusion. Patient with report "no one told me breakfast was here."  Precautions  Precautions Fall  Restrictions  LUE Weight Bearing NWB  Cognition  Overall Cognitive Status Impaired  Area of Impairment Memory;Safety/judgement  Arousal/Alertness Awake/alert  Orientation Level Disoriented to;Time (despite max v/c's)  Behavior During Session [facility] for tasks performed  Memory (impaired short term memory, didn't remember yesterday)  Safety/Judgement Decreased awareness of safety precautions;Decreased safety judgement for tasks assessed  Cognition - Other Comments pt with improved ability to maintain eye opening however remains very confused  Bed Mobility  Bed Mobility Supine to Sit  Supine to Sit 4: Min assist;With rails;HOB elevated  Details for Bed Mobility Assistance strong use of rails, v/c's to con't to scoot to EOB, v/c's to maintain L UE NWB  Transfers  Transfers Sit to Stand;Stand to Sit;Stand Pivot Transfers  Sit to Stand 2: Max assist;From bed;With upper extremity assist  Sit to Stand: Patient Percentage 60%  Stand to Sit 4: Min assist;With upper extremity assist;To chair/3-in-1;With armrests  Stand Pivot Transfers 1: +2 Total assist (with underarm assist)  Stand Pivot Transfers: Patient Percentage 60%  Details for Transfer Assistance significant increase in time, increased trunk flexion, limited foot clearance, wide base of support, increased bilat knee flexion, labored effort  Ambulation/Gait  Ambulation/Gait Assistance Not tested (comment) (pt remains unsafe to use platform walker at this time)  PT - End of Session  Equipment Utilized During Treatment Gait belt  Activity Tolerance Patient  limited by pain;Patient limited by fatigue (cognitive deficits)  Patient left in chair;with call bell/phone within reach  Nurse Communication Mobility status;Weight bearing status (RN present and assisted with transfer to chair. RN and RN tech aware not to use platform as she is not safe at this time)  PT - Assessment/Plan  Comments on Treatment Session Pt with improved ability to participate however remains to have significant impairments in both mobility and cognitive areas. Patient unsafe to return home alone and would benefit from SNF placement to maximize functional recovery for safe transition home.  PT Plan Discharge plan remains appropriate;Frequency remains appropriate  PT Frequency Min 5X/week  Follow Up Recommendations Skilled nursing facility  Equipment Recommended Defer to next venue  Acute Rehab PT Goals  Time For Goal Achievement 08/07/11  PT Goal: Supine/Side to Sit - Progress Progressing toward goal  PT Goal: Sit to Stand - Progress Progressing toward goal  PT Transfer Goal: Bed to Chair/Chair to Bed - Progress Progressing toward goal  PT Goal: Ambulate - Progress Not progressing  PT General Charges  $$ ACUTE PT VISIT 1 Procedure  PT Treatments  $Therapeutic Activity 23-37 mins    Pain: pt unable to rate but con't to call her L UE "this joker" and reports moving her fingers sends her through the roof however only complained of pain when asked.  Lewis Shock, PT, DPT Pager #: [phone] Office #: [phone]

## 2011-07-26 LAB — PROTIME-INR
INR: 1.94 — ABNORMAL HIGH (ref 0.00–1.49)
Prothrombin Time: 22.5 seconds — ABNORMAL HIGH (ref 11.6–15.2)

## 2011-07-26 MED ORDER — WARFARIN SODIUM 6 MG PO TABS
6.0000 mg | ORAL_TABLET | Freq: Once | ORAL | Status: AC
  Filled 2011-07-26: qty 1

## 2011-07-26 NOTE — Progress Notes (Signed)
POD# 3 PT DOING WELL  NO ACUTE COMPLAINTS  LUE: INCISION C/D/I BANDAGE AND DRESSING CHANGED FINGERS SWOLLEN WELL PERFUSED  IMP; LEFT WRIST AVN S/P PROXIMAL ROW CARPECTOMY  PLAN: STILL AWAIT PLACEMENT NO NOTES FROM SW AND NO FL-2 YET ON CHART TO SIGN NEED ASSISTANCE FROM STAFF OR SOME DOCUMENTATION AS TO WHAT IS GOING ON WITH GETTING PATIENT OUT OF THE HOSPITAL

## 2011-07-26 NOTE — Progress Notes (Signed)
ANTICOAGULATION CONSULT NOTE - Follow Up Consult  Pharmacy Consult for Lovenox and Coumadin Indication: Hx DVT and PE  Allergies  Allergen Reactions  . Adhesive (Tape) Other (See Comments)    "I break out all over"; Can only use paper tape  . Chlorpromazine Hcl Nausea And Vomiting  . Codeine Other (See Comments)    "first dose I took, my aide found me the next day laying on the floor"  . Morphine Other (See Comments)    caused change in mental status, she does not want to try it again.  . Sulfonamide Derivatives Nausea And Vomiting    Patient Measurements: Height: 5\' 7"  (170.2 cm) Weight: 342 lb (155.13 kg) IBW/kg (Calculated) : 61.6   Vital Signs: Temp: 97.6 F (36.4 C) (07/06 0855) Temp src: Oral (07/06 0855) BP: 129/56 mmHg (07/06 0548) Pulse Rate: 74  (07/06 0548)  Labs:  Alvira Philips 07/26/11 0712 07/25/11 0550 07/24/11 0455  HGB -- -- 8.8*  HCT -- -- 31.7*  PLT -- -- 323  APTT -- -- --  LABPROT 22.5* 22.9* 19.8*  INR 1.94* 1.99* 1.65*  HEPARINUNFRC -- -- --  CREATININE -- -- --  CKTOTAL -- -- --  CKMB -- -- --  TROPONINI -- -- --    Estimated Creatinine Clearance: 108.1 ml/min (by C-G formula based on Cr of 0.72).  Assessment: 66 yo female on Lovenox to Coumadin bridge for a history of DVT and PE. INR is slightly below goal today at 1.94. No bleeding noted. No new labs today.  Goal of Therapy:  INR 2-3 Anti-Xa level 0.6-1.2 units/ml 4hrs after LMWH dose given Monitor platelets by anticoagulation protocol: Yes   Plan:  1. Coumadin 6 mg po tonight 2. Continue Lovenox 150 mg sq q12h - consider d/c if INR >2 in am 3. F/U daily INR 4. CBC in am 5. Monitor for s/sx bleeding  [facility], [address].D., BCPS Clinical Pharmacist Pager: [phone] 07/26/2011 1:09 PM

## 2011-07-27 LAB — CBC
MCH: 22.6 pg — ABNORMAL LOW (ref 26.0–34.0)
MCHC: 28.9 g/dL — ABNORMAL LOW (ref 30.0–36.0)
MCV: 78.2 fL (ref 78.0–100.0)
Platelets: 298 10*3/uL (ref 150–400)
RDW: 19.1 % — ABNORMAL HIGH (ref 11.5–15.5)

## 2011-07-27 LAB — PROTIME-INR: Prothrombin Time: 25 seconds — ABNORMAL HIGH (ref 11.6–15.2)

## 2011-07-27 MED ORDER — HYDROMORPHONE HCL 2 MG PO TABS: 2.0000 mg | ORAL_TABLET | ORAL | Status: AC | PRN

## 2011-07-27 MED ORDER — VITAMIN C 500 MG PO TABS: 500.0000 mg | ORAL_TABLET | Freq: Every day | ORAL | Status: DC

## 2011-07-27 MED ORDER — HYDROCODONE-ACETAMINOPHEN 10-325 MG PO TABS: 1.0000 | ORAL_TABLET | Freq: Four times a day (QID) | ORAL | Status: DC | PRN

## 2011-07-27 MED ORDER — WARFARIN SODIUM 4 MG PO TABS
4.0000 mg | ORAL_TABLET | Freq: Once | ORAL | Status: AC
  Administered 2011-07-27: 4 mg via ORAL
  Filled 2011-07-27: qty 1

## 2011-07-27 MED ORDER — DOCUSATE SODIUM 100 MG PO CAPS: 100.0000 mg | ORAL_CAPSULE | Freq: Two times a day (BID) | ORAL | Status: DC

## 2011-07-27 NOTE — Discharge Summary (Signed)
Physician Discharge Summary  Patient ID: [name] MRN: [id number] DOB/AGE: 1945/11/16 66 y.o.  Admit date: 07/23/2011 Discharge date: 07/28/2011  Admission Diagnoses: LEFT WRIST ADVANCED Cristobal Goldmann Past Medical History  Diagnosis Date  . COPD (chronic obstructive pulmonary disease)   . Depression     followed by Dr. Gwyndolyn Kaufman, [facility]  . GERD (gastroesophageal reflux disease)   . Hypertension   . Hyperlipidemia   . Interstitial cystitis     followed by Dr. Isabel Caprice  . Irritable bowel syndrome     colonoscopy 12/2003 and EGD 1997 WNL, followed by Dr. Marina Goodell  . Spinal stenosis     L3-L4, L4-L5 per MRI 12/2004, status post lumbar decompression 02/2007 by Dr. Gerlene Fee  . Pulmonary embolism 10/2007    on coumadin and followed by Dr. Alexandria Lodge  . OSA on CPAP     noncompliance  . S/P mastectomy 1992    bilateral. status post reconstruction in 1994 by Dr. Benna Dunks  . S/P rhinoplasty 1993  . PONV (postoperative nausea and vomiting)   . Pneumonia   . Exertional dyspnea   . Basal cell carcinoma 09/2001    followed by Dr. Karlyn Agee [facility]  . Breast cancer     bilaterally  . Headache     "use to have them alot" (07/24/11)  . Arthritis     "everywhere"  . Chronic lower back pain   . Anxiety     Discharge Diagnoses:  SEE HOSPITAL CHART  Surgeries: Procedure(s): CARPECTOMY on 07/23/2011    ConsultantS: NONE  Discharged Condition: Improved  Hospital Course: [name] is an 66 y.o. female who was admitted 07/23/2011 with a chief complaint of No chief complaint on file. , and found to have a diagnosis of LEFT WRIST ADVANCED [place].  They were brought to the operating room on 07/23/2011 and underwent Procedure(s): CARPECTOMY.   PT DID WELL SHORT ARM CAST IN PLACE WOUND LOOKED GOOD PT STAYED IN HOSPITAL AWAITING BED FOR PLACEMENT  They were given perioperative antibiotics: Anti-infectives     Start     Dose/Rate Route Frequency Ordered Stop   07/23/11 2000   vancomycin (VANCOCIN) 1,500  mg in sodium chloride 0.9 % 500 mL IVPB        1,500 mg 250 mL/hr over 120 Minutes Intravenous Every 12 hours 07/23/11 1949 07/25/11 1038   07/23/11 1041   ceFAZolin (ANCEF) 3 g in dextrose 5 % 50 mL IVPB  Status:  Discontinued        3 g 160 mL/hr over 30 Minutes Intravenous 60 min pre-op 07/23/11 1041 07/23/11 1857        .  They were given sequential compression devices, early ambulation, and COUMADIN AND LOVENOX for DVT prophylaxis.  Recent vital signs: Patient Vitals for the past 24 hrs:  BP Temp Temp src Pulse Resp SpO2  07/27/11 0653 116/52 mmHg 98.5 F (36.9 C) Oral 71  16  90 %  07/26/11 2300 112/41 mmHg 97.9 F (36.6 C) Oral 70  18  98 %  07/26/11 1429 105/46 mmHg 98.3 F (36.8 C) Oral 74  16  97 %  .  Recent laboratory studies: No results found.  Discharge Medications:   Medication List  As of 07/27/2011 12:34 PM   TAKE these medications         docusate sodium 100 MG capsule   Commonly known as: COLACE   Take 1 capsule (100 mg total) by mouth 2 (two) times daily.  ferrous sulfate 325 (65 FE) MG EC tablet   Take 325 mg by mouth 3 (three) times daily with meals.      HYDROcodone-acetaminophen 10-325 MG per tablet   Commonly known as: NORCO   Take 1 tablet by mouth every 8 (eight) hours as needed. For pain      HYDROcodone-acetaminophen 10-325 MG per tablet   Commonly known as: NORCO   Take 1 tablet by mouth every 6 (six) hours as needed for pain.      HYDROmorphone 2 MG tablet   Commonly known as: DILAUDID   Take 1 tablet (2 mg total) by mouth every 4 (four) hours as needed for pain (BREAKTHROUGH PAIN NOT RELIEVED BY NORCO).      ketoconazole 2 % cream   Commonly known as: NIZORAL   Apply topically daily.      lamoTRIgine 100 MG tablet   Commonly known as: LAMICTAL   Take 100 mg by mouth daily.      LORazepam 1 MG tablet   Commonly known as: ATIVAN   Take 1-2 mg by mouth 2 (two) times daily. Take one tablet in morning and two tablets by mouth at  bedtime      NEURONTIN 100 MG capsule   Generic drug: gabapentin   Take 100 mg by mouth 3 (three) times daily.      omeprazole 20 MG capsule   Commonly known as: PRILOSEC   Take 20 mg by mouth daily.      pravastatin 40 MG tablet   Commonly known as: PRAVACHOL   Take 40 mg by mouth daily.      SEROQUEL 200 MG tablet   Generic drug: QUEtiapine   Take 400 mg by mouth at bedtime.      vitamin C 500 MG tablet   Commonly known as: ASCORBIC ACID   Take 1 tablet (500 mg total) by mouth daily.      warfarin 4 MG tablet   Commonly known as: COUMADIN   Take 1 tablet (4 mg total) by mouth as directed.      ZOLOFT 100 MG tablet   Generic drug: sertraline   Take 200 mg by mouth daily.            Diagnostic Studies: No results found.  They benefited maximally from their hospital stay and there were no complications.     Disposition:TO NURSING FACILITY   RECOMMENDATIONS: PT WILL NEED TO HAVE INR LEVELS MANAGED BY PHYSICIAN THAT HAD PREVIOUSLY BEEN MANAGING COUMADIN LEVELS  WILL NEED TO SEE ME BACK IN MY OFFICE IN TWO WEEKS KEEP CAST CLEAN AND DRY NO WEIGHT ON LEFT WRIST OK TO USE PLATFORM WALKER KEEP HAND ELEVATED  OK TO MOVE AND ENCOURAGE DIGITAL MOTION Discharge Orders    Future Appointments: Provider: Department: Dept Phone: Center:   12/30/2011 1:15 PM Delcie Roch [facility] [phone] None   12/30/2011 1:45 PM Myrtis Ser, NP [facility] [phone] None        Signed: Sharma Covert 07/27/2011, 12:34 PM

## 2011-07-27 NOTE — Progress Notes (Signed)
Orthopedic Tech Progress Note Patient Details:  [name] 24-Apr-1945 [id number]  Casting Type of Cast: Short arm cast Cast Location: left UE Cast Material: Fiberglass Cast Intervention: Other (comment) MD applied cast.    Servando Kyllonen T 07/27/2011, 1:15 PM

## 2011-07-27 NOTE — Progress Notes (Signed)
ANTICOAGULATION CONSULT NOTE - Follow Up Consult  Pharmacy Consult for Lovenox and Coumadin Indication: Hx DVT and PE  Allergies  Allergen Reactions  . Adhesive (Tape) Other (See Comments)    "I break out all over"; Can only use paper tape  . Chlorpromazine Hcl Nausea And Vomiting  . Codeine Other (See Comments)    "first dose I took, my aide found me the next day laying on the floor"  . Morphine Other (See Comments)    caused change in mental status, she does not want to try it again.  . Sulfonamide Derivatives Nausea And Vomiting    Patient Measurements: Height: 5\' 7"  (170.2 cm) Weight: 342 lb (155.13 kg) IBW/kg (Calculated) : 61.6   Vital Signs: Temp: 98.5 F (36.9 C) (07/07 0653) Temp src: Oral (07/07 0653) BP: 116/52 mmHg (07/07 0653) Pulse Rate: 71  (07/07 0653)  Labs:  Basename 07/27/11 0500 07/26/11 0712 07/25/11 0550  HGB 8.0* -- --  HCT 27.7* -- --  PLT 298 -- --  APTT -- -- --  LABPROT 25.0* 22.5* 22.9*  INR 2.22* 1.94* 1.99*  HEPARINUNFRC -- -- --  CREATININE -- -- --  CKTOTAL -- -- --  CKMB -- -- --  TROPONINI -- -- --    Estimated Creatinine Clearance: 108.1 ml/min (by C-G formula based on Cr of 0.72).  Assessment: 66 yo female on Lovenox to Coumadin bridge for a history of DVT and PE. INR is therapeutic today. Last night's dose was not charted as given. No overt bleeding noted, Hb is low post-op, platelets are wnl.  Home dose is 4 mg daily except 2 mg Wed/Fri.  Goal of Therapy:  INR 2-3 Anti-Xa level 0.6-1.2 units/ml 4hrs after LMWH dose given Monitor platelets by anticoagulation protocol: Yes   Plan:  1. Coumadin 4 mg po tonight 2. Consider d/c Lovenox now that INR >2. Paged Dr. Rennis Chris who is on-call with no response yet 3. F/U daily INR 4. Monitor for s/sx bleeding  [facility], [address].D., BCPS Clinical Pharmacist Pager: [phone] 07/27/2011 12:09 PM

## 2011-07-28 LAB — PROTIME-INR: Prothrombin Time: 27.1 seconds — ABNORMAL HIGH (ref 11.6–15.2)

## 2011-07-28 MED ORDER — WARFARIN SODIUM 2 MG PO TABS
2.0000 mg | ORAL_TABLET | Freq: Once | ORAL | Status: AC
  Administered 2011-07-28: 2 mg via ORAL
  Filled 2011-07-28: qty 1

## 2011-07-28 MED ORDER — WARFARIN SODIUM 2 MG PO TABS
2.0000 mg | ORAL_TABLET | Freq: Once | ORAL | Status: DC
  Filled 2011-07-28: qty 1

## 2011-07-28 NOTE — Progress Notes (Addendum)
ANTICOAGULATION CONSULT NOTE - Follow Up Consult  Pharmacy Consult for Lovenox and Coumadin Indication: Hx DVT and PE  Allergies  Allergen Reactions  . Adhesive (Tape) Other (See Comments)    "I break out all over"; Can only use paper tape  . Chlorpromazine Hcl Nausea And Vomiting  . Codeine Other (See Comments)    "first dose I took, my aide found me the next day laying on the floor"  . Morphine Other (See Comments)    caused change in mental status, she does not want to try it again.  . Sulfonamide Derivatives Nausea And Vomiting    Patient Measurements: Height: 5\' 7"  (170.2 cm) Weight: 342 lb (155.13 kg) IBW/kg (Calculated) : 61.6   Vital Signs: Temp: 98.6 F (37 C) (07/08 0704) Temp src: Oral (07/08 0704) BP: 119/58 mmHg (07/08 0704) Pulse Rate: 79  (07/08 0704)  Labs:  Basename 07/28/11 0415 07/27/11 0500 07/26/11 0712  HGB -- 8.0* --  HCT -- 27.7* --  PLT -- 298 --  APTT -- -- --  LABPROT 27.1* 25.0* 22.5*  INR 2.46* 2.22* 1.94*  HEPARINUNFRC -- -- --  CREATININE -- -- --  CKTOTAL -- -- --  CKMB -- -- --  TROPONINI -- -- --    Estimated Creatinine Clearance: 108.1 ml/min (by C-G formula based on Cr of 0.72).  Assessment: 66 yo female on Coumadin for a history of DVT and PE. INR is therapeutic today. 7/6 dose  was not charted as given. No overt bleeding noted,  Home dose is 4 mg daily except 2 mg Wed/Fri.  Goal of Therapy:  INR goal 2-3    Plan:  1. Coumadin 2 mg po tonight 2. F/U daily INR 3. Monitor for s/sx bleeding  Bola A. Wandra Feinstein D Clinical Pharmacist [phone] Phone [phone] 07/28/2011 9:47 AM   I have reviewed the plan and agree with the above.  Harland German, Pharm D 07/28/2011 9:56 AM

## 2011-07-28 NOTE — Clinical Social Work Placement (Addendum)
    Clinical Social Work Department CLINICAL SOCIAL WORK PLACEMENT NOTE 07/28/2011  Patient:  [name], [name]  Account Number:  [id number] Admit date:  07/23/2011  Clinical Social Worker:  Margaree Mackintosh  Date/time:  07/24/2011 01:56 PM  Clinical Social Work is seeking post-discharge placement for this patient at the following level of care:   SKILLED NURSING   (*CSW will update this form in Epic as items are completed)   07/24/2011  Patient/family provided with [facility] Department of Clinical Social Work's list of facilities offering this level of care within the geographic area requested by the patient (or if unable, by the patient's family).  07/24/2011  Patient/family informed of their freedom to choose among providers that offer the needed level of care, that participate in Medicare, Medicaid or managed care program needed by the patient, have an available bed and are willing to accept the patient.  07/24/2011  Patient/family informed of [facility]' ownership interest in [facility], as well as of the fact that they are under no obligation to receive care at this facility.  PASARR submitted to EDS on 07/24/2011 PASARR number received from EDS on 07/25/2011  FL2 transmitted to all facilities in geographic area requested by pt/family on  07/24/2011 FL2 transmitted to all facilities within larger geographic area on   Patient informed that his/her managed care company has contracts with or will negotiate with  certain facilities, including the following:     Patient/family informed of bed offers received: 7/8/213 Patient chooses bed at  [facility] Physician recommends and patient chooses bed at    Patient to be transferred to [facility]  on  07/28/2011 Patient to be transferred to facility by ambulance  [facility])  The following physician request were entered in Epic:   Additional Comments: Pt expressed interest in [facility]  07/28/2011  [facility] does  not accept patient's insurance.  Bed offers provided and bed chosen. Ok per MD for d/c today to SNF.  Patient is agreeable.  Notified SNF and pt's nurse of d/c plan.   Patient will require Medicaid SNF authorization.  Once obtained- will forward to [employer]. DSS and to [place].

## 2011-07-28 NOTE — Progress Notes (Signed)
Physical Therapy Treatment Note   07/28/11 1040  PT Visit Information  Last PT Received On 07/28/11  Assistance Needed +2  PT Time Calculation  PT Start Time 1040  PT Stop Time 1101  PT Time Calculation (min) 21 min  Subjective Data  Subjective Pt received supine in bed with inconsistent story with RN regarding L UE cast. Patient remains confused. Pt with + cough.  Precautions  Precautions Fall  Restrictions  LUE Weight Bearing NWB  Cognition  Overall Cognitive Status Impaired  Area of Impairment Memory;Safety/judgement  Safety/Judgement Decreased awareness of safety precautions;Decreased safety judgement for tasks assessed  Bed Mobility  Bed Mobility Supine to Sit  Supine to Sit 4: Min assist;With rails;HOB elevated  Details for Bed Mobility Assistance strong use of rails, v/c's to con't to scoot to EOB, v/c's to maintain L UE NWB  Transfers  Transfers Sit to Stand;Stand to Sit;Stand Pivot Transfers  Sit to Stand 2: Max assist;From bed;With upper extremity assist  Sit to Stand: Patient Percentage 60%  Stand to Sit 4: Min assist;With upper extremity assist;To chair/3-in-1;With armrests  Stand Pivot Transfers 1: +2 Total assist  Stand Pivot Transfers: Patient Percentage 60%  Details for Transfer Assistance significant increase in time, increased trunk flexion, limited foot clearance, wide base of support, increased bilat knee flexion, labored effort  Ambulation/Gait  Ambulation/Gait Assistance Not tested (comment) (pt adamently deferred)  PT - End of Session  Equipment Utilized During Treatment Gait belt  Activity Tolerance Patient limited by fatigue  Patient left in chair;with call bell/phone within reach  Nurse Communication Mobility status;Weight bearing status  PT - Assessment/Plan  Comments on Treatment Session Pt with limited progresss towards goals. Patient self-limiting in regard to OOB mobilty despite max encouragement to ambulate with platform walker. Patient with  difficulty maintaining L UE NWB. Patient educated at length on the importance and benefits of OOB mobiltiy.  PT Plan Discharge plan remains appropriate;Frequency needs to be updated  PT Frequency Min 3X/week  Follow Up Recommendations Skilled nursing facility  Equipment Recommended Defer to next venue  Acute Rehab PT Goals  Time For Goal Achievement 08/07/11  PT Goal: Supine/Side to Sit - Progress Progressing toward goal  PT Goal: Sit to Stand - Progress Progressing toward goal  PT Transfer Goal: Bed to Chair/Chair to Bed - Progress Progressing toward goal  PT Goal: Ambulate - Progress Not progressing  PT General Charges  $$ ACUTE PT VISIT 1 Procedure  PT Treatments  $Therapeutic Activity 8-22 mins   Pain: pt report L UE pain but unable to rate  Lewis Shock, PT, DPT Pager #: [phone] Office #: [phone]

## 2011-07-28 NOTE — Progress Notes (Signed)
Pt. Found with cast on Rt. Arm . When questioned she said she wasn't aware of pulling it off.redressed incision on left arm and pt. Slid cast on left arm.  CMS + to fingers warm with good capillary refill. Inc. CD+I  .

## 2011-07-29 ENCOUNTER — Encounter: Payer: Self-pay | Admitting: Pharmacist

## 2011-07-29 NOTE — Progress Notes (Signed)
Pt now at [facility] (ph #[phone]) I s/w Melissa, RN at [facility] & confirmed that pts INR is being monitored while in their care.  She in fact stated that Mersedes's INR = 3.2 today & she already had a call in to MD to address Coumadin dose. We will f/u upon discharge from SNF. Marily Lente, Pharm.D.

## 2011-07-29 NOTE — Progress Notes (Signed)
Utilization review completed. Mcclellan Demarais, RN, BSN. 

## 2011-08-11 ENCOUNTER — Encounter: Payer: Self-pay | Admitting: Pharmacist

## 2011-08-11 NOTE — Progress Notes (Signed)
I called to touch base at [facility] (ph #[phone]) to see if [name] was going to be discharged soon. I s/w Artist Pais, RN. She stated there is no tentative date yet. LOS still unclear. Marily Lente, Pharm.D.

## 2011-09-02 ENCOUNTER — Telehealth: Payer: Self-pay | Admitting: *Deleted

## 2011-09-02 ENCOUNTER — Ambulatory Visit (HOSPITAL_BASED_OUTPATIENT_CLINIC_OR_DEPARTMENT_OTHER): Payer: PRIVATE HEALTH INSURANCE | Admitting: Pharmacist

## 2011-09-02 DIAGNOSIS — I824Y9 Acute embolism and thrombosis of unspecified deep veins of unspecified proximal lower extremity: Secondary | ICD-10-CM

## 2011-09-02 NOTE — Telephone Encounter (Signed)
This mornings results:                                        INR: 4.4                                        PT: 52.2 Pt is on 4mg  coumadin daily, last dose was 8/12 pm  Please advise and i will call pt

## 2011-09-02 NOTE — Patient Instructions (Addendum)
You took your Coumadin today.  Do not take Coumadin on 8/14 and 8/15.  On 8/16, begin taking Coumadin 2mg  daily except 4mg  on MWF.  Recheck INR on 8/21 with [facility].

## 2011-09-02 NOTE — Telephone Encounter (Signed)
i have called the [facility] relayed the message and informed them of the note in EPIC

## 2011-09-02 NOTE — Progress Notes (Signed)
Pt took Coumadin today. She has been on 4mg  daily.  Hold Coumadin on 8/14 and 8/15.  On 8/16, begin taking Coumadin 2mg  daily except 4mg  on MWF.  Recheck INR on 8/21 with [facility]. Her nurse is Selena Batten. Please phone coumadin instructions and return date for INR to [phone].

## 2011-09-02 NOTE — Telephone Encounter (Addendum)
INR has been monitored and coumadin adjusted by SNF.  Once discharged, INR monitoring was to be resumed by the [facility].  Please refer results to [facility] for adjustment.  Please see the progress notes for routing purposes.  Thank You.

## 2011-09-05 ENCOUNTER — Ambulatory Visit (HOSPITAL_BASED_OUTPATIENT_CLINIC_OR_DEPARTMENT_OTHER): Payer: PRIVATE HEALTH INSURANCE | Admitting: Pharmacist

## 2011-09-05 ENCOUNTER — Telehealth: Payer: Self-pay | Admitting: Pharmacist

## 2011-09-05 ENCOUNTER — Telehealth: Payer: Self-pay | Admitting: *Deleted

## 2011-09-05 DIAGNOSIS — I824Y9 Acute embolism and thrombosis of unspecified deep veins of unspecified proximal lower extremity: Secondary | ICD-10-CM

## 2011-09-05 NOTE — Telephone Encounter (Signed)
Call from [place] with PT/INR results (43.8/3.6).  Informed her to call [facility], per Dr Charlesetta Shanks note 09/02/11, at [phone]. She agreed.

## 2011-09-05 NOTE — Telephone Encounter (Signed)
Opened in error

## 2011-09-05 NOTE — Progress Notes (Signed)
INR supratherapeutic today (3.6) per [facility] RN, Darl Pikes. Pt has not taken Coumadin since Tuesday (8/13) this week. No problems with bleeding or bruising.   Will have pt hold Coumadin today, then take 2mg  (1/2 tablet) on Saturday and Sunday.  Pt communicated understanding. Will repeat INR on Monday, 09/08/11 with [facility].  Plan and orders called in to Darl Pikes with [facility] (Phone# [phone]).

## 2011-09-08 ENCOUNTER — Encounter: Payer: PRIVATE HEALTH INSURANCE | Admitting: Internal Medicine

## 2011-09-09 ENCOUNTER — Ambulatory Visit: Payer: Self-pay | Admitting: Pharmacist

## 2011-09-09 DIAGNOSIS — I824Y9 Acute embolism and thrombosis of unspecified deep veins of unspecified proximal lower extremity: Secondary | ICD-10-CM

## 2011-09-09 LAB — PROTIME-INR

## 2011-09-09 NOTE — Progress Notes (Signed)
Pt took Coumadin 4mg  today. Will hold Coumadin tomorrow (8/21) and 8/22.  Recheck INR on 09/11/11 with [facility].

## 2011-09-09 NOTE — Patient Instructions (Addendum)
You took Coumadin 4mg  today. Hold Coumadin tomorrow (8/21) and 8/22.  Recheck INR on 09/11/11 with [facility].

## 2011-09-11 ENCOUNTER — Telehealth: Payer: Self-pay | Admitting: Pharmacist

## 2011-09-11 ENCOUNTER — Encounter: Payer: Self-pay | Admitting: *Deleted

## 2011-09-11 ENCOUNTER — Ambulatory Visit: Payer: Self-pay | Admitting: Pharmacist

## 2011-09-11 DIAGNOSIS — I824Y9 Acute embolism and thrombosis of unspecified deep veins of unspecified proximal lower extremity: Secondary | ICD-10-CM

## 2011-09-11 LAB — POCT INR: INR: 4.61

## 2011-09-11 LAB — PROTIME-INR

## 2011-09-11 NOTE — Telephone Encounter (Signed)
Left message with Darl Pikes [phone]) to verify she left all coumadin out of [name]'s pill box she organized today.  Her INR is 4.61 (climbing slightly) since she was suppose to be off Coumadin.  We will hold coumadin until she is planning to visit her again on Monday.

## 2011-09-11 NOTE — Progress Notes (Signed)
Rec'd PT/INR from [facility], drawn by [facility] on 09/11/11.   INR 4.61.  Coumadin clinic already received result per Lew.

## 2011-09-12 NOTE — Progress Notes (Signed)
Pt has been off coumadin, per patient and Susan ([facility] nurse), since taking dose on Tue.  Susan organized patients pill box to eliminate coumadin until we recheck on Monday.  We will recheck INR on Monday 09/15/11.  

## 2011-09-12 NOTE — Patient Instructions (Signed)
Pt has been off coumadin, per patient and Darl Pikes [facility] nurse), since taking dose on Tue.  Darl Pikes organized patients pill box to eliminate coumadin until we recheck on Monday.  We will recheck INR on Monday 09/15/11.

## 2011-09-15 LAB — POCT INR: INR: 1.7

## 2011-09-16 ENCOUNTER — Ambulatory Visit (HOSPITAL_BASED_OUTPATIENT_CLINIC_OR_DEPARTMENT_OTHER): Payer: PRIVATE HEALTH INSURANCE | Admitting: Pharmacist

## 2011-09-16 DIAGNOSIS — I824Y9 Acute embolism and thrombosis of unspecified deep veins of unspecified proximal lower extremity: Secondary | ICD-10-CM

## 2011-09-16 NOTE — Progress Notes (Signed)
INR subtherapeutic (1.7) after holding Coumadin since 8/20 per pt report.  We are certain that pt held Coumadin since 8/22 since [facility] nurse removed all Coumadin from her pill box.  Suspect that patient may have been taking Coumadin incorrectly when instructed to hold Coumadin previously since her INRs had continued to climb.  Pt is a very poor historian. No complaints of bleeding or bruising per pt.  No changes in meds or diet reported. Will have pt resume Coumadin today at 2mg  daily (1/2 tablet).  Instructed patient to write down each day's dose on a sheet of paper for her reference.  She complied, but could not read back her handwriting to me over the phone.  She said that "it's legible to someone else who can read it.  I don't have my reading glasses right now."  She repeated that she needed to start on 2 mg today. Will have [facility] recheck her INR on Friday, 09/19/11.  Confirmed plan with Myrene Buddy @[facility]  (phone [phone], ext [phone]).

## 2011-09-17 ENCOUNTER — Encounter: Payer: Self-pay | Admitting: Oncology

## 2011-09-18 ENCOUNTER — Ambulatory Visit: Payer: Self-pay | Admitting: Pharmacist

## 2011-09-18 DIAGNOSIS — I824Y9 Acute embolism and thrombosis of unspecified deep veins of unspecified proximal lower extremity: Secondary | ICD-10-CM

## 2011-09-18 NOTE — Patient Instructions (Signed)
Incoming call from [name] [facility]) to report INR of 1.0 today.  PT was instructed to start back with 2mg  daily on Tue when her INR was 1.7.  Although the patient states she took her coumadin as instructed she could not provide the paper she was instructed to document all her doses on.  [name] had a hard time producing her coumadin bottle when [name] asked where it was.  [name] was instructed to fill her pill box with 4 mg for today and tomorrow and proceed with 2mg  on Sat, Sun and Mon.  We will recheck her INR on Tuesday.  Left a message to verify the INR will be done on Tue, not Monday, with Myrene Buddy at [facility] as we will be closed on Monday

## 2011-09-18 NOTE — Progress Notes (Signed)
Incoming call from Jennifer ([facility]) to report INR of 1.0 today.  PT was instructed to start back with 2mg daily on Tue when her INR was 1.7.  Although the patient states she took her coumadin as instructed she could not provide the paper she was instructed to document all her doses on.  [name] had a hard time producing her coumadin bottle when Jennifer asked where it was.  Jennifer was instructed to fill her pill box with 4 mg for today and tomorrow and proceed with 2mg on Sat, Sun and Mon.  We will recheck her INR on Tuesday.  Left a message to verify the INR will be done on Tue, not Monday, with Yvonne at [facility] as we will be closed on Monday 

## 2011-09-23 ENCOUNTER — Ambulatory Visit (HOSPITAL_BASED_OUTPATIENT_CLINIC_OR_DEPARTMENT_OTHER): Payer: PRIVATE HEALTH INSURANCE | Admitting: Pharmacist

## 2011-09-23 DIAGNOSIS — I824Y9 Acute embolism and thrombosis of unspecified deep veins of unspecified proximal lower extremity: Secondary | ICD-10-CM

## 2011-09-23 LAB — POCT INR: INR: 1.5

## 2011-09-23 NOTE — Progress Notes (Signed)
Pt restarted coumadin as instructed but did not take any on Mon as our instructions were not clear when we called back to accommodate for the clinic being closed on Monday.  Pt did not take coumadin on Mon.  Spoke with [name] from [facility] and she said Ms. Brackens would be fine given the instructions for taking a 4 mg tablet today and that she would go by tomorrow and accurately fill her pill box.  New dose should be 4 mg on Tue, Thur and Sat with  2mg  on other days.  [name] will go by on 9/4 to accurately fill the box.  She will recheck her INR on Monday, Sept 9

## 2011-09-23 NOTE — Patient Instructions (Signed)
Pt restarted coumadin as instructed but did not take any on Mon as our instructions were not clear when we called back to accommodate for the clinic being closed on Monday.  Pt did not take coumadin on Mon.  Spoke with Jennifer from [facility] and she said Ms. Alverio would be fine given the instructions for taking a 4 mg tablet today and that she would go by tomorrow and accurately fill her pill box.  New dose should be 4 mg on Tue, Thur and Sat with  2mg on other days.  Jennifer will go by on 9/4 to accurately fill the box.  She will recheck her INR on Monday, Sept 9 

## 2011-09-24 ENCOUNTER — Encounter: Payer: Self-pay | Admitting: Oncology

## 2011-09-29 ENCOUNTER — Ambulatory Visit (HOSPITAL_BASED_OUTPATIENT_CLINIC_OR_DEPARTMENT_OTHER): Payer: PRIVATE HEALTH INSURANCE | Admitting: Pharmacist

## 2011-09-29 DIAGNOSIS — I824Y9 Acute embolism and thrombosis of unspecified deep veins of unspecified proximal lower extremity: Secondary | ICD-10-CM

## 2011-09-29 NOTE — Patient Instructions (Signed)
Brooke from [facility] called to give INR report of 1.7 today.  She states she was on her previous dose and her pill box is empty.  We will change her slightly to 4 mg daily with 2mg on Tue and Thur.  We will recheck her INR in one week via [facility] unless they call to make a change.  The PT/INR is the only service they are providing now and that is not a normality with [facility].  PT searching for PCP.  Brooke said she would call us if anything changes with the services they are providing for [name] 

## 2011-09-29 NOTE — Progress Notes (Signed)
[name] from [facility] called to give INR report of 1.7 today.  She states she was on her previous dose and her pill box is empty.  We will change her slightly to 4 mg daily with 2mg  on Tue and Thur.  We will recheck her INR in one week via [facility] unless they call to make a change.  The PT/INR is the only service they are providing now and that is not a normality with [facility].  PT searching for PCP.  Nehemiah Settle said she would call us if anything changes with the services they are providing for [name]

## 2011-10-06 ENCOUNTER — Emergency Department (HOSPITAL_COMMUNITY)
Admission: EM | Admit: 2011-10-06 | Discharge: 2011-10-06 | Disposition: A | Discharge status: Home / Self Care | Payer: PRIVATE HEALTH INSURANCE | Attending: Emergency Medicine | Admitting: Emergency Medicine

## 2011-10-06 ENCOUNTER — Telehealth: Payer: Self-pay | Admitting: *Deleted

## 2011-10-06 ENCOUNTER — Emergency Department (HOSPITAL_COMMUNITY): Payer: PRIVATE HEALTH INSURANCE

## 2011-10-06 ENCOUNTER — Encounter (HOSPITAL_COMMUNITY): Payer: Self-pay | Admitting: Neurology

## 2011-10-06 DIAGNOSIS — F3289 Other specified depressive episodes: Secondary | ICD-10-CM | POA: Insufficient documentation

## 2011-10-06 DIAGNOSIS — I10 Essential (primary) hypertension: Secondary | ICD-10-CM | POA: Insufficient documentation

## 2011-10-06 DIAGNOSIS — K219 Gastro-esophageal reflux disease without esophagitis: Secondary | ICD-10-CM | POA: Insufficient documentation

## 2011-10-06 DIAGNOSIS — Z85828 Personal history of other malignant neoplasm of skin: Secondary | ICD-10-CM | POA: Insufficient documentation

## 2011-10-06 DIAGNOSIS — Z853 Personal history of malignant neoplasm of breast: Secondary | ICD-10-CM | POA: Insufficient documentation

## 2011-10-06 DIAGNOSIS — F411 Generalized anxiety disorder: Secondary | ICD-10-CM | POA: Insufficient documentation

## 2011-10-06 DIAGNOSIS — Z809 Family history of malignant neoplasm, unspecified: Secondary | ICD-10-CM | POA: Insufficient documentation

## 2011-10-06 DIAGNOSIS — Z9109 Other allergy status, other than to drugs and biological substances: Secondary | ICD-10-CM | POA: Insufficient documentation

## 2011-10-06 DIAGNOSIS — G4733 Obstructive sleep apnea (adult) (pediatric): Secondary | ICD-10-CM | POA: Insufficient documentation

## 2011-10-06 DIAGNOSIS — Z882 Allergy status to sulfonamides status: Secondary | ICD-10-CM | POA: Insufficient documentation

## 2011-10-06 DIAGNOSIS — F329 Major depressive disorder, single episode, unspecified: Secondary | ICD-10-CM | POA: Insufficient documentation

## 2011-10-06 DIAGNOSIS — Z8249 Family history of ischemic heart disease and other diseases of the circulatory system: Secondary | ICD-10-CM | POA: Insufficient documentation

## 2011-10-06 DIAGNOSIS — S5290XA Unspecified fracture of unspecified forearm, initial encounter for closed fracture: Secondary | ICD-10-CM

## 2011-10-06 DIAGNOSIS — Z87891 Personal history of nicotine dependence: Secondary | ICD-10-CM | POA: Insufficient documentation

## 2011-10-06 DIAGNOSIS — Z885 Allergy status to narcotic agent status: Secondary | ICD-10-CM | POA: Insufficient documentation

## 2011-10-06 DIAGNOSIS — W010XXA Fall on same level from slipping, tripping and stumbling without subsequent striking against object, initial encounter: Secondary | ICD-10-CM | POA: Insufficient documentation

## 2011-10-06 DIAGNOSIS — Z888 Allergy status to other drugs, medicaments and biological substances status: Secondary | ICD-10-CM | POA: Insufficient documentation

## 2011-10-06 MED ORDER — HYDROCODONE-ACETAMINOPHEN 5-325 MG PO TABS
2.0000 | ORAL_TABLET | Freq: Once | ORAL | Status: AC
  Administered 2011-10-06: 2 via ORAL
  Filled 2011-10-06: qty 2

## 2011-10-06 MED ORDER — HYDROCODONE-ACETAMINOPHEN 5-325 MG PO TABS: 2.0000 | ORAL_TABLET | ORAL | Status: DC | PRN

## 2011-10-06 NOTE — Telephone Encounter (Signed)
HHN calls to say when she arrived at pt's home and evaluated pt she sent pt via ems to ED, she feels pt should not be alone and and desires a social work in home consult and eval, verbal ok is given, ok?

## 2011-10-06 NOTE — ED Notes (Signed)
Patient transported to X-ray 

## 2011-10-06 NOTE — Progress Notes (Signed)
Orthopedic Tech Progress Note Patient Details:  MERIDA SLATES [birth year] [id number]  Ortho Devices Type of Ortho Device: Arm foam sling;Sugartong splint;Ace wrap Ortho Device/Splint Location: (L) UE Ortho Device/Splint Interventions: Application   Jennye Moccasin 10/06/2011, 6:25 PM

## 2011-10-06 NOTE — Telephone Encounter (Signed)
Ok

## 2011-10-06 NOTE — Telephone Encounter (Signed)
Agree with appt Thanks 

## 2011-10-06 NOTE — Telephone Encounter (Signed)
Received a call from [facility], home health aid that sees pt.   She stated Pt fell over week end and is c/o pain to  left wrist.  Pt states wheels on walker locked.  Denies LOC.   EMS came to house applied wrist splint. Area is bruised and swollen.  Pt on coumadin, and is due for INR check today. [facility]) We do NOT manage coumadin.  That is done on oncologist.  Scheduled appointment tomorrow for evaluation of wrist.  Pt could NOT come in today.  Appointment was offered. HHN will be out to see pt today.

## 2011-10-06 NOTE — ED Provider Notes (Signed)
History  This chart was scribed for Richardean Canal, MD by Bennett Scrape. This patient was seen in room TR09C/TR09C and the patient's care was started at 1619.  CSN: [id number]  Arrival date & time 10/06/11  1619   First MD Initiated Contact with Patient 10/06/11 1619      Chief Complaint  Patient presents with  . Wrist Pain     The history is provided by the patient. No language interpreter was used.   [name] is a 66 y.o. female brought in by ambulance, who presents to the Emergency Department complaining of 3 days of sudden onset, non-changing, constant left wrist pain that started after a fall when the wheels on her walker got stuck on a rug in her home. She denies head trauma or LOC. She reports that she had a carpectomy done by [name] on July 3rd, 2013 in the left hand after she got an infection from a prior carpel tunnel release surgery in the same hand several weeks before. She reports taking hydrocodone prescribed by [name] at home with moderate improvement in her symptoms. She is currently on Coumadin and reports that her INR level was 2.4 yesterday. She also c/o left knee pain but denies changes. She denies chills, fever, rash, nausea and emesis as associated symptoms. She has a h/o COPD, GERD, depression and breast CA. She is a former smoker but denies alcohol use.    Past Medical History  Diagnosis Date  . COPD (chronic obstructive pulmonary disease)   . Depression     followed by Dr. Gwyndolyn Kaufman, [facility]  . GERD (gastroesophageal reflux disease)   . Hypertension   . Hyperlipidemia   . Interstitial cystitis     followed by Dr. Isabel Caprice  . Irritable bowel syndrome     colonoscopy 12/2003 and EGD 1997 WNL, followed by Dr. Marina Goodell  . Spinal stenosis     L3-L4, L4-L5 per MRI 12/2004, status post lumbar decompression 02/2007 by Dr. Gerlene Fee  . Pulmonary embolism 10/2007    on coumadin and followed by Dr. Alexandria Lodge  . OSA on CPAP     noncompliance  . S/P mastectomy 1992    bilateral. status post reconstruction in 1994 by Dr. Benna Dunks  . S/P rhinoplasty 1993  . PONV (postoperative nausea and vomiting)   . Pneumonia   . Exertional dyspnea   . Basal cell carcinoma 09/2001    followed by Dr. Karlyn Agee SKIN CA  . Breast cancer     bilaterally  . Headache     "use to have them alot" (07/24/11)  . Arthritis     "everywhere"  . Chronic lower back pain   . Anxiety     Past Surgical History  Procedure Date  . Cervical discectomy 11/1995 and 05/2002    by Dr. Gerlene Fee  . Abdominal hysterectomy 1976  . Bladder suspension 1993  . Orif ankle fracture 1993  . Lumbar laminectomy/decompression microdiscectomy 02/2007    by Dr. Gerlene Fee  . Carpel   . Carpal tunnel release     LEFT  . Carpectomy 07/24/11    left wrist  . Fracture surgery   . Tonsillectomy and adenoidectomy 1953  . Appendectomy     "had it done; later told that they weren't gone"  . Dilation and curettage of uterus     "several of them"  . Tubal ligation   . Mastectomy 1992    "both of them are gone"  . Skin cancer excision     "  arms"  . Carpectomy 07/23/2011    Procedure: CARPECTOMY;  Surgeon: Sharma Covert, MD;  Location: [facility] OR;  Service: Orthopedics;  Laterality: Left;  LEFT WRIST PROXIMAL ROW CARPECTOMY    Family History  Problem Relation Age of Onset  . Heart disease Mother   . Cancer Brother     History  Substance Use Topics  . Smoking status: Former Smoker -- 0.2 packs/day for 40 years    Types: Cigarettes    Quit date: 05/21/2011  . Smokeless tobacco: Never Used  . Alcohol Use: No    No OB history provided.  Review of Systems  Constitutional: Negative for fever and chills.  Gastrointestinal: Negative for nausea and vomiting.  Musculoskeletal: Negative for back pain.       Positive for left wrist pain  Skin: Negative for rash.    Allergies  Adhesive; Chlorpromazine hcl; Codeine; Morphine; and Sulfonamide derivatives  Home Medications   Current Outpatient Rx  Name  Route Sig Dispense Refill  . FERROUS SULFATE 325 (65 FE) MG PO TBEC Oral Take 325 mg by mouth 3 (three) times daily with meals.    Marland Kitchen GABAPENTIN 100 MG PO CAPS Oral Take 100 mg by mouth 3 (three) times daily.      Marland Kitchen HYDROCODONE-ACETAMINOPHEN 10-325 MG PO TABS Oral Take 1 tablet by mouth every 8 (eight) hours as needed. For pain    . LAMOTRIGINE 100 MG PO TABS Oral Take 100 mg by mouth daily.    Marland Kitchen LORAZEPAM 1 MG PO TABS Oral Take 1-2 mg by mouth 2 (two) times daily. Take one tablet in morning and two tablets by mouth at bedtime    . PRAVASTATIN SODIUM 40 MG PO TABS Oral Take 40 mg by mouth daily.    . QUETIAPINE FUMARATE 200 MG PO TABS Oral Take 400 mg by mouth at bedtime.      . SERTRALINE HCL 100 MG PO TABS Oral Take 200 mg by mouth daily.      Marland Kitchen VITAMIN C 500 MG PO TABS Oral Take 1 tablet (500 mg total) by mouth daily. 90 tablet 0  . WARFARIN SODIUM 4 MG PO TABS Oral Take 4 mg by mouth as directed.      BP 133/65  Pulse 79  Temp 98 F (36.7 C) (Oral)  SpO2 95%  Physical Exam  Nursing note and vitals reviewed. Constitutional: She is oriented to person, place, and time. She appears well-developed and well-nourished. No distress.  HENT:  Head: Normocephalic and atraumatic.  Eyes: Conjunctivae normal and EOM are normal.  Neck: Neck supple. No tracheal deviation present.       No midline tenderness  Cardiovascular: Normal rate.   Pulmonary/Chest: Effort normal. No respiratory distress.  Musculoskeletal: She exhibits tenderness. She exhibits no edema.       Tenderness along the left forearm, post surgical scar on the dorsal side of the left wrist that is tenderness, decreased ROM from pain, 2+ pulses, good cap refill, decreased hand grasp due to pain, no tenderness to the left elbow  Neurological: She is alert and oriented to person, place, and time.  Skin: Skin is warm and dry.  Psychiatric: She has a normal mood and affect. Her behavior is normal.    ED Course  Procedures (including  critical care time)  DIAGNOSTIC STUDIES: None performed.   COORDINATION OF CARE: 1626-Discussed treatment plan which includes x-rays and one dose of hydrocodone with pt at bedside and pt agreed to plan.   Labs  Reviewed - No data to display Dg Forearm Left  10/06/2011  *RADIOLOGY REPORT*  Clinical Data: Left wrist and distal forearm pain and swelling post fall; history of reconstructive wrist surgery July 2013  LEFT FOREARM - 2 VIEW  Comparison: None  Findings: Osseous demineralization. Nondisplaced radial styloid fracture. Elbow joint alignment normal. Scattered soft tissue swelling. Question prior triquetral and scaphoid resections. No additional fracture dislocation identified.  IMPRESSION: Nondisplaced radial styloid fracture. Osseous demineralization. Question prior resections of the triquetrum and scaphoid.   Original Report Authenticated By: Lollie Marrow, M.D.    Dg Wrist Complete Left  10/06/2011  *RADIOLOGY REPORT*  Clinical Data: Post fall, now with left wrist pain; history of reconstructive left wrist surgery  LEFT WRIST - COMPLETE 3+ VIEW  Comparison: Left forearm radiographs - earlier same day  Findings:  Diffuse osteopenia.  There is an acute nondisplaced fracture of the distal radial epiphysis with extension to the radiocarpal articulation.  There is apparent resection of the proximal carpal row with absence of the scaphoid, lunate and triquetrum bones.  Osseous structures seen on the lateral radiograph may represent the sequela of remote pre resection triquetral fractures.  Additional small osseous structures within the palmar aspect adjacent to the remaining carpal row are indeterminate in etiology and chronicity.  Small osseous structure adjacent to the tip of the ulnar styloid process may represent age indeterminate ulnar styloid process fracture.  IMPRESSION: 1.  Osteopenia with minimally-displaced acute fracture of the distal radial epiphysis with extension to the radiocarpal  joint. 2.  Extensive postsurgical change of the wrist with resection of the proximal carpal row and multiple small osseous fragments about the remaining carpal row.  Given extensive postsurgical change and lack of prior comparison examinations, an additional acute on chronic injury is difficult to exclude.  Further evaluation with CT may be performed as clinically indicated.   Original Report Authenticated By: Waynard Reeds, M.D.      1. Radius fracture       MDM  IEASHA HNAT is a 66 y.o. female here with L wrist pain s/p fall. Xray showed distal radius fracture. I called Dr. Ranell Patrick, who is on call for [name], who recommended a sugar tongue splint and f/u in a week. I discussed it with the patient.     This document was completed by the scribe at my direction and I have reviewed its accuracy. I have personally examined the patient and agrees with the above document.   Chaney Malling, MD     Richardean Canal, MD 10/06/11 [phone]

## 2011-10-06 NOTE — ED Notes (Signed)
Pt waiting for [facility] transport. Pt provided snacks.

## 2011-10-06 NOTE — ED Notes (Signed)
Per ems- Pt fell on Saturday because wheels on walker locked up. Fell hurt left wrist, surgery to wrist in July. Swelling and bruising noted. C/o tingling. Radial pulse present, sensation intact. A x 4.

## 2011-10-06 NOTE — ED Notes (Signed)
[facility] called  

## 2011-10-07 ENCOUNTER — Encounter: Payer: PRIVATE HEALTH INSURANCE | Admitting: Internal Medicine

## 2011-10-07 ENCOUNTER — Ambulatory Visit: Payer: PRIVATE HEALTH INSURANCE | Admitting: Internal Medicine

## 2011-10-07 ENCOUNTER — Ambulatory Visit (HOSPITAL_BASED_OUTPATIENT_CLINIC_OR_DEPARTMENT_OTHER): Payer: PRIVATE HEALTH INSURANCE | Admitting: Pharmacist

## 2011-10-07 DIAGNOSIS — I824Y9 Acute embolism and thrombosis of unspecified deep veins of unspecified proximal lower extremity: Secondary | ICD-10-CM

## 2011-10-07 DIAGNOSIS — Z7901 Long term (current) use of anticoagulants: Secondary | ICD-10-CM

## 2011-10-07 NOTE — Patient Instructions (Signed)
Faxed instructions to [facility] to have HHN report to patient's house tomorrow to fill her pill box as described above, and for the RN to call the CC to report what doses (if any) of Coumadin were taken on Monday and Tuesday this week. Also faxed orders for repeat PT/INR on 10/13/11 (fax # [phone]) and for results to be both called in to phone # [phone] and faxed to fax# [phone].

## 2011-10-07 NOTE — Progress Notes (Signed)
INR therapeutic from 10/06/11 (2.9) after dose increase last week. Unable to speak to [facility] yesterday since she sent pt to the ER.  Pt fell over the weekend and broke her wrist again.  RN has requested for social work to come and do a home evaluation - she does not believe pt is fit to be living by herself.    Spoke with pt by phone to verify dose, compliance, and other bleeding/bruising/clotting issues. Pt denies problems with bleeding/bruising/ss of clotting.  She did not take Monday's dose because a Doctor told her that she should not have to take her Coumadin if her INR is therapeutic.  When I confronted her and said that this did not make sense... She echoed my words and said that she did not think that made sense.  Told patient to take 2mg  of Coumadin tomorrow.  Pt said she understood.  Faxed instructions to [facility] to have HHN report to patient's house tomorrow to fill her pill box as described above, and for the RN to call the CC to report what doses (if any) of Coumadin were taken on Monday and Tuesday this week.  Also faxed orders for repeat PT/INR on 10/13/11 (fax # [phone]) and for results to be both called in to phone # [phone] and faxed to fax# [phone].

## 2011-10-08 ENCOUNTER — Telehealth: Payer: Self-pay | Admitting: Pharmacist

## 2011-10-08 NOTE — Telephone Encounter (Signed)
Pt called since Home Health nurse did not come to her house to fill her pill box.   She wanted to know what dose of Coumadin she should take today.   I informed pt that she should take 4mg  today, and that hopefully she would get a visit from the nurse tomorrow.  If not, she should call back and find out what dose to take tomorrow.

## 2011-10-09 ENCOUNTER — Telehealth: Payer: Self-pay | Admitting: Pharmacist

## 2011-10-09 NOTE — Telephone Encounter (Signed)
Home Health RN called today to confirm she has filled [name]'s pill box as instructed; 4mg  daily except 2mg  on TuThuSat. She also stated that the patient missed her Coumadin dose on 9/16 and 9/17. Will recheck INR on 10/13/11.

## 2011-10-13 ENCOUNTER — Ambulatory Visit (HOSPITAL_BASED_OUTPATIENT_CLINIC_OR_DEPARTMENT_OTHER): Payer: PRIVATE HEALTH INSURANCE | Admitting: Pharmacist

## 2011-10-13 DIAGNOSIS — Z7901 Long term (current) use of anticoagulants: Secondary | ICD-10-CM

## 2011-10-13 DIAGNOSIS — I824Y9 Acute embolism and thrombosis of unspecified deep veins of unspecified proximal lower extremity: Secondary | ICD-10-CM

## 2011-10-13 LAB — POCT INR: INR: 2.3

## 2011-10-13 NOTE — Patient Instructions (Signed)
Continue 4mg  daily except 2mg  on Tu,Th,Sat Recheck INR in 1 week.

## 2011-10-13 NOTE — Progress Notes (Signed)
Continue 4mg  daily except 2mg  on Tu,Th,Sat Recheck INR in 1 week. Brooke, [facility] RN will be at pt home on 10/21/11. Spoke with Nehemiah Settle and she filled pt's pill box for the next week. (Brooke's phone = [phone])  Pt has a f/u apt with [name] on 10/14/11.

## 2011-10-21 ENCOUNTER — Telehealth: Payer: Self-pay | Admitting: *Deleted

## 2011-10-21 ENCOUNTER — Ambulatory Visit: Payer: Self-pay | Admitting: Pharmacist

## 2011-10-21 DIAGNOSIS — I824Y9 Acute embolism and thrombosis of unspecified deep veins of unspecified proximal lower extremity: Secondary | ICD-10-CM

## 2011-10-21 LAB — POCT INR: INR: 2.1

## 2011-10-21 NOTE — Telephone Encounter (Signed)
Call from [facility] with [facility] -  # [phone] Nurse is seeing pt in home and is asking for a VO for incontinent supplies. Will need a  Dx code. Also pt fell about 3 weeks ago and had a wrist Fx.  Nurse is asking for Occupational Therapy.  Will you approve this?

## 2011-10-21 NOTE — Telephone Encounter (Signed)
Pt has not been reassigned with new PCP.  Will need to have pt come in for OV to evaluated these needs. HHN informed.  I did suggest contacting oncology for these orders as pt has been seen by them recently  Will wait for return call from [name].[name]

## 2011-10-22 ENCOUNTER — Telehealth: Payer: Self-pay | Admitting: *Deleted

## 2011-10-22 NOTE — Telephone Encounter (Signed)
I am not familiar with this patient. Would like Urology to be contacted or for me to see the patient before i put in the orders.

## 2011-10-22 NOTE — Telephone Encounter (Signed)
Called and talked with [facility] and asked her to make appointment for pt to be seen in clinic to evaluate need for supplies and OT. She will call the patient.

## 2011-10-22 NOTE — Telephone Encounter (Signed)
Called [facility] and asked to have pt make an appointment in clinic to evaluate need for OT and supplies. She will call pt and set this up.

## 2011-10-22 NOTE — Telephone Encounter (Signed)
Rec'd call from Fredda Hammed with [facility] requesting orders for patient to receive incontinent supplies from them and requesting orders with Dx code. Dr Gaylyn Rong states he will not give orders for this, he was asked of this yesterday as well.  He is her Hematologist for the blood clot and he treats only her blood clots. Patient should be seen or have her primary MD or urologist for these such orders. Called and left message for [facility] @ [phone], ext [phone]. Will also forward message to primary care office.

## 2011-10-30 ENCOUNTER — Telehealth: Payer: Self-pay | Admitting: *Deleted

## 2011-10-30 NOTE — Telephone Encounter (Signed)
VM from Rn at [facility] states pt is being d/c'd from their services today d/t not having a skilled nursing need.

## 2011-10-31 ENCOUNTER — Ambulatory Visit: Payer: PRIVATE HEALTH INSURANCE | Admitting: Internal Medicine

## 2011-11-04 ENCOUNTER — Ambulatory Visit: Payer: PRIVATE HEALTH INSURANCE | Admitting: Internal Medicine

## 2011-11-04 NOTE — Progress Notes (Unsigned)
Subjective:     Patient ID: [name], female   DOB: 20-Sep-1945, 66 y.o.   MRN: [id number]  HPI [name] presents with with fever, nausea, and vomiting over the past 3 days. She is distressed and asks for [place] to "do something quick." She says she "alread had a cold coming on" when her symptoms started.Then three days ago, she broke a front tooth, and around the same time noticed an "abscess" on the roof of her mouth. Her tooth is not painful, but the abscess is tender. Over the past 3 days she has had fevers (measured up to 103 F) and chills. She has been nauseated and has vomited ~4 times per day. She has had little to eat or drink and feels unable to keep things down. She feels congested, has a runny nose, cough, and a headache. She notes some diarrhea. She has pain when she presses on the R side of her face. She also has some tenderness on the R side of her neck, and she has general body aches.  Review of Systems  Constitutional: Positive for fever, chills and appetite change.  HENT: Positive for congestion, rhinorrhea, neck pain, dental problem and sinus pressure. Negative for ear pain, sore throat and sneezing.   Respiratory: Positive for cough. Negative for choking and shortness of breath.   Cardiovascular: Negative for chest pain.  Gastrointestinal: Positive for nausea, vomiting and diarrhea. Negative for abdominal pain and blood in stool.  Genitourinary: Negative for dysuria and difficulty urinating.  Neurological: Positive for headaches. Negative for light-headedness.       Objective:   Physical Exam  Constitutional: She appears well-developed and well-nourished. She appears distressed.       Patient was lying with head in arms and rocking back and forth in distress.  HENT:  Head: Normocephalic and atraumatic.  Right Ear: Tympanic membrane, external ear and ear canal normal.  Left Ear: Tympanic membrane, external ear and ear canal normal.  Nose: Rhinorrhea present. No  mucosal edema. Right sinus exhibits maxillary sinus tenderness. Right sinus exhibits no frontal sinus tenderness. Left sinus exhibits no maxillary sinus tenderness and no frontal sinus tenderness.  Mouth/Throat: Mucous membranes are dry. Abnormal dentition. Dental abscesses present. No posterior oropharyngeal edema, posterior oropharyngeal erythema or tonsillar abscesses.    Eyes: Pupils are equal, round, and reactive to light.  Neck: Normal range of motion. Neck supple.       Palpable R submandibular lymph node, not palpable on L.  Cardiovascular: Regular rhythm, normal heart sounds and intact distal pulses.        Tachycardia.  Pulmonary/Chest: Effort normal and breath sounds normal. No stridor. No respiratory distress. She has no wheezes. She has no rales.  Abdominal: Soft. There is no tenderness.  Musculoskeletal: She exhibits no edema.       Assessment:     1. Mouth pain, fever, chills, nausea, vomiting - Symptoms and exam are consistent with a dental abscess on the R hard palate. Acute sinusitis, either bacterial or viral, is also possible, given her R maxillary tenderness, congestion, rhinorrhea, and headache. The cough may be a result of postnasal drip. The differential diagnosis includes acute bronchitis, bacterial or viral. The patient's inability to tolerate good oral hydration and diet complicated a potential plan to send her home with oral clindamycin. She currently shows signs of mild/moderate dehydration and is in distress, warranting an admission.     Plan:     1. Dental abscess and likely sinusitis - The  patient will be admitted directly for IV hydration and antibiotics. She should also be seen by a dentist for both her broken tooth and abscess.

## 2011-11-04 NOTE — Patient Instructions (Signed)
You will be admitted to hospital today for IV fluids and antibiotics. You should see a dentist for your broken tooth and abscess.

## 2011-11-11 ENCOUNTER — Other Ambulatory Visit: Payer: Self-pay | Admitting: *Deleted

## 2011-11-11 DIAGNOSIS — I82409 Acute embolism and thrombosis of unspecified deep veins of unspecified lower extremity: Secondary | ICD-10-CM

## 2011-11-11 MED ORDER — WARFARIN SODIUM 4 MG PO TABS: 4.0000 mg | ORAL_TABLET | ORAL | Status: DC

## 2011-11-14 ENCOUNTER — Other Ambulatory Visit (HOSPITAL_BASED_OUTPATIENT_CLINIC_OR_DEPARTMENT_OTHER): Payer: PRIVATE HEALTH INSURANCE

## 2011-11-14 ENCOUNTER — Ambulatory Visit (HOSPITAL_BASED_OUTPATIENT_CLINIC_OR_DEPARTMENT_OTHER): Payer: PRIVATE HEALTH INSURANCE | Admitting: Pharmacist

## 2011-11-14 ENCOUNTER — Telehealth: Payer: Self-pay | Admitting: Oncology

## 2011-11-14 DIAGNOSIS — I824Y9 Acute embolism and thrombosis of unspecified deep veins of unspecified proximal lower extremity: Secondary | ICD-10-CM

## 2011-11-14 DIAGNOSIS — I2699 Other pulmonary embolism without acute cor pulmonale: Secondary | ICD-10-CM

## 2011-11-14 DIAGNOSIS — Z7901 Long term (current) use of anticoagulants: Secondary | ICD-10-CM

## 2011-11-14 LAB — POCT INR: INR: 2.2

## 2011-11-14 LAB — PROTIME-INR: Protime: 26.4 Seconds — ABNORMAL HIGH (ref 10.6–13.4)

## 2011-11-14 NOTE — Progress Notes (Signed)
Pt very tearful during appt.  She denied wanting to hurt herself or suicidal ideation.  She stated that she is tired and doesn't understand why [facility] can't come to her home to check her PT/INR.  INR at goal on 2mg  Tu/Th/Sat and 4mg  other days.  Will continue this dose and check PT/INR in 3 weeks.  Pt has appt with new PCP at [facility] and a f/u appt with orthopedics coming up prior to next Coumadin clinic appt.  I will ask our social workers at [facility] to contact pt and assess her needs.

## 2011-11-14 NOTE — Telephone Encounter (Signed)
Scheduled appt for coumadin

## 2011-11-18 ENCOUNTER — Encounter: Payer: Self-pay | Admitting: *Deleted

## 2011-11-18 NOTE — Progress Notes (Signed)
[facility] Brief Psychosocial Assessment Clinical Social Work  Clinical Social Work was referred by Merlene Laughter, PHARMD, for assessment of psychosocial needs.  Clinical Social Worker contacted patient at home to offer support and assess for needs.    Ms. Manthe shared that she lives alone and is wheelchair bound.  She states she has many limitations and feels that she "needs more help".  Currently, patient has CNA that provides care in the home several days a week.  Patient states [facility] reports she is not eligible for skilled nursing care, which includes checking her PT/INR.  She states she really values the care she receives by staff at the cancer center and feels very safe when meeting with the Coumadin Clinic staff.  She reports her biggest stressor is "getting to and from appointments".  Currently, Ms. Shropshire uses [facility] transportation for all medical appointments.  She is set up to see a new PCP at [facility] next week, CSW encouraged patient to discuss home care needs with her PCP office.   Patient reports symptoms of depression mainly due to her ability to "get outside and get fresh air".  She has requested motorized wheelchair, but is waiting approval from [facility].  Patient is seen by therapist and states she has positive relationship with therapist, she is scheduled to see her in two weeks.  Ms. Teodoro briefly mentioned relationships with neighbors, but has difficulty getting in and out of her house.   Clinical Social Work interventions:  At this time, patient already has CNA in home, CSW unaware of other [facility] services available.  CSW strongly encouraged patient to discuss needs with PCP for management of symptoms/resources.  Patient only being seen by Dr. Gaylyn Rong every 6 months.  Patient plans to contact CSW after PCP appointment to follow up.  Kathrin Penner, MSW, LCSW Clinical Social Worker [facility] [phone]

## 2011-12-01 ENCOUNTER — Ambulatory Visit: Payer: PRIVATE HEALTH INSURANCE | Admitting: Internal Medicine

## 2011-12-12 ENCOUNTER — Other Ambulatory Visit: Payer: PRIVATE HEALTH INSURANCE | Admitting: Lab

## 2011-12-12 ENCOUNTER — Ambulatory Visit: Payer: PRIVATE HEALTH INSURANCE

## 2011-12-12 ENCOUNTER — Telehealth: Payer: Self-pay | Admitting: Pharmacist

## 2011-12-12 NOTE — Telephone Encounter (Signed)
Pt missed coumadin appointment today. Called and spoke with patient about rescheduling. She stated she did not have today's appointment on her schedule and that transportation is an issue for her. She is unavailable next week to come in due to Thanksgiving. Pt rescheduled appointment for Wed 12/24/11 with lab at 1:30pm and coumadin clinic at 1:45pm.  CE

## 2011-12-16 ENCOUNTER — Telehealth: Payer: Self-pay | Admitting: *Deleted

## 2011-12-16 NOTE — Telephone Encounter (Signed)
Pt left VM about her appt on 12/24/11 and something with Transportation.   Called pt back to clarify if she needs to r/s this appt?  Left Vm on her answering machine instructed her to call back and ask for Coumadin Clinic if she needs to r/s this appt.

## 2011-12-22 ENCOUNTER — Encounter (HOSPITAL_COMMUNITY): Payer: Self-pay | Admitting: Emergency Medicine

## 2011-12-22 ENCOUNTER — Emergency Department (HOSPITAL_COMMUNITY): Payer: PRIVATE HEALTH INSURANCE

## 2011-12-22 ENCOUNTER — Inpatient Hospital Stay (HOSPITAL_COMMUNITY)
Admission: EM | Admit: 2011-12-22 | Discharge: 2011-12-29 | DRG: 378 | Disposition: A | Discharge status: Home Health | Payer: PRIVATE HEALTH INSURANCE | Attending: Internal Medicine | Admitting: Internal Medicine

## 2011-12-22 DIAGNOSIS — D649 Anemia, unspecified: Secondary | ICD-10-CM

## 2011-12-22 DIAGNOSIS — I509 Heart failure, unspecified: Secondary | ICD-10-CM | POA: Diagnosis present

## 2011-12-22 DIAGNOSIS — R5381 Other malaise: Secondary | ICD-10-CM | POA: Diagnosis present

## 2011-12-22 DIAGNOSIS — J4489 Other specified chronic obstructive pulmonary disease: Secondary | ICD-10-CM | POA: Diagnosis present

## 2011-12-22 DIAGNOSIS — J8410 Pulmonary fibrosis, unspecified: Secondary | ICD-10-CM | POA: Diagnosis present

## 2011-12-22 DIAGNOSIS — E876 Hypokalemia: Secondary | ICD-10-CM | POA: Diagnosis present

## 2011-12-22 DIAGNOSIS — K219 Gastro-esophageal reflux disease without esophagitis: Secondary | ICD-10-CM | POA: Diagnosis present

## 2011-12-22 DIAGNOSIS — Z86718 Personal history of other venous thrombosis and embolism: Secondary | ICD-10-CM

## 2011-12-22 DIAGNOSIS — K589 Irritable bowel syndrome without diarrhea: Secondary | ICD-10-CM | POA: Diagnosis present

## 2011-12-22 DIAGNOSIS — D62 Acute posthemorrhagic anemia: Secondary | ICD-10-CM | POA: Diagnosis present

## 2011-12-22 DIAGNOSIS — J9610 Chronic respiratory failure, unspecified whether with hypoxia or hypercapnia: Secondary | ICD-10-CM | POA: Diagnosis present

## 2011-12-22 DIAGNOSIS — Z79899 Other long term (current) drug therapy: Secondary | ICD-10-CM

## 2011-12-22 DIAGNOSIS — R5383 Other fatigue: Secondary | ICD-10-CM

## 2011-12-22 DIAGNOSIS — F339 Major depressive disorder, recurrent, unspecified: Secondary | ICD-10-CM | POA: Diagnosis present

## 2011-12-22 DIAGNOSIS — B9620 Unspecified Escherichia coli [E. coli] as the cause of diseases classified elsewhere: Secondary | ICD-10-CM | POA: Diagnosis present

## 2011-12-22 DIAGNOSIS — Z6841 Body Mass Index (BMI) 40.0 and over, adult: Secondary | ICD-10-CM

## 2011-12-22 DIAGNOSIS — I10 Essential (primary) hypertension: Secondary | ICD-10-CM | POA: Diagnosis present

## 2011-12-22 DIAGNOSIS — A498 Other bacterial infections of unspecified site: Secondary | ICD-10-CM | POA: Diagnosis present

## 2011-12-22 DIAGNOSIS — E875 Hyperkalemia: Secondary | ICD-10-CM | POA: Diagnosis not present

## 2011-12-22 DIAGNOSIS — I82402 Acute embolism and thrombosis of unspecified deep veins of left lower extremity: Secondary | ICD-10-CM | POA: Diagnosis present

## 2011-12-22 DIAGNOSIS — Z7901 Long term (current) use of anticoagulants: Secondary | ICD-10-CM

## 2011-12-22 DIAGNOSIS — G4733 Obstructive sleep apnea (adult) (pediatric): Secondary | ICD-10-CM | POA: Diagnosis present

## 2011-12-22 DIAGNOSIS — Z22322 Carrier or suspected carrier of Methicillin resistant Staphylococcus aureus: Secondary | ICD-10-CM

## 2011-12-22 DIAGNOSIS — Z853 Personal history of malignant neoplasm of breast: Secondary | ICD-10-CM

## 2011-12-22 DIAGNOSIS — E785 Hyperlipidemia, unspecified: Secondary | ICD-10-CM | POA: Diagnosis present

## 2011-12-22 DIAGNOSIS — K31811 Angiodysplasia of stomach and duodenum with bleeding: Principal | ICD-10-CM | POA: Diagnosis present

## 2011-12-22 DIAGNOSIS — N3010 Interstitial cystitis (chronic) without hematuria: Secondary | ICD-10-CM | POA: Diagnosis present

## 2011-12-22 DIAGNOSIS — K31819 Angiodysplasia of stomach and duodenum without bleeding: Secondary | ICD-10-CM | POA: Diagnosis present

## 2011-12-22 DIAGNOSIS — M129 Arthropathy, unspecified: Secondary | ICD-10-CM | POA: Diagnosis present

## 2011-12-22 DIAGNOSIS — Z86711 Personal history of pulmonary embolism: Secondary | ICD-10-CM

## 2011-12-22 DIAGNOSIS — I82409 Acute embolism and thrombosis of unspecified deep veins of unspecified lower extremity: Secondary | ICD-10-CM

## 2011-12-22 DIAGNOSIS — F3289 Other specified depressive episodes: Secondary | ICD-10-CM | POA: Diagnosis present

## 2011-12-22 DIAGNOSIS — Z720 Tobacco use: Secondary | ICD-10-CM | POA: Diagnosis present

## 2011-12-22 DIAGNOSIS — F329 Major depressive disorder, single episode, unspecified: Secondary | ICD-10-CM | POA: Diagnosis present

## 2011-12-22 DIAGNOSIS — R0602 Shortness of breath: Secondary | ICD-10-CM | POA: Diagnosis present

## 2011-12-22 DIAGNOSIS — F411 Generalized anxiety disorder: Secondary | ICD-10-CM | POA: Diagnosis present

## 2011-12-22 DIAGNOSIS — Z87891 Personal history of nicotine dependence: Secondary | ICD-10-CM

## 2011-12-22 DIAGNOSIS — J449 Chronic obstructive pulmonary disease, unspecified: Secondary | ICD-10-CM | POA: Diagnosis present

## 2011-12-22 DIAGNOSIS — Z85828 Personal history of other malignant neoplasm of skin: Secondary | ICD-10-CM

## 2011-12-22 HISTORY — DX: Fibromyalgia: M79.7

## 2011-12-22 LAB — CBC
Hemoglobin: 4.8 g/dL — CL (ref 12.0–15.0)
MCH: 17.3 pg — ABNORMAL LOW (ref 26.0–34.0)
MCHC: 26.4 g/dL — ABNORMAL LOW (ref 30.0–36.0)
RDW: 19.9 % — ABNORMAL HIGH (ref 11.5–15.5)

## 2011-12-22 LAB — POCT I-STAT, CHEM 8
Calcium, Ion: 1.06 mmol/L — ABNORMAL LOW (ref 1.13–1.30)
Creatinine, Ser: 0.8 mg/dL (ref 0.50–1.10)
Glucose, Bld: 160 mg/dL — ABNORMAL HIGH (ref 70–99)
Hemoglobin: 6.1 g/dL — CL (ref 12.0–15.0)
TCO2: 27 mmol/L (ref 0–100)

## 2011-12-22 LAB — BASIC METABOLIC PANEL
BUN: 14 mg/dL (ref 6–23)
Calcium: 8.6 mg/dL (ref 8.4–10.5)
GFR calc Af Amer: >90 mL/min (ref >90)
GFR calc non Af Amer: 85 mL/min — ABNORMAL LOW (ref >90)
Glucose, Bld: 151 mg/dL — ABNORMAL HIGH (ref 70–99)
Sodium: 139 mEq/L (ref 135–145)

## 2011-12-22 NOTE — ED Notes (Signed)
[facility] was called, notified of pt's transfer to [facility], report on pt given.

## 2011-12-22 NOTE — ED Provider Notes (Signed)
History     CSN: [id number]  Arrival date & time 12/22/11  1513   First MD Initiated Contact with Patient 12/22/11 1803      Chief Complaint  Patient presents with  . Shortness of Breath     Patient is a 66 y.o. female presenting with shortness of breath. The history is provided by the patient.  Shortness of Breath  The current episode started yesterday. The onset was gradual. The problem occurs continuously. The problem has been gradually worsening. The problem is moderate. The symptoms are relieved by beta-agonist inhalers and rest. The symptoms are aggravated by activity. Associated symptoms include cough and shortness of breath. Pertinent negatives include no chest pain and no fever.  pt presents for cough, shortness of breath, fatigue  She reports dyspnea on exertion She reports some improvement with nebs from EMS  Past Medical History  Diagnosis Date  . COPD (chronic obstructive pulmonary disease)   . Depression     followed by Dr. Gwyndolyn Kaufman, [facility]  . GERD (gastroesophageal reflux disease)   . Hypertension   . Hyperlipidemia   . Interstitial cystitis     followed by Dr. Isabel Caprice  . Irritable bowel syndrome     colonoscopy 12/2003 and EGD 1997 WNL, followed by Dr. Marina Goodell  . Spinal stenosis     L3-L4, L4-L5 per MRI 12/2004, status post lumbar decompression 02/2007 by Dr. Gerlene Fee  . Pulmonary embolism 10/2007    on coumadin and followed by Dr. Alexandria Lodge  . OSA on CPAP     noncompliance  . S/P mastectomy 1992    bilateral. status post reconstruction in 1994 by Dr. Benna Dunks  . S/P rhinoplasty 1993  . PONV (postoperative nausea and vomiting)   . Pneumonia   . Exertional dyspnea   . Basal cell carcinoma 09/2001    followed by Dr. Karlyn Agee SKIN CA  . Breast cancer     bilaterally  . Headache     "use to have them alot" (07/24/11)  . Arthritis     "everywhere"  . Chronic lower back pain   . Anxiety     Past Surgical History  Procedure Date  . Cervical discectomy 11/1995 and  05/2002    by Dr. Gerlene Fee  . Abdominal hysterectomy 1976  . Bladder suspension 1993  . Orif ankle fracture 1993  . Lumbar laminectomy/decompression microdiscectomy 02/2007    by Dr. Gerlene Fee  . Carpel   . Carpal tunnel release     LEFT  . Carpectomy 07/24/11    left wrist  . Fracture surgery   . Tonsillectomy and adenoidectomy 1953  . Appendectomy     "had it done; later told that they weren't gone"  . Dilation and curettage of uterus     "several of them"  . Tubal ligation   . Mastectomy 1992    "both of them are gone"  . Skin cancer excision     "arms"  . Carpectomy 07/23/2011    Procedure: CARPECTOMY;  Surgeon: Sharma Covert, MD;  Location: [facility] OR;  Service: Orthopedics;  Laterality: Left;  LEFT WRIST PROXIMAL ROW CARPECTOMY    Family History  Problem Relation Age of Onset  . Heart disease Mother   . Cancer Brother     History  Substance Use Topics  . Smoking status: Former Smoker -- 0.2 packs/day for 40 years    Types: Cigarettes    Quit date: 05/21/2011  . Smokeless tobacco: Never Used  . Alcohol Use: No  OB History    Grav Para Term Preterm Abortions TAB SAB Ect Mult Living                  Review of Systems  Constitutional: Positive for fatigue. Negative for fever.  Respiratory: Positive for cough and shortness of breath.   Cardiovascular: Negative for chest pain.  Gastrointestinal: Negative for abdominal pain and blood in stool.  Genitourinary: Negative for vaginal bleeding.  Musculoskeletal: Negative for back pain.  Skin: Positive for pallor.  Neurological: Positive for weakness.  Psychiatric/Behavioral: Negative for agitation.  All other systems reviewed and are negative.    Allergies  Adhesive; Chlorpromazine hcl; Codeine; Morphine; and Sulfonamide derivatives  Home Medications   Current Outpatient Rx  Name  Route  Sig  Dispense  Refill  . FERROUS SULFATE 325 (65 FE) MG PO TBEC   Oral   Take 325 mg by mouth 3 (three) times daily with  meals.         Marland Kitchen GABAPENTIN 100 MG PO CAPS   Oral   Take 100 mg by mouth 3 (three) times daily.           Marland Kitchen HYDROCODONE-ACETAMINOPHEN 10-325 MG PO TABS   Oral   Take 1 tablet by mouth every 8 (eight) hours as needed. For pain         . LAMOTRIGINE 200 MG PO TABS   Oral   Take 200 mg by mouth every morning.         Marland Kitchen LORAZEPAM 1 MG PO TABS   Oral   Take 1-2 mg by mouth 2 (two) times daily. Take one tablet in morning and two tablets by mouth at bedtime         . OMEPRAZOLE 20 MG PO CPDR   Oral   Take 20 mg by mouth daily.         Marland Kitchen PRAVASTATIN SODIUM 40 MG PO TABS   Oral   Take 40 mg by mouth daily.         . QUETIAPINE FUMARATE 200 MG PO TABS   Oral   Take 400 mg by mouth at bedtime.           . SERTRALINE HCL 100 MG PO TABS   Oral   Take 200 mg by mouth daily.           Marland Kitchen VITAMIN C 500 MG PO TABS   Oral   Take 1 tablet (500 mg total) by mouth daily.   90 tablet   0   . WARFARIN SODIUM 4 MG PO TABS   Oral   Take 2-4 mg by mouth daily. Takes 1/2 tab(2mg ) every other day alternating with 1 tablet(4mg )           BP 133/45  Pulse 81  Temp 99 F (37.2 C) (Oral)  Resp 20  SpO2 100%  Physical Exam CONSTITUTIONAL: Well developed/well nourished HEAD AND FACE: Normocephalic/atraumatic EYES: EOMI/PERRL ENMT: Mucous membranes moist NECK: supple no meningeal signs SPINE:entire spine nontender CV: S1/S2 noted, no murmurs/rubs/gallops noted LUNGS: coarse BS noted bilaterally but she is able to speak to me clearly ABDOMEN: soft, nontender, no rebound or guarding Rectal - stool color normal, no blood or melena.  Hemoccult neg.  Chaperone present GU:no cva tenderness NEURO: Pt is awake/alert, moves all extremitiesx4 EXTREMITIES: pulses normal, full ROM SKIN: pale PSYCH: no abnormalities of mood noted  ED Course  Procedures  CRITICAL CARE Performed by: Joya Gaskins   Total critical care time:  32  Critical care time was exclusive of  separately billable procedures and treating other patients.  Critical care was necessary to treat or prevent imminent or life-threatening deterioration.  Critical care was time spent personally by me on the following activities: development of treatment plan with patient and/or surrogate as well as nursing, discussions with consultants, evaluation of patient's response to treatment, examination of patient, obtaining history from patient or surrogate, ordering and performing treatments and interventions, ordering and review of laboratory studies, ordering and review of radiographic studies, pulse oximetry and re-evaluation of patient's condition.   Labs Reviewed  CBC - Abnormal; Notable for the following:    WBC 12.6 (*)     RBC 2.77 (*)     Hemoglobin 4.8 (*)     HCT 18.2 (*)     MCV 65.7 (*)     MCH 17.3 (*)     MCHC 26.4 (*)     RDW 19.9 (*)     All other components within normal limits  BASIC METABOLIC PANEL - Abnormal; Notable for the following:    Potassium 3.0 (*)     Glucose, Bld 151 (*)     GFR calc non Af Amer 85 (*)     All other components within normal limits  POCT I-STAT TROPONIN I   Dg Chest 2 View (if Patient Has Fever And/or Copd)  12/22/2011  *RADIOLOGY REPORT*  Clinical Data: Chest pain, shortness of breath, cough  CHEST - 2 VIEW  Comparison: 05/01/2011  Findings: Worsening diffuse airspace disease versus edema throughout both lungs.  Heart is enlarged.  Central vascularity prominent.  CHF is favored.  No large effusion or pneumothorax.  IMPRESSION: Worsening diffuse airspace process versus edema.  CHF is favored.   Original Report Authenticated By: Judie Petit. Shick, M.D.     9:07 PM Pt here for SOB/fatigue.  She did have coarse BS noted She reports improvement with nebs from EMS However she is severely anemia (HGB less than 5) She is pale and reports fatigue/dyspnea on exertion She even has some EKG changes which could be due to anemia chf also noted on CXR Will need  admission for further evaluation/management She agrees to receive blood transfusion.  Will administer one unit No signs of acute GI bleed.  She is on coumadin but is therapeutic D/w OPC will admit (she belongs to their service) Pt stabilized in the ED    MDM  Nursing notes including past medical history and social history reviewed and considered in documentation xrays reviewed and considered Labs/vital reviewed and considered        Date: 12/22/2011  Rate: 83  Rhythm: normal sinus rhythm  QRS Axis: normal  Intervals: normal  ST/T Wave abnormalities: diffuse ST depression noted  Conduction Disutrbances:none  Narrative Interpretation:   Old EKG Reviewed: changes noted and diffuse ST depression noted    Joya Gaskins, MD 12/22/11 2110

## 2011-12-22 NOTE — ED Notes (Signed)
Per EMS: Pt c/o shortness of breath that began last night. She has had two respiratory treatments 0.5 mg of Atrovent and 5 mg of albuterol each and 125 mg of solumedrol by EMS.

## 2011-12-22 NOTE — H&P (Signed)
Hospital Admission Note Date: 12/22/2011  Patient name: [name] Medical record number: [id number] Date of birth: 06-23-1945 Age: 66 y.o. Gender: female PCP: Rocco Serene, MD  Medical Service: Internal Medicine Teaching Service  Attending physician:  Dr. Eben Burow   1st Contact: Dr. Shirlee Latch   Pager: [phone] 2nd Contact: Dr. 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 She has chest pain associated with coughing only. She denies any PND or orthopnea, but has 2-pillow orthopnea. She is on home oxygen 2L and has not increased her O2.  In addition she has had chronic urge incontinence for years but recently has had burning sensation with urination. Her appetite has been decreased for the past 3 days.   She states that her stool is brown.  Denies N/V, abdominal pain, melena, hematemesis, hematochezia.  She denies any change in bowel movement pattern, she moves her bowel once daily. No diarrhea or constipation. No recent weightloss.  Denies any NSAIDs or steroid use.  She has GERD and is taking PPI.     In the ED, patient was found to have Hb of 4.8 and repeat was 6.1.  Meds: Current Outpatient Rx  Name  Route  Sig  Dispense  Refill  . FERROUS SULFATE 325 (65 FE) MG PO TBEC   Oral   Take 325 mg by mouth 3 (three) times daily with meals.         Marland Kitchen GABAPENTIN 100 MG PO CAPS   Oral   Take 100 mg by mouth 3 (three) times daily.          Marland Kitchen HYDROCODONE-ACETAMINOPHEN 10-325 MG PO TABS   Oral   Take 1 tablet by mouth every 8 (eight) hours as needed. For pain         . LAMOTRIGINE 200 MG PO TABS   Oral   Take 200 mg by mouth every morning.         Marland Kitchen LORAZEPAM 1 MG PO TABS   Oral   Take 1-2 mg by mouth 2 (two) times daily. Take one tablet in morning and two tablets by mouth at bedtime         . OMEPRAZOLE 20 MG PO CPDR   Oral   Take 20 mg by mouth daily.         Marland Kitchen PRAVASTATIN SODIUM 40 MG PO TABS   Oral   Take 40 mg by mouth daily.         . QUETIAPINE FUMARATE 200 MG PO TABS   Oral   Take 400 mg by mouth at bedtime.           . SERTRALINE HCL 100 MG PO TABS   Oral   Take 200 mg by mouth daily.           Marland Kitchen VITAMIN C 500 MG PO TABS   Oral   Take 1 tablet (500 mg total) by  mouth daily.   90 tablet   0   . WARFARIN SODIUM 4 MG PO TABS   Oral   Take 2-4 mg by mouth daily. Takes 1/2 tab(2mg ) every other day alternating with 1 tablet(4mg )           Allergies: Allergies as of 12/22/2011 - Review Complete 12/22/2011  Allergen Reaction Noted  . Adhesive (tape) Other (See Comments) 07/03/2011  . Chlorpromazine hcl Nausea And Vomiting 04/07/2006  . Codeine Other (See Comments)   . Morphine Other (See Comments) 12/13/2007  . Sulfonamide derivatives Nausea And Vomiting 12/26/2005   Past Medical History  Diagnosis Date  . COPD (chronic obstructive pulmonary disease)   . Depression     followed by Dr. Gwyndolyn Kaufman, [facility]  . GERD (gastroesophageal reflux disease)   . Hypertension   . Hyperlipidemia   . Interstitial cystitis     followed by Dr. Isabel Caprice  . Irritable bowel syndrome     colonoscopy 12/2003 and EGD 1997 WNL, followed by Dr. Marina Goodell  . Spinal stenosis     L3-L4, L4-L5 per MRI 12/2004, status post lumbar decompression 02/2007 by Dr. Gerlene Fee  . Pulmonary embolism 10/2007    on coumadin and followed by Dr. Alexandria Lodge  . OSA on CPAP     noncompliance  . S/P mastectomy 1992    bilateral.  status post reconstruction in 1994 by Dr. Benna Dunks  . S/P rhinoplasty 1993  . PONV (postoperative nausea and vomiting)   . Pneumonia   . Exertional dyspnea   . Basal cell carcinoma 09/2001    followed by Dr. Karlyn Agee SKIN CA  . Breast cancer     bilaterally  . Headache     "use to have them alot" (07/24/11)  . Arthritis     "everywhere"  . Chronic lower back pain   . Anxiety    Past Surgical History  Procedure Date  . Cervical discectomy 11/1995 and 05/2002    by Dr. Gerlene Fee  . Abdominal hysterectomy 1976  . Bladder suspension 1993  . Orif ankle fracture 1993  . Lumbar laminectomy/decompression microdiscectomy 02/2007    by Dr. Gerlene Fee  . Carpel   . Carpal tunnel release     LEFT  . Carpectomy 07/24/11    left wrist  . Fracture surgery   . Tonsillectomy and adenoidectomy 1953  . Appendectomy     "had it done; later told that they weren't gone"  . Dilation and curettage of uterus     "several of them"  . Tubal ligation   . Mastectomy 1992    "both of them are gone"  . Skin cancer excision     "arms"  . Carpectomy 07/23/2011    Procedure: CARPECTOMY;  Surgeon: Sharma Covert, MD;  Location: [facility] OR;  Service: Orthopedics;  Laterality: Left;  LEFT WRIST PROXIMAL ROW CARPECTOMY   Family History  Problem Relation Age of Onset  . Heart disease Mother   . Cancer Brother    History   Social History  . Marital Status: Single    Spouse Name: N/A    Number of Children: N/A  . Years of Education: N/A   Occupational History  . Not on file.   Social History Main Topics  . Smoking status: Former Smoker -- 0.2 packs/day for 40 years    Types: Cigarettes    Quit date: 05/21/2011  . Smokeless tobacco: Never Used  . Alcohol Use: No  . Drug Use: No  . Sexually Active: No  Comment: QUIT ONE MONTH AGO   Other Topics Concern  . Not on file   Social History Narrative  . No narrative on file    Review of Systems: Constitutional: + fever, +chills, +diaphoresis, +appetite  change and fatigue.  HEENT: Denies photophobia, eye pain, redness, hearing loss, ear pain, congestion, sore throat, rhinorrhea, sneezing, mouth sores, trouble swallowing, neck pain, neck stiffness and tinnitus.  Respiratory: + SOB, +DOE,+ cough, chest tightness, and +wheezing.  Cardiovascular: Denies chest pain, palpitations and+ leg swelling.  Gastrointestinal: Denies nausea, vomiting, abdominal pain, diarrhea, constipation, blood in stool and abdominal distention.  Genitourinary: +dysuria, urgency, frequency, hematuria, flank pain and difficulty urinating, +urinary incontinence.  Musculoskeletal: Denies myalgias, back pain, joint swelling, arthralgias and gait problem.  Skin: Denies pallor, rash and wound.  Neurological: Denies dizziness, seizures, syncope, +weakness, light-headedness, numbness and headaches.  Hematological: Denies adenopathy. Easy bruising, personal or family bleeding history  Psychiatric/Behavioral: Denies suicidal ideation, mood changes, confusion,+nervousness, sleep disturbance and agitation  Physical Exam: Blood pressure 125/76, pulse 75, temperature 98.2 F (36.8 C), temperature source Oral, resp. rate 16, SpO2 96.00%. General: alert, morbidly obese, facial pallor, and cooperative to examination., tearful during examination  Head: normocephalic and atraumatic.  Eyes: vision grossly intact, pupils equal, pupils round, pupils reactive to light, no injection and anicteric.  Mouth: pharynx pink and moist, no erythema, and no exudates.  Neck: supple, full ROM, no thyromegaly, no JVD, and no carotid bruits.  Lungs: normal respiratory effort, no accessory muscle use, coarse breath sounds, + crisp, fine crackles, and +wheezes. Heart: tachycardic, regular rhythm, no murmur, no gallop, and no rub.  Abdomen:obese, soft, non-tender, normal bowel sounds, no distention, no guarding, no rebound tenderness, no hepatomegaly, and no splenomegaly.  Msk: no joint swelling, no joint  warmth, and no redness over joints.  Pulses: 2+ DP/PT pulses bilaterally Extremities: No cyanosis, clubbing, +1 pitting edema up to knees bilaterally Neurologic: alert & oriented X3, cranial nerves II-XII intact, strength normal in all extremities, sensation intact to light touch Skin: turgor normal and no rashes.  Psych: Oriented X3, memory intact for recent and remote, normally interactive, good eye contact, +anxious appearing, and+ depressed appearing, tearful   Lab results: Basic Metabolic Panel:  Basename 12/22/11 1948 12/22/11 1700  NA 140 139  K 3.2* 3.0*  CL 103 101  CO2 -- 28  GLUCOSE 160* 151*  BUN 15 14  CREATININE 0.80 0.76  CALCIUM -- 8.6  MG -- --  PHOS -- --   CBC:  Basename 12/22/11 1948 12/22/11 1700  WBC -- 12.6*  NEUTROABS -- --  HGB 6.1* 4.8*  HCT 18.0* 18.2*  MCV -- 65.7*  PLT -- 344   Coagulation:  Basename 12/22/11 1700  LABPROT 23.8*  INR 2.24*   Imaging results:  Dg Chest 2 View (if Patient Has Fever And/or Copd)  12/22/2011  *RADIOLOGY REPORT*  Clinical Data: Chest pain, shortness of breath, cough  CHEST - 2 VIEW  Comparison: 05/01/2011  Findings: Worsening diffuse airspace disease versus edema throughout both lungs.  Heart is enlarged.  Central vascularity prominent.  CHF is favored.  No large effusion or pneumothorax.  IMPRESSION: Worsening diffuse airspace process versus edema.  CHF is favored.   Original Report Authenticated By: Judie Petit. Miles Costain, M.D.     Other results: EKG: sinus rhythm, ST depression in inferior leads and V2-V4,  QTc 484 ms  Assessment & Plan by Problem:  1. Anemia. Likely iron deficiency aemia: Unclear etiology at this time, could be acute,  chronic, or acute on chronic.  Microcytic with MCV of 65.7, Hb 4.8 on admission with repeat of 6.1.  Her Hb has been trending down steadily from 12 to 10 and then 8 in July this year.  She is on anticoagulation for her recurrent PE/DVT.  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  She had argon photocoagulation performed and her Hb remained stable.  She then had a colonoscopy as outpatient in 09/2008 which showed diverticula and medium hemorrhoids. Her anemia panel on 06/2007 showed ferritin <1, sat ratio 3, iron 11.  She has been on iron supplement but reports taking it as once daily instead of TID. GAVE is an uncommon cause of upper GI bleed but could explain her symptoms and clinical picture as it most often causes chronic bleed with iron deficiency anemia. FOBT was negative at [facility] ED. Patient received 1 units pRBC at [facility] ED prior to transfer.   -Admit to telemetry -Will continue to transfuse to keep Hb at least 7, has to be cautious since she also has CHF (will give IV lasix in between transfusion and post-transfusion, her Cr is stable) -Will consult GI for possible EGD in AM -repeat anemia panel -Check orthostatic vitals -Check CBC post transfusion and then q8hr -Start protonix 40mg  IV bid  2. CHF: as seen on CXR consistent with volume overload. Last 2D echo was in 11/2008-mild LVH, EF 55-60%. -Repeat 2D echo -Give small dose of Lasix between transfusion: 10mg  IV, and 20mg  post-tranfusion  3.HTN: actually hypotension due to anemia.  Currently not on any BP meds. Will continue to monitor  4. Depression: stable, continue home meds  5. Recurrent PE/DVT: 2009&2012 on Warfarin.  INR is therapeutic at 2.24.  Will hold anticoagulation in setting of possible acute bleeding. Will put on SCDs  6. Hypokalemia: K 3.2, likely due to poor oral intake. She is not on any diuretics. -Will replete with Kdur x2 doses  7. Dysuria: will check urinalysis and urine ctx, treat empirically with ceftriaxone  8.Interstitial Lung Disease/?CAP: coarse breath sounds with mild wheezing which is consistent with ILD but cannot exclude PNA since CXR is very congested.  Will give IV lasix and repeat CXR in  AM.   PFTs  In 04/2011 shows decreased diffusion capacity, spirometry is consistent with restrictive lung disease and FEV1 and FVC are decreased compared with 08/2005.  Will treat with nebs, ceftriaxone & azithromycin.   DVT ppx: SCDs  Dispo: Disposition is deferred at this time, awaiting improvement of current medical problems. Anticipated discharge in approximately 2-3 day(s).   The patient does have a current PCP (KLIMA, LAWRENCE D, MD), therefore will be requiring OPC follow-up after discharge.   The patient does have transportation limitations that hinder transportation to clinic appointments.  SignedKy Barban 12/22/2011, 10:08 PM

## 2011-12-22 NOTE — ED Notes (Signed)
MD at bedside. 

## 2011-12-22 NOTE — ED Notes (Signed)
[id number]<AV> Expected date:<BR> Expected time:<BR> Means of arrival:<BR> Comments:<BR> SOB

## 2011-12-22 NOTE — ED Notes (Signed)
I-Stat results shown to Rubin Payor, MD

## 2011-12-22 NOTE — ED Notes (Signed)
Blood Stool card obtained by Bebe Shaggy, MD

## 2011-12-22 NOTE — ED Notes (Signed)
Natasha from Lab called with critical Hgb 4.8.  [facility] EDP notified

## 2011-12-22 NOTE — ED Notes (Signed)
Blood Stool Card = Negative

## 2011-12-23 ENCOUNTER — Inpatient Hospital Stay (HOSPITAL_COMMUNITY): Payer: PRIVATE HEALTH INSURANCE

## 2011-12-23 ENCOUNTER — Encounter (HOSPITAL_COMMUNITY): Payer: Self-pay

## 2011-12-23 DIAGNOSIS — E876 Hypokalemia: Secondary | ICD-10-CM | POA: Diagnosis present

## 2011-12-23 DIAGNOSIS — D62 Acute posthemorrhagic anemia: Secondary | ICD-10-CM

## 2011-12-23 DIAGNOSIS — I509 Heart failure, unspecified: Secondary | ICD-10-CM

## 2011-12-23 DIAGNOSIS — R0602 Shortness of breath: Secondary | ICD-10-CM | POA: Diagnosis present

## 2011-12-23 DIAGNOSIS — B9620 Unspecified Escherichia coli [E. coli] as the cause of diseases classified elsewhere: Secondary | ICD-10-CM | POA: Diagnosis present

## 2011-12-23 LAB — CBC
HCT: 21.6 % — ABNORMAL LOW (ref 36.0–46.0)
HCT: 22 % — ABNORMAL LOW (ref 36.0–46.0)
Hemoglobin: 6.1 g/dL — CL (ref 12.0–15.0)
MCH: 19.7 pg — ABNORMAL LOW (ref 26.0–34.0)
MCHC: 28.2 g/dL — ABNORMAL LOW (ref 30.0–36.0)
RBC: 3.1 MIL/uL — ABNORMAL LOW (ref 3.87–5.11)
RDW: 20.8 % — ABNORMAL HIGH (ref 11.5–15.5)
WBC: 10.8 10*3/uL — ABNORMAL HIGH (ref 4.0–10.5)

## 2011-12-23 LAB — TYPE AND SCREEN
Antibody Screen: NEGATIVE
Unit division: 0

## 2011-12-23 LAB — HEPATIC FUNCTION PANEL
ALT: 6 U/L (ref 0–35)
Albumin: 2.9 g/dL — ABNORMAL LOW (ref 3.5–5.2)
Alkaline Phosphatase: 99 U/L (ref 39–117)
Indirect Bilirubin: 0.3 mg/dL (ref 0.3–0.9)
Total Bilirubin: 0.4 mg/dL (ref 0.3–1.2)
Total Protein: 6 g/dL (ref 6.0–8.3)

## 2011-12-23 LAB — URINALYSIS, ROUTINE W REFLEX MICROSCOPIC
Bilirubin Urine: NEGATIVE
Hgb urine dipstick: NEGATIVE
Nitrite: POSITIVE — AB
Specific Gravity, Urine: 1.018 (ref 1.005–1.030)
pH: 6 (ref 5.0–8.0)

## 2011-12-23 LAB — CBC WITH DIFFERENTIAL/PLATELET
Basophils Absolute: 0 10*3/uL (ref 0.0–0.1)
Eosinophils Absolute: 0 10*3/uL (ref 0.0–0.7)
HCT: 19.6 % — ABNORMAL LOW (ref 36.0–46.0)
Lymphs Abs: 1.1 10*3/uL (ref 0.7–4.0)
MCH: 18.2 pg — ABNORMAL LOW (ref 26.0–34.0)
MCHC: 27 g/dL — ABNORMAL LOW (ref 30.0–36.0)
MCV: 67.1 fL — ABNORMAL LOW (ref 78.0–100.0)
Monocytes Absolute: 0.6 10*3/uL (ref 0.1–1.0)
Monocytes Relative: 5 % (ref 3–12)
Neutro Abs: 9.5 10*3/uL — ABNORMAL HIGH (ref 1.7–7.7)
Platelets: 288 10*3/uL (ref 150–400)
RDW: 20.5 % — ABNORMAL HIGH (ref 11.5–15.5)

## 2011-12-23 LAB — URINE MICROSCOPIC-ADD ON

## 2011-12-23 LAB — IRON AND TIBC
Saturation Ratios: 4 % — ABNORMAL LOW (ref 20–55)
TIBC: 414 ug/dL (ref 250–470)

## 2011-12-23 LAB — TROPONIN I
Troponin I: <0.3 ng/mL (ref <0.30)
Troponin I: <0.3 ng/mL (ref <0.30)
Troponin I: <0.3 ng/mL (ref <0.30)

## 2011-12-23 LAB — BASIC METABOLIC PANEL
GFR calc non Af Amer: >90 mL/min (ref >90)
Glucose, Bld: 151 mg/dL — ABNORMAL HIGH (ref 70–99)
Potassium: 3.3 mEq/L — ABNORMAL LOW (ref 3.5–5.1)
Sodium: 142 mEq/L (ref 135–145)

## 2011-12-23 LAB — MAGNESIUM: Magnesium: 2.2 mg/dL (ref 1.5–2.5)

## 2011-12-23 LAB — EXPECTORATED SPUTUM ASSESSMENT W GRAM STAIN, RFLX TO RESP C

## 2011-12-23 LAB — FERRITIN: Ferritin: 5 ng/mL — ABNORMAL LOW (ref 10–291)

## 2011-12-23 LAB — RETICULOCYTES: Retic Ct Pct: 1.3 % (ref 0.4–3.1)

## 2011-12-23 LAB — PREPARE RBC (CROSSMATCH)

## 2011-12-23 LAB — HEMOGLOBIN A1C: Hgb A1c MFr Bld: 6.1 % — ABNORMAL HIGH (ref <5.7)

## 2011-12-23 MED ORDER — FERROUS SULFATE 325 (65 FE) MG PO TABS
325.0000 mg | ORAL_TABLET | Freq: Three times a day (TID) | ORAL | Status: DC
  Administered 2011-12-23 – 2011-12-29 (×20): 325 mg via ORAL
  Filled 2011-12-23 (×24): qty 1

## 2011-12-23 MED ORDER — GABAPENTIN 100 MG PO CAPS
100.0000 mg | ORAL_CAPSULE | Freq: Three times a day (TID) | ORAL | Status: DC
  Administered 2011-12-23 – 2011-12-29 (×20): 100 mg via ORAL
  Filled 2011-12-23 (×23): qty 1

## 2011-12-23 MED ORDER — LORAZEPAM 0.5 MG PO TABS
1.0000 mg | ORAL_TABLET | Freq: Every evening | ORAL | Status: DC | PRN
  Administered 2011-12-23 – 2011-12-28 (×7): 1 mg via ORAL
  Filled 2011-12-23 (×2): qty 1
  Filled 2011-12-23 (×2): qty 2
  Filled 2011-12-23: qty 1
  Filled 2011-12-23: qty 2
  Filled 2011-12-23 (×3): qty 1

## 2011-12-23 MED ORDER — POTASSIUM CHLORIDE CRYS ER 20 MEQ PO TBCR
40.0000 meq | EXTENDED_RELEASE_TABLET | Freq: Once | ORAL | Status: AC
  Administered 2011-12-23: 40 meq via ORAL
  Filled 2011-12-23: qty 1

## 2011-12-23 MED ORDER — DEXTROSE 5 % IV SOLN
2.0000 g | Freq: Every day | INTRAVENOUS | Status: DC
  Administered 2011-12-23 – 2011-12-25 (×4): 2 g via INTRAVENOUS
  Filled 2011-12-23 (×7): qty 2

## 2011-12-23 MED ORDER — FUROSEMIDE 10 MG/ML IJ SOLN
10.0000 mg | Freq: Once | INTRAMUSCULAR | Status: AC
  Administered 2011-12-23: 10 mg via INTRAVENOUS

## 2011-12-23 MED ORDER — INFLUENZA VIRUS VACC SPLIT PF IM SUSP
0.5000 mL | INTRAMUSCULAR | Status: AC
  Administered 2011-12-24: 0.5 mL via INTRAMUSCULAR
  Filled 2011-12-23: qty 0.5

## 2011-12-23 MED ORDER — LORAZEPAM 0.5 MG PO TABS: 2.0000 mg | ORAL_TABLET | Freq: Every day | ORAL | Status: DC

## 2011-12-23 MED ORDER — LORAZEPAM 0.5 MG PO TABS: 1.0000 mg | ORAL_TABLET | Freq: Every day | ORAL | Status: DC

## 2011-12-23 MED ORDER — SODIUM CHLORIDE 0.9 % IJ SOLN
3.0000 mL | Freq: Two times a day (BID) | INTRAMUSCULAR | Status: DC
  Administered 2011-12-23: 23:00:00 via INTRAVENOUS
  Administered 2011-12-23 – 2011-12-29 (×9): 3 mL via INTRAVENOUS

## 2011-12-23 MED ORDER — LORAZEPAM 0.5 MG PO TABS: 1.0000 mg | ORAL_TABLET | Freq: Two times a day (BID) | ORAL | Status: DC

## 2011-12-23 MED ORDER — ALBUTEROL SULFATE HFA 108 (90 BASE) MCG/ACT IN AERS: 2.0000 | INHALATION_SPRAY | Freq: Four times a day (QID) | RESPIRATORY_TRACT | Status: DC

## 2011-12-23 MED ORDER — ACETAMINOPHEN 650 MG RE SUPP: 650.0000 mg | Freq: Four times a day (QID) | RECTAL | Status: DC | PRN

## 2011-12-23 MED ORDER — SERTRALINE HCL 100 MG PO TABS
200.0000 mg | ORAL_TABLET | Freq: Every day | ORAL | Status: DC
  Administered 2011-12-23 – 2011-12-29 (×7): 200 mg via ORAL
  Filled 2011-12-23 (×7): qty 2

## 2011-12-23 MED ORDER — LAMOTRIGINE 200 MG PO TABS
200.0000 mg | ORAL_TABLET | Freq: Every morning | ORAL | Status: DC
  Administered 2011-12-23 – 2011-12-29 (×7): 200 mg via ORAL
  Filled 2011-12-23 (×7): qty 1

## 2011-12-23 MED ORDER — SIMVASTATIN 20 MG PO TABS
20.0000 mg | ORAL_TABLET | Freq: Every day | ORAL | Status: DC
  Administered 2011-12-23 – 2011-12-28 (×6): 20 mg via ORAL
  Filled 2011-12-23 (×8): qty 1

## 2011-12-23 MED ORDER — FUROSEMIDE 10 MG/ML IJ SOLN: 10.0000 mg | Freq: Once | INTRAMUSCULAR | Status: DC

## 2011-12-23 MED ORDER — DEXTROSE 5 % IV SOLN
500.0000 mg | INTRAVENOUS | Status: DC
  Administered 2011-12-23: 500 mg via INTRAVENOUS
  Filled 2011-12-23: qty 500

## 2011-12-23 MED ORDER — ONDANSETRON HCL 4 MG PO TABS: 4.0000 mg | ORAL_TABLET | Freq: Four times a day (QID) | ORAL | Status: DC | PRN

## 2011-12-23 MED ORDER — ACETAMINOPHEN 325 MG PO TABS: 650.0000 mg | ORAL_TABLET | Freq: Four times a day (QID) | ORAL | Status: DC | PRN

## 2011-12-23 MED ORDER — SODIUM CHLORIDE 0.9 % IJ SOLN
3.0000 mL | Freq: Two times a day (BID) | INTRAMUSCULAR | Status: DC
  Administered 2011-12-23 – 2011-12-29 (×8): 3 mL via INTRAVENOUS

## 2011-12-23 MED ORDER — FUROSEMIDE 10 MG/ML IJ SOLN
40.0000 mg | Freq: Two times a day (BID) | INTRAMUSCULAR | Status: DC
  Administered 2011-12-23 – 2011-12-29 (×12): 40 mg via INTRAVENOUS
  Filled 2011-12-23 (×17): qty 4

## 2011-12-23 MED ORDER — QUETIAPINE FUMARATE 400 MG PO TABS
400.0000 mg | ORAL_TABLET | Freq: Every day | ORAL | Status: DC
  Administered 2011-12-23 – 2011-12-24 (×3): 400 mg via ORAL
  Administered 2011-12-25 (×2): 200 mg via ORAL
  Administered 2011-12-26 – 2011-12-28 (×3): 400 mg via ORAL
  Filled 2011-12-23 (×10): qty 1

## 2011-12-23 MED ORDER — ALBUTEROL SULFATE (5 MG/ML) 0.5% IN NEBU
2.5000 mg | INHALATION_SOLUTION | Freq: Four times a day (QID) | RESPIRATORY_TRACT | Status: DC
  Administered 2011-12-23 (×4): 2.5 mg via RESPIRATORY_TRACT
  Filled 2011-12-23 (×4): qty 0.5

## 2011-12-23 MED ORDER — SODIUM CHLORIDE 0.9 % IJ SOLN: 3.0000 mL | INTRAMUSCULAR | Status: DC | PRN

## 2011-12-23 MED ORDER — CHLORHEXIDINE GLUCONATE CLOTH 2 % EX PADS
6.0000 | MEDICATED_PAD | Freq: Every day | CUTANEOUS | Status: AC
  Administered 2011-12-23 – 2011-12-26 (×4): 6 via TOPICAL

## 2011-12-23 MED ORDER — FUROSEMIDE 10 MG/ML IJ SOLN: 20.0000 mg | Freq: Once | INTRAMUSCULAR | Status: DC

## 2011-12-23 MED ORDER — MUPIROCIN 2 % EX OINT
1.0000 "application " | TOPICAL_OINTMENT | Freq: Two times a day (BID) | CUTANEOUS | Status: AC
  Administered 2011-12-23 – 2011-12-27 (×9): 1 via NASAL
  Filled 2011-12-23 (×3): qty 22

## 2011-12-23 MED ORDER — FERROUS SULFATE 325 (65 FE) MG PO TBEC: 325.0000 mg | DELAYED_RELEASE_TABLET | Freq: Three times a day (TID) | ORAL | Status: DC

## 2011-12-23 MED ORDER — PANTOPRAZOLE SODIUM 40 MG IV SOLR
40.0000 mg | Freq: Two times a day (BID) | INTRAVENOUS | Status: DC
  Administered 2011-12-23 – 2011-12-26 (×9): 40 mg via INTRAVENOUS
  Filled 2011-12-23 (×13): qty 40

## 2011-12-23 MED ORDER — IPRATROPIUM BROMIDE 0.02 % IN SOLN
0.5000 mg | Freq: Four times a day (QID) | RESPIRATORY_TRACT | Status: DC
  Administered 2011-12-23 (×4): 0.5 mg via RESPIRATORY_TRACT
  Filled 2011-12-23 (×4): qty 2.5

## 2011-12-23 MED ORDER — PNEUMOCOCCAL VAC POLYVALENT 25 MCG/0.5ML IJ INJ
0.5000 mL | INJECTION | INTRAMUSCULAR | Status: AC
  Administered 2011-12-24: 0.5 mL via INTRAMUSCULAR
  Filled 2011-12-23: qty 0.5

## 2011-12-23 MED ORDER — SODIUM CHLORIDE 0.9 % IV SOLN: 250.0000 mL | INTRAVENOUS | Status: DC | PRN

## 2011-12-23 MED ORDER — ONDANSETRON HCL 4 MG/2ML IJ SOLN: 4.0000 mg | Freq: Four times a day (QID) | INTRAMUSCULAR | Status: DC | PRN

## 2011-12-23 MED ORDER — FUROSEMIDE 10 MG/ML IJ SOLN
INTRAMUSCULAR | Status: AC
  Filled 2011-12-23: qty 4

## 2011-12-23 MED ORDER — FUROSEMIDE 10 MG/ML IJ SOLN
20.0000 mg | Freq: Once | INTRAMUSCULAR | Status: AC
  Administered 2011-12-23: 20 mg via INTRAVENOUS

## 2011-12-23 NOTE — Progress Notes (Addendum)
1150 Telephone report given to [facility] [id number]

## 2011-12-23 NOTE — H&P (Signed)
Internal Medicine Teaching Service Attending Note Date: 12/23/2011  Patient name: KIJANA ESTOCK  Medical record number: [id number]  Date of birth: Feb 21, 1945    This patient has been seen and discussed with the house staff. Please see their note for complete details. I concur with their findings with the following additions/corrections: Patient is a 66 year old female with past medical history most significant for iron deficiency anemia most likely secondary to chronic GI blood loss, history of gastric antral vascular ectasia and previous admission for anemia secondary to chronic GI bleed in 2010 was admitted with progressively worsening shortness of breath and weakness. Patient was found to have a hemoglobin less than 5 and was admitted for further workup. Patient denies seeing any blood in her bowel movements. She does have orthopnea and has to use 2 pillows to prop herself up to sleep. Patient denies edema, chest pain, palpitations, fevers, chills, change in appetite, change in bowel movements (she does mention that she has bowel movements every 2 days), change in sleeping pattern or any weakness in particular part of the body.  Past medical history, past surgical history, family history and social history was reviewed and as documented in resident's note.  BP 102/31  Pulse 73  Temp 97.6 F (36.4 C) (Oral)  Resp 15  Ht 5\' 7"  (1.702 m)  Wt 311 lb 1.1 oz (141.1 kg)  BMI 48.72 kg/m2  SpO2 97% Patient was alert and oriented and pleasant throughout examination JVD was not distended although it was difficult to assess as patient is obese S1 and S2 normal, regular rate and rhythm, no murmurs rubs or gallops noted Patient has decreased breath sounds at both bases, she had wheezing in the upper lung fields anteriorly, no crackles or rales Extremities did not have signs of edema and pulses were intact bilaterally Neurological exam was unremarkable  Patient's labs, x-rays and imaging studies  from the past was reviewed with the residents.  Patient's EKG showed sinus rhythm, normal axis, normal PR interval, nonspecific T wave changes in the precordial leads and poor R wave progression which was not different from her previous EKG done on 05/01/2011.  Assessment and plan:  Patient is a 66 year old female with past medical history of GI bleed who comes in for acute on chronic shortness of breath which is multifactorial at this time and most likely related to severe iron deficiency anemia and congestive heart failure likely secondary to high output.  Anemia secondary to chronic GI bleed: Patient currently is suffering from shortness of breath and weakness which is most likely secondary to severe anemia. Patient has had history of gastric antral vascular ectasia which was Treated by Dr. Elnoria Howard. The anemia most likely is coming from a slow GI bleed secondary to above. We will involve Dr. Elnoria Howard. We will hold her Coumadin at this time.  Acute Congestive heart failure: Given that patient has had severe anemia I believe that her heart failure is most likely secondary to high output due to stress from severe anemia. We will start transfusions to get her hemoglobin back up to at least more than 7(in the absence of active GI bleed and chest pain). Will also start Lasix at 40 mg IV twice a day for diuresis. We'll monitor daily basic metabolic profile.  Rest of the medical management as per resident's note.      Lars Mage 12/23/2011, 3:41 PM

## 2011-12-23 NOTE — Progress Notes (Addendum)
Subjective: Patient is intermittently falling asleep when talking with her.  She states she is tired and sleepy.  She uses oxygen at home 2 L prn.  She reports black bowel movements prior to admission at least one and intermittently.  She reports incontinence for a long time.  She reports dysuria.  She denies chest pain, shortness of breath.  She report chills at home and subjective fever.  She also reports being weak x 2 weeks though she has been eating.  She also reports a cough with white sputum.  She is still actively  Smoking cigarettes.  She lives alone and has [facility] that cooks meals for her 3 times a week.  She does not have a PCP and has problems with transportation.  She is not married and has 1 son.    Objective: Vital signs in last 24 hours: Filed Vitals:   12/23/11 0925 12/23/11 1000 12/23/11 1330 12/23/11 1424  BP:  125/53 102/31   Pulse:  74 73   Temp:   97.6 F (36.4 C)   TempSrc:   Oral   Resp:   15   Height:      Weight:      SpO2: 99% 98% 97% 97%   Weight change:   Intake/Output Summary (Last 24 hours) at 12/23/11 1500 Last data filed at 12/23/11 1342  Gross per 24 hour  Intake     60 ml  Output   2125 ml  Net  -2065 ml   General:lying in bed asleep but arousable and will respond to questions  HEENT:[place]/at CV: RRR no rubs, murmurs or gallops Lungs: wheezes bilaterally  Abdomen: obese, soft, ntnd, normal bowel sounds Extremities: warm, no cyanosis or edema Neuro: CN 2-12 grossly intact able to move all 4 extremities.   Lab Results: Basic Metabolic Panel:  Lab 12/23/11 3086 12/23/11 0200 12/22/11 1948 12/22/11 1700  NA -- 142 140 --  K -- 3.3* 3.2* --  CL -- 104 103 --  CO2 -- 29 -- 28  GLUCOSE -- 151* 160* --  BUN -- 14 15 --  CREATININE -- 0.60 0.80 --  CALCIUM -- 8.5 -- 8.6  MG 2.2 -- -- --  PHOS -- -- -- --   Liver Function Tests:  Lab 12/23/11 0200  AST 16  ALT 6  ALKPHOS 99  BILITOT 0.4  PROT 6.0  ALBUMIN 2.9*   CBC:  Lab  12/23/11 0857 12/23/11 0200  WBC 10.1 11.2*  NEUTROABS -- 9.5*  HGB 6.1* 5.3*  HCT 21.6* 19.6*  MCV 69.7* 67.1*  PLT 282 288   Cardiac Enzymes:  Lab 12/23/11 0812  CKTOTAL --  CKMB --  CKMBINDEX --  TROPONINI <0.30   BNP:  Lab 12/23/11 0200  PROBNP 1346.0*   Hemoglobin A1C:  Lab 12/23/11 0200  HGBA1C 6.1*   Thyroid Function Tests:  Lab 12/23/11 0200  TSH 0.618  T4TOTAL --  FREET4 --  T3FREE --  THYROIDAB --   Coagulation:  Lab 12/22/11 1700  LABPROT 23.8*  INR 2.24*   Anemia Panel:  Lab 12/23/11 0200  VITAMINB12 455  FOLATE 9.0  FERRITIN 5*  TIBC 414  IRON 18*  RETICCTPCT 1.3   Urinalysis:  Lab 12/23/11 0348  COLORURINE YELLOW  LABSPEC 1.018  PHURINE 6.0  GLUCOSEU NEGATIVE  HGBUR NEGATIVE  BILIRUBINUR NEGATIVE  KETONESUR NEGATIVE  PROTEINUR NEGATIVE  UROBILINOGEN 1.0  NITRITE POSITIVE*  LEUKOCYTESUR MODERATE*   Misc. Labs: Urine culture, respiratory culture  Micro Results:  Recent Results (from the past 240 hour(s))  MRSA PCR SCREENING     Status: Abnormal   Collection Time   12/23/11 12:22 AM      Component Value Range Status Comment   MRSA by PCR POSITIVE (*) NEGATIVE Final   CULTURE, EXPECTORATED SPUTUM-ASSESSMENT     Status: Normal   Collection Time   12/23/11  3:31 AM      Component Value Range Status Comment   Specimen Description SPUTUM   Final    Special Requests NONE   Final    Sputum evaluation     Final    Value: THIS SPECIMEN IS ACCEPTABLE. RESPIRATORY CULTURE REPORT TO FOLLOW.   Report Status 12/23/2011 FINAL   Final   CULTURE, RESPIRATORY     Status: Normal (Preliminary result)   Collection Time   12/23/11  3:31 AM      Component Value Range Status Comment   Specimen Description SPUTUM   Final    Special Requests NONE   Final    Gram Stain     Final    Value: MODERATE WBC PRESENT,BOTH PMN AND MONONUCLEAR     MODERATE SQUAMOUS EPITHELIAL CELLS PRESENT     NO ORGANISMS SEEN   Culture PENDING   Incomplete    Report  Status PENDING   Incomplete    Studies/Results: Dg Chest 2 View  12/23/2011  *RADIOLOGY REPORT*  Clinical Data: Pneumonia; chest pain and shortness of breath  CHEST - 2 VIEW  Comparison: December 22, 2011  Findings:  There is cardiomegaly with widespread interstitial and patchy alveolar edema, stable.  There is a degree of pulmonary venous hypertension.  There is no new opacity.  There is mild thoracic dextroscoliosis.  There is postoperative change in the lower cervical spine.  IMPRESSION: Stable chest.  Findings felt to represent congestive heart failure. Cannot exclude superimposed infectious pneumonia in the right upper lobe.   Original Report Authenticated By: Bretta Bang, M.D.    Dg Chest 2 View (if Patient Has Fever And/or Copd)  12/22/2011  *RADIOLOGY REPORT*  Clinical Data: Chest pain, shortness of breath, cough  CHEST - 2 VIEW  Comparison: 05/01/2011  Findings: Worsening diffuse airspace disease versus edema throughout both lungs.  Heart is enlarged.  Central vascularity prominent.  CHF is favored.  No large effusion or pneumothorax.  IMPRESSION: Worsening diffuse airspace process versus edema.  CHF is favored.   Original Report Authenticated By: Judie Petit. Miles Costain, M.D.    Medications:  Scheduled Meds:   . albuterol  2.5 mg Nebulization Q6H  . cefTRIAXone (ROCEPHIN)  IV  2 g Intravenous QHS  . Chlorhexidine Gluconate Cloth  6 each Topical Q0600  . ferrous sulfate  325 mg Oral TID WC  . [COMPLETED] furosemide      . [COMPLETED] furosemide  10 mg Intravenous Once  . [COMPLETED] furosemide  20 mg Intravenous Once  . furosemide  20 mg Intravenous Once  . gabapentin  100 mg Oral TID  . influenza  inactive virus vaccine  0.5 mL Intramuscular Tomorrow-1000  . ipratropium  0.5 mg Nebulization Q6H  . lamoTRIgine  200 mg Oral q morning - 10a  . mupirocin ointment  1 application Nasal BID  . pantoprazole (PROTONIX) IV  40 mg Intravenous Q12H  . pneumococcal 23 valent vaccine  0.5 mL Intramuscular  Tomorrow-1000  . [COMPLETED] potassium chloride SA  40 mEq Oral Once  . QUEtiapine  400 mg Oral QHS  . sertraline  200 mg Oral Daily  .  simvastatin  20 mg Oral q1800  . sodium chloride  3 mL Intravenous Q12H  . sodium chloride  3 mL Intravenous Q12H  . [DISCONTINUED] albuterol  2 puff Inhalation QID  . [DISCONTINUED] azithromycin (ZITHROMAX) 500 MG IVPB  500 mg Intravenous Q24H  . [DISCONTINUED] ferrous sulfate  325 mg Oral TID WC  . [DISCONTINUED] furosemide  10 mg Intramuscular Once  . [DISCONTINUED] furosemide  20 mg Intravenous Once  . [DISCONTINUED] LORazepam  1 mg Oral Daily  . [DISCONTINUED] LORazepam  1-2 mg Oral BID  . 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  She presents for weakness, shortness of breath and cough noted to have a hemoglobin of 4.8 on admission and a history of black bowel movements prior to admission.    1. Anemia with acute blood loss and iron deficiency -Hemoglobin 4.8 on admission.  She was transfused 1 pRBCs [facility] and transferred to [facility].  She has received 1 more unit here (total 2 units) and will transfuse with the 3rd unit due to hemoglobin 6.2, then repeat CBC post transfusion today.  Give Lasix 40 mg iv prior to transfusion of 3rd unit  -Etiology of blood loss could be secondary to gastric antral vascular ectasia (GAVE) noted by Dr. Elnoria Howard EGD 06/2008.  Repeat anemia panel indicates iron deficiency anemia as well.  GAVE cane cause iron deficiency anemia, slow chronic GI blood loss -She is on anticoagulation for history of thrombotic events  which is currently being held. INR 2.24 12/2 -FOBT negative 12/22/11 -GI (Dr. Loreta Ave) consulted and aware; Dr. Elnoria Howard will possibly do a EGD procedure Thursday -patient is NPO but will resume diet  -pending CBC q  8 -goal transfuse if hemoglobin <7 but be careful due to acute CHF exacerbation noted on CXR. Give Lasix in between transfusions. 2 units on hold.  She has had 2 units and 1 is going to be transfused soon for a total of 3 units as of 4 PM today -O2 per 2L [place]-home dose  -Protonix 40 mg iv q 12 hours   2. Acute CHF secondary to high output heart failure due to anemia -not tachypneic or respiratory distress, no lower extremitiy edema but elevated BNP 1346. CXR suggestive of CHF or right upper lobe superimposed pneumonia  -EKG with mild ST depression in II, III, avF but no elevations in trop x 2. Repeat EKG improved  -echo 2010 w/ EF 55-60% no diastolic or systolic dysfunction noted.  -fluid restricted diet  -Lasix 40 mg iv bid  -strict i/o, daily weights  -consider repeat echo, trending enzymes    3. History of HYPERTENSION but slightly hypotensive this admission -low 90s/40 but pressure as of now 102/31 -will monitor -no antihypertensives   4. 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   5. Possible UTI -with symptoms of dysuria -pending urine culture, UA +nitrites and leukocyte esterase -Continue Rocephin  6. MRSA + nares -protocol  7. DVT Px  -scds   8. F/E/N -will monitor electrolytes; hypokalemic this admission (3.0 12/2, 3.3 today); Mag 2.2 (nl); replaced with oral K 40 meQ today, CMET in the  am -fluid restricted heart diet 1 L   Dispo: Disposition is deferred at this time, awaiting improvement of current medical problems.  Anticipated discharge in approximately 2-3 day(s).   The patient does have a current PCP (KLIMA, LAWRENCE D, MD), therefore is requiring OPC follow-up after discharge.   The patient  does have transportation limitations that hinder transportation to clinic appointments.  .Services Needed at time of discharge: Y = Yes, Blank = No PT:   OT:   RN:   Equipment:   Other:     LOS: 1 day   Annett Gula [phone] 12/23/2011, 3:00 PM

## 2011-12-23 NOTE — Progress Notes (Signed)
Pt arrived to the unit via [facility]. Placed on tele. VSS. MD made aware of pt's arrival to the unit. Will come to see at bedside. Benay Pike

## 2011-12-23 NOTE — Progress Notes (Signed)
1130 seen by Dr . Anselm Lis  To transfuse the 2nd blood when transferred to Step down unit . Aware of repeat CBC after ttransfusion

## 2011-12-23 NOTE — Progress Notes (Signed)
CRITICAL VALUE ALERT  Critical value received:  MRSA Swab +  Date of notification:  12/23/11  Time of notification:  0148  Critical value read back:yes  Nurse who received alert:  Anarely Nicholls,RN  MD notified (1st page):Kennerly, MD    Time of first page:  0158  MD notified (2nd page):  Time of second page:  Responding MD:  Garald Braver, MD  Time MD responded:  [phone]

## 2011-12-23 NOTE — Care Management Note (Unsigned)
    Page 1 of 1   12/23/2011     11:41:46 AM   CARE MANAGEMENT NOTE 12/23/2011  Patient:  [name], [name]   Account Number:  [id number]  Date Initiated:  12/23/2011  Documentation initiated by:  GRAVES-BIGELOW,Katlen Seyer  Subjective/Objective Assessment:   Pt admitted with anemia. S/p transfusion PRBC. IV lasix due to HX CHF.     Action/Plan:   CM will continue to monitor for disposition needs.   Anticipated DC Date:  12/25/2011   Anticipated DC Plan:  HOME W HOME HEALTH SERVICES      DC Planning Services  CM consult      Choice offered to / List presented to:             Status of service:  In process, will continue to follow Medicare Important Message given?   (If response is "NO", the following Medicare IM given date fields will be blank) Date Medicare IM given:   Date Additional Medicare IM given:    Discharge Disposition:    Per UR Regulation:  Reviewed for med. necessity/level of care/duration of stay  If discussed at Long Length of Stay Meetings, dates discussed:    Comments:

## 2011-12-23 NOTE — Progress Notes (Signed)
CRITICAL VALUE ALERT  Critical value received:  Hgb =5.3  Date of notification:  12/23/11  Time of notification:  0318  Critical value read back:yes  Nurse who received alert:  Tera Mater, RN from Lab; MD called by Enrique Sack, RN  MD notified (1st page):  Garald Braver  Time of first page:  0330  MD notified (2nd page):  Time of second page:  Responding MD:  Garald Braver  Time MD responded:  [phone]

## 2011-12-23 NOTE — Progress Notes (Signed)
1300 transferred to 2900 via bed with RN, Nt the patient with O2 3l/m in progress.

## 2011-12-23 NOTE — Progress Notes (Addendum)
Patient is completing second unit PRBC currently (first unit at [facility]).  In setting of likely high output congestive heart failure and chronic GI bleed, will do the following: After completion of second unit, please give lasix as instructed after transfusion and check CBC.  Hold 3rd unit until this afternoon (Will be determined after first unit post transfusion CBC).  Will hold 4th unit until further notice.  Will also transfer to SDU for closer monitoring of pressures and O2 saturations.

## 2011-12-23 NOTE — Progress Notes (Signed)
Pt refused orthostatic VS this AM. MD aware.  Benay Pike

## 2011-12-23 NOTE — Consult Note (Signed)
Reason for Consult: Severe anemia Referring Physician: Teaching Service  Nicholos Johns HPI: This is a 66 year old female with multiple medical problems who is well-known to me.  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  Over 4 EGD sessions the GAVE was ablated with APC and she has remained stable until now.  She complained of having issues with DOE and SOB and with further work up her HGB was noted to be in the 4 range with microcytosis, however, she is heme negative.  As a result of the findings and her past medical history of GAVE a GI consultation was requested.    Past Medical History  Diagnosis Date  . COPD (chronic obstructive pulmonary disease)   . Depression     followed by Dr. Gwyndolyn Kaufman, [facility]  . GERD (gastroesophageal reflux disease)   . Hypertension   . Hyperlipidemia   . Interstitial cystitis     followed by Dr. Isabel Caprice  . Irritable bowel syndrome     colonoscopy 12/2003 and EGD 1997 WNL, followed by Dr. Marina Goodell  . Spinal stenosis     L3-L4, L4-L5 per MRI 12/2004, status post lumbar decompression 02/2007 by Dr. Gerlene Fee  . Pulmonary embolism 10/2007    on coumadin and followed by Dr. Alexandria Lodge  . OSA on CPAP     noncompliance  . S/P mastectomy 1992    bilateral. status post reconstruction in 1994 by Dr. Benna Dunks  . S/P rhinoplasty 1993  . PONV (postoperative nausea and vomiting)   . Pneumonia   . Exertional dyspnea   . Basal cell carcinoma 09/2001    followed by Dr. Karlyn Agee [facility]  . Breast cancer     bilaterally  . Headache     "use to have them alot" (07/24/11)  . Arthritis     "everywhere"  . Chronic lower back pain   . Anxiety   . Anginal pain   . Stroke   . Anemia   . Fibromyalgia     Past Surgical History  Procedure Date  . Cervical discectomy 11/1995 and 05/2002    by Dr. Gerlene Fee  . Abdominal hysterectomy 1976  . Bladder suspension 1993  . Orif ankle fracture 1993  . Lumbar  laminectomy/decompression microdiscectomy 02/2007    by Dr. Gerlene Fee  . Carpel   . Carpal tunnel release     LEFT  . Carpectomy 07/24/11    left wrist  . Fracture surgery   . Tonsillectomy and adenoidectomy 1953  . Appendectomy     "had it done; later told that they weren't gone"  . Dilation and curettage of uterus     "several of them"  . Tubal ligation   . Mastectomy 1992    "both of them are gone"  . Skin cancer excision     "arms"  . Carpectomy 07/23/2011    Procedure: CARPECTOMY;  Surgeon: Sharma Covert, MD;  Location: [facility] OR;  Service: Orthopedics;  Laterality: Left;  LEFT WRIST PROXIMAL ROW CARPECTOMY    Family History  Problem Relation Age of Onset  . Heart disease Mother   . Cancer Brother     Social History:  reports that she quit smoking about 2 weeks ago. Her smoking use included Cigarettes. She has a 7.5 pack-year smoking history. She has never used smokeless tobacco. She reports that she does not drink alcohol or use illicit drugs.  Allergies:  Allergies  Allergen  Reactions  . Adhesive (Tape) Other (See Comments)    "I break out all over"; Can only use paper tape  . Chlorpromazine Hcl Nausea And Vomiting  . Codeine Other (See Comments)    "first dose I took, my aide found me the next day laying on the floor"  . Morphine Other (See Comments)    caused change in mental status, she does not want to try it again.  . Sulfonamide Derivatives Nausea And Vomiting    Medications:  Scheduled:   . albuterol  2.5 mg Nebulization Q6H  . cefTRIAXone (ROCEPHIN)  IV  2 g Intravenous QHS  . Chlorhexidine Gluconate Cloth  6 each Topical Q0600  . ferrous sulfate  325 mg Oral TID WC  . [COMPLETED] furosemide      . [COMPLETED] furosemide  10 mg Intravenous Once  . [COMPLETED] furosemide  20 mg Intravenous Once  . furosemide  20 mg Intravenous Once  . gabapentin  100 mg Oral TID  . influenza  inactive virus vaccine  0.5 mL Intramuscular Tomorrow-1000  . ipratropium  0.5 mg  Nebulization Q6H  . lamoTRIgine  200 mg Oral q morning - 10a  . mupirocin ointment  1 application Nasal BID  . pantoprazole (PROTONIX) IV  40 mg Intravenous Q12H  . pneumococcal 23 valent vaccine  0.5 mL Intramuscular Tomorrow-1000  . [COMPLETED] potassium chloride SA  40 mEq Oral Once  . QUEtiapine  400 mg Oral QHS  . sertraline  200 mg Oral Daily  . simvastatin  20 mg Oral q1800  . sodium chloride  3 mL Intravenous Q12H  . sodium chloride  3 mL Intravenous Q12H  . [DISCONTINUED] albuterol  2 puff Inhalation QID  . [DISCONTINUED] azithromycin (ZITHROMAX) 500 MG IVPB  500 mg Intravenous Q24H  . [DISCONTINUED] ferrous sulfate  325 mg Oral TID WC  . [DISCONTINUED] furosemide  10 mg Intramuscular Once  . [DISCONTINUED] furosemide  20 mg Intravenous Once  . [DISCONTINUED] LORazepam  1 mg Oral Daily  . [DISCONTINUED] LORazepam  1-2 mg Oral BID  . [DISCONTINUED] LORazepam  2 mg Oral QHS   Continuous:   Results for orders placed during the hospital encounter of 12/22/11 (from the past 24 hour(s))  CBC     Status: Abnormal   Collection Time   12/22/11  5:00 PM      Component Value Range   WBC 12.6 (*) 4.0 - 10.5 K/uL   RBC 2.77 (*) 3.87 - 5.11 MIL/uL   Hemoglobin 4.8 (*) 12.0 - 15.0 g/dL   HCT 40.9 (*) 81.1 - 91.4 %   MCV 65.7 (*) 78.0 - 100.0 fL   MCH 17.3 (*) 26.0 - 34.0 pg   MCHC 26.4 (*) 30.0 - 36.0 g/dL   RDW 78.2 (*) 95.6 - 21.3 %   Platelets 344  150 - 400 K/uL  BASIC METABOLIC PANEL     Status: Abnormal   Collection Time   12/22/11  5:00 PM      Component Value Range   Sodium 139  135 - 145 mEq/L   Potassium 3.0 (*) 3.5 - 5.1 mEq/L   Chloride 101  96 - 112 mEq/L   CO2 28  19 - 32 mEq/L   Glucose, Bld 151 (*) 70 - 99 mg/dL   BUN 14  6 - 23 mg/dL   Creatinine, Ser 0.86  0.50 - 1.10 mg/dL   Calcium 8.6  8.4 - 57.8 mg/dL   GFR calc non Af Amer 85 (*) >  90 mL/min   GFR calc Af Amer >90  >90 mL/min  PROTIME-INR     Status: Abnormal   Collection Time   12/22/11  5:00 PM       Component Value Range   Prothrombin Time 23.8 (*) 11.6 - 15.2 seconds   INR 2.24 (*) 0.00 - 1.49  POCT I-STAT TROPONIN I     Status: Normal   Collection Time   12/22/11  5:05 PM      Component Value Range   Troponin i, poc 0.00  0.00 - 0.08 ng/mL   Comment 3           TYPE AND SCREEN     Status: Normal   Collection Time   12/22/11  6:25 PM      Component Value Range   ABO/RH(D) A POS     Antibody Screen NEG     Sample Expiration 12/25/2011     Unit Number [id number]     Blood Component Type RED CELLS,LR     Unit division 00     Status of Unit ISSUED,FINAL     Transfusion Status OK TO TRANSFUSE     Crossmatch Result Compatible    ABO/RH     Status: Normal   Collection Time   12/22/11  6:25 PM      Component Value Range   ABO/RH(D) A POS    POCT I-STAT, CHEM 8     Status: Abnormal   Collection Time   12/22/11  7:48 PM      Component Value Range   Sodium 140  135 - 145 mEq/L   Potassium 3.2 (*) 3.5 - 5.1 mEq/L   Chloride 103  96 - 112 mEq/L   BUN 15  6 - 23 mg/dL   Creatinine, Ser 2.95  0.50 - 1.10 mg/dL   Glucose, Bld 621 (*) 70 - 99 mg/dL   Calcium, Ion 3.08 (*) 1.13 - 1.30 mmol/L   TCO2 27  0 - 100 mmol/L   Hemoglobin 6.1 (*) 12.0 - 15.0 g/dL   HCT 65.7 (*) 84.6 - 96.2 %   Comment NOTIFIED PHYSICIAN    OCCULT BLOOD, POC DEVICE     Status: Normal   Collection Time   12/22/11  8:32 PM      Component Value Range   Fecal Occult Bld NEGATIVE    PREPARE RBC (CROSSMATCH)     Status: Normal   Collection Time   12/22/11  9:30 PM      Component Value Range   Order Confirmation ORDER PROCESSED BY BLOOD BANK    MRSA PCR SCREENING     Status: Abnormal   Collection Time   12/23/11 12:22 AM      Component Value Range   MRSA by PCR POSITIVE (*) NEGATIVE  CBC WITH DIFFERENTIAL     Status: Abnormal   Collection Time   12/23/11  2:00 AM      Component Value Range   WBC 11.2 (*) 4.0 - 10.5 K/uL   RBC 2.92 (*) 3.87 - 5.11 MIL/uL   Hemoglobin 5.3 (*) 12.0 - 15.0 g/dL   HCT 95.2  (*) 84.1 - 46.0 %   MCV 67.1 (*) 78.0 - 100.0 fL   MCH 18.2 (*) 26.0 - 34.0 pg   MCHC 27.0 (*) 30.0 - 36.0 g/dL   RDW 32.4 (*) 40.1 - 02.7 %   Platelets 288  150 - 400 K/uL   Neutrophils Relative 85 (*) 43 - 77 %  Lymphocytes Relative 10 (*) 12 - 46 %   Monocytes Relative 5  3 - 12 %   Eosinophils Relative 0  0 - 5 %   Basophils Relative 0  0 - 1 %   Neutro Abs 9.5 (*) 1.7 - 7.7 K/uL   Lymphs Abs 1.1  0.7 - 4.0 K/uL   Monocytes Absolute 0.6  0.1 - 1.0 K/uL   Eosinophils Absolute 0.0  0.0 - 0.7 K/uL   Basophils Absolute 0.0  0.0 - 0.1 K/uL   RBC Morphology POLYCHROMASIA PRESENT    TSH     Status: Normal   Collection Time   12/23/11  2:00 AM      Component Value Range   TSH 0.618  0.350 - 4.500 uIU/mL  PRO B NATRIURETIC PEPTIDE     Status: Abnormal   Collection Time   12/23/11  2:00 AM      Component Value Range   Pro B Natriuretic peptide (BNP) 1346.0 (*) 0 - 125 pg/mL  HEMOGLOBIN A1C     Status: Abnormal   Collection Time   12/23/11  2:00 AM      Component Value Range   Hemoglobin A1C 6.1 (*) <5.7 %   Mean Plasma Glucose 128 (*) <117 mg/dL  HEPATIC FUNCTION PANEL     Status: Abnormal   Collection Time   12/23/11  2:00 AM      Component Value Range   Total Protein 6.0  6.0 - 8.3 g/dL   Albumin 2.9 (*) 3.5 - 5.2 g/dL   AST 16  0 - 37 U/L   ALT 6  0 - 35 U/L   Alkaline Phosphatase 99  39 - 117 U/L   Total Bilirubin 0.4  0.3 - 1.2 mg/dL   Bilirubin, Direct 0.1  0.0 - 0.3 mg/dL   Indirect Bilirubin 0.3  0.3 - 0.9 mg/dL  VITAMIN Z61     Status: Normal   Collection Time   12/23/11  2:00 AM      Component Value Range   Vitamin B-12 455  211 - 911 pg/mL  FOLATE     Status: Normal   Collection Time   12/23/11  2:00 AM      Component Value Range   Folate 9.0    IRON AND TIBC     Status: Abnormal   Collection Time   12/23/11  2:00 AM      Component Value Range   Iron 18 (*) 42 - 135 ug/dL   TIBC 096  045 - 409 ug/dL   Saturation Ratios 4 (*) 20 - 55 %   UIBC 396  125 - 400  ug/dL  FERRITIN     Status: Abnormal   Collection Time   12/23/11  2:00 AM      Component Value Range   Ferritin 5 (*) 10 - 291 ng/mL  RETICULOCYTES     Status: Abnormal   Collection Time   12/23/11  2:00 AM      Component Value Range   Retic Ct Pct 1.3  0.4 - 3.1 %   RBC. 2.92 (*) 3.87 - 5.11 MIL/uL   Retic Count, Manual 38.0  19.0 - 186.0 K/uL  BASIC METABOLIC PANEL     Status: Abnormal   Collection Time   12/23/11  2:00 AM      Component Value Range   Sodium 142  135 - 145 mEq/L   Potassium 3.3 (*) 3.5 - 5.1 mEq/L   Chloride 104  96 - 112 mEq/L   CO2 29  19 - 32 mEq/L   Glucose, Bld 151 (*) 70 - 99 mg/dL   BUN 14  6 - 23 mg/dL   Creatinine, Ser 4.09  0.50 - 1.10 mg/dL   Calcium 8.5  8.4 - 81.1 mg/dL   GFR calc non Af Amer >90  >90 mL/min   GFR calc Af Amer >90  >90 mL/min  PREPARE RBC (CROSSMATCH)     Status: Normal   Collection Time   12/23/11  2:05 AM      Component Value Range   Order Confirmation ORDER PROCESSED BY BLOOD BANK    TYPE AND SCREEN     Status: Normal (Preliminary result)   Collection Time   12/23/11  3:10 AM      Component Value Range   ABO/RH(D) A POS     Antibody Screen NEG     Sample Expiration 12/26/2011     Unit Number [id number]     Blood Component Type RED CELLS,LR     Unit division 00     Status of Unit ISSUED     Transfusion Status OK TO TRANSFUSE     Crossmatch Result Compatible    INFLUENZA PANEL BY PCR     Status: Normal   Collection Time   12/23/11  3:30 AM      Component Value Range   Influenza A By PCR NEGATIVE  NEGATIVE   Influenza B By PCR NEGATIVE  NEGATIVE   H1N1 flu by pcr NOT DETECTED  NOT DETECTED  CULTURE, EXPECTORATED SPUTUM-ASSESSMENT     Status: Normal   Collection Time   12/23/11  3:31 AM      Component Value Range   Specimen Description SPUTUM     Special Requests NONE     Sputum evaluation       Value: THIS SPECIMEN IS ACCEPTABLE. RESPIRATORY CULTURE REPORT TO FOLLOW.   Report Status 12/23/2011 FINAL    CULTURE,  RESPIRATORY     Status: Normal (Preliminary result)   Collection Time   12/23/11  3:31 AM      Component Value Range   Specimen Description SPUTUM     Special Requests NONE     Gram Stain       Value: MODERATE WBC PRESENT,BOTH PMN AND MONONUCLEAR     MODERATE SQUAMOUS EPITHELIAL CELLS PRESENT     NO ORGANISMS SEEN   Culture PENDING     Report Status PENDING    URINALYSIS, ROUTINE W REFLEX MICROSCOPIC     Status: Abnormal   Collection Time   12/23/11  3:48 AM      Component Value Range   Color, Urine YELLOW  YELLOW   APPearance CLOUDY (*) CLEAR   Specific Gravity, Urine 1.018  1.005 - 1.030   pH 6.0  5.0 - 8.0   Glucose, UA NEGATIVE  NEGATIVE mg/dL   Hgb urine dipstick NEGATIVE  NEGATIVE   Bilirubin Urine NEGATIVE  NEGATIVE   Ketones, ur NEGATIVE  NEGATIVE mg/dL   Protein, ur NEGATIVE  NEGATIVE mg/dL   Urobilinogen, UA 1.0  0.0 - 1.0 mg/dL   Nitrite POSITIVE (*) NEGATIVE   Leukocytes, UA MODERATE (*) NEGATIVE  URINE MICROSCOPIC-ADD ON     Status: Abnormal   Collection Time   12/23/11  3:48 AM      Component Value Range   Squamous Epithelial / LPF RARE  RARE   WBC, UA 7-10  <3 WBC/hpf   RBC / HPF  0-2  <3 RBC/hpf   Bacteria, UA MANY (*) RARE  TROPONIN I     Status: Normal   Collection Time   12/23/11  8:12 AM      Component Value Range   Troponin I <0.30  <0.30 ng/mL  CBC     Status: Abnormal   Collection Time   12/23/11  8:57 AM      Component Value Range   WBC 10.1  4.0 - 10.5 K/uL   RBC 3.10 (*) 3.87 - 5.11 MIL/uL   Hemoglobin 6.1 (*) 12.0 - 15.0 g/dL   HCT 95.2 (*) 84.1 - 32.4 %   MCV 69.7 (*) 78.0 - 100.0 fL   MCH 19.7 (*) 26.0 - 34.0 pg   MCHC 28.2 (*) 30.0 - 36.0 g/dL   RDW 40.1 (*) 02.7 - 25.3 %   Platelets 282  150 - 400 K/uL  MAGNESIUM     Status: Normal   Collection Time   12/23/11  8:57 AM      Component Value Range   Magnesium 2.2  1.5 - 2.5 mg/dL     Dg Chest 2 View  66/04/4032  *RADIOLOGY REPORT*  Clinical Data: Pneumonia; chest pain and shortness  of breath  CHEST - 2 VIEW  Comparison: December 22, 2011  Findings:  There is cardiomegaly with widespread interstitial and patchy alveolar edema, stable.  There is a degree of pulmonary venous hypertension.  There is no new opacity.  There is mild thoracic dextroscoliosis.  There is postoperative change in the lower cervical spine.  IMPRESSION: Stable chest.  Findings felt to represent congestive heart failure. Cannot exclude superimposed infectious pneumonia in the right upper lobe.   Original Report Authenticated By: Bretta Bang, M.D.    Dg Chest 2 View (if Patient Has Fever And/or Copd)  12/22/2011  *RADIOLOGY REPORT*  Clinical Data: Chest pain, shortness of breath, cough  CHEST - 2 VIEW  Comparison: 05/01/2011  Findings: Worsening diffuse airspace disease versus edema throughout both lungs.  Heart is enlarged.  Central vascularity prominent.  CHF is favored.  No large effusion or pneumothorax.  IMPRESSION: Worsening diffuse airspace process versus edema.  CHF is favored.   Original Report Authenticated By: Judie Petit. Shick, M.D.     ROS:  As stated above in the HPI otherwise negative.  Blood pressure 102/31, pulse 73, temperature 97.6 F (36.4 C), temperature source Oral, resp. rate 15, height 5\' 7"  (1.702 m), weight 141.1 kg (311 lb 1.1 oz), SpO2 97.00%.    PE: Gen: NAD, Alert and Oriented HEENT:  [place]/AT, EOMI Neck: Supple, no LAD Lungs: crackles at the bases bilaterally CV: RRR ABM: Soft, NTND, +BS Ext: No C/C/E  Assessment/Plan: 1) Microcytic anemia 2) History of GAVE 3) CHF   The patient does not feel well at this time, but her CXR seems to be a little improved.  Her lung fields are still congested with fluid and her BNP is elevated.  She requires an EGD, but appears to be more frail than I had anticipated.  I will perform an EGD, but it will most likely be on Thursday.  Plan: 1) Correct CHF. 2) Agree with blood transfusions. 3) EGD when patient is more stable. ?  Thursday.  [name] D 12/23/2011, 3:01 PM

## 2011-12-23 NOTE — Progress Notes (Signed)
UR Completed Felisa Zechman Graves-Bigelow, RN,BSN [phone]  

## 2011-12-23 NOTE — Progress Notes (Signed)
0830 acknowledged  when spoken to Dr. Everardo Beals . To transferr to step down for  Close monitoring . Pt seen by RRT staff . No intervention  Done . Marland Kitchen Blood transfusion completed without significance . Informed of progress and plan of care. amenable

## 2011-12-24 ENCOUNTER — Ambulatory Visit: Payer: PRIVATE HEALTH INSURANCE

## 2011-12-24 ENCOUNTER — Other Ambulatory Visit: Payer: PRIVATE HEALTH INSURANCE | Admitting: Lab

## 2011-12-24 LAB — PREPARE RBC (CROSSMATCH)

## 2011-12-24 LAB — COMPREHENSIVE METABOLIC PANEL
ALT: 5 U/L (ref 0–35)
AST: 9 U/L (ref 0–37)
Albumin: 2.7 g/dL — ABNORMAL LOW (ref 3.5–5.2)
Alkaline Phosphatase: 91 U/L (ref 39–117)
BUN: 13 mg/dL (ref 6–23)
Chloride: 100 mEq/L (ref 96–112)
Potassium: 3 mEq/L — ABNORMAL LOW (ref 3.5–5.1)
Sodium: 140 mEq/L (ref 135–145)
Total Bilirubin: 0.2 mg/dL — ABNORMAL LOW (ref 0.3–1.2)
Total Protein: 5.5 g/dL — ABNORMAL LOW (ref 6.0–8.3)

## 2011-12-24 LAB — CBC
HCT: 26.6 % — ABNORMAL LOW (ref 36.0–46.0)
Hemoglobin: 7.8 g/dL — ABNORMAL LOW (ref 12.0–15.0)
MCH: 21 pg — ABNORMAL LOW (ref 26.0–34.0)
MCHC: 29.3 g/dL — ABNORMAL LOW (ref 30.0–36.0)
Platelets: 264 10*3/uL (ref 150–400)
RBC: 3.14 MIL/uL — ABNORMAL LOW (ref 3.87–5.11)
RBC: 3.63 MIL/uL — ABNORMAL LOW (ref 3.87–5.11)
RDW: 22.4 % — ABNORMAL HIGH (ref 11.5–15.5)
WBC: 9.1 10*3/uL (ref 4.0–10.5)
WBC: 8.9 10*3/uL (ref 4.0–10.5)

## 2011-12-24 MED ORDER — ALBUTEROL SULFATE (5 MG/ML) 0.5% IN NEBU: 2.5000 mg | INHALATION_SOLUTION | Freq: Four times a day (QID) | RESPIRATORY_TRACT | Status: DC | PRN

## 2011-12-24 MED ORDER — POTASSIUM CHLORIDE CRYS ER 20 MEQ PO TBCR: 40.0000 meq | EXTENDED_RELEASE_TABLET | Freq: Every day | ORAL | Status: DC

## 2011-12-24 MED ORDER — POTASSIUM CHLORIDE CRYS ER 20 MEQ PO TBCR
40.0000 meq | EXTENDED_RELEASE_TABLET | Freq: Two times a day (BID) | ORAL | Status: AC
  Administered 2011-12-24 (×2): 40 meq via ORAL
  Filled 2011-12-24 (×2): qty 2

## 2011-12-24 MED ORDER — DEXTROMETHORPHAN POLISTIREX 30 MG/5ML PO LQCR
5.0000 mL | Freq: Two times a day (BID) | ORAL | Status: DC | PRN
  Administered 2011-12-28: 30 mg via ORAL
  Filled 2011-12-24 (×5): qty 5

## 2011-12-24 MED ORDER — ALBUTEROL SULFATE (5 MG/ML) 0.5% IN NEBU
2.5000 mg | INHALATION_SOLUTION | Freq: Three times a day (TID) | RESPIRATORY_TRACT | Status: DC
  Administered 2011-12-24 – 2011-12-29 (×13): 2.5 mg via RESPIRATORY_TRACT
  Filled 2011-12-24 (×19): qty 0.5

## 2011-12-24 MED ORDER — FUROSEMIDE 10 MG/ML IJ SOLN
20.0000 mg | Freq: Once | INTRAMUSCULAR | Status: AC
  Administered 2011-12-24: 20 mg via INTRAVENOUS

## 2011-12-24 MED ORDER — POTASSIUM CHLORIDE CRYS ER 20 MEQ PO TBCR
40.0000 meq | EXTENDED_RELEASE_TABLET | Freq: Three times a day (TID) | ORAL | Status: DC
  Administered 2011-12-25 – 2011-12-28 (×10): 40 meq via ORAL
  Filled 2011-12-24 (×15): qty 2

## 2011-12-24 MED ORDER — IPRATROPIUM BROMIDE 0.02 % IN SOLN
0.5000 mg | Freq: Three times a day (TID) | RESPIRATORY_TRACT | Status: DC
  Administered 2011-12-24 – 2011-12-29 (×13): 0.5 mg via RESPIRATORY_TRACT
  Filled 2011-12-24 (×19): qty 2.5

## 2011-12-24 NOTE — Progress Notes (Addendum)
Subjective: Patient feels weak.  She states she feels sob and has chest pain with dry cough.  She reports being dizzy with positional changes.  At home she is mostly sedentary except when her aid is there.  She wants to use the bed pan instead of the bedside commode.   Denies bowel movement in 24 hours   Objective: Vital signs in last 24 hours: Filed Vitals:   12/24/11 0500 12/24/11 0537 12/24/11 0624 12/24/11 0745  BP:  94/53 98/85 113/52  Pulse:  76 74 75  Temp:  98.3 F (36.8 C) 98.5 F (36.9 C) 98.9 F (37.2 C)  TempSrc:  Oral Oral Oral  Resp:  14 16 18   Height:      Weight: 309 lb 1.4 oz (140.2 kg)     SpO2:    93%   Weight change: -1 lb 1.6 oz (-0.5 kg)  Intake/Output Summary (Last 24 hours) at 12/24/11 9604 Last data filed at 12/24/11 0700  Gross per 24 hour  Intake 1844.16 ml  Output   2850 ml  Net -1005.84 ml   General:lying in bed, nad, alert and oriented HEENT:/at CV: RRR no rubs, murmurs or gallops Lungs: wheezes bilaterally  Abdomen: obese, soft, ntnd, normal bowel sounds Extremities: warm, no cyanosis or edema Neuro: CN 2-12 grossly intact able to move all 4 extremities. Skin: bandage to right buttock   Lab Results: Basic Metabolic Panel:  Lab 12/24/11 5409 12/23/11 0857 12/23/11 0200  NA 140 -- 142  K 3.0* -- 3.3*  CL 100 -- 104  CO2 32 -- 29  GLUCOSE 118* -- 151*  BUN 13 -- 14  CREATININE 0.72 -- 0.60  CALCIUM 8.3* -- 8.5  MG -- 2.2 --  PHOS -- -- --   Liver Function Tests:  Lab 12/24/11 0120 12/23/11 0200  AST 9 16  ALT 5 6  ALKPHOS 91 99  BILITOT 0.2* 0.4  PROT 5.5* 6.0  ALBUMIN 2.7* 2.9*   CBC:  Lab 12/24/11 0120 12/23/11 1452 12/23/11 0200  WBC 9.1 10.8* --  NEUTROABS -- -- 9.5*  HGB 6.6* 6.2* --  HCT 22.6* 22.0* --  MCV 72.0* 69.6* --  PLT 264 274 --   Cardiac Enzymes:  Lab 12/23/11 2252 12/23/11 1451 12/23/11 0812  CKTOTAL -- -- --  CKMB -- -- --  CKMBINDEX -- -- --  TROPONINI <0.30 <0.30 <0.30   BNP:  Lab  12/23/11 0200  PROBNP 1346.0*   Hemoglobin A1C:  Lab 12/23/11 0200  HGBA1C 6.1*   Thyroid Function Tests:  Lab 12/23/11 0200  TSH 0.618  T4TOTAL --  FREET4 --  T3FREE --  THYROIDAB --   Coagulation:  Lab 12/22/11 1700  LABPROT 23.8*  INR 2.24*   Anemia Panel:  Lab 12/23/11 0200  VITAMINB12 455  FOLATE 9.0  FERRITIN 5*  TIBC 414  IRON 18*  RETICCTPCT 1.3   Urinalysis:  Lab 12/23/11 0348  COLORURINE YELLOW  LABSPEC 1.018  PHURINE 6.0  GLUCOSEU NEGATIVE  HGBUR NEGATIVE  BILIRUBINUR NEGATIVE  KETONESUR NEGATIVE  PROTEINUR NEGATIVE  UROBILINOGEN 1.0  NITRITE POSITIVE*  LEUKOCYTESUR MODERATE*   Misc. Labs: Urine culture, respiratory culture  Micro Results: Recent Results (from the past 240 hour(s))  MRSA PCR SCREENING     Status: Abnormal   Collection Time   12/23/11 12:22 AM      Component Value Range Status Comment   MRSA by PCR POSITIVE (*) NEGATIVE Final   CULTURE, EXPECTORATED SPUTUM-ASSESSMENT     Status:  Normal   Collection Time   12/23/11  3:31 AM      Component Value Range Status Comment   Specimen Description SPUTUM   Final    Special Requests NONE   Final    Sputum evaluation     Final    Value: THIS SPECIMEN IS ACCEPTABLE. RESPIRATORY CULTURE REPORT TO FOLLOW.   Report Status 12/23/2011 FINAL   Final   CULTURE, RESPIRATORY     Status: Normal (Preliminary result)   Collection Time   12/23/11  3:31 AM      Component Value Range Status Comment   Specimen Description SPUTUM   Final    Special Requests NONE   Final    Gram Stain     Final    Value: MODERATE WBC PRESENT,BOTH PMN AND MONONUCLEAR     MODERATE SQUAMOUS EPITHELIAL CELLS PRESENT     NO ORGANISMS SEEN   Culture Culture reincubated for better growth   Final    Report Status PENDING   Incomplete    Studies/Results: Dg Chest 2 View  12/23/2011  *RADIOLOGY REPORT*  Clinical Data: Pneumonia; chest pain and shortness of breath  CHEST - 2 VIEW  Comparison: December 22, 2011  Findings:   There is cardiomegaly with widespread interstitial and patchy alveolar edema, stable.  There is a degree of pulmonary venous hypertension.  There is no new opacity.  There is mild thoracic dextroscoliosis.  There is postoperative change in the lower cervical spine.  IMPRESSION: Stable chest.  Findings felt to represent congestive heart failure. Cannot exclude superimposed infectious pneumonia in the right upper lobe.   Original Report Authenticated By: Bretta Bang, M.D.    Dg Chest 2 View (if Patient Has Fever And/or Copd)  12/22/2011  *RADIOLOGY REPORT*  Clinical Data: Chest pain, shortness of breath, cough  CHEST - 2 VIEW  Comparison: 05/01/2011  Findings: Worsening diffuse airspace disease versus edema throughout both lungs.  Heart is enlarged.  Central vascularity prominent.  CHF is favored.  No large effusion or pneumothorax.  IMPRESSION: Worsening diffuse airspace process versus edema.  CHF is favored.   Original Report Authenticated By: Judie Petit. Miles Costain, M.D.    Medications:  Scheduled Meds:    . albuterol  2.5 mg Nebulization TID  . cefTRIAXone (ROCEPHIN)  IV  2 g Intravenous QHS  . Chlorhexidine Gluconate Cloth  6 each Topical Q0600  . ferrous sulfate  325 mg Oral TID WC  . [COMPLETED] furosemide  20 mg Intravenous Once  . furosemide  20 mg Intravenous Once  . [COMPLETED] furosemide  20 mg Intravenous Once  . furosemide  40 mg Intravenous BID  . gabapentin  100 mg Oral TID  . influenza  inactive virus vaccine  0.5 mL Intramuscular Tomorrow-1000  . ipratropium  0.5 mg Nebulization TID  . lamoTRIgine  200 mg Oral q morning - 10a  . mupirocin ointment  1 application Nasal BID  . pantoprazole (PROTONIX) IV  40 mg Intravenous Q12H  . pneumococcal 23 valent vaccine  0.5 mL Intramuscular Tomorrow-1000  . potassium chloride SA  40 mEq Oral BID  . potassium chloride  40 mEq Oral Daily  . QUEtiapine  400 mg Oral QHS  . sertraline  200 mg Oral Daily  . simvastatin  20 mg Oral q1800  . sodium  chloride  3 mL Intravenous Q12H  . sodium chloride  3 mL Intravenous Q12H  . [DISCONTINUED] albuterol  2 puff Inhalation QID  . [DISCONTINUED] albuterol  2.5 mg Nebulization  Q6H  . [DISCONTINUED] azithromycin (ZITHROMAX) 500 MG IVPB  500 mg Intravenous Q24H  . 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  She presents for weakness, shortness of breath and cough noted to have a hemoglobin of 4.8 on admission and a history of black bowel movements prior to admission.    1. Anemia with acute blood loss and iron deficiency -Hemoglobin 4.8 on admission.   -She has been transfused 4-5 units of blood with hemoglobin as of 120 am 12/4 6.6 -Etiology of blood loss could be secondary to gastric antral vascular ectasia (GAVE) noted by Dr. Elnoria Howard EGD 06/2008.  Repeat anemia panel indicates iron deficiency anemia as well.  GAVE cane cause iron deficiency anemia, slow chronic GI blood loss -FOBT negative 12/22/11 -GI Dr. Elnoria Howard following; will possibly do a EGD procedure Thursday -pending CBC q  8 -goal transfuse if hemoglobin <7  -pending repeat CBC 9:30 AM -GI recommends transfusing 2 more units.  During transfusions will diurese as patient has acute CHF.  -Protonix 40 mg iv q 12 hours   2. Acute CHF secondary to high output heart failure due to anemia -continue to diurese between transfusions  -fluid restricted diet  -Lasix 40 mg iv bid  -strict i/o; out 2.9 L over 24 hours net negative 2.23 L, daily weights (down 2 lbs since admission  311-->309 lbs) -CE negative x 3  -consider repeat echo once acute exacerbation resolved   3. Chronic respiratory failure secondary to interstitial lung disease on home O2 and tobacco abuse -pfts do no indicate obstructive disease they indicate restrictive lung disease  -Will continue Albuterol and Atrovent nebs tid, and Albuterol neb prn -Patient is wheezing on exam.  Could consider short course of steroids.   -smoking cessation material will be given  4. History of HYPERTENSION but slightly hypotensive this admission -trending up 90s-120s/30s-50s -will monitor -no antihypertensives   5. History of thromboembolism previously on Coumadin -INR 2.24 on 12/2, holding Coumadin due to acute blood loss.   6. Possible UTI -with symptoms of dysuria -pending urine culture -Continue Rocephin Day 2   7. MRSA + nares -protocol  8. DVT Px  -scds   9. F/E/N -will monitor electrolytes; hypokalemic probably secondary to being Lasix naive and getting Lasix this admission (3.0 today; replaced with oral K 40 meQ bid  X 3 doses today then will give  Kdur 40 mg qd -fluid restricted heart diet 1 L   Dispo: Disposition is deferred at this time, awaiting improvement of current medical problems.  Anticipated discharge in approximately 2-3 day(s).   The patient does have a current PCP (KLIMA, LAWRENCE D, MD), therefore is requiring OPC follow-up after discharge.   The patient does have transportation limitations that hinder transportation to clinic appointments.  .Services Needed at time of discharge: Y = Yes, Blank = No PT:   OT:   RN:   Equipment:   Other:     LOS: 2 days   Annett Gula [phone] 12/24/2011, 8:08 AM

## 2011-12-24 NOTE — Progress Notes (Signed)
Subjective: Still SOB.  Not feeling well.  Objective: Vital signs in last 24 hours: Temp:  [97.6 F (36.4 C)-99.3 F (37.4 C)] 98.9 F (37.2 C) (12/04 0745) Pulse Rate:  [72-78] 75  (12/04 0745) Resp:  [14-20] 18  (12/04 0745) BP: (94-136)/(31-112) 113/52 mmHg (12/04 0745) SpO2:  [87 %-100 %] 93 % (12/04 0745) Weight:  [140.2 kg (309 lb 1.4 oz)-140.6 kg (309 lb 15.5 oz)] 140.2 kg (309 lb 1.4 oz) (12/04 0500) Last BM Date: 12/22/11  Intake/Output from previous day: 12/03 0701 - 12/04 0700 In: 1844.2 [P.O.:920; I.V.:300; Blood:614.2; IV Piggyback:10] Out: 2925 [Urine:2925] Intake/Output this shift:    General appearance: alert and no distress GI: soft, non-tender; bowel sounds normal; no masses,  no organomegaly Lungs:  End expiratory wheezing  Lab Results:  Basename 12/24/11 0120 12/23/11 1452 12/23/11 0857  WBC 9.1 10.8* 10.1  HGB 6.6* 6.2* 6.1*  HCT 22.6* 22.0* 21.6*  PLT 264 274 282   BMET  Basename 12/24/11 0120 12/23/11 0200 12/22/11 1948 12/22/11 1700  NA 140 142 140 --  K 3.0* 3.3* 3.2* --  CL 100 104 103 --  CO2 32 29 -- 28  GLUCOSE 118* 151* 160* --  BUN 13 14 15  --  CREATININE 0.72 0.60 0.80 --  CALCIUM 8.3* 8.5 -- 8.6   LFT  Basename 12/24/11 0120 12/23/11 0200  PROT 5.5* --  ALBUMIN 2.7* --  AST 9 --  ALT 5 --  ALKPHOS 91 --  BILITOT 0.2* --  BILIDIR -- 0.1  IBILI -- 0.3   PT/INR  Basename 12/22/11 1700  LABPROT 23.8*  INR 2.24*   Hepatitis Panel No results found for this basename: HEPBSAG,HCVAB,HEPAIGM,HEPBIGM in the last 72 hours C-Diff No results found for this basename: CDIFFTOX:3 in the last 72 hours Fecal Lactopherrin No results found for this basename: FECLLACTOFRN in the last 72 hours  Studies/Results: Dg Chest 2 View  12/23/2011  *RADIOLOGY REPORT*  Clinical Data: Pneumonia; chest pain and shortness of breath  CHEST - 2 VIEW  Comparison: December 22, 2011  Findings:  There is cardiomegaly with widespread interstitial and  patchy alveolar edema, stable.  There is a degree of pulmonary venous hypertension.  There is no new opacity.  There is mild thoracic dextroscoliosis.  There is postoperative change in the lower cervical spine.  IMPRESSION: Stable chest.  Findings felt to represent congestive heart failure. Cannot exclude superimposed infectious pneumonia in the right upper lobe.   Original Report Authenticated By: Bretta Bang, M.D.    Dg Chest 2 View (if Patient Has Fever And/or Copd)  12/22/2011  *RADIOLOGY REPORT*  Clinical Data: Chest pain, shortness of breath, cough  CHEST - 2 VIEW  Comparison: 05/01/2011  Findings: Worsening diffuse airspace disease versus edema throughout both lungs.  Heart is enlarged.  Central vascularity prominent.  CHF is favored.  No large effusion or pneumothorax.  IMPRESSION: Worsening diffuse airspace process versus edema.  CHF is favored.   Original Report Authenticated By: Judie Petit. Miles Costain, M.D.     Medications:  Scheduled:   . albuterol  2.5 mg Nebulization TID  . cefTRIAXone (ROCEPHIN)  IV  2 g Intravenous QHS  . Chlorhexidine Gluconate Cloth  6 each Topical Q0600  . ferrous sulfate  325 mg Oral TID WC  . [COMPLETED] furosemide  20 mg Intravenous Once  . furosemide  20 mg Intravenous Once  . [COMPLETED] furosemide  20 mg Intravenous Once  . furosemide  40 mg Intravenous BID  . gabapentin  100 mg Oral TID  . influenza  inactive virus vaccine  0.5 mL Intramuscular Tomorrow-1000  . ipratropium  0.5 mg Nebulization TID  . lamoTRIgine  200 mg Oral q morning - 10a  . mupirocin ointment  1 application Nasal BID  . pantoprazole (PROTONIX) IV  40 mg Intravenous Q12H  . pneumococcal 23 valent vaccine  0.5 mL Intramuscular Tomorrow-1000  . potassium chloride SA  40 mEq Oral BID  . potassium chloride  40 mEq Oral Daily  . QUEtiapine  400 mg Oral QHS  . sertraline  200 mg Oral Daily  . simvastatin  20 mg Oral q1800  . sodium chloride  3 mL Intravenous Q12H  . sodium chloride  3 mL  Intravenous Q12H  . [DISCONTINUED] albuterol  2 puff Inhalation QID  . [DISCONTINUED] albuterol  2.5 mg Nebulization Q6H  . [DISCONTINUED] azithromycin (ZITHROMAX) 500 MG IVPB  500 mg Intravenous Q24H  . [DISCONTINUED] ipratropium  0.5 mg Nebulization Q6H   Continuous:   Assessment/Plan: 1) Anemia. 2) SOB - Wheezing.   HGB is at 6.6.  She would benefit from an additional two units of PRBC.  Her wheezing is still a problem for her.  She is on nebulizers TID.  Pulse ox is stable.  Her fluid overload is much better at this time.  No crackles noted at the bases compared to yesterday's exam.  Plan: 1) EGD tomorrow. 2) Recommend to transfuse and additional two units of PRBC. 3) ? Intensify treatment for her asthma/COPD.  LOS: 2 days   [place] Hijazi D 12/24/2011, 8:03 AM

## 2011-12-24 NOTE — Progress Notes (Signed)
Internal Medicine Teaching Service Attending Note Date: 12/24/2011  Patient name: [name]  Medical record number: [id number]  Date of birth: 05-19-1945    This patient has been seen and discussed with the house staff. Please see their note for complete details. I concur with their findings with the following additions/corrections: Patient continues to have cough. No GI bleed, chest pain noted. Patient is severely deconditioned at this time. Plan to do upper GI endoscopy tomorrow morning. Patient's hemoglobin is below 7 and the plan to transfuse 2 units today. Patient is -2.2 L yesterday and her chest x-ray shows improved aeration. Will continue Lasix at 40 mg IV twice a day at this time. Patient's creatinine is 0.7 today. Continue to monitor and step down until the upper GI procedure and then transfer to MedSurg bed. Hypokalemia secondary to Lasix will be corrected with oral repletion. Patient is currently on ceftriaxone for presumed UTI. Patient is currently afebrile. We will continue to monitor.  Lars Mage 12/24/2011, 10:49 AM

## 2011-12-24 NOTE — Progress Notes (Signed)
Patient refuses to stand up to do orthostatics. Dr Shirlee Latch aware.

## 2011-12-24 NOTE — Progress Notes (Signed)
Transfuse 1 unit of blood now please get post transfusion CBC. Another 1 unit around 7 PM with post transfusion CBC.  Total of Kdur 3 doses of 40 meQ today. Then Kdur 40 mEQ qd tomorrow.  Shirlee Latch MD

## 2011-12-24 NOTE — Clinical Documentation Improvement (Signed)
RESPIRATORY FAILURE DOCUMENTATION CLARIFICATION QUERY   THIS DOCUMENT IS NOT A PERMANENT PART OF THE MEDICAL RECORD   Please update your documentation within the medical record to reflect your response to this query.                                                                                     12/24/11  Dr. 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, interstitial lung disease, OSA not on CPAP...Marland KitchenMarland KitchenShe is on home oxygen 2L and has not increased her O2." [name]  12/22/2011, 10:08 PM H&P  Hospital Medications Include: Proventil Atrovent Oxygen     Based on your clinical judgment, please document in the progress notes and discharge summary if a condition below provides greater specificity regarding the patient's respiratory status:   - Chronic Respiratory Failure requiring use of home 02   - Other Condition   - Unable to Clinically Determine    In responding to this query please exercise your independent judgment.    The fact that a query is asked, does not imply that any particular answer is desired or expected.    Reviewed: additional documentation in the medical record  Thank You,  Jerral Ralph  RN BSN CCDS Certified Clinical Documentation Specialist: Cell   [phone]  Health Information Management [place]  TO RESPOND TO THE THIS QUERY, FOLLOW THE INSTRUCTIONS BELOW:  1. If needed, update documentation for the patient's encounter via the notes activity.  2. Access this query again and click edit on the In [employer].  3. After updating, or not,  click F2 to complete all highlighted (required) fields concerning your review. Select "additional documentation in the medical record" OR "no additional documentation provided".  4. Click Sign note button.  5. The deficiency will fall out of your In Basket *Please let us know if you are not able to complete this workflow by phone or e-mail (listed below).

## 2011-12-24 NOTE — Clinical Documentation Improvement (Signed)
GENERIC DOCUMENTATION CLARIFICATION QUERY  THIS DOCUMENT IS NOT A PERMANENT PART OF THE MEDICAL RECORD  Please update your documentation within the medical record to reflect your response to this query.                                                                                         12/24/11   Dr. Desma Maxim and/or Associates,  In a better effort to capture your patient's severity of illness, reflect appropriate length of stay and utilization of resources, a review of the patient medical record has revealed the following indicators.    "Interstitial Lung Disease/?CAP: coarse breath sounds with mild wheezing which is consistent with ILD but cannot exclude PNA since CXR is very congested. Will give IV lasix and repeat CXR in AM. PFTs In 04/2011 shows decreased diffusion capacity, spirometry is consistent with restrictive lung disease and FEV1 and FVC are decreased compared with 08/2005. Will treat with nebs, ceftriaxone & azithromycin." Sara Chu D  12/22/2011, 10:08 PM  H&P  12/22/2011 *RADIOLOGY REPORT IMPRESSION: Worsening diffuse airspace process versus edema. CHF is favored.  Original Report Authenticated By: Judie Petit. Shick, M.D.   "not tachypneic or respiratory distress, no lower extremitiy edema but elevated BNP 1346. CXR suggestive of CHF or right upper lobe superimposed pneumonia"  Annett Gula [phone]  12/23/2011, 3:00 PM H&P  12/23/2011 *RADIOLOGY REPORT IMPRESSION: Stable chest. Findings felt to represent congestive heart failure. Cannot exclude superimposed infectious pneumonia in the right upper lobe.  Original Report Authenticated By: Bretta Bang, M.D.   Currently continues on Rocephin 2gm IV daily per [facility]   Based on your clinical judgment, please document in the progress notes and discharge summary if the diagnosis of "?CAP" was:    - Confirmed and Treated   - Possible, Probable, or Suspected and Treated   - Ruled Out   In responding to this  query please exercise your independent judgment.    The fact that a query is asked, does not imply that any particular answer is desired or expected.   Reviewed: additional documentation in the medical record see 12/25/11 noted   Thank You,  Jerral Ralph  RN BSN CCDS Certified Clinical Documentation Specialist: Cell   [phone]  Health Information Management [place]  TO RESPOND TO THE THIS QUERY, FOLLOW THE INSTRUCTIONS BELOW:  1. If needed, update documentation for the patient's encounter via the notes activity.  2. Access this query again and click edit on the In [employer].  3. After updating, or not, click F2 to complete all highlighted (required) fields concerning your review. Select "additional documentation in the medical record" OR "no additional documentation provided".  4. Click Sign note button.  5. The deficiency will fall out of your In Basket *Please let us know if you are not able to complete this workflow by phone or e-mail (listed below).

## 2011-12-25 ENCOUNTER — Encounter (HOSPITAL_COMMUNITY)
Admission: EM | Disposition: A | Discharge status: Home Health | Payer: Self-pay | Source: Home / Self Care | Attending: Internal Medicine

## 2011-12-25 ENCOUNTER — Encounter (HOSPITAL_COMMUNITY): Payer: Self-pay | Admitting: Gastroenterology

## 2011-12-25 DIAGNOSIS — D509 Iron deficiency anemia, unspecified: Secondary | ICD-10-CM

## 2011-12-25 DIAGNOSIS — A498 Other bacterial infections of unspecified site: Secondary | ICD-10-CM

## 2011-12-25 DIAGNOSIS — J9610 Chronic respiratory failure, unspecified whether with hypoxia or hypercapnia: Secondary | ICD-10-CM

## 2011-12-25 DIAGNOSIS — N390 Urinary tract infection, site not specified: Secondary | ICD-10-CM

## 2011-12-25 HISTORY — PX: ESOPHAGOGASTRODUODENOSCOPY: SHX5428

## 2011-12-25 LAB — TYPE AND SCREEN
ABO/RH(D): A POS
Antibody Screen: NEGATIVE
Unit division: 0
Unit division: 0

## 2011-12-25 LAB — URINE CULTURE: Colony Count: >=100000

## 2011-12-25 LAB — BASIC METABOLIC PANEL
CO2: 33 mEq/L — ABNORMAL HIGH (ref 19–32)
Chloride: 99 mEq/L (ref 96–112)
Chloride: 99 mEq/L (ref 96–112)
Creatinine, Ser: 0.84 mg/dL (ref 0.50–1.10)
GFR calc Af Amer: 82 mL/min — ABNORMAL LOW (ref >90)
GFR calc Af Amer: >90 mL/min (ref >90)
GFR calc non Af Amer: 70 mL/min — ABNORMAL LOW (ref >90)
Potassium: 3.7 mEq/L (ref 3.5–5.1)
Potassium: 4.2 mEq/L (ref 3.5–5.1)
Sodium: 141 mEq/L (ref 135–145)

## 2011-12-25 LAB — CBC
HCT: 30.7 % — ABNORMAL LOW (ref 36.0–46.0)
MCH: 22.7 pg — ABNORMAL LOW (ref 26.0–34.0)
MCHC: 30 g/dL (ref 30.0–36.0)
Platelets: 269 10*3/uL (ref 150–400)
Platelets: 279 10*3/uL (ref 150–400)
RBC: 4.22 MIL/uL (ref 3.87–5.11)
RDW: 23.1 % — ABNORMAL HIGH (ref 11.5–15.5)
RDW: 23.3 % — ABNORMAL HIGH (ref 11.5–15.5)
WBC: 11.5 10*3/uL — ABNORMAL HIGH (ref 4.0–10.5)

## 2011-12-25 LAB — CBC WITH DIFFERENTIAL/PLATELET
Basophils Absolute: 0 10*3/uL (ref 0.0–0.1)
Eosinophils Relative: 2 % (ref 0–5)
HCT: 34.6 % — ABNORMAL LOW (ref 36.0–46.0)
Lymphocytes Relative: 11 % — ABNORMAL LOW (ref 12–46)
Lymphs Abs: 1.3 10*3/uL (ref 0.7–4.0)
MCV: 75.9 fL — ABNORMAL LOW (ref 78.0–100.0)
Monocytes Relative: 7 % (ref 3–12)
Neutro Abs: 9.2 10*3/uL — ABNORMAL HIGH (ref 1.7–7.7)
RBC: 4.56 MIL/uL (ref 3.87–5.11)
RDW: 24.4 % — ABNORMAL HIGH (ref 11.5–15.5)
WBC: 11.5 10*3/uL — ABNORMAL HIGH (ref 4.0–10.5)

## 2011-12-25 LAB — CULTURE, RESPIRATORY W GRAM STAIN: Culture: NORMAL

## 2011-12-25 LAB — PROTIME-INR: Prothrombin Time: 16.5 seconds — ABNORMAL HIGH (ref 11.6–15.2)

## 2011-12-25 SURGERY — EGD (ESOPHAGOGASTRODUODENOSCOPY)
Anesthesia: Moderate Sedation

## 2011-12-25 MED ORDER — MIDAZOLAM HCL 5 MG/ML IJ SOLN
INTRAMUSCULAR | Status: AC
  Filled 2011-12-25: qty 2

## 2011-12-25 MED ORDER — HYDROCODONE-ACETAMINOPHEN 5-325 MG PO TABS
1.0000 | ORAL_TABLET | Freq: Four times a day (QID) | ORAL | Status: DC | PRN
  Administered 2011-12-25 – 2011-12-26 (×2): 1 via ORAL
  Filled 2011-12-25 (×2): qty 1

## 2011-12-25 MED ORDER — MIDAZOLAM HCL 10 MG/2ML IJ SOLN
INTRAMUSCULAR | Status: DC | PRN
  Administered 2011-12-25 (×2): 2 mg via INTRAVENOUS

## 2011-12-25 MED ORDER — FENTANYL CITRATE 0.05 MG/ML IJ SOLN
INTRAMUSCULAR | Status: DC | PRN
  Administered 2011-12-25 (×2): 25 ug via INTRAVENOUS

## 2011-12-25 MED ORDER — BUTAMBEN-TETRACAINE-BENZOCAINE 2-2-14 % EX AERO
INHALATION_SPRAY | CUTANEOUS | Status: DC | PRN
  Administered 2011-12-25: 2 via TOPICAL

## 2011-12-25 MED ORDER — SODIUM CHLORIDE 0.9 % IV SOLN
INTRAVENOUS | Status: DC
  Administered 2011-12-25: 10 mL/h via INTRAVENOUS

## 2011-12-25 MED ORDER — FENTANYL CITRATE 0.05 MG/ML IJ SOLN
INTRAMUSCULAR | Status: AC
  Filled 2011-12-25: qty 2

## 2011-12-25 NOTE — Op Note (Signed)
[name] [facility] [address] [place], [address]   OPERATIVE PROCEDURE REPORT  PATIENT: Jennifer, [name]  MR#: [id number] BIRTHDATE: 08-23-1945  GENDER: Female ENDOSCOPIST: Jeani Hawking, MD ASSISTANT:   Karie Soda, technician and Darral Dash, RN PROCEDURE DATE: 12/25/2011 PROCEDURE:   EGD w/ ablation ASA CLASS:   Class IV INDICATIONS:Iron deficiency anemia. MEDICATIONS: Versed 4 mg IV and Fentanyl 50 mcg IV TOPICAL ANESTHETIC:   Cetacaine Spray  DESCRIPTION OF PROCEDURE:   After the risks benefits and alternatives of the procedure were thoroughly explained, informed consent was obtained.  The Pentax Gastroscope [id number]  endoscope was introduced through the mouth  and advanced to the second portion of the duodenum Without limitations.      The instrument was slowly withdrawn as the mucosa was fully examined.      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  The area was extensively treated with APC.  No further bleeding was noted.  No other abnormalities noted in the upper GI tract.   Retroflexed views revealed no abnormalities.     The scope was then withdrawn from the patient and the procedure terminated.  COMPLICATIONS: There were no complications. IMPRESSION: 1) GAVE with active oozing.  RECOMMENDATIONS: 1) Monitor HGB and transfuse as necessary. 2) Repeat EGD with APC in one month. 3) Avoid anticoagulation if possible.   _______________________________ eSigned:  Jeani Hawking, MD 12/25/2011 4:21 PM

## 2011-12-25 NOTE — Evaluation (Signed)
Occupational Therapy Evaluation Patient Details Name: [name] MRN: [id number] DOB: 12-May-1945 Today's Date: 12/25/2011 Time: 4098-1191 OT Time Calculation (min): 45 min  OT Assessment / Plan / Recommendation Clinical Impression  Pt is a 66 yr old female admitted with anemia, CHF, and questionable PNA.  Presents with increased left wrist and hand pain as well as decreased AROM in her hand and wrist.  Pt reports carpalectomy in July 2013 and recent fall with distal radius fracture in September.  Pt with limited use of the LUE functionally secondary to increased pain .  Currently needs min assist for toilet transfers and min to mod assist overall for simulated selfcare tasks.  She has assistance from a home health aide 7 days a week for 4 hours to assist with selfcare tasks but currently needs slightly more assist than normal .  Feel she will benefit from acute care OT to help increase independence with basic selfcare tasks in order to return home.  Pt states she will refuse to go to rehab but is open to home health.  I think the bigger issue is the need for input from [name]  on what she is allowed to do with her hand/wrist since this is not documented in the chart.  Would benefit from ortho consult for hand treatment/ ROM recommendations.  Anticipate the need for HHOT as well at discharge.    OT Assessment  Patient needs continued OT Services    Follow Up Recommendations  Home health OT;Supervision/Assistance - 24 hour    Barriers to Discharge Decreased caregiver support Pt has assistance for 4 hrs a day but not 24 hour  Equipment Recommendations  Other (comment) (bariatric 3:1)    Recommendations for Other Services Other (comment) (consult for Dr Melvyn [name] to advise on hand/wrist treatment)  Frequency  Min 2X/week    Precautions / Restrictions Precautions Precautions: Fall Precaution Comments: left wrist fracture in september and carpalectomy in july, unsure of precautions, pt  reports she is not supposed to put weight on the wrist. Restrictions Weight Bearing Restrictions: No Other Position/Activity Restrictions: pt reports old left wrist fracture and that she isn't supposed to be using this wrist but has been using it for transfers regardless   Pertinent Vitals/Pain O2 sats 92-93% on 2Ls nasal cannula, decreased to 87% on room air, HR 84    ADL  Eating/Feeding: Simulated;Set up Where Assessed - Eating/Feeding: Edge of bed Grooming: Simulated;Set up Where Assessed - Grooming: Unsupported sitting Upper Body Bathing: Simulated;Set up Where Assessed - Upper Body Bathing: Unsupported sitting Lower Body Bathing: Simulated;Moderate assistance Where Assessed - Lower Body Bathing: Unsupported sit to stand Upper Body Dressing: Simulated;Supervision/safety;Other (comment) (for pullover gowns) Where Assessed - Upper Body Dressing: Unsupported sitting Lower Body Dressing: Simulated;Maximal assistance Where Assessed - Lower Body Dressing: Unsupported sit to stand Toilet Transfer: Simulated;Minimal assistance Toilet Transfer Method: Stand pivot Toilet Transfer Equipment: Bedside commode Toileting - Clothing Manipulation and Hygiene: Simulated;Minimal assistance Where Assessed - Engineer, mining and Hygiene: Sit to stand from 3-in-1 or toilet Tub/Shower Transfer Method: Not assessed Transfers/Ambulation Related to ADLs: Pt min assist for stand pivot transfer to bedside commode or recliner.  Did not attempt ambulation. ADL Comments: Pt with history of left hand carpalectomy and also recent fall with distal radius fracture in september.  She currently states she is not supposed to be putting weight on her left wrist per the MD when she went to the emergency room   She was supposed to follow up with  [name] who did  her surgery  in July for the carpalectomy but unsure if this happened.  Pt currently with limited left hand and wrist movememnt and function.  Need  clarification on weightbearing and if we can pursue aggressive AROM.  Has home health aide to assist with IADLs and ADLs 4 hours per day.  Pt currently using the LUE to put weight on during transfers because she cannot transfer without assistance unless she does.      OT Diagnosis: Generalized weakness;Acute pain  OT Problem List: Decreased strength;Decreased range of motion;Impaired balance (sitting and/or standing);Pain;Decreased coordination;Decreased activity tolerance;Obesity OT Treatment Interventions: Self-care/ADL training;Therapeutic exercise;DME and/or AE instruction;Patient/family education;Balance training;Splinting;Therapeutic activities   OT Goals Acute Rehab OT Goals OT Goal Formulation: With patient Time For Goal Achievement: 01/08/12 Potential to Achieve Goals: Good ADL Goals Pt Will Perform Lower Body Bathing: with supervision;Sit to stand from bed;with adaptive equipment ADL Goal: Lower Body Bathing - Progress: Goal set today Pt Will Perform Lower Body Dressing: with supervision;with adaptive equipment;Sit to stand from bed ADL Goal: Lower Body Dressing - Progress: Goal set today Pt Will Transfer to Toilet: with supervision;with DME;Extra wide 3-in-1 ADL Goal: Toilet Transfer - Progress: Goal set today Pt Will Perform Toileting - Hygiene: with supervision;Sit to stand from 3-in-1/toilet ADL Goal: Toileting - Hygiene - Progress: Goal set today Miscellaneous OT Goals Miscellaneous OT Goal #1: Pt will tolerate PROM to left wrist and fingers as MD allows. OT Goal: Miscellaneous Goal #1 - Progress: Goal set today Miscellaneous OT Goal #2: Pt will increase LUE digit flexion AROM to 50% of normal for greater use with seflcare tasks. OT Goal: Miscellaneous Goal #2 - Progress: Goal set today  Visit Information  Last OT Received On: 12/25/11 Assistance Needed: +1    Subjective Data  Subjective: I think that walker was why I fell. Patient Stated Goal: To go back home and to  get her LUE back to working again.   Prior Functioning     Home Living Lives With: Alone Available Help at Discharge: Home health;Available PRN/intermittently (aide 7 days/wk, 4 hours/day) Type of Home: Apartment Home Access: Ramped entrance Home Layout: One level Bathroom Shower/Tub: Other (comment) (pt sponge bathed) Bathroom Toilet: Standard Home Adaptive Equipment: Bedside commode/3-in-1;Walker - rolling (reports bedside commode is too small for her) Prior Function Level of Independence: Needs assistance Needs Assistance: Bathing;Dressing;Light Housekeeping;Meal Prep Bath: Moderate Dressing: Moderate Meal Prep: Total Light Housekeeping: Total Driving: No Vocation: On disability Comments: pt uses medicaid transport to go to doctors appointments Communication Communication: No difficulties Dominant Hand: Right         Vision/Perception Vision - Assessment Vision Assessment: Vision not tested Perception Perception: Within Functional Limits Praxis Praxis: Intact   Cognition  Overall Cognitive Status: Appears within functional limits for tasks assessed/performed Arousal/Alertness: Awake/alert Orientation Level: Appears intact for tasks assessed Behavior During Session: [facility] for tasks performed Cognition - Other Comments: unwilling to consider SNF    Extremity/Trunk Assessment Right Upper Extremity Assessment RUE ROM/Strength/Tone: Within functional levels RUE Sensation: WFL - Light Touch RUE Coordination: WFL - gross/fine motor Left Upper Extremity Assessment LUE ROM/Strength/Tone: Deficits LUE ROM/Strength/Tone Deficits: Pt with full AROM shoulder flexion and elbow flexion.  Wrist flexion and extension limited to approximately 20% of normal range.  Only able to flex digits actively also only 20 % of normal ROM and can only tolerate approximately 50% of PROM to the MPs.   LUE Sensation:  (did not assess) LUE Coordination: Deficits LUE Coordination Deficits:  Pt  with only 20% of normal digit flexion. Right Lower Extremity Assessment RLE ROM/Strength/Tone: [facility] for tasks assessed Left Lower Extremity Assessment LLE ROM/Strength/Tone: [facility] for tasks assessed Trunk Assessment Trunk Assessment: Kyphotic     Mobility Bed Mobility Bed Mobility: Sit to Supine Supine to Sit: 5: Supervision Sitting - Scoot to Edge of Bed: 5: Supervision Sit to Supine: 4: Min assist Details for Bed Mobility Assistance: no physical assist, increased effort due to large body habitus Transfers Transfers: Sit to Stand Sit to Stand: 4: Min assist;With upper extremity assist;Other (comment) (RUE assist only) Details for Transfer Assistance: cues for safety and min tactile assist for stability; pt using bilateral upper extremities to reach for armrests she transfers to and pivots slowly with flexed trunk           Balance Balance Balance Assessed: Yes Static Standing Balance Static Standing - Balance Support: Right upper extremity supported Static Standing - Level of Assistance: 4: Min assist   End of Session OT - End of Session Activity Tolerance: Patient limited by fatigue Patient left: in bed;with call bell/phone within reach     [facility] OTR/L 12/25/2011, 1:39 PM

## 2011-12-25 NOTE — H&P (View-Only) (Signed)
Internal Medicine Teaching Service Attending Note Date: 12/25/2011  Patient name: [name]  Medical record number: [id number]  Date of birth: [birth year]    This patient has been seen and discussed with the house staff. Please see their note for complete details. I concur with their findings with the following additions/corrections: He is awaiting EGD at this time. Patient is currently hemodynamically stable. Patient has significant deconditioning which is currently being addressed with the assistance of physical therapy and occupational therapy. We will keep Lasix at current doses and continue to follow daily basic metabolic profile.  Lars Mage 12/25/2011, 2:56 PM

## 2011-12-25 NOTE — Progress Notes (Signed)
Pt was transferred to endoscopy by endoscopy personnel with RN.  After procedure pt will tx to room 4736

## 2011-12-25 NOTE — Progress Notes (Signed)
Subjective: Patient is hungry.  She has 4/10 periumbilical pain and sensation of nausea.  She has chest pain with coughing.  She has not tried cough medication.  She denies sob.     Objective: Vital signs in last 24 hours: Filed Vitals:   12/25/11 1630 12/25/11 1635 12/25/11 1640 12/25/11 1731  BP: 110/49 110/49 105/79 151/71  Pulse:    73  Temp:    98.4 F (36.9 C)  TempSrc:    Oral  Resp: 11 21 18 18   Height:    5\' 7"  (1.702 m)  Weight:    281 lb 8.4 oz (127.7 kg)  SpO2: 92% 92% 94% 92%   Weight change: -1 lb 8.7 oz (-0.7 kg)  Intake/Output Summary (Last 24 hours) at 12/25/11 1852 Last data filed at 12/25/11 1825  Gross per 24 hour  Intake 388.33 ml  Output    900 ml  Net -511.67 ml   General:lying in bed, nad, alert and oriented x 3  HEENT:[place]/at CV: RRR no rubs, murmurs or gallops Lungs: wheezes bilaterally but much improved  Abdomen: obese, soft, mild ttp periumbilically, nd, normal bowel sounds Extremities: warm, no cyanosis or edema Neuro: CN 2-12 grossly intact able to move all 4 extremities.  Lab Results: Basic Metabolic Panel:  Lab 12/25/11 1610 12/24/11 0120 12/23/11 0857  NA 141 140 --  K 3.7 3.0* --  CL 99 100 --  CO2 34* 32 --  GLUCOSE 123* 118* --  BUN 15 13 --  CREATININE 0.84 0.72 --  CALCIUM 8.5 8.3* --  MG -- -- 2.2  PHOS -- -- --   Liver Function Tests:  Lab 12/24/11 0120 12/23/11 0200  AST 9 16  ALT 5 6  ALKPHOS 91 99  BILITOT 0.2* 0.4  PROT 5.5* 6.0  ALBUMIN 2.7* 2.9*   CBC:  Lab 12/25/11 0901 12/25/11 0120 12/23/11 0200  WBC 9.4 11.5* --  NEUTROABS -- -- 9.5*  HGB 9.6* 9.2* --  HCT 31.9* 30.7* --  MCV 75.6* 74.9* --  PLT 279 269 --   Cardiac Enzymes:  Lab 12/23/11 2252 12/23/11 1451 12/23/11 0812  CKTOTAL -- -- --  CKMB -- -- --  CKMBINDEX -- -- --  TROPONINI <0.30 <0.30 <0.30   BNP:  Lab 12/23/11 0200  PROBNP 1346.0*   Hemoglobin A1C:  Lab 12/23/11 0200  HGBA1C 6.1*   Thyroid Function Tests:  Lab 12/23/11  0200  TSH 0.618  T4TOTAL --  FREET4 --  T3FREE --  THYROIDAB --   Coagulation:  Lab 12/25/11 0120 12/22/11 1700  LABPROT 16.5* 23.8*  INR 1.37 2.24*   Anemia Panel:  Lab 12/23/11 0200  VITAMINB12 455  FOLATE 9.0  FERRITIN 5*  TIBC 414  IRON 18*  RETICCTPCT 1.3   Urinalysis:  Lab 12/23/11 0348  COLORURINE YELLOW  LABSPEC 1.018  PHURINE 6.0  GLUCOSEU NEGATIVE  HGBUR NEGATIVE  BILIRUBINUR NEGATIVE  KETONESUR NEGATIVE  PROTEINUR NEGATIVE  UROBILINOGEN 1.0  NITRITE POSITIVE*  LEUKOCYTESUR MODERATE*   Misc. Labs: none   Micro Results: Recent Results (from the past 240 hour(s))  MRSA PCR SCREENING     Status: Abnormal   Collection Time   12/23/11 12:22 AM      Component Value Range Status Comment   MRSA by PCR POSITIVE (*) NEGATIVE Final   CULTURE, EXPECTORATED SPUTUM-ASSESSMENT     Status: Normal   Collection Time   12/23/11  3:31 AM      Component Value Range Status Comment  Specimen Description SPUTUM   Final    Special Requests NONE   Final    Sputum evaluation     Final    Value: THIS SPECIMEN IS ACCEPTABLE. RESPIRATORY CULTURE REPORT TO FOLLOW.   Report Status 12/23/2011 FINAL   Final   CULTURE, RESPIRATORY     Status: Normal   Collection Time   12/23/11  3:31 AM      Component Value Range Status Comment   Specimen Description SPUTUM   Final    Special Requests NONE   Final    Gram Stain     Final    Value: MODERATE WBC PRESENT,BOTH PMN AND MONONUCLEAR     MODERATE SQUAMOUS EPITHELIAL CELLS PRESENT     NO ORGANISMS SEEN   Culture NORMAL OROPHARYNGEAL FLORA   Final    Report Status 12/25/2011 FINAL   Final   URINE CULTURE     Status: Normal   Collection Time   12/23/11  3:48 AM      Component Value Range Status Comment   Specimen Description URINE, CLEAN CATCH   Final    Special Requests NONE   Final    Culture  Setup Time 12/23/2011 04:22   Final    Colony Count >=100,000 COLONIES/ML   Final    Culture ESCHERICHIA COLI   Final    Report Status  12/25/2011 FINAL   Final    Organism ID, Bacteria ESCHERICHIA COLI   Final    Studies/Results: No results found. Medications:  Scheduled Meds:    . albuterol  2.5 mg Nebulization TID  . cefTRIAXone (ROCEPHIN)  IV  2 g Intravenous QHS  . Chlorhexidine Gluconate Cloth  6 each Topical Q0600  . ferrous sulfate  325 mg Oral TID WC  . furosemide  40 mg Intravenous BID  . gabapentin  100 mg Oral TID  . ipratropium  0.5 mg Nebulization TID  . lamoTRIgine  200 mg Oral q morning - 10a  . mupirocin ointment  1 application Nasal BID  . pantoprazole (PROTONIX) IV  40 mg Intravenous Q12H  . [COMPLETED] potassium chloride SA  40 mEq Oral BID  . potassium chloride  40 mEq Oral TID  . QUEtiapine  400 mg Oral QHS  . sertraline  200 mg Oral Daily  . simvastatin  20 mg Oral q1800  . sodium chloride  3 mL Intravenous Q12H  . sodium chloride  3 mL Intravenous Q12H  . [DISCONTINUED] furosemide  20 mg Intravenous Once   Continuous Infusions:    . sodium chloride     PRN Meds:.sodium chloride, acetaminophen, acetaminophen, albuterol, dextromethorphan, HYDROcodone-acetaminophen, LORazepam, ondansetron (ZOFRAN) IV, ondansetron, sodium chloride, [DISCONTINUED] butamben-tetracaine-benzocaine, [DISCONTINUED] fentaNYL, [DISCONTINUED] midazolam  Assessment/Plan: 66 y.o PMH interstitial lung disease on 2 L home O2, tobacco abuse depression/anxiety, GERD, HTN, history of HLD, history of irritable bowel syndrome, spinal stenosis, pulmonary embolism/DVT on Coumadin prior to admission, history of OSA, s/p mastectomy for breast cancer, stroke, anemia.  She presents for weakness, shortness of breath and cough noted to have a hemoglobin of 4.8 on admission and a history of black bowel movements prior to admission.   1. 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 Elnoria Howard repeat EGD today.  The bleeding was stopped with the procedure.  -repeat EGD 1 month -pending CBC q  12 -goal transfuse if hemoglobin <7  -Protonix 40 mg iv q 12 hours  -prn Hydrocodone for abdominal pain after procedure  2. Acute CHF secondary to high output heart failure due to anemia -continue to diurese Lasix 40 mg iv bid  -fluid restricted diet  -strict i/o; out  1.3 L over 24 hours net negative 2.545 L, daily weights (down 3 lbs since admission 311-->308 lbs) -CE negative x 3  -consider repeat echo once acute exacerbation resolved   3. Chronic respiratory failure secondary to interstitial lung disease on home O2 and tobacco abuse -pfts do no indicate obstructive disease they indicate restrictive lung disease  -It does not appear currently that the patient has community acquired pneumonia.  She has cough with increased sputum intermittently which could be related to chronic smoking.  Dc'ed antibiotics -Wheezing is improved. Will continue Albuterol and Atrovent nebs tid, and Albuterol neb prn  -smoking cessation material will be given  4. History of HYPERTENSION but slightly hypotensive this admission -trending up 100s-150s/40s-80s -will monitor -no antihypertensives   5. History of thromboembolism previously on Coumadin -INR 2.24 on 12/2 now 1.37, holding Coumadin due to acute blood loss.   6. E.coli  UTI -with symptoms of dysuria -Continue Rocephin Day 3   7. MRSA + nares -protocol  8. DVT Px  -scds   9. F/E/N -will monitor electrolytes -fluid restricted heart diet 1 L   Dispo: Disposition is deferred at this time, awaiting improvement of current medical problems.  Anticipated discharge in approximately 1-2 day(s).   The patient does have a current PCP (KLIMA, LAWRENCE D, MD), therefore is requiring OPC follow-up after discharge.   The patient does have transportation limitations that hinder transportation to clinic  appointments.  .Services Needed at time of discharge: Y = Yes, Blank = No PT:   OT:   RN:   Equipment:   Other:     LOS: 3 days   [name] [phone] 12/25/2011, 6:52 PM

## 2011-12-25 NOTE — Progress Notes (Signed)
Pt has a pocket book and a bag of belongings including her home meds that she wanted locked up in security during the procedure.  Security was called and states that they are unable to house that many belongings.  Items will be taken to pts nurse on unit 4700 until.  Pt now will tx to room 4736 per the charge nurse on unit 4700.

## 2011-12-25 NOTE — Interval H&P Note (Signed)
History and Physical Interval Note:  12/25/2011 3:38 PM  [name]  has presented today for surgery, with the diagnosis of Anemia  The various methods of treatment have been discussed with the patient and family. After consideration of risks, benefits and other options for treatment, the patient has consented to  Procedure(s) (LRB) with comments: ESOPHAGOGASTRODUODENOSCOPY (EGD) (N/A) as a surgical intervention .  The patient's history has been reviewed, patient examined, no change in status, stable for surgery.  I have reviewed the patient's chart and labs.  Questions were answered to the patient's satisfaction.     Herberth Deharo D

## 2011-12-25 NOTE — Evaluation (Signed)
Physical Therapy Evaluation Patient Details Name: [name] MRN: [id number] DOB: May 12, 1945 Today's Date: 12/25/2011 Time: 1308-6578 PT Time Calculation (min): 33 min  PT Assessment / Plan / Recommendation Clinical Impression  66 y.o PMH interstitial lung disease on 2 L home O2, tobacco abuse depression/anxiety, GERD, HTN, history of HLD, history of irritable bowel syndrome, spinal stenosis, pulmonary embolism/DVT on Coumadin prior to admission, history of OSA, s/p mastectomy for breast cancer, stroke, anemia.  She presents for weakness, shortness of breath and cough noted to have a hemoglobin of 4.8 on admission and a history of black bowel movements prior to admission.  Presents to PT today with below impairments limiting mobility and independence. Will benefit physical therapy in the acute setting to maxmize function for safe d/c. At this time I would recommend SNF for d/c as safest plan but pt will likely refuse. She needs supervision for all transfers especially with her history of falls. Would she qualify for ALF?     PT Assessment  Patient needs continued PT services    Follow Up Recommendations  SNF (if pt refuses SNF she needs HHPT)    Does the patient have the potential to tolerate intense rehabilitation      Barriers to Discharge Decreased caregiver support      Equipment Recommendations   (bariatric 3in1)    Recommendations for Other Services     Frequency Min 3X/week    Precautions / Restrictions Precautions Precautions: Fall Restrictions Weight Bearing Restrictions: No Other Position/Activity Restrictions: pt reports old left wrist fracture and that she isn't supposed to be using this wrist but has been using it for transfers regardless   Pertinent Vitals/Pain Vitals stable, pt on 3 liters O2      Mobility  Bed Mobility Bed Mobility: Supine to Sit;Sitting - Scoot to Edge of Bed Supine to Sit: 5: Supervision Sitting - Scoot to Edge of Bed: 5:  Supervision Details for Bed Mobility Assistance: no physical assist, increased effort due to large body habitus Transfers Transfers: Stand Pivot Transfers (x2) Stand Pivot Transfers: 4: Min assist;With armrests Details for Transfer Assistance: cues for safety and min tactile assist for stability; pt using bilateral upper extremities to reach for armrests she transfers to and pivots slowly with flexed trunk Ambulation/Gait Ambulation/Gait Assistance: Not tested (comment) (pt reports she doesn't walk at home)              PT Diagnosis: Difficulty walking;Generalized weakness  PT Problem List: Decreased strength;Decreased activity tolerance;Decreased mobility;Obesity;Cardiopulmonary status limiting activity PT Treatment Interventions: DME instruction;Gait training;Functional mobility training;Therapeutic activities;Therapeutic exercise;Balance training;Patient/family education;Wheelchair mobility training;Neuromuscular re-education   PT Goals Acute Rehab PT Goals PT Goal Formulation: With patient Time For Goal Achievement: 01/01/12 Potential to Achieve Goals: Fair Pt will go Supine/Side to Sit: with modified independence PT Goal: Supine/Side to Sit - Progress: Goal set today Pt will go Sit to Supine/Side: with modified independence PT Goal: Sit to Supine/Side - Progress: Goal set today Pt will go Sit to Stand: with modified independence PT Goal: Sit to Stand - Progress: Goal set today Pt will go Stand to Sit: with modified independence PT Goal: Stand to Sit - Progress: Goal set today Pt will Transfer Bed to Chair/Chair to Bed: with modified independence PT Transfer Goal: Bed to Chair/Chair to Bed - Progress: Goal set today Pt will Ambulate: 1 - 15 feet;with supervision;with least restrictive assistive device PT Goal: Ambulate - Progress: Goal set today  Visit Information  Last PT Received On: 12/25/11 Assistance Needed: +  1 (+2 for anything other than transfers)    Subjective  Data  Subjective: I need power wheelchair so I can get outside and get some fresh air.  Patient Stated Goal: to get over the procedure this afternoon   Prior Functioning  Home Living Lives With: Alone Available Help at Discharge: Home health;Available PRN/intermittently (aide 7 days/wk, 4 hours/day) Type of Home: Apartment Home Access: Ramped entrance Home Layout: One level Bathroom Toilet: Standard Home Adaptive Equipment: Bedside commode/3-in-1;Walker - rolling (reports bedside commode is too small for her) Prior Function Level of Independence: Needs assistance Needs Assistance: Bathing;Dressing;Light Housekeeping;Meal Prep Bath: Moderate Dressing: Moderate Meal Prep: Total Light Housekeeping: Total Driving: No Vocation: On disability Comments: pt uses medicaid transport to go to doctors appointments Communication Communication: No difficulties    Cognition  Overall Cognitive Status: Appears within functional limits for tasks assessed/performed Arousal/Alertness: Awake/alert Orientation Level: Appears intact for tasks assessed Behavior During Session: [facility] for tasks performed Cognition - Other Comments: unwilling to consider SNF    Extremity/Trunk Assessment Right Upper Extremity Assessment RUE ROM/Strength/Tone: [facility] for tasks assessed Left Upper Extremity Assessment LUE ROM/Strength/Tone: [facility] for tasks assessed Right Lower Extremity Assessment RLE ROM/Strength/Tone: [facility] for tasks assessed Left Lower Extremity Assessment LLE ROM/Strength/Tone: [facility] for tasks assessed Trunk Assessment Trunk Assessment: Kyphotic   Balance    End of Session PT - End of Session Equipment Utilized During Treatment: Gait belt Activity Tolerance: Patient limited by fatigue Patient left: in chair;with call bell/phone within reach Nurse Communication: Mobility status;Other (comment) (pt requests bed changed and to get back to bed quickly)  GP     [facility] HELEN 12/25/2011, 11:49  AM

## 2011-12-25 NOTE — OR Nursing (Signed)
Prior to coming to endo, 3 rings removed and placed into change section of purse. CCU Rn to have security place purse in locker. Pt to go to 4736 after procedure/

## 2011-12-25 NOTE — Progress Notes (Signed)
Pt will transfer to room 4712

## 2011-12-25 NOTE — Progress Notes (Signed)
Internal Medicine Teaching Service Attending Note Date: 12/25/2011  Patient name: [name]  Medical record number: [id number]  Date of birth: 07/30/1945    This patient has been seen and discussed with the house staff. Please see their note for complete details. I concur with their findings with the following additions/corrections: He is awaiting EGD at this time. Patient is currently hemodynamically stable. Patient has significant deconditioning which is currently being addressed with the assistance of physical therapy and occupational therapy. We will keep Lasix at current doses and continue to follow daily basic metabolic profile.  Novella Abraha 12/25/2011, 2:56 PM  

## 2011-12-26 ENCOUNTER — Encounter (HOSPITAL_COMMUNITY): Payer: Self-pay | Admitting: Gastroenterology

## 2011-12-26 ENCOUNTER — Inpatient Hospital Stay (HOSPITAL_COMMUNITY): Payer: PRIVATE HEALTH INSURANCE

## 2011-12-26 DIAGNOSIS — I517 Cardiomegaly: Secondary | ICD-10-CM

## 2011-12-26 LAB — BASIC METABOLIC PANEL
BUN: 13 mg/dL (ref 6–23)
Chloride: 103 mEq/L (ref 96–112)
GFR calc Af Amer: >90 mL/min (ref >90)
GFR calc non Af Amer: 89 mL/min — ABNORMAL LOW (ref >90)
Potassium: 4.7 mEq/L (ref 3.5–5.1)
Sodium: 143 mEq/L (ref 135–145)

## 2011-12-26 LAB — CBC WITH DIFFERENTIAL/PLATELET
Basophils Relative: 0 % (ref 0–1)
Eosinophils Relative: 3 % (ref 0–5)
Hemoglobin: 10.3 g/dL — ABNORMAL LOW (ref 12.0–15.0)
MCH: 22.8 pg — ABNORMAL LOW (ref 26.0–34.0)
MCV: 77.4 fL — ABNORMAL LOW (ref 78.0–100.0)
Monocytes Absolute: 0.6 10*3/uL (ref 0.1–1.0)
Monocytes Relative: 6 % (ref 3–12)
Neutrophils Relative %: 78 % — ABNORMAL HIGH (ref 43–77)
Platelets: 337 10*3/uL (ref 150–400)
RBC: 4.52 MIL/uL (ref 3.87–5.11)
WBC: 9.6 10*3/uL (ref 4.0–10.5)

## 2011-12-26 LAB — PROTIME-INR: Prothrombin Time: 14.3 seconds (ref 11.6–15.2)

## 2011-12-26 MED ORDER — NICOTINE 14 MG/24HR TD PT24
14.0000 mg | MEDICATED_PATCH | Freq: Every day | TRANSDERMAL | Status: DC
  Administered 2011-12-26 – 2011-12-29 (×4): 14 mg via TRANSDERMAL
  Filled 2011-12-26 (×4): qty 1

## 2011-12-26 MED ORDER — WARFARIN SODIUM 2 MG PO TABS
2.0000 mg | ORAL_TABLET | Freq: Once | ORAL | Status: AC
  Administered 2011-12-26: 2 mg via ORAL
  Filled 2011-12-26: qty 1

## 2011-12-26 MED ORDER — WARFARIN - PHARMACIST DOSING INPATIENT: Freq: Every day | Status: DC

## 2011-12-26 NOTE — Progress Notes (Addendum)
Subjective: Patient states she is sleepy. 6/10 abdominal pain around belly button no pain meds yet.  Breathing and chest pain have improved.  She does not want to go to a SNF.  She wants a nicotine patch and smoking 4 cigarettes daily.   Objective: Vital signs in last 24 hours: Filed Vitals:   12/26/11 0538 12/26/11 0631 12/26/11 0845 12/26/11 0912  BP:    109/69  Pulse: 89 70  79  Temp:  98.2 F (36.8 C)  97.2 F (36.2 C)  TempSrc: Oral Oral  Oral  Resp: 18 18  18   Height:      Weight:  291 lb 3.6 oz (132.1 kg)    SpO2: 98% 92% 97% 92%   Weight change: -26 lb 14.3 oz (-12.2 kg)  Intake/Output Summary (Last 24 hours) at 12/26/11 1142 Last data filed at 12/26/11 1041  Gross per 24 hour  Intake 798.34 ml  Output   2775 ml  Net -1976.66 ml   General:lying in bed asleep but arousable, nad, alert and oriented x 3  HEENT:/at CV: RRR no rubs, murmurs or gallops Lungs: wheezes bilaterally  Abdomen: obese, soft, mod ttp periumbilically, mild ttp RUQ. LUQ, LLL, nd, normal bowel sounds Extremities: warm, no cyanosis or edema Neuro: CN 2-12 grossly intact able to move all 4 extremities.  Lab Results: Basic Metabolic Panel:  Lab 12/26/11 4098 12/25/11 2155 12/23/11 0857  NA 143 141 --  K 4.7 4.2 --  CL 103 99 --  CO2 32 33* --  GLUCOSE 103* 121* --  BUN 13 13 --  CREATININE 0.68 0.69 --  CALCIUM 9.0 9.1 --  MG -- -- 2.2  PHOS -- -- --    CBC:  Lab 12/26/11 0943 12/25/11 2155  WBC 9.6 11.5*  NEUTROABS 7.5 9.2*  HGB 10.3* 10.2*  HCT 35.0* 34.6*  MCV 77.4* 75.9*  PLT 337 329    Coagulation:  Lab 12/26/11 0943 12/25/11 0120 12/22/11 1700  LABPROT 14.3 16.5* 23.8*  INR 1.13 1.37 2.24*   Misc. Labs: none   Micro Results: Recent Results (from the past 240 hour(s))  MRSA PCR SCREENING     Status: Abnormal   Collection Time   12/23/11 12:22 AM      Component Value Range Status Comment   MRSA by PCR POSITIVE (*) NEGATIVE Final   CULTURE, EXPECTORATED  SPUTUM-ASSESSMENT     Status: Normal   Collection Time   12/23/11  3:31 AM      Component Value Range Status Comment   Specimen Description SPUTUM   Final    Special Requests NONE   Final    Sputum evaluation     Final    Value: THIS SPECIMEN IS ACCEPTABLE. RESPIRATORY CULTURE REPORT TO FOLLOW.   Report Status 12/23/2011 FINAL   Final   CULTURE, RESPIRATORY     Status: Normal   Collection Time   12/23/11  3:31 AM      Component Value Range Status Comment   Specimen Description SPUTUM   Final    Special Requests NONE   Final    Gram Stain     Final    Value: MODERATE WBC PRESENT,BOTH PMN AND MONONUCLEAR     MODERATE SQUAMOUS EPITHELIAL CELLS PRESENT     NO ORGANISMS SEEN   Culture NORMAL OROPHARYNGEAL FLORA   Final    Report Status 12/25/2011 FINAL   Final   URINE CULTURE     Status: Normal   Collection Time  12/23/11  3:48 AM      Component Value Range Status Comment   Specimen Description URINE, CLEAN CATCH   Final    Special Requests NONE   Final    Culture  Setup Time 12/23/2011 04:22   Final    Colony Count >=100,000 COLONIES/ML   Final    Culture ESCHERICHIA COLI   Final    Report Status 12/25/2011 FINAL   Final    Organism ID, Bacteria ESCHERICHIA COLI   Final    Studies/Results: Dg Chest 2 View  12/26/2011  *RADIOLOGY REPORT*  Clinical Data: Cough and shortness of breath.  CHEST - 2 VIEW  Comparison: Plain films of the chest 12/23/2011.  Findings: Diffuse bilateral airspace disease persists with some improvement in aeration in the right lower lobe.  Small bilateral pleural effusions are noted.  There is cardiomegaly.  IMPRESSION: Mild improvement in diffuse bilateral airspace disease like reflecting pulmonary edema.   Original Report Authenticated By: Holley Dexter, M.D.    Medications:  Scheduled Meds:    . albuterol  2.5 mg Nebulization TID  . Chlorhexidine Gluconate Cloth  6 each Topical Q0600  . ferrous sulfate  325 mg Oral TID WC  . furosemide  40 mg  Intravenous BID  . gabapentin  100 mg Oral TID  . ipratropium  0.5 mg Nebulization TID  . lamoTRIgine  200 mg Oral q morning - 10a  . mupirocin ointment  1 application Nasal BID  . nicotine  14 mg Transdermal Daily  . pantoprazole (PROTONIX) IV  40 mg Intravenous Q12H  . potassium chloride  40 mEq Oral TID  . QUEtiapine  400 mg Oral QHS  . sertraline  200 mg Oral Daily  . simvastatin  20 mg Oral q1800  . sodium chloride  3 mL Intravenous Q12H  . sodium chloride  3 mL Intravenous Q12H  . [DISCONTINUED] cefTRIAXone (ROCEPHIN)  IV  2 g Intravenous QHS  . [DISCONTINUED] furosemide  20 mg Intravenous Once   Continuous Infusions:    . sodium chloride 10 mL/hr (12/25/11 2123)   PRN Meds:.sodium chloride, acetaminophen, acetaminophen, albuterol, dextromethorphan, HYDROcodone-acetaminophen, LORazepam, ondansetron (ZOFRAN) IV, ondansetron, sodium chloride, [DISCONTINUED] butamben-tetracaine-benzocaine, [DISCONTINUED] fentaNYL, [DISCONTINUED] midazolam  Assessment/Plan: 66 y.o PMH interstitial lung disease on 2 L home O2, tobacco abuse depression/anxiety, GERD, HTN, history of HLD, history of irritable bowel syndrome, spinal stenosis, pulmonary embolism/DVT on Coumadin prior to admission, history of OSA, s/p mastectomy for breast cancer, stroke, anemia.  She presents for weakness, shortness of breath and cough noted to have a hemoglobin of 4.8 on admission and a history of black bowel movements prior to admission.    1. Anemia with acute blood loss and iron deficiency -Hemoglobin 4.8 on admission s/p transfusion of 7 units pRBCS with hbg now 10.3 -Etiology of blood loss was secondary to gastric antral vascular ectasia (GAVE) oozing blood noted by Dr. Elnoria Howard 12/5.  The bleeding was stopped with the procedure.  -repeat EGD 1 month -consider repeat CBC q 3 months due to the patient being on anticoagulation.  -Protonix 40 mg iv q 12 hours  -prn Hydrocodone for abdominal pain after procedure  2.  Acute CHF secondary to high output heart failure due to anemia -continue to diurese Lasix 40 mg iv bid  -fluid restricted diet  -strict i/o; out  1.1669 L over 24 hours net negative 3.622 L, daily weights (down 3 lbs since admission 311-->291 lbs) -repeat CXR today 2 view showed mild improvement in diffuse bilateral airspace  disease likely pulmonary edema -TTE ordered today   3. Chronic respiratory failure secondary to interstitial lung disease on home O2 and tobacco abuse -Wheezing. Will continue Albuterol and Atrovent nebs tid, and Albuterol neb prn  -smoking cessation material will be given  4. History of HYPERTENSION but slightly hypotensive this admission -trending up 98-120/60s-70s -will monitor -no antihypertensives   5. History of thromboembolism previously on Coumadin -INR 1.3 resumed Coumadin per Pharmacy -will monitor INR  6. E.coli  UTI -with symptoms of dysuria -will d/c Rocephin today 3 days treated   7. MRSA + nares -protocol  8. DVT Px  -Coumadin per pharmacy   9. F/E/N -will monitor electrolytes -fluid restricted heart diet 1 L   Dispo: [employer] will not cover inpatient rehab. Consider other dispo options patient refuses SNF  The patient does have a current PCP (KLIMA, LAWRENCE D, MD), therefore is requiring OPC follow-up after discharge.   The patient does have transportation limitations that hinder transportation to clinic appointments.  .Services Needed at time of discharge: Y = Yes, Blank = No PT:   OT:   RN:   Equipment:   Other:     LOS: 4 days   Annett Gula [phone] 12/26/2011, 11:42 AM

## 2011-12-26 NOTE — Progress Notes (Signed)
12/26/11 1619 PT recommended SNF.  Lupita Leash, CSW, went into to speak with pt. about possible SNF placement, and pt. refused.  She stated she has an home health aide that comes to help her 3hrs a day M-F, and 4hr on weekends.  Pt. stated that she has used [facility] in past and was satisfied with their services.  TC to Eunice Blase, with [facility] to give referral for [facility] PT/OT and [facility] RN.  Pt. may dc this weekend.  In addition, pt. states she has a walker, and 3-N-1, as well as home oxygen.  Pt. paid for her oxygen, and has not been qualified and ordered by physician. Physician, please place order in chart for DME oxygen so that pt can get o2 ordered.   Tera Mater, RN, BSN NCM [phone]

## 2011-12-26 NOTE — Progress Notes (Signed)
Occupational Therapy Treatment Patient Details Name: MISCHA BRITTINGHAM MRN: [id number] DOB: [birth year] Today's Date: 12/26/2011 Time: 4098-1191 OT Time Calculation (min): 36 min  OT Assessment / Plan / Recommendation Comments on Treatment Session Pt exhibits limitations in ADL's and self care secondary to reports that she is not supposed to use or WB through L wrist/hand (Pt states that she was to f/u w/ Dr. Orlan Leavens w/in 1wk from now).Pt would benefit from consult & clarification of LUE precautions and WB status as she is significantly limited for functional transfers, mobility & ADL's. OT/PT recommend SNF rehab, but pt currently declining. Pt has requested CIR consult, ?Screen/consult as well as f/u from MD re:LUE WB and precautions.     Follow Up Recommendations  SNF;Other (comment) (SNF vs 24hr supervision/assist)    Barriers to Discharge   Home health aide only 4hours/day, pt currently not ambulating due to decreased endurance & generalized weakness.    Equipment Recommendations  3 in 1 bedside comode;Other (comment) (bariatric 3:1)    Recommendations for Other Services Other (comment) (?CIR screening)  Frequency Min 2X/week   Plan Discharge plan needs to be updated    Precautions / Restrictions Precautions Precautions: Fall Precaution Comments: left wrist fracture in ?June and carpalectomy in July, unsure of precautions, pt reports she is not supposed to put weight on the wrist Restrictions Weight Bearing Restrictions: No Other Position/Activity Restrictions: pt reports old left wrist fracture and that she isn't supposed to be using this wrist but has been using it for transfers regardless    Pertinent Vitals/Pain Pt w/o c/o pain upon arrival to session, however pt does report L wrist pain (not rated) w/ mvt    ADL  Eating/Feeding: Simulated;Set up Where Assessed - Eating/Feeding: Chair Toilet Transfer: Minimal assistance;Other (comment);Simulated (Pt performed transfer to chair  Min-Mod A,bedpan prior) Toilet Transfer Method: Sit to stand;Stand pivot Toilet Transfer Equipment: Bedside commode Toileting - Clothing Manipulation and Hygiene: Other (comment);Minimal assistance;Performed;Moderate assistance (After bedpan use, pt rolled to R for hygiene) Where Assessed - Toileting Clothing Manipulation and Hygiene: Rolling right and/or left;Supine, head of bed flat Transfers/Ambulation Related to ADLs: Pt seen for therapeutic activity, ADL retraining, transfer training with goal of increasing endurance/activity participation in prep for increased participation in ADL's and self care tasks. Pt was Mod I bed mobility and sit @ EOB by rolling onto R side. Sit-stand Min-Mod A (HHA atpt L elbow & pt bearing wt down when standing & for SPT).. ADL Comments: Pt exhibits limitations in ADL's and self care secondary to reports that she is not supposed to use or WB through L wrist/hand (Pt states that she was to f/u w/ Dr. Orlan Leavens w/in 1wk from now).Pt would benefit from consult & clarification of LUE precautions and WB status as she is significantly limited for functional transfers, mobility & ADL's. Pt has requested CIR consult. OT/PT recommend SNF rehab, but pt currently declining. Pt has home health aide 4hr/day 7days/weekto assist w/ ADL/IADL's.     OT Diagnosis:    OT Problem List:   OT Treatment Interventions:     OT Goals ADL Goals ADL Goal: Toilet Transfer - Progress: Progressing toward goals ADL Goal: Toileting - Hygiene - Progress: Progressing toward goals  Visit Information  Last OT Received On: 12/26/11 Reason Eval/Treat Not Completed: Pain limiting ability to participate;Other (comment) (Pt states that she is not to use L handsecondary to wrist fx)    Subjective Data  Subjective: "I fell b/c of that walker" per pt when asked  about possbile platform walker use Patient Stated Goal: Return home vs CIR rehab   Prior Functioning       Cognition  Overall Cognitive Status:  Appears within functional limits for tasks assessed/performed Arousal/Alertness: Awake/alert Orientation Level: Appears intact for tasks assessed Behavior During Session: [facility] for tasks performed Cognition - Other Comments: Unwilling to consider SNF despite pt ed and need for increased assist w/ ADL's/Transfers and functional mobility    Mobility  Shoulder Instructions Bed Mobility Bed Mobility: Supine to Sit Supine to Sit: 5: Supervision;6: Modified independent (Device/Increase time) Sitting - Scoot to Edge of Bed: 5: Supervision Details for Bed Mobility Assistance: Increased time, no physical assist Transfers Transfers: Sit to Stand Sit to Stand: 4: Min assist;With upper extremity assist;Other (comment) (LUE at elbow level only, pt bearing weight into elbow Left)                  End of Session OT - End of Session Equipment Utilized During Treatment: Gait belt Activity Tolerance: Patient limited by fatigue Patient left: in chair;with call bell/phone within reach Nurse Communication: Mobility status;Other (comment) (Re: Note to MD for clarification LUE precautions/WB)  GO     Mariam Dollar Beth Dixon 12/26/2011, 1:15 PM

## 2011-12-26 NOTE — Progress Notes (Signed)
Physical Therapy Treatment Patient Details Name: [name] MRN: [id number] DOB: July 22, 1945 Today's Date: 12/26/2011 Time: 4098-1191 PT Time Calculation (min): 37 min  PT Assessment / Plan / Recommendation Comments on Treatment Session  [name] is very deconditioned with limited mobility of only transfer bed->chair today still needing minA for transfer especially when not using her LUE to steady herself (as per her knowledge she is not to be bearing weight through left wrist but this needs to be further clarified). Not safe to be transferring without assist at home and I question how she will be able to navigate her home without assist (only has aide 4 hours/day). Educated pt on our recommendation for SNF and concern for safety but pt currently refusing. Noted CIR not an option per rehab coordinator.     Follow Up Recommendations  SNF;Supervision/Assistance - 24 hour (HHPT when she refuses)     Does the patient have the potential to tolerate intense rehabilitation     Barriers to Discharge        Equipment Recommendations   (bariatric 3in1)    Recommendations for Other Services    Frequency Min 3X/week   Plan Discharge plan remains appropriate;Frequency remains appropriate    Precautions / Restrictions Precautions Precautions: Fall Precaution Comments: left wrist fracture in ?June and carpalectomy in July, unsure of precautions, pt reports she is not supposed to put weight on the wrist Restrictions Weight Bearing Restrictions: No Other Position/Activity Restrictions: pt reports old left wrist fracture and that she isn't supposed to be using this wrist but has been using it for transfers regardless    Pertinent Vitals/Pain Attempted transfer on RA but pt's sats declined to 85% needing O2 reapplied, once resting with O2 pt above 90% on 2 liters    Mobility  Bed Mobility Bed Mobility: Rolling Left;Rolling Right Rolling Right: 5: Supervision Rolling Left: 5:  Supervision Supine to Sit: 5: Supervision;6: Modified independent (Device/Increase time) Sitting - Scoot to Edge of Bed: 5: Supervision Details for Bed Mobility Assistance: increased time and sequencing cues to avoid using/putting weight into LUE; when rolling right pt actively pulling with LUE on rail despite cues Transfers Sit to Stand: 4: Min assist Stand Pivot Transfers: 1: +2 Total assist Stand Pivot Transfers: Patient Percentage: 80% Details for Transfer Assistance: facilitation for power up to stand, pt shaky and needing RUE support to stabilize in standing as well as LUE support (at elbow only) during transfer; pt fatigues quickly upon standing  Ambulation/Gait Ambulation/Gait Assistance Details: pt too afraid to try walking, kept saying "are you gonna be able to pick me up off the floor      PT Goals Acute Rehab PT Goals PT Goal: Supine/Side to Sit - Progress: Progressing toward goal PT Goal: Sit to Stand - Progress: Progressing toward goal PT Goal: Stand to Sit - Progress: Progressing toward goal PT Transfer Goal: Bed to Chair/Chair to Bed - Progress: Progressing toward goal PT Goal: Ambulate - Progress: Not progressing  Visit Information  Last PT Received On: 12/26/11 Assistance Needed: +1 (easier with 2 assist, pt very anxious)    Subjective Data  Subjective: They keep telling me I haven't been cooperative.    Cognition  Overall Cognitive Status: Appears within functional limits for tasks assessed/performed Arousal/Alertness: Awake/alert Orientation Level: Appears intact for tasks assessed Behavior During Session: [facility] for tasks performed Cognition - Other Comments: Unwilling to consider SNF despite pt ed and need for increased assist w/ ADL's/Transfers and functional mobility    Balance  End of Session PT - End of Session Equipment Utilized During Treatment: Gait belt Activity Tolerance: Patient limited by fatigue Patient left: in chair;with call bell/phone  within reach Nurse Communication: Mobility status   GP     [facility] HELEN 12/26/2011, 2:22 PM

## 2011-12-26 NOTE — Progress Notes (Signed)
  Echocardiogram 2D Echocardiogram has been performed.  Georgian Co 12/26/2011, 3:41 PM

## 2011-12-26 NOTE — Progress Notes (Signed)
Patient referred to CSW for short term SNF. Met with patient this afternoon regarding this and she stated "I've already told my doctor and other people- I'm NOT going to a nursing home."  CSW attempted to discuss PT's recommendation for short term rehab without any successful resolution. Patient relates that she has been in several nursing centers in the past- [facility] and recently at [facility] (d/c'd sometime in October). She does not feel that she benefited from the stay. Patient states that her doctor is sending her to the "4th floor" for rehab and then she will go home. However- CIR is unable to accept patient due to her insurance.  CSW requested that patient allow a SNF search but she adamantly refuses and states she will go home with Home Health. She has a personal care aid during the week for 3 hours a day and 4 hours a day on Saturday and Sundays.  She relates that she has been followed in the past by [facility] but they have since stopped visits. She would like to resume this service. Patient has a following platform walker and BSC at home.  Notified RNCM- Ivonne Andrew of this information. Patient was encouraged to notify staff immediately should she change her mind about short term SNF. She was appreciative but declined assistance.  No further CSW needs identified; CSW signing off. [name]. [place]  [phone]

## 2011-12-26 NOTE — Progress Notes (Signed)
Internal Medicine Teaching Service Attending Note Date: 12/26/2011  Patient name: [name]  Medical record number: [id number]  Date of birth: 01/05/1946    This patient has been seen and discussed with the house staff. Please see their note for complete details. I concur with their findings with the following additions/corrections: patient is currently medically ready to be discharged but she is severely deconditioned to a point where it is difficult for her to do ADL's. She needs 24 hour help as per PT/OT note. Patient is refusing SNF(recommened by PT/OT) and not a candidate for inpatient rehab(insurance). The next best option is to send her home with home health PT/OT. We will follow up in clinic and gastro will need to follow up in 1 month. We will continue anticoagulation at this time due to ongoing immobility and hence rick factors for recurrent DVT and PE.   Lars Mage 12/26/2011, 12:35 PM

## 2011-12-26 NOTE — Progress Notes (Signed)
Rehab Admissions Coordinator Note:  Patient was screened by Clois Dupes for appropriateness for an Inpatient Acute Rehab Consult. Called by Dr. Shirlee Latch to assess if inpt rehab is a d/c option for this pt. Patient has [employer] medicare insurance. They will not approve admission for this diagnosis. At this time, we are recommending Skilled Nursing Facility.  Clois Dupes, RN 12/26/2011, 11:51 AM  I can be reached at [phone].

## 2011-12-26 NOTE — Progress Notes (Signed)
ANTICOAGULATION CONSULT NOTE - Initial Consult  Pharmacy Consult for Coumadin Indication: History of DVT/PE  Allergies  Allergen Reactions  . Adhesive (Tape) Other (See Comments)    "I break out all over"; Can only use paper tape  . Chlorpromazine Hcl Nausea And Vomiting  . Codeine Other (See Comments)    "first dose I took, my aide found me the next day laying on the floor"  . Morphine Other (See Comments)    caused change in mental status, she does not want to try it again.  . Sulfonamide Derivatives Nausea And Vomiting    Patient Measurements: Height: 5\' 7"  (170.2 cm) Weight: 291 lb 3.6 oz (132.1 kg) (bed) IBW/kg (Calculated) : 61.6   Vital Signs: Temp: 97.2 F (36.2 C) (12/06 0912) Temp src: Oral (12/06 0912) BP: 109/69 mmHg (12/06 0912) Pulse Rate: 79  (12/06 0912)  Labs:  [name] 12/26/11 0943 12/26/11 0610 12/25/11 2155 12/25/11 0901 12/25/11 0120 12/23/11 2252 12/23/11 1451  HGB 10.3* -- 10.2* -- -- -- --  HCT 35.0* -- 34.6* 31.9* -- -- --  PLT 337 -- 329 279 -- -- --  APTT -- -- -- -- -- -- --  LABPROT 14.3 -- -- -- 16.5* -- --  INR 1.13 -- -- -- 1.37 -- --  HEPARINUNFRC -- -- -- -- -- -- --  CREATININE -- 0.68 0.69 -- 0.84 -- --  CKTOTAL -- -- -- -- -- -- --  CKMB -- -- -- -- -- -- --  TROPONINI -- -- -- -- -- <0.30 <0.30    Estimated Creatinine Clearance: 96.7 ml/min (by C-G formula based on Cr of 0.68).   Medical History: Past Medical History  Diagnosis Date  . COPD (chronic obstructive pulmonary disease)   . Depression     followed by Dr. Gwyndolyn Kaufman, [facility]  . GERD (gastroesophageal reflux disease)   . Hypertension   . Hyperlipidemia   . Interstitial cystitis     followed by Dr. Isabel Caprice  . Irritable bowel syndrome     colonoscopy 12/2003 and EGD 1997 WNL, followed by Dr. Marina Goodell  . Spinal stenosis     L3-L4, L4-L5 per MRI 12/2004, status post lumbar decompression 02/2007 by Dr. Gerlene Fee  . Pulmonary embolism 10/2007    on coumadin and followed by Dr.  Alexandria Lodge  . OSA on CPAP     noncompliance  . S/P mastectomy 1992    bilateral. status post reconstruction in 1994 by Dr. Benna Dunks  . S/P rhinoplasty 1993  . PONV (postoperative nausea and vomiting)   . Pneumonia   . Exertional dyspnea   . Basal cell carcinoma 09/2001    followed by Dr. Karlyn Agee [facility]  . Breast cancer     bilaterally  . Headache     "use to have them alot" (07/24/11)  . Arthritis     "everywhere"  . Chronic lower back pain   . Anxiety   . Anginal pain   . Stroke   . Anemia   . Fibromyalgia     Assessment: 66yoF admitted with severe anemia (Hgb 4.8 on admit) due to acute blood loss from gastric antral vascular extasia (noted by Dr. Elnoria Howard 12/5) with bleeding stopped during procedure. Hgb today 10.3 s/p 7 units of PRBC. Pharmacy consulted to restart/manage coumadin for history of multiple DVT's. Discussed with Internal Medicine resident regarding Dr. Haywood Pao recommendation to avoid anticoagulation if possible. Per MD, benefits of coumadin outweigh risks as patient is high risk for clot -- so would  like to proceed with coumadin.  INR today is 1.13  after holding coumadin (x 3 doses) since admission (INR 2.24 on admit). Patient reports home dose prior to admission was 2mg  every other day alternating with 4mg  tablet.  Goal of Therapy:  INR 2-3 Monitor platelets by anticoagulation protocol: Yes   Plan:  1. Coumadin 2mg  x 1 today 2. F/u INR daily, CBC, s/sx bleeding  Benjaman Pott, PharmD    12/26/2011   12:25 PM

## 2011-12-26 NOTE — Progress Notes (Signed)
Subjective: No acute events.  HGB is stable, but she still does not feel well.  Problems with breathing.  Objective: Vital signs in last 24 hours: Temp:  [97.2 F (36.2 C)-99.3 F (37.4 C)] 97.2 F (36.2 C) (12/06 0912) Pulse Rate:  [70-89] 79  (12/06 0912) Resp:  [11-26] 18  (12/06 0912) BP: (83-151)/(45-87) 109/69 mmHg (12/06 0912) SpO2:  [87 %-99 %] 92 % (12/06 1200) Weight:  [127.7 kg (281 lb 8.4 oz)-132.1 kg (291 lb 3.6 oz)] 132.1 kg (291 lb 3.6 oz) (12/06 0631) Last BM Date: 12/23/11  Intake/Output from previous day: 12/05 0701 - 12/06 0700 In: 605.3 [P.O.:468; I.V.:87.3; IV Piggyback:50] Out: 2275 [Urine:2275] Intake/Output this shift: Total I/O In: 243 [P.O.:240; I.V.:3] Out: 800 [Urine:800]  General appearance: alert and no distress GI: soft, non-tender; bowel sounds normal; no masses,  no organomegaly  Lab Results:  [facility] 12/26/11 0943 12/25/11 2155 12/25/11 0901  WBC 9.6 11.5* 9.4  HGB 10.3* 10.2* 9.6*  HCT 35.0* 34.6* 31.9*  PLT 337 329 279   BMET  Basename 12/26/11 0610 12/25/11 2155 12/25/11 0120  NA 143 141 141  K 4.7 4.2 3.7  CL 103 99 99  CO2 32 33* 34*  GLUCOSE 103* 121* 123*  BUN 13 13 15   CREATININE 0.68 0.69 0.84  CALCIUM 9.0 9.1 8.5   LFT  Basename 12/24/11 0120  PROT 5.5*  ALBUMIN 2.7*  AST 9  ALT 5  ALKPHOS 91  BILITOT 0.2*  BILIDIR --  IBILI --   PT/INR  Basename 12/26/11 0943 12/25/11 0120  LABPROT 14.3 16.5*  INR 1.13 1.37   Hepatitis Panel No results found for this basename: HEPBSAG,HCVAB,HEPAIGM,HEPBIGM in the last 72 hours C-Diff No results found for this basename: CDIFFTOX:3 in the last 72 hours Fecal Lactopherrin No results found for this basename: FECLLACTOFRN in the last 72 hours  Studies/Results: Dg Chest 2 View  12/26/2011  *RADIOLOGY REPORT*  Clinical Data: Cough and shortness of breath.  CHEST - 2 VIEW  Comparison: Plain films of the chest 12/23/2011.  Findings: Diffuse bilateral airspace disease  persists with some improvement in aeration in the right lower lobe.  Small bilateral pleural effusions are noted.  There is cardiomegaly.  IMPRESSION: Mild improvement in diffuse bilateral airspace disease like reflecting pulmonary edema.   Original Report Authenticated By: Holley Dexter, M.D.     Medications:  Scheduled:   . albuterol  2.5 mg Nebulization TID  . Chlorhexidine Gluconate Cloth  6 each Topical Q0600  . ferrous sulfate  325 mg Oral TID WC  . furosemide  40 mg Intravenous BID  . gabapentin  100 mg Oral TID  . ipratropium  0.5 mg Nebulization TID  . lamoTRIgine  200 mg Oral q morning - 10a  . mupirocin ointment  1 application Nasal BID  . nicotine  14 mg Transdermal Daily  . pantoprazole (PROTONIX) IV  40 mg Intravenous Q12H  . potassium chloride  40 mEq Oral TID  . QUEtiapine  400 mg Oral QHS  . sertraline  200 mg Oral Daily  . simvastatin  20 mg Oral q1800  . sodium chloride  3 mL Intravenous Q12H  . sodium chloride  3 mL Intravenous Q12H  . warfarin  2 mg Oral ONCE-1800  . Warfarin - Pharmacist Dosing Inpatient   Does not apply q1800  . [DISCONTINUED] cefTRIAXone (ROCEPHIN)  IV  2 g Intravenous QHS  . [DISCONTINUED] furosemide  20 mg Intravenous Once   Continuous:   . sodium  chloride 10 mL/hr (12/25/11 2123)    Assessment/Plan: 1) GAVE. 2) Anemia - Stable.   She is s/p APC and it appears that it was successful in the short term.  She will require a repeat EGD in 1 month.  I will make the arrangements.  Plan: 1) Repeat EGD in 1 month. 2) Follow HGB and transfuse as necessary. 3) Hold on anticoagulation if possible.  LOS: 4 days   Jaedynn Bohlken D 12/26/2011, 1:19 PM

## 2011-12-27 LAB — BASIC METABOLIC PANEL
BUN: 15 mg/dL (ref 6–23)
Chloride: 101 mEq/L (ref 96–112)
Creatinine, Ser: 0.79 mg/dL (ref 0.50–1.10)
GFR calc Af Amer: >90 mL/min (ref >90)
GFR calc non Af Amer: 84 mL/min — ABNORMAL LOW (ref >90)
Glucose, Bld: 115 mg/dL — ABNORMAL HIGH (ref 70–99)

## 2011-12-27 LAB — CBC
HCT: 33.8 % — ABNORMAL LOW (ref 36.0–46.0)
MCH: 22.6 pg — ABNORMAL LOW (ref 26.0–34.0)
MCHC: 29 g/dL — ABNORMAL LOW (ref 30.0–36.0)
RDW: 25.7 % — ABNORMAL HIGH (ref 11.5–15.5)

## 2011-12-27 MED ORDER — ENOXAPARIN SODIUM 40 MG/0.4ML ~~LOC~~ SOLN
40.0000 mg | SUBCUTANEOUS | Status: DC
  Administered 2011-12-27 – 2011-12-29 (×3): 40 mg via SUBCUTANEOUS
  Filled 2011-12-27 (×3): qty 0.4

## 2011-12-27 MED ORDER — PANTOPRAZOLE SODIUM 40 MG PO TBEC
40.0000 mg | DELAYED_RELEASE_TABLET | Freq: Two times a day (BID) | ORAL | Status: DC
  Administered 2011-12-27: 40 mg via ORAL

## 2011-12-27 MED ORDER — WARFARIN SODIUM 4 MG PO TABS
4.0000 mg | ORAL_TABLET | Freq: Once | ORAL | Status: AC
  Administered 2011-12-27: 4 mg via ORAL
  Filled 2011-12-27: qty 1

## 2011-12-27 MED ORDER — PANTOPRAZOLE SODIUM 40 MG PO TBEC
40.0000 mg | DELAYED_RELEASE_TABLET | Freq: Every day | ORAL | Status: DC
  Administered 2011-12-27 – 2011-12-29 (×3): 40 mg via ORAL
  Filled 2011-12-27: qty 1

## 2011-12-27 NOTE — Progress Notes (Signed)
I cosign for [name]'s assessment, med administration, notes, I/O, and care plan/education. 

## 2011-12-27 NOTE — Progress Notes (Signed)
Subjective: Patient reports that she continues to feel worn out, but adamant on going home at discharge. Breathing and CP continue to improve, though she complains of persistent chest and abdominal pain. Objective: Vital signs in last 24 hours: Filed Vitals:   12/26/11 2147 12/27/11 0621 12/27/11 0816 12/27/11 0847  BP: 134/62 125/55 99/64   Pulse: 85 79 80   Temp: 99.4 F (37.4 C) 98.4 F (36.9 C) 98.4 F (36.9 C)   TempSrc: Oral Oral Oral   Resp: 20 18 20    Height:      Weight:  290 lb 14.4 oz (131.951 kg)    SpO2: 97% 98% 31% 91%   Weight change: 9 lb 6 oz (4.251 kg)  Intake/Output Summary (Last 24 hours) at 12/27/11 1314 Last data filed at 12/27/11 1303  Gross per 24 hour  Intake 535.33 ml  Output   1975 ml  Net -1439.67 ml   General: resting in bed, no acute distress HEENT: PERRL, EOMI, no scleral icterus Cardiac: RRR, no rubs, murmurs or gallops, chest wall tender to palpation  Pulm: coarse expiratory breath sounds b/l Abd: soft, nontender, nondistended, BS normoactive, minimal tenderness to palpation of b/l lower qudarants Ext: warm and well perfused, no pedal edema Neuro: alert and oriented X3  Lab Results: Basic Metabolic Panel:  Lab 12/27/11 4098 12/26/11 0610 12/23/11 0857  NA 138 143 --  K 4.9 4.7 --  CL 101 103 --  CO2 29 32 --  GLUCOSE 115* 103* --  BUN 15 13 --  CREATININE 0.79 0.68 --  CALCIUM 9.0 9.0 --  MG -- -- 2.2  PHOS -- -- --   Liver Function Tests:  Lab 12/24/11 0120 12/23/11 0200  AST 9 16  ALT 5 6  ALKPHOS 91 99  BILITOT 0.2* 0.4  PROT 5.5* 6.0  ALBUMIN 2.7* 2.9*   CBC:  Lab 12/27/11 0636 12/26/11 0943 12/25/11 2155  WBC 10.9* 9.6 --  NEUTROABS -- 7.5 9.2*  HGB 9.8* 10.3* --  HCT 33.8* 35.0* --  MCV 77.9* 77.4* --  PLT 301 337 --   Cardiac Enzymes:  Lab 12/23/11 2252 12/23/11 1451 12/23/11 0812  CKTOTAL -- -- --  CKMB -- -- --  CKMBINDEX -- -- --  TROPONINI <0.30 <0.30 <0.30   BNP:  Lab 12/23/11 0200  PROBNP  1346.0*   Hemoglobin A1C:  Lab 12/23/11 0200  HGBA1C 6.1*   Thyroid Function Tests:  Lab 12/23/11 0200  TSH 0.618  T4TOTAL --  FREET4 --  T3FREE --  THYROIDAB --   Coagulation:  Lab 12/26/11 0943 12/25/11 0120 12/22/11 1700  LABPROT 14.3 16.5* 23.8*  INR 1.13 1.37 2.24*   Anemia Panel:  Lab 12/23/11 0200  VITAMINB12 455  FOLATE 9.0  FERRITIN 5*  TIBC 414  IRON 18*  RETICCTPCT 1.3   Urinalysis:  Lab 12/23/11 0348  COLORURINE YELLOW  LABSPEC 1.018  PHURINE 6.0  GLUCOSEU NEGATIVE  HGBUR NEGATIVE  BILIRUBINUR NEGATIVE  KETONESUR NEGATIVE  PROTEINUR NEGATIVE  UROBILINOGEN 1.0  NITRITE POSITIVE*  LEUKOCYTESUR MODERATE*    Micro Results: Recent Results (from the past 240 hour(s))  MRSA PCR SCREENING     Status: Abnormal   Collection Time   12/23/11 12:22 AM      Component Value Range Status Comment   MRSA by PCR POSITIVE (*) NEGATIVE Final   CULTURE, EXPECTORATED SPUTUM-ASSESSMENT     Status: Normal   Collection Time   12/23/11  3:31 AM      Component  Value Range Status Comment   Specimen Description SPUTUM   Final    Special Requests NONE   Final    Sputum evaluation     Final    Value: THIS SPECIMEN IS ACCEPTABLE. RESPIRATORY CULTURE REPORT TO FOLLOW.   Report Status 12/23/2011 FINAL   Final   CULTURE, RESPIRATORY     Status: Normal   Collection Time   12/23/11  3:31 AM      Component Value Range Status Comment   Specimen Description SPUTUM   Final    Special Requests NONE   Final    Gram Stain     Final    Value: MODERATE WBC PRESENT,BOTH PMN AND MONONUCLEAR     MODERATE SQUAMOUS EPITHELIAL CELLS PRESENT     NO ORGANISMS SEEN   Culture NORMAL OROPHARYNGEAL FLORA   Final    Report Status 12/25/2011 FINAL   Final   URINE CULTURE     Status: Normal   Collection Time   12/23/11  3:48 AM      Component Value Range Status Comment   Specimen Description URINE, CLEAN CATCH   Final    Special Requests NONE   Final    Culture  Setup Time 12/23/2011  04:22   Final    Colony Count >=100,000 COLONIES/ML   Final    Culture ESCHERICHIA COLI   Final    Report Status 12/25/2011 FINAL   Final    Organism ID, Bacteria ESCHERICHIA COLI   Final    Studies/Results: Dg Chest 2 View  12/26/2011  *RADIOLOGY REPORT*  Clinical Data: Cough and shortness of breath.  CHEST - 2 VIEW  Comparison: Plain films of the chest 12/23/2011.  Findings: Diffuse bilateral airspace disease persists with some improvement in aeration in the right lower lobe.  Small bilateral pleural effusions are noted.  There is cardiomegaly.  IMPRESSION: Mild improvement in diffuse bilateral airspace disease like reflecting pulmonary edema.   Original Report Authenticated By: Holley Dexter, M.D.    Medications: I have reviewed the patient's current medications. Scheduled Meds:   . albuterol  2.5 mg Nebulization TID  . Chlorhexidine Gluconate Cloth  6 each Topical Q0600  . ferrous sulfate  325 mg Oral TID WC  . furosemide  40 mg Intravenous BID  . gabapentin  100 mg Oral TID  . ipratropium  0.5 mg Nebulization TID  . lamoTRIgine  200 mg Oral q morning - 10a  . mupirocin ointment  1 application Nasal BID  . nicotine  14 mg Transdermal Daily  . pantoprazole  40 mg Oral Q12H  . potassium chloride  40 mEq Oral TID  . QUEtiapine  400 mg Oral QHS  . sertraline  200 mg Oral Daily  . simvastatin  20 mg Oral q1800  . sodium chloride  3 mL Intravenous Q12H  . sodium chloride  3 mL Intravenous Q12H  . [COMPLETED] warfarin  2 mg Oral ONCE-1800  . warfarin  4 mg Oral ONCE-1800  . Warfarin - Pharmacist Dosing Inpatient   Does not apply q1800  . [DISCONTINUED] pantoprazole (PROTONIX) IV  40 mg Intravenous Q12H   Continuous Infusions:   . sodium chloride 10 mL/hr (12/25/11 2123)   PRN Meds:.sodium chloride, acetaminophen, acetaminophen, albuterol, dextromethorphan, HYDROcodone-acetaminophen, LORazepam, ondansetron (ZOFRAN) IV, ondansetron, sodium chloride Assessment/Plan: 66 y.o PMH  interstitial lung disease on 2 L home O2, tobacco abuse depression/anxiety, GERD, HTN, history of HLD, history of irritable bowel syndrome, spinal stenosis, pulmonary embolism/DVT on Coumadin prior to admission,  history of OSA, s/p mastectomy for breast cancer, stroke, anemia. She presents for weakness, shortness of breath and cough noted to have a hemoglobin of 4.8 on admission and a history of black bowel movements prior to admission.   1. Anemia with acute blood loss and iron deficiency -Hemoglobin 4.8 on admission s/p transfusion of 7 units pRBCS with hbg now 9.8 (from 10.3)  -Etiology of blood loss was secondary to gastric antral vascular ectasia (GAVE) oozing blood noted by Dr. Elnoria Howard 12/5. The bleeding was stopped with the procedure.  -repeat EGD 1 month  -consider repeat CBC q 3 months due to the patient being on anticoagulation.  -d/c protonix bid and give protonix 40mg  daily (at home she takes omeprazole 20mg  daily, but this is not on formulary) -d/c norco, as pain likely MSK in origin and will likely respond to acetaminophen only  2. Acute CHF secondary to high output heart failure due to anemia  -Echo reveals EV 55-60% without comment regarding diastolic heart function; heart function improved after correction of anemia -lung exam still sounds course, so will continue to diurese Lasix 40 mg iv bid and monitor renal function -fluid restricted diet  -strict i/o; net negative 616 in last 24h and -5.4L since admission, daily weights (down 21 lbs since admission 311-->290 lbs)   3. Chronic respiratory failure secondary to interstitial lung disease on home O2 (2L) and tobacco abuse  -Wheezing. Will continue Albuterol and Atrovent nebs tid, and Albuterol neb prn  -smoking cessation material will be given at discharge - patient motivated to quit smoking -apparently patient has been paying for home O2 - will check O2 on RA and then place order for home DME  4. History of HYPERTENSION but  slightly hypotensive this admission -BP seems to be stable and under control; she is tolerating aggressive diuresis  -will monitor  -no antihypertensives at home  5. History of thromboembolism previously on Coumadin  -INR 1.13 resumed Coumadin per Pharmacy yesterday (12/6) -will monitor INR   6. Symptomatic E.coli UTI : treated - completed 3 day course of rocephin during hospitalization  7. MRSA + nares  -protocol   8. DVT Px  -Coumadin per pharmacy  -[place] lovenox 40mg  until therapuetic  9. F/E/N  -will monitor electrolytes  -fluid restricted heart diet 1 L   Dispo: [employer] will not cover inpatient rehab. Consider other dispo options patient refuses SNF - d/c in 1-2d The patient does have a current PCP (KLIMA, Smith Mince, MD), therefore is requiring OPC follow-up after discharge.  The patient does have transportation limitations that hinder transportation to clinic appointments.   .Services Needed at time of discharge: Y = Yes, Blank = No PT:   OT:   RN:   Equipment:   Other:     LOS: 5 days   KAPADIA, Nury Nebergall 12/27/2011, 1:14 PM

## 2011-12-27 NOTE — Progress Notes (Signed)
ANTICOAGULATION CONSULT NOTE - follow up  Pharmacy Consult for Coumadin Indication: History of DVT/PE  Allergies  Allergen Reactions  . Adhesive (Tape) Other (See Comments)    "I break out all over"; Can only use paper tape  . Chlorpromazine Hcl Nausea And Vomiting  . Codeine Other (See Comments)    "first dose I took, my aide found me the next day laying on the floor"  . Morphine Other (See Comments)    caused change in mental status, she does not want to try it again.  . Sulfonamide Derivatives Nausea And Vomiting    Patient Measurements: Height: 5\' 7"  (170.2 cm) Weight: 290 lb 14.4 oz (131.951 kg) (bed scale) IBW/kg (Calculated) : 61.6   Vital Signs: Temp: 98.4 F (36.9 C) (12/07 0816) Temp src: Oral (12/07 0816) BP: 99/64 mmHg (12/07 0816) Pulse Rate: 80  (12/07 0816)  Labs:  Alvira Philips 12/27/11 0636 12/26/11 0943 12/26/11 0610 12/25/11 2155 12/25/11 0120  HGB 9.8* 10.3* -- -- --  HCT 33.8* 35.0* -- 34.6* --  PLT 301 337 -- 329 --  APTT -- -- -- -- --  LABPROT -- 14.3 -- -- 16.5*  INR -- 1.13 -- -- 1.37  HEPARINUNFRC -- -- -- -- --  CREATININE 0.79 -- 0.68 0.69 --  CKTOTAL -- -- -- -- --  CKMB -- -- -- -- --  TROPONINI -- -- -- -- --    Estimated Creatinine Clearance: 96.7 ml/min (by C-G formula based on Cr of 0.79).  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 Elnoria Howard 12/5) with bleeding stopped during EGD Procedure with APC on 12/25/11.   Hgb today 9.8 s/p 7 units of PRBC. Pharmacy consulted to restart/manage coumadin for history of multiple DVT's on Friday 12/26/11. Discussed with Internal Medicine resident regarding Dr. Haywood Pao recommendation to avoid anticoagulation if possible. Per MD, benefits of coumadin outweigh risks as patient is high risk for clot -- so would like to proceed with coumadin.  No INR ordered for today. Yesterday's INR was 1.13  after holding coumadin  (x 3 doses) since admission (INR 2.24 on admit). Patient reports home dose prior to admission was 2mg  every other day alternating with 4mg  tablet.  Goal of Therapy:  INR 2-3 Monitor platelets by anticoagulation protocol: Yes   Plan:  1. Coumadin 4mg  x 1 today 2. F/u INR daily, CBC, s/sx bleeding 3. Change IV PPI to PO, IV site out this morning and pt medically ready for DC home  Herby Abraham, Pharm.D. [phone] 12/27/2011 11:09 AM

## 2011-12-27 NOTE — Progress Notes (Signed)
Pt c/o IV site pain. Painful to touch. No increase warmth to area noted.Disconnected fluids. Informed RN and place call to IV team. No sites seen to attempt restart.

## 2011-12-28 DIAGNOSIS — E875 Hyperkalemia: Secondary | ICD-10-CM | POA: Diagnosis not present

## 2011-12-28 LAB — CBC
Hemoglobin: 9.7 g/dL — ABNORMAL LOW (ref 12.0–15.0)
MCH: 22.5 pg — ABNORMAL LOW (ref 26.0–34.0)
MCHC: 28.5 g/dL — ABNORMAL LOW (ref 30.0–36.0)
Platelets: 343 10*3/uL (ref 150–400)
RDW: 26.3 % — ABNORMAL HIGH (ref 11.5–15.5)

## 2011-12-28 LAB — BASIC METABOLIC PANEL
BUN: 16 mg/dL (ref 6–23)
Calcium: 9.2 mg/dL (ref 8.4–10.5)
Creatinine, Ser: 0.76 mg/dL (ref 0.50–1.10)
GFR calc Af Amer: >90 mL/min (ref >90)
GFR calc non Af Amer: 85 mL/min — ABNORMAL LOW (ref >90)
Potassium: 5.3 mEq/L — ABNORMAL HIGH (ref 3.5–5.1)

## 2011-12-28 LAB — PROTIME-INR
INR: 1.25 (ref 0.00–1.49)
Prothrombin Time: 15.5 seconds — ABNORMAL HIGH (ref 11.6–15.2)

## 2011-12-28 MED ORDER — SODIUM POLYSTYRENE SULFONATE 15 GM/60ML PO SUSP
30.0000 g | Freq: Once | ORAL | Status: DC
  Administered 2011-12-28: 30 g via ORAL
  Filled 2011-12-28: qty 120

## 2011-12-28 MED ORDER — WARFARIN SODIUM 4 MG PO TABS
4.0000 mg | ORAL_TABLET | Freq: Once | ORAL | Status: AC
  Administered 2011-12-28: 4 mg via ORAL
  Filled 2011-12-28: qty 1

## 2011-12-28 NOTE — Progress Notes (Signed)
ANTICOAGULATION CONSULT NOTE - follow up  Pharmacy Consult for Coumadin Indication: History of DVT/PE  Allergies  Allergen Reactions  . Adhesive (Tape) Other (See Comments)    "I break out all over"; Can only use paper tape  . Chlorpromazine Hcl Nausea And Vomiting  . Codeine Other (See Comments)    "first dose I took, my aide found me the next day laying on the floor"  . Morphine Other (See Comments)    caused change in mental status, she does not want to try it again.  . Sulfonamide Derivatives Nausea And Vomiting    Patient Measurements: Height: 5\' 7"  (170.2 cm) Weight: 290 lb 12.6 oz (131.9 kg) (bed) IBW/kg (Calculated) : 61.6   Vital Signs: Temp: 97.5 F (36.4 C) (12/08 0541) Temp src: Oral (12/08 0541) BP: 137/73 mmHg (12/08 0541) Pulse Rate: 82  (12/08 0541)  Labs:  Basename 12/28/11 0600 12/27/11 0636 12/26/11 0943 12/26/11 0610  HGB 9.7* 9.8* -- --  HCT 34.0* 33.8* 35.0* --  PLT 343 301 337 --  APTT -- -- -- --  LABPROT 15.5* -- 14.3 --  INR 1.25 -- 1.13 --  HEPARINUNFRC -- -- -- --  CREATININE 0.76 0.79 -- 0.68  CKTOTAL -- -- -- --  CKMB -- -- -- --  TROPONINI -- -- -- --    Estimated Creatinine Clearance: 96.6 ml/min (by C-G formula based on Cr of 0.76).  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 Elnoria Howard 12/5) with bleeding stopped during EGD Procedure with APC on 12/25/11.   Hgb today 9.7 s/p 7 units of PRBC. Pharmacy consulted to restart/manage coumadin for history of multiple DVT's on Friday 12/26/11. Discussed with Internal Medicine resident regarding Dr. Haywood Pao recommendation to avoid anticoagulation if possible. Per MD, benefits of coumadin outweigh risks as patient is high risk for clot -- so would like to proceed with coumadin.  INR 2.24 on admit. Patient reports home dose prior to admission was 2mg  every other day alternating with 4mg  tablet. Today INR is  1.25 after 2 mg 12/6 and 4 mg 12/7.  LMWH 40 added to cover until INR tx. No bleeding reported.    Goal of Therapy:  INR 2-3 Monitor platelets by anticoagulation protocol: Yes   Plan:  1. repeat Coumadin 4mg  x 1 today 2. F/u INR daily, CBC, s/sx bleeding 3. LMWH 40 qday until INR tx  Herby Abraham, Pharm.D. [phone] 12/28/2011 11:45 AM

## 2011-12-28 NOTE — Progress Notes (Addendum)
Subjective: Ab pain is 8/10.  Sob is improved. Patient states neb treatments are helping. She is agreeable to get out of bed today and move.  She asks what she has to do to go home.    Objective: Vital signs in last 24 hours: Filed Vitals:   12/27/11 1338 12/27/11 2000 12/27/11 2016 12/28/11 0541  BP: 119/79  119/69 137/73  Pulse: 75  79 82  Temp: 98.5 F (36.9 C)  98.5 F (36.9 C) 97.5 F (36.4 C)  TempSrc: Oral  Oral Oral  Resp: 19  18 18   Height:      Weight:    290 lb 12.6 oz (131.9 kg)  SpO2: 91% 92% 95% 93%   Weight change: -1.8 oz (-0.051 kg)  Intake/Output Summary (Last 24 hours) at 12/28/11 0802 Last data filed at 12/28/11 0406  Gross per 24 hour  Intake    420 ml  Output   2175 ml  Net  -1755 ml   General:sitting on the side of the bed, nad, alert and oriented x 3  HEENT:[place]/at CV: RRR no rubs, murmurs or gallops Lungs: mild wheezes right lung field.  Left lung field with minimal crackles Abdomen: obese, soft, mild ttp lower ab b/l, normal bs Extremities: warm, no cyanosis or edema, scds intact Neuro: CN 2-12 grossly intact able to move all 4 extremities. Skin: scar to left wrist   Lab Results: Basic Metabolic Panel:  Lab 12/28/11 6578 12/27/11 0636 12/23/11 0857  NA 141 138 --  K 5.3* 4.9 --  CL 103 101 --  CO2 31 29 --  GLUCOSE 114* 115* --  BUN 16 15 --  CREATININE 0.76 0.79 --  CALCIUM 9.2 9.0 --  MG -- -- 2.2  PHOS -- -- --    CBC:  Lab 12/28/11 0600 12/27/11 0636 12/26/11 0943 12/25/11 2155  WBC 9.8 10.9* -- --  NEUTROABS -- -- 7.5 9.2*  HGB 9.7* 9.8* -- --  HCT 34.0* 33.8* -- --  MCV 78.9 77.9* -- --  PLT 343 301 -- --    Coagulation:  Lab 12/28/11 0600 12/26/11 0943 12/25/11 0120 12/22/11 1700  LABPROT 15.5* 14.3 16.5* 23.8*  INR 1.25 1.13 1.37 2.24*   Misc. Labs: none   Micro Results: Recent Results (from the past 240 hour(s))  MRSA PCR SCREENING     Status: Abnormal   Collection Time   12/23/11 12:22 AM      Component  Value Range Status Comment   MRSA by PCR POSITIVE (*) NEGATIVE Final   CULTURE, EXPECTORATED SPUTUM-ASSESSMENT     Status: Normal   Collection Time   12/23/11  3:31 AM      Component Value Range Status Comment   Specimen Description SPUTUM   Final    Special Requests NONE   Final    Sputum evaluation     Final    Value: THIS SPECIMEN IS ACCEPTABLE. RESPIRATORY CULTURE REPORT TO FOLLOW.   Report Status 12/23/2011 FINAL   Final   CULTURE, RESPIRATORY     Status: Normal   Collection Time   12/23/11  3:31 AM      Component Value Range Status Comment   Specimen Description SPUTUM   Final    Special Requests NONE   Final    Gram Stain     Final    Value: MODERATE WBC PRESENT,BOTH PMN AND MONONUCLEAR     MODERATE SQUAMOUS EPITHELIAL CELLS PRESENT     NO ORGANISMS SEEN   Culture  NORMAL OROPHARYNGEAL FLORA   Final    Report Status 12/25/2011 FINAL   Final   URINE CULTURE     Status: Normal   Collection Time   12/23/11  3:48 AM      Component Value Range Status Comment   Specimen Description URINE, CLEAN CATCH   Final    Special Requests NONE   Final    Culture  Setup Time 12/23/2011 04:22   Final    Colony Count >=100,000 COLONIES/ML   Final    Culture ESCHERICHIA COLI   Final    Report Status 12/25/2011 FINAL   Final    Organism ID, Bacteria ESCHERICHIA COLI   Final    Studies/Results: Dg Chest 2 View  12/26/2011  *RADIOLOGY REPORT*  Clinical Data: Cough and shortness of breath.  CHEST - 2 VIEW  Comparison: Plain films of the chest 12/23/2011.  Findings: Diffuse bilateral airspace disease persists with some improvement in aeration in the right lower lobe.  Small bilateral pleural effusions are noted.  There is cardiomegaly.  IMPRESSION: Mild improvement in diffuse bilateral airspace disease like reflecting pulmonary edema.   Original Report Authenticated By: Holley Dexter, M.D.    Medications:  Scheduled Meds:    . albuterol  2.5 mg Nebulization TID  . [EXPIRED] Chlorhexidine  Gluconate Cloth  6 each Topical Q0600  . enoxaparin (LOVENOX) injection  40 mg Subcutaneous Q24H  . ferrous sulfate  325 mg Oral TID WC  . furosemide  40 mg Intravenous BID  . gabapentin  100 mg Oral TID  . ipratropium  0.5 mg Nebulization TID  . lamoTRIgine  200 mg Oral q morning - 10a  . mupirocin ointment  1 application Nasal BID  . nicotine  14 mg Transdermal Daily  . pantoprazole  40 mg Oral Daily  . potassium chloride  40 mEq Oral TID  . QUEtiapine  400 mg Oral QHS  . sertraline  200 mg Oral Daily  . simvastatin  20 mg Oral q1800  . sodium chloride  3 mL Intravenous Q12H  . sodium chloride  3 mL Intravenous Q12H  . sodium polystyrene  30 g Oral Once  . [COMPLETED] warfarin  4 mg Oral ONCE-1800  . Warfarin - Pharmacist Dosing Inpatient   Does not apply q1800  . [DISCONTINUED] pantoprazole  40 mg Oral Q12H  . [DISCONTINUED] pantoprazole (PROTONIX) IV  40 mg Intravenous Q12H   Continuous Infusions:    . sodium chloride 10 mL/hr (12/25/11 2123)   PRN Meds:.sodium chloride, acetaminophen, acetaminophen, albuterol, dextromethorphan, LORazepam, ondansetron (ZOFRAN) IV, ondansetron, sodium chloride, [DISCONTINUED] HYDROcodone-acetaminophen  Assessment/Plan: 66 y.o PMH interstitial lung disease on 2 L home O2, tobacco abuse depression/anxiety, GERD, HTN, history of HLD, history of irritable bowel syndrome, spinal stenosis, pulmonary embolism/DVT on Coumadin prior to admission, history of OSA, s/p mastectomy for breast cancer, stroke, anemia.  She presented for weakness, shortness of breath and cough noted to have a hemoglobin of 4.8 on admission and a history of black bowel movements prior to admission.     1. Chronic respiratory failure secondary to interstitial lung disease on home O2 and tobacco abuse -Wheezing improved today though still present.  -Will continue Albuterol and Atrovent nebs tid, and Albuterol neb prn  -Asked RN to document O2 sat off O2 today at rest and with  ambulation (pt will need a walker to ambulate)  2. 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 Elnoria Howard 12/5 s/p APC.  The bleeding was stopped with the procedure.  -repeat EGD 1 month -consider repeat CBC q 3 months due to the patient being on anticoagulation.  -Protonix 40 mg qd   3. Acute CHF secondary to high output heart failure due to anemia -continue to diurese Lasix 40 mg iv bid  -fluid restricted diet  -strict i/o; out  2.175 L over 24 hours net negative 5.736 L, daily weights (down 21 lbs since admission 311-->290 lbs) -TEE with normal systolic function indeterminate diastolic dysfunction and evidence of pulmonary HTN   4. History of HYPERTENSION but slightly hypotensive this admission -will monitor, controlled   5. 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 E.coli  UTI -treated 3 days of Rocephin now d/c  7. MRSA + nares -protocol  8. DVT Px  -Lovenox q24 until therapeutic Coumadin per pharmacy   9. F/E/N -will monitor electrolytes (Hyperkalemic today given Kayexylate 30 g x 1). Recheck k at 5 PM -fluid restricted heart diet 1 L   Dispo: refuses SNF. Will likely d/c home possibly 12/9 with established home health   The patient does have a current PCP (KLIMA, Smith Mince, MD), therefore is requiring OPC follow-up after discharge.   The patient does have transportation limitations that hinder transportation to clinic appointments.  .Services Needed at time of discharge: Y = Yes, Blank = No PT:   OT:   RN:   Equipment:   Other:     LOS: 6 days   Annett Gula [phone] 12/28/2011, 8:02 AM

## 2011-12-29 ENCOUNTER — Encounter (HOSPITAL_COMMUNITY): Payer: Self-pay | Admitting: Internal Medicine

## 2011-12-29 DIAGNOSIS — K31819 Angiodysplasia of stomach and duodenum without bleeding: Secondary | ICD-10-CM | POA: Diagnosis present

## 2011-12-29 DIAGNOSIS — J441 Chronic obstructive pulmonary disease with (acute) exacerbation: Secondary | ICD-10-CM

## 2011-12-29 HISTORY — DX: Angiodysplasia of stomach and duodenum without bleeding: K31.819

## 2011-12-29 LAB — BASIC METABOLIC PANEL
BUN: 16 mg/dL (ref 6–23)
Calcium: 9 mg/dL (ref 8.4–10.5)
Creatinine, Ser: 0.66 mg/dL (ref 0.50–1.10)
GFR calc Af Amer: >90 mL/min (ref >90)
GFR calc non Af Amer: 89 mL/min — ABNORMAL LOW (ref >90)
Glucose, Bld: 116 mg/dL — ABNORMAL HIGH (ref 70–99)

## 2011-12-29 LAB — CBC
HCT: 34 % — ABNORMAL LOW (ref 36.0–46.0)
Hemoglobin: 9.6 g/dL — ABNORMAL LOW (ref 12.0–15.0)
MCH: 22.1 pg — ABNORMAL LOW (ref 26.0–34.0)
MCHC: 28.2 g/dL — ABNORMAL LOW (ref 30.0–36.0)
MCV: 78.3 fL (ref 78.0–100.0)
Platelets: 324 10*3/uL (ref 150–400)
RBC: 4.34 MIL/uL (ref 3.87–5.11)
RDW: 25.7 % — ABNORMAL HIGH (ref 11.5–15.5)
WBC: 8.8 10*3/uL (ref 4.0–10.5)

## 2011-12-29 LAB — PROTIME-INR
INR: 1.24 (ref 0.00–1.49)
Prothrombin Time: 15.4 seconds — ABNORMAL HIGH (ref 11.6–15.2)

## 2011-12-29 MED ORDER — WARFARIN SODIUM 4 MG PO TABS: 4.0000 mg | ORAL_TABLET | ORAL | Status: DC

## 2011-12-29 MED ORDER — WARFARIN SODIUM 4 MG PO TABS
4.0000 mg | ORAL_TABLET | Freq: Once | ORAL | Status: DC
  Filled 2011-12-29: qty 1

## 2011-12-29 MED ORDER — PANTOPRAZOLE SODIUM 40 MG PO TBEC: 40.0000 mg | DELAYED_RELEASE_TABLET | Freq: Every day | ORAL | Status: DC

## 2011-12-29 MED ORDER — IPRATROPIUM BROMIDE 0.02 % IN SOLN: 0.5000 mg | Freq: Three times a day (TID) | RESPIRATORY_TRACT | Status: DC

## 2011-12-29 MED ORDER — ALBUTEROL SULFATE (5 MG/ML) 0.5% IN NEBU: 2.5000 mg | INHALATION_SOLUTION | Freq: Four times a day (QID) | RESPIRATORY_TRACT | Status: DC | PRN

## 2011-12-29 NOTE — Progress Notes (Signed)
Internal Medicine Teaching Service Attending Note Date: 12/29/2011  Patient name: [name]  Medical record number: [id number]  Date of birth: [birth year]    This patient has been seen and discussed with the house staff. Please see their note for complete details. I concur with their findings with the following additions/corrections: Patient will be discharged home today with physical therapy and occupational therapy at home.  Lars Mage 12/29/2011, 1:43 PM

## 2011-12-29 NOTE — Progress Notes (Addendum)
Subjective: Ab pain is 3/10.  She is breathing better.   She feels tired otherwise no other complaints.   RN: patient does not want to get out of bed.   Objective: Vital signs in last 24 hours: Filed Vitals:   12/28/11 2003 12/28/11 2159 12/29/11 0628 12/29/11 0852  BP:  130/64 103/47   Pulse:  84 80   Temp:  98.7 F (37.1 C) 98.6 F (37 C)   TempSrc:  Oral Oral   Resp:  20 20   Height:      Weight:   289 lb 14.4 oz (131.498 kg)   SpO2: 94% 96% 95% 93%   Weight change: -14.2 oz (-0.402 kg)  Intake/Output Summary (Last 24 hours) at 12/29/11 1044 Last data filed at 12/29/11 0849  Gross per 24 hour  Intake    720 ml  Output   1675 ml  Net   -955 ml   General:lying in bed, nad HEENT:[place]/at CV: RRR no rubs, murmurs or gallops Lungs: ctab Abdomen: obese, soft, mild ttp lower ab b/l, normal bs Extremities: warm, no cyanosis or edema, scds intact Neuro: CN 2-12 grossly intact able to move all 4 extremities. Skin: scar to left wrist   Lab Results: Basic Metabolic Panel:  Lab 12/29/11 1610 12/28/11 1737 12/28/11 0600 12/23/11 0857  NA 138 -- 141 --  K 4.5 4.8 -- --  CL 103 -- 103 --  CO2 28 -- 31 --  GLUCOSE 116* -- 114* --  BUN 16 -- 16 --  CREATININE 0.66 -- 0.76 --  CALCIUM 9.0 -- 9.2 --  MG -- -- -- 2.2  PHOS -- -- -- --    CBC:  Lab 12/29/11 0629 12/28/11 0600 12/26/11 0943 12/25/11 2155  WBC 8.8 9.8 -- --  NEUTROABS -- -- 7.5 9.2*  HGB 9.6* 9.7* -- --  HCT 34.0* 34.0* -- --  MCV 78.3 78.9 -- --  PLT 324 343 -- --    Coagulation:  Lab 12/29/11 0629 12/28/11 0600 12/26/11 0943 12/25/11 0120  LABPROT 15.4* 15.5* 14.3 16.5*  INR 1.24 1.25 1.13 1.37   Misc. Labs: none   Micro Results: Recent Results (from the past 240 hour(s))  MRSA PCR SCREENING     Status: Abnormal   Collection Time   12/23/11 12:22 AM      Component Value Range Status Comment   MRSA by PCR POSITIVE (*) NEGATIVE Final   CULTURE, EXPECTORATED SPUTUM-ASSESSMENT     Status: Normal    Collection Time   12/23/11  3:31 AM      Component Value Range Status Comment   Specimen Description SPUTUM   Final    Special Requests NONE   Final    Sputum evaluation     Final    Value: THIS SPECIMEN IS ACCEPTABLE. RESPIRATORY CULTURE REPORT TO FOLLOW.   Report Status 12/23/2011 FINAL   Final   CULTURE, RESPIRATORY     Status: Normal   Collection Time   12/23/11  3:31 AM      Component Value Range Status Comment   Specimen Description SPUTUM   Final    Special Requests NONE   Final    Gram Stain     Final    Value: MODERATE WBC PRESENT,BOTH PMN AND MONONUCLEAR     MODERATE SQUAMOUS EPITHELIAL CELLS PRESENT     NO ORGANISMS SEEN   Culture NORMAL OROPHARYNGEAL FLORA   Final    Report Status 12/25/2011 FINAL   Final  URINE CULTURE     Status: Normal   Collection Time   12/23/11  3:48 AM      Component Value Range Status Comment   Specimen Description URINE, CLEAN CATCH   Final    Special Requests NONE   Final    Culture  Setup Time 12/23/2011 04:22   Final    Colony Count >=100,000 COLONIES/ML   Final    Culture ESCHERICHIA COLI   Final    Report Status 12/25/2011 FINAL   Final    Organism ID, Bacteria ESCHERICHIA COLI   Final    Studies/Results: No results found. Medications:  Scheduled Meds:    . albuterol  2.5 mg Nebulization TID  . enoxaparin (LOVENOX) injection  40 mg Subcutaneous Q24H  . ferrous sulfate  325 mg Oral TID WC  . furosemide  40 mg Intravenous BID  . gabapentin  100 mg Oral TID  . ipratropium  0.5 mg Nebulization TID  . lamoTRIgine  200 mg Oral q morning - 10a  . nicotine  14 mg Transdermal Daily  . pantoprazole  40 mg Oral Daily  . QUEtiapine  400 mg Oral QHS  . sertraline  200 mg Oral Daily  . simvastatin  20 mg Oral q1800  . sodium chloride  3 mL Intravenous Q12H  . sodium chloride  3 mL Intravenous Q12H  . [COMPLETED] warfarin  4 mg Oral ONCE-1800  . Warfarin - Pharmacist Dosing Inpatient   Does not apply q1800  . [COMPLETED] sodium  polystyrene  30 g Oral Once   Continuous Infusions:    . sodium chloride 10 mL/hr (12/25/11 2123)   PRN Meds:.sodium chloride, acetaminophen, acetaminophen, albuterol, dextromethorphan, LORazepam, ondansetron (ZOFRAN) IV, ondansetron, sodium chloride  Assessment/Plan: 66 y.o PMH interstitial lung disease on 2 L home O2, tobacco abuse depression/anxiety, GERD, HTN, history of HLD, history of irritable bowel syndrome, spinal stenosis, pulmonary embolism/DVT on Coumadin prior to admission, history of OSA, s/p mastectomy for breast cancer, stroke, anemia.  She presented for weakness, shortness of breath and cough noted to have a hemoglobin of 4.8 on admission and a history of black bowel movements prior to admission.     1. Chronic respiratory failure secondary to interstitial lung disease on home O2 and tobacco abuse -lung exam ctab -Will continue Albuterol and Atrovent nebs prn at home which helped patient   -will d/c with home O2 prn for desaturation with ambulation  2. 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 Elnoria Howard 12/5 s/p APC.  The bleeding was stopped with the procedure.  -repeat EGD 1 month with Dr. Elnoria Howard appt 12/31/11 2:45 PM  -consider repeat CBC q 3 months due to the patient being on anticoagulation.  -Protonix 40 mg qd   3. Acute CHF secondary to high output heart failure due to anemia -diuresed with Lasix iv 40 bid this admission. She is down 22 pounds with net neg 5.6 L since admission  -fluided restricted diet this admission -TTE with normal systolic function indeterminate diastolic dysfunction and evidence of pulmonary HTN   4. History of HYPERTENSION but slightly hypotensive this admission -will monitor, controlled   5. History of thromboembolism previously on Coumadin -INR  1.24, will d/c Lovenox -Will d/c with Coumadin 4 mg today and tomorrow. Thursday patient is to resume 2 mg and  alternating with 4 mg qd per Pharmacy  -patient needs INR probably Thursday or Friday post discharge.   6. DVT Px  -Lovenox, Coumadin  7. F/E/N -will monitor electrolytes  -fluid restricted heart diet 1 L   Dispo: Patient refuses SNF though recommended by OT/PT.   Attempted to get into inpatient rehab but her insurance will not cover inpatient rehab.  D/c home 12/9 with established home health ([facility] and PT/OT home services)   The patient does have a current PCP (Josem Kaufmann, Smith Mince, MD), therefore is requiring OPC follow-up after discharge.   The patient does have transportation limitations that hinder transportation to clinic appointments.  .Services Needed at time of discharge: Y = Yes, Blank = No PT:   OT:   RN:   Equipment:   Other:     LOS: 7 days   [name] [phone] 12/29/2011, 10:44 AM

## 2011-12-29 NOTE — Progress Notes (Signed)
Patient is stable for d/c today per MD- patient is now requesting transportation home via ambulance. Discussed other options for transport but patient states that she does not have any family to help her and she "always" goes home by ambulance. Discussed feasibility of [facility] but she states that they cannot help her get into her bed etc. Patient related that she has not had any contact with her personal care aide via [facility].  During visit- patient contacted the agency and message was left to call her back regarding her d/c today.  Patient continues to verbalize to this CSW that she "won't have anyone with her at night and when the aide is gone."  CSW strongly encouraged patient to reconsider short term SNF but she remains adamant that she will not be placed.  Discussed with RNCM- patient's Home Health will be restarted.  EMS transport arranged for around 1 pm pick up. Notified pt's nurse of above.  Lorri Frederick. [place]  [phone]

## 2011-12-29 NOTE — Discharge Summary (Signed)
Internal Medicine Teaching [facility] Discharge Note  Name: [name] MRN: [id number] DOB: 04-08-1945 66 y.o.  Date of Admission: 12/22/2011  3:13 PM Date of Discharge: 12/29/2011 Attending Physician: Lars Mage, MD  Discharge Diagnosis: 1.  *Anemia associated with acute blood loss and Iron deficiency anemia 2. H/O chronic respiratory failure secondary to interstitial lung disease  3. Acute exacerbation of CHF (congestive heart failure) secondary to high output heart failure due to anemia 4. E. coli urinary tract infection 5. MRSA positive nares 6. History of Hypertension, controlled  7. History of pulmonary embolus (2009) 8. TOBACCO ABUSE 9. History of Anxiety/DEPRESSION 10. Resolved hyperkalemia/Hypokalemia 11. Physical deconditioning   Discharge Medications:   Medication List     As of 12/29/2011 12:34 PM    TAKE these medications         albuterol (5 MG/ML) 0.5% nebulizer solution   Commonly known as: PROVENTIL   Take 0.5 mLs (2.5 mg total) by nebulization every 6 (six) hours as needed for wheezing or shortness of breath.      ferrous sulfate 325 (65 FE) MG EC tablet   Take 325 mg by mouth 3 (three) times daily with meals.      HYDROcodone-acetaminophen 10-325 MG per tablet   Commonly known as: NORCO   Take 1 tablet by mouth every 8 (eight) hours as needed. For pain      ipratropium 0.02 % nebulizer solution   Commonly known as: ATROVENT   Take 2.5 mLs (0.5 mg total) by nebulization 3 (three) times daily.      lamoTRIgine 200 MG tablet   Commonly known as: LAMICTAL   Take 200 mg by mouth every morning.      LORazepam 1 MG tablet   Commonly known as: ATIVAN   Take 1-2 mg by mouth 2 (two) times daily. Take one tablet in morning and two tablets by mouth at bedtime      NEURONTIN 100 MG capsule   Generic drug: gabapentin   Take 100 mg by mouth 3 (three) times daily.      omeprazole 20 MG capsule   Commonly known as: PRILOSEC   Take 20 mg by mouth  daily.      pantoprazole 40 MG tablet   Commonly known as: PROTONIX   Take 1 tablet (40 mg total) by mouth daily.      pravastatin 40 MG tablet   Commonly known as: PRAVACHOL   Take 40 mg by mouth daily.      SEROQUEL 200 MG tablet   Generic drug: QUEtiapine   Take 400 mg by mouth at bedtime.      vitamin C 500 MG tablet   Commonly known as: ASCORBIC ACID   Take 1 tablet (500 mg total) by mouth daily.      warfarin 4 MG tablet   Commonly known as: COUMADIN   Take 2-4 mg by mouth daily. Takes 1/2 tab(2mg ) every other day alternating with 1 tablet(4mg )      warfarin 4 MG tablet   Commonly known as: COUMADIN   Take 1 tablet (4 mg total) by mouth as directed.      ZOLOFT 100 MG tablet   Generic drug: sertraline   Take 200 mg by mouth daily.         Disposition and follow-up:   [name] was discharged from [facility] in stable condition.  At the hospital follow up visit please address  1) Check INR, CBC, BMET 2)  Refer to social work for transportation issues  3) Address physical activity and smoking cessation. May need Nicotine patches  4) CBC every 3 months due to patient being on anticoagulation  Follow-up Appointments: Follow-up Information    Follow up with [facility]. [facility] Nurse, Physical Therapy, and Occupational Therapy)    Contact information:   [phone]        Discharge Orders    Future Appointments: Provider: Department: Dept Phone: Center:   12/30/2011 1:15 PM Windell Hummingbird [facility] MEDICAL ONCOLOGY [phone] None   12/30/2011 1:45 PM Myrtis Ser, NP [facility] MEDICAL ONCOLOGY [phone] None   01/07/2012 2:45 PM Manuela Schwartz, MD [facility] [phone] [facility]   01/30/2012 10:45 AM Rocco Serene, MD [facility] [phone] [facility]     Future Orders Please Complete By Expires   For home use only DME 3  n 1      Comments:   Bariatric 3:1   For home use only DME oxygen      Comments:   Attempted to transfer on room air but pt's sats declined to 85% needing O2 reapplied, once resting with O2 pt above 90% on 2 liters   Questions: Responses:   Mode or (Route)    Liters per Minute 2   Frequency    Oxygen conserving device    Diet - low sodium heart healthy      Increase activity slowly      Discharge instructions      Comments:   Follow with Dr. Bosie Clos 01/07/12 2:45 PM with Dr. Bosie Clos [phone].  Please see/call the social worker Lynnae January [phone]   Follow up with Dr. Elnoria Howard 12/31/11 2:45 PM [phone]  Please take 4 mg of Coumadin Monday and Tuesday. Resume 2 mg on Wednesday alternating with 4 mg like you were doing before you came into the hospital.  Please try to get your INR checked on Thursday or Friday 12/12 or 12/13     Consultations:  Internal Medicine-Dr. Eben Burow Gastroenterology-Dr. Elnoria Howard   Procedures Performed:  Dg Chest 2 View  12/26/2011  *RADIOLOGY REPORT*  Clinical Data: Cough and shortness of breath.  CHEST - 2 VIEW  Comparison: Plain films of the chest 12/23/2011.  Findings: Diffuse bilateral airspace disease persists with some improvement in aeration in the right lower lobe.  Small bilateral pleural effusions are noted.  There is cardiomegaly.  IMPRESSION: Mild improvement in diffuse bilateral airspace disease like reflecting pulmonary edema.   Original Report Authenticated By: Holley Dexter, M.D.    Dg Chest 2 View  12/23/2011  *RADIOLOGY REPORT*  Clinical Data: Pneumonia; chest pain and shortness of breath  CHEST - 2 VIEW  Comparison: December 22, 2011  Findings:  There is cardiomegaly with widespread interstitial and patchy alveolar edema, stable.  There is a degree of pulmonary venous hypertension.  There is no new opacity.  There is mild thoracic dextroscoliosis.  There is postoperative change in the lower cervical spine.  IMPRESSION: Stable chest.  Findings felt to  represent congestive heart failure. Cannot exclude superimposed infectious pneumonia in the right upper lobe.   Original Report Authenticated By: Bretta Bang, M.D.    Dg Chest 2 View (if Patient Has Fever And/or Copd)  12/22/2011  *RADIOLOGY REPORT*  Clinical Data: Chest pain, shortness of breath, cough  CHEST - 2 VIEW  Comparison: 05/01/2011  Findings: Worsening diffuse airspace disease versus edema throughout both lungs.  Heart is enlarged.  Central vascularity prominent.  CHF is favored.  No large effusion or pneumothorax.  IMPRESSION: Worsening diffuse airspace process versus edema.  CHF is favored.   Original Report Authenticated By: Judie Petit. Miles Costain, M.D.    2D Echo:  [facility]* *[facility]* [address] [place], [place] [address] [phone]  ------------------------------------------------------------ Transthoracic Echocardiography  Patient: Arabella, Revelle MR #: [id number] Study Date: 12/26/2011 Gender: F Age: 2 Height: 170.2cm Weight: 132kg BSA: 2.87m^2 Pt. Status: Room: 4736  PERFORMING [place], [facility] [place], Ankit REFERRING [place] Poisson SONOGRAPHER Georgian Co, RDCS, CCT cc:  ------------------------------------------------------------ LV EF: 55% - 60%  ------------------------------------------------------------ Indications: CHF - 428.0.  ------------------------------------------------------------ History: PMH: Dyspnea. Congestive heart failure. Risk factors: Interstitial lung disease. Venous embolism. Acute blood loss. Obstructive sleep apnea. Current tobacco use. Hypertension. Dyslipidemia.  ------------------------------------------------------------ Study Conclusions  - Left ventricle: The cavity size was normal. Wall thickness was increased in a pattern of mild LVH. Systolic function was normal. The estimated ejection fraction was in the range of 55% to 60%. Indeterminant diastolic function. Wall  motion was normal; there were no regional wall motion abnormalities. - Aortic valve: There was no stenosis. - Mitral valve: No significant regurgitation. - Left atrium: The atrium was moderately dilated. - Right ventricle: The cavity size was normal. Systolic function was normal. - Right atrium: The atrium was mildly dilated. - Tricuspid valve: Peak RV-RA gradient: 34mm Hg (S). - Pulmonary arteries: PA systolic pressure 40-44 mmHg. - Systemic veins: IVC measured 1.9 cm with normal respirophasic variation, suggesting RA pressure 6-10 mmHg. Impressions:  - Normal LV size and systolic function, EF 55-60%. Mild LV hypertrophy. Normal RV size and systolic function. Mild pulmonary hypertension. Biatrial enlargement. Transthoracic echocardiography. M-mode, complete 2D, spectral Doppler, and color Doppler. Height: Height: 170.2cm. Height: 67in. Weight: Weight: 132kg. Weight: 290.4lb. Body mass index: BMI: 45.6kg/m^2. Body surface area: BSA: 2.25m^2. Blood pressure: 109/69. Patient status: Inpatient. Location: Bedside.  ------------------------------------------------------------  ------------------------------------------------------------ Left ventricle: The cavity size was normal. Wall thickness was increased in a pattern of mild LVH. Systolic function was normal. The estimated ejection fraction was in the range of 55% to 60%. Indeterminant diastolic function. Wall motion was normal; there were no regional wall motion abnormalities.  ------------------------------------------------------------ Aortic valve: Trileaflet. Doppler: There was no stenosis. No regurgitation.  ------------------------------------------------------------ Aorta: Aortic root: The aortic root was normal in size. Ascending aorta: The ascending aorta was normal in size.  ------------------------------------------------------------ Mitral valve: Doppler: There was no evidence for stenosis. No significant  regurgitation. Peak gradient: 8mm Hg (D).  ------------------------------------------------------------ Left atrium: The atrium was moderately dilated.  ------------------------------------------------------------ Right ventricle: The cavity size was normal. Systolic function was normal.  ------------------------------------------------------------ Pulmonic valve: Structurally normal valve. Cusp separation was normal. Doppler: Transvalvular velocity was within the normal range. No regurgitation.  ------------------------------------------------------------ Tricuspid valve: Doppler: Trivial regurgitation.  ------------------------------------------------------------ Pulmonary artery: PA systolic pressure 40-44 mmHg.  ------------------------------------------------------------ Right atrium: The atrium was mildly dilated.  ------------------------------------------------------------ Pericardium: There was no pericardial effusion.  ------------------------------------------------------------ Systemic veins: IVC measured 1.9 cm with normal respirophasic variation, suggesting RA pressure 6-10 mmHg.  ------------------------------------------------------------  2D measurements Normal Doppler Normal Left ventricle measurements LVID ED, 48.4 mm 43-52 Left ventricle chord, Ea, lat 13.9 cm/ ------- PLAX ann, tiss s LVID ES, 36.1 mm 23-38 DP chord, E/Ea, lat 10.22 ------- PLAX ann, tiss FS, chord, 25 % >29 DP PLAX Ea, med 10.8 cm/ ------- LVPW, ED 9.28 mm ------ ann, tiss s IVS/LVPW 0.98 <1.3 DP ratio, ED  E/Ea, med 13.15 ------- Vol ED, 96 ml ------ ann, tiss MOD1 DP Vol ES, 49 ml ------ Mitral valve MOD1 Peak E vel 142 cm/ ------- EF, MOD1 49 % ------ s Vol index, 41 ml/m^2 ------ Peak A vel 50.8 cm/ ------- ED, MOD1 s Vol index, 21 ml/m^2 ------ Deceleratio 232 ms 150-230 ES, MOD1 n time Ventricular septum Peak 8 mm ------- IVS, ED 9.11 mm ------ gradient, D Hg Aorta  Peak E/A 2.8 ------- Root diam, 26 mm ------ ratio ED Tricuspid valve Left atrium Regurg peak 292 cm/ ------- AP dim 47 mm ------ vel s AP dim 1.98 cm/m^2 <2.2 Peak RV-RA 34 mm ------- index gradient, S Hg Vol, S 116 ml ------ Right ventricle Vol index, 48.9 ml/m^2 ------ Sa vel, lat 14.3 cm/ ------- S ann, tiss s Right ventricle DP RVID ED, 31.3 mm 19-38 PLAX  ------------------------------------------------------------ Prepared and Electronically Authenticated by  Marca Ancona 2013-12-06T17:14:34.537      Admission HPI:  Ms. 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 She has chest pain associated with coughing only. She denies any PND or orthopnea, but has 2-pillow orthopnea. She is on home oxygen 2L and has not increased her O2. In addition she has had chronic urge incontinence for years but recently has had burning sensation with urination. Her appetite has been decreased for the past 3 days. She states that her stool is brown though reports black bowel movements prior to admission at least once and intermittently. Denies nausea or vomiting, abdominal pain, melena, hematemesis, hematochezia. She denies any change in bowel movement pattern, she moves her bowel once daily. No diarrhea or constipation. No recent weightloss. Denies any NSAIDs or steroid use. She has GERD and is taking PPI. In the ED, patient was found to have Hb of 4.8 and repeat was 6.1.  Review of Systems:  Constitutional: + fever, +chills, +diaphoresis, +appetite change and fatigue.  HEENT: Denies  photophobia, eye pain, redness, hearing loss, ear pain, congestion, sore throat, rhinorrhea, sneezing, mouth sores, trouble swallowing, neck pain, neck stiffness and tinnitus.  Respiratory: + SOB, +DOE,+ cough, chest tightness, and +wheezing.  Cardiovascular: Denies chest pain, palpitations and+ leg swelling.  Gastrointestinal: Denies nausea, vomiting, abdominal pain, diarrhea, constipation, blood in stool and abdominal distention.  Genitourinary: +dysuria, urgency, frequency, hematuria, flank pain and difficulty urinating, +urinary incontinence.  Musculoskeletal: Denies myalgias, back pain, joint swelling, arthralgias and gait problem.  Skin: Denies pallor, rash and wound.  Neurological: Denies dizziness, seizures, syncope, +weakness, light-headedness, numbness and headaches.  Hematological: Denies adenopathy. Easy bruising, personal or family bleeding history  Psychiatric/Behavioral: Denies suicidal ideation, mood changes, confusion,+nervousness, sleep disturbance and agitation  Physical Exam:  Blood pressure 125/76, pulse 75, temperature 98.2 F (36.8 C), temperature source Oral, resp. rate 16, SpO2 96.00%.  General: alert, morbidly obese, facial pallor, and cooperative to examination., tearful during examination  Head: normocephalic and atraumatic.  Eyes: vision grossly intact, pupils equal, pupils round, pupils reactive to light, no injection and anicteric.  Mouth: pharynx pink and moist, no erythema, and no exudates.  Neck: supple, full ROM, no thyromegaly, no JVD,  and no carotid bruits.  Lungs: normal respiratory effort, no accessory muscle use, coarse breath sounds, + crisp, fine crackles, and +wheezes. Heart: tachycardic, regular rhythm, no murmur, no gallop, and no rub.  Abdomen:obese, soft, non-tender, normal bowel sounds, no distention, no guarding, no rebound tenderness, no hepatomegaly, and no splenomegaly.  Msk: no joint swelling, no joint warmth, and no redness over joints.   Pulses: 2+ DP/PT pulses bilaterally Extremities: No cyanosis, clubbing, +1 pitting edema up to knees bilaterally Neurologic: alert & oriented X3, cranial nerves II-XII intact, strength normal in all extremities, sensation intact to light touch  Skin: turgor normal and no rashes.  Psych: Oriented X3, memory intact for recent and remote, normally interactive, good eye contact, +anxious appearing, and+ depressed appearing, tearful      Hospital Course by problem list: 1.  *Anemia associated with acute blood loss and Iron deficiency anemia Hemoglobin was 4.8 on admission. She was transfused 1 unit pRBCs at [facility] and transferred to [facility].  Her fecal occult blood test was negative this admission.  Her hemoglobin kept dropping despite multiple transfusions and upper endoscopy by Dr. 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 GAVE can cause iron deficiency anemia, slow chronic GI blood loss.  She was placed on Protonix 40 mg intravenous bid and transitioned to oral by discharge.  She has received a total of 7 units of blood this admission.  The bleeding was stopped with the cauterization procedure and hemoglobin was 9.6 on the day of discharge.  She will have follow up with Dr. Elnoria Howard 12/31/11 who recommends a repeat upper endoscopy in 1 month.  She was given Hydrocodone for abdominal pain after procedure as needed but this was discharge by discharge because the pain was  likely musculoskeletal in origin and will likely respond to acetaminophen only.  She will be continued on anticoagulation for history of thrombotic events.  We recommend considering repeat CBC q 3 months due to the patient being on anticoagulation with history of GAVE.    2. H/O chronic respiratory failure secondary to interstitial lung disease  Pulmonary function tests have previously indicated restrictive pattern though the patient is still actively smoking.  We had smoking cessation  counseling.  She was placed on nasal canula and had been using it prior to admission.  O2 saturations were normal this admission on nasal cannula though her oxygen decreased to 85% with ambulation needing O2 reapplied.  Once resting with O2 her oxygen was above 90% on 2 liters.  This is an indication for home oxygen.  On admission there was concern for community acquired pneumonia so she was started on Azithromycin but discontinued on the first day of admission.  On further assessment if did not appear that the patient had community acquired pneumonia.  She had cough with increased sputum intermittently which could be related to chronic smoking.  She also had course breath sounds this admission which improved with diuresis and diffuse wheezing worse initial days of admission but improved with Atrovent/Albuterol nebulizer treatments.  We will discharge her with these nebulizers.    3. Acute exacerbation of CHF (congestive heart failure) secondary to high output heart failure due to anemia She was not tachypneic or respiratory distress or with lower extremitiy edema.  She did have an elevated BNP 1346 this admission.  Multiple chest xrays were performed this admission with improvement.  We diuresed her with Lasix 40 mg intravenously twice a day.  She was previously Lasix naive.  Troponin were negative x 3.  Initial EKG had mild ST depression in II, III, avF.  Repeat EKG was improved.   An echo 2010 with EF 55-60% no diastolic or systolic dysfunction noted.  Repeat echo this admission revealed EF 55-60% without comment regarding diastolic heart function (indeterminate); heart function and serial imaging improved after correction of anemia in addition to diuresis.  We placed her on a 1L fluid restricted diet this admission with strict intake and output and daily weights.  She was down 22 pounds since admission net negative 5.6 L.  We will not discharge her home on Lasix.    4. E. coli urinary tract  infection Symptoms of dysuria were noted on admission and she was treated with Rocephin for 3 days this admission.    5. MRSA positive nares She was treated per protocol.   6. History of Hypertension, controlled She is not on any antihypertensives currently.   7. History of pulmonary embolus (2009)/DVT  INR 2.24 on admission.  We held her Coumadin due to acute blood loss.  We resumed Coumadin 12/26/11 after upper endoscopy due to ongoing risk factors smoking, immobility, history of malignancy for thrombosis.  We also gave her subcutaneous Lovenox for bridging purposes but per Up to Date we do not need to discharge the patient on Lovenox only Coumadin due to her thrombotic event not being acute.  INR on day of discharge was 1.24.  We will have her home health agency check an INR 12/12 or 01/02/12 and fax to the Internal Medicine clinic.  We discharged her with Coumadin 4 mg 12/9 and 12/10.  12/11 the patient is to resume 2 mg and alternating with 4 mg daily per Pharmacy.  2 mg alternating with 4 mg was her home dose.  She follows with Hematology/Oncology Dr. Gaylyn Rong for her Coumadin.    8. TOBACCO ABUSE We counseled he on smoking cessation and provided her with a Nicotine patch.    9. History of Anxiety/DEPRESSION We continued her on her home medications for mood and anxiety.    10. Resolved hyperkalemia/Hypokalemia She was Lasix naive but needed diuresis due to suspected acute exacerbation of CHF due to high output heart failure secondary to anemia.  She likely excreted her potassium in her urine.  So we placed her on potassium supplementation this admission which was discontinued prior to discharge due to the patient being hyperkalemic.    11. Physical deconditioning Physical therapy and occupational therapy were consulted this admission.  They recommend skilled nursing facility but the patient refused multiple times.  She does not want to ambulate despite motivation.  We attempted to get her into  rehab but her [employer] will not cover rehab.  At times it did not seem like the patient was motivated to work with these services.  They recommended a bariatric 3:1.  We discharged her with home health RN, PT, OT and she already had [facility].  Spoke with [facility].  They have been going to the patient's home for 4 years.  The patient has been urinating and have stools on the couch.  [facility] comes to her home 3 hours daily Monday-Friday.  2 hours on Saturday and Sunday.  They advised me that the patient has a neighbor who comes to her home when she needs help.      Discharge Vitals:  BP 103/47  Pulse 80  Temp 98.6 F (37 C) (Oral)  Resp 20  Ht 5\' 7"  (  1.702 m)  Wt 289 lb 14.4 oz (131.498 kg)  BMI 45.40 kg/m2  SpO2 93%   Discharge Labs:  Discharge physical Exam General:lying in bed, nad  HEENT:[place]/at  CV: RRR no rubs, murmurs or gallops  Lungs: ctab  Abdomen: obese, soft, mild ttp lower ab b/l, normal bs  Extremities: warm, no cyanosis or edema, scds intact  Neuro: CN 2-12 grossly intact able to move all 4 extremities.  Skin: scar to left wrist      Ref. Range 12/22/2011 17:00 12/22/2011 19:48 12/23/2011 02:00 12/23/2011 08:57 12/24/2011 01:20 12/25/2011 01:20 12/25/2011 21:55 12/26/2011 06:10 12/27/2011 06:36 12/28/2011 06:00 12/28/2011 17:37 12/29/2011 06:29  Sodium Latest Range: 135-145 mEq/L 139 140 142  140 141 141 143 138 141  138  Potassium Latest Range: 3.5-5.1 mEq/L 3.0 (L) 3.2 (L) 3.3 (L)  3.0 (L) 3.7 4.2 4.7 4.9 5.3 (H) 4.8 4.5  Chloride Latest Range: 96-112 mEq/L 101 103 104  100 99 99 103 101 103  103  CO2 Latest Range: 19-32 mEq/L 28  29  32 34 (H) 33 (H) 32 29 31  28   Mean Plasma Glucose Latest Range: <117 mg/dL   161 (H)           BUN Latest Range: 6-23 mg/dL 14 15 14  13 15 13 13 15 16  16   Creatinine Latest Range: 0.50-1.10 mg/dL 0.96 0.45 4.09  8.11 9.14 0.69 0.68 0.79 0.76  0.66  Calcium Latest Range: 8.4-10.5 mg/dL 8.6  8.5  8.3 (L) 8.5 9.1 9.0 9.0  9.2  9.0  GFR calc non Af Amer Latest Range: >90 mL/min 85 (L)  >90  87 (L) 70 (L) 88 (L) 89 (L) 84 (L) 85 (L)  89 (L)  GFR calc Af Amer Latest Range: >90 mL/min >90  >90  >90 82 (L) >90 >90 >90 >90  >90  Glucose Latest Range: 70-99 mg/dL 782 (H) 956 (H) 213 (H)  118 (H) 123 (H) 121 (H) 103 (H) 115 (H) 114 (H)  116 (H)  Calcium Ionized Latest Range: 1.13-1.30 mmol/L  1.06 (L)            Magnesium Latest Range: 1.5-2.5 mg/dL    2.2          Alkaline Phosphatase Latest Range: 39-117 U/L   99  91         Albumin Latest Range: 3.5-5.2 g/dL   2.9 (L)  2.7 (L)         AST Latest Range: 0-37 U/L   16  9         ALT Latest Range: 0-35 U/L   6  5         Total Protein Latest Range: 6.0-8.3 g/dL   6.0  5.5 (L)         Bilirubin, Direct Latest Range: 0.0-0.3 mg/dL   0.1           Indirect Bilirubin Latest Range: 0.3-0.9 mg/dL   0.3           Total Bilirubin Latest Range: 0.3-1.2 mg/dL   0.4  0.2 (L)          Results for EVOLET, SALMINEN (MRN [id number]) as of 12/29/2011 10:22  Ref. Range 12/22/2011 17:05 12/23/2011 02:00 12/23/2011 08:12 12/23/2011 14:51 12/23/2011 22:52  Troponin I Latest Range: <0.30 ng/mL   <0.30 <0.30 <0.30  Troponin i, poc Latest Range: 0.00-0.08 ng/mL 0.00      Pro B Natriuretic peptide (BNP) Latest Range: 0-125 pg/mL  1346.0 (H)      Results for MARCH, JOOS (MRN [id number]) as of 12/29/2011 10:22  Ref. Range 12/23/2011 02:00  Iron Latest Range: 42-135 ug/dL 18 (L)  UIBC Latest Range: 125-400 ug/dL 409  TIBC Latest Range: 250-470 ug/dL 811  Saturation Ratios Latest Range: 20-55 % 4 (L)  Ferritin Latest Range: 10-291 ng/mL 5 (L)  Folate No range found 9.0  Results for JUDEEN, GERALDS (MRN [id number]) as of 12/29/2011 10:22  Ref. Range 12/23/2011 02:00  Vitamin B-12 Latest Range: 211-911 pg/mL 455   Results for XIA, STOHR (MRN [id number]) as of 12/29/2011 10:22  Ref. Range 12/25/2011 21:55 12/26/2011 09:43 12/27/2011 06:36 12/28/2011 06:00 12/29/2011 06:29  WBC Latest Range:  4.0-10.5 K/uL 11.5 (H) 9.6 10.9 (H) 9.8 8.8  RBC Latest Range: 3.87-5.11 MIL/uL 4.56 4.52 4.34 4.31 4.34  Hemoglobin Latest Range: 12.0-15.0 g/dL 46.9 (L) 62.9 (L) 9.8 (L) 9.7 (L) 9.6 (L)  HCT Latest Range: 36.0-46.0 % 34.6 (L) 35.0 (L) 33.8 (L) 34.0 (L) 34.0 (L)  MCV Latest Range: 78.0-100.0 fL 75.9 (L) 77.4 (L) 77.9 (L) 78.9 78.3  MCH Latest Range: 26.0-34.0 pg 22.4 (L) 22.8 (L) 22.6 (L) 22.5 (L) 22.1 (L)  MCHC Latest Range: 30.0-36.0 g/dL 52.8 (L) 41.3 (L) 24.4 (L) 28.5 (L) 28.2 (L)  RDW Latest Range: 11.5-15.5 % 24.4 (H) 24.9 (H) 25.7 (H) 26.3 (H) 25.7 (H)  Platelets Latest Range: 150-400 K/uL 329 337 301 343 324  Neutrophils Relative Latest Range: 43-77 % 80 (H) 78 (H)     Lymphocytes Relative Latest Range: 12-46 % 11 (L) 13     Monocytes Relative Latest Range: 3-12 % 7 6     Eosinophils Relative Latest Range: 0-5 % 2 3     Basophils Relative Latest Range: 0-1 % 0 0     NEUT# Latest Range: 1.7-7.7 K/uL 9.2 (H) 7.5     Lymphocytes Absolute Latest Range: 0.7-4.0 K/uL 1.3 1.2     Monocytes Absolute Latest Range: 0.1-1.0 K/uL 0.8 0.6     Eosinophils Absolute Latest Range: 0.0-0.5 10e3/uL 0.2 0.3     Basophils Absolute Latest Range: 0.0-0.1 K/uL 0.0 0.0     RBC Morphology No range found MARKED POLYCHROMASIA ELLIPTOCYTES     WBC Morphology No range found TOXIC GRANULATION       Results for MOZELL, HABER (MRN [id number]) as of 12/29/2011 10:22  Ref. Range 12/22/2011 17:00 12/22/2011 19:48 12/23/2011 02:00 12/23/2011 08:57 12/23/2011 14:52 12/24/2011 01:20 12/24/2011 09:28 12/25/2011 01:20 12/25/2011 09:01  WBC Latest Range: 4.0-10.5 K/uL 12.6 (H)  11.2 (H) 10.1 10.8 (H) 9.1 8.9 11.5 (H) 9.4  RBC Latest Range: 3.87-5.11 MIL/uL 2.77 (L)  2.92 (L) 3.10 (L) 3.16 (L) 3.14 (L) 3.63 (L) 4.10 4.22  Hemoglobin Latest Range: 12.0-15.0 g/dL 4.8 (LL) 6.1 (LL) 5.3 (LL) 6.1 (LL) 6.2 (LL) 6.6 (LL) 7.8 (L) 9.2 (L) 9.6 (L)  HCT Latest Range: 36.0-46.0 % 18.2 (L) 18.0 (L) 19.6 (L) 21.6 (L) 22.0 (L) 22.6 (L) 26.6 (L)  30.7 (L) 31.9 (L)  MCV Latest Range: 78.0-100.0 fL 65.7 (L)  67.1 (L) 69.7 (L) 69.6 (L) 72.0 (L) 73.3 (L) 74.9 (L) 75.6 (L)  MCH Latest Range: 26.0-34.0 pg 17.3 (L)  18.2 (L) 19.7 (L) 19.6 (L) 21.0 (L) 21.5 (L) 22.4 (L) 22.7 (L)  MCHC Latest Range: 30.0-36.0 g/dL 64.4 (L)  03.4 (L) 74.2 (L) 28.2 (L) 29.2 (L) 29.3 (L) 30.0 30.1  RDW Latest Range: 11.5-15.5 % 19.9 (H)  20.5 (H) 20.8 (H) 20.8 (  H) 22.4 (H) 22.5 (H) 23.1 (H) 23.3 (H)  Platelets Latest Range: 150-400 K/uL 344  288 282 274 264 284 269 279  Neutrophils Relative Latest Range: 43-77 %   85 (H)        Lymphocytes Relative Latest Range: 12-46 %   10 (L)        Monocytes Relative Latest Range: 3-12 %   5        Eosinophils Relative Latest Range: 0-5 %   0        Basophils Relative Latest Range: 0-1 %   0        NEUT# Latest Range: 1.7-7.7 K/uL   9.5 (H)        Lymphocytes Absolute Latest Range: 0.7-4.0 K/uL   1.1        Monocytes Absolute Latest Range: 0.1-1.0 K/uL   0.6        Eosinophils Absolute Latest Range: 0.0-0.5 10e3/uL   0.0        Basophils Absolute Latest Range: 0.0-0.1 K/uL   0.0        RBC Morphology No range found   POLYCHROMASIA PRESENT        RBC. Latest Range: 3.87-5.11 MIL/uL   2.92 (L)        Retic Ct Pct Latest Range: 0.4-3.1 %   1.3        Retic Count, Manual Latest Range: 19.0-186.0 K/uL   38.0         Results for LOREL, LEMBO (MRN [id number]) as of 12/29/2011 10:22  Ref. Range 12/22/2011 17:00 12/25/2011 01:20 12/26/2011 09:43 12/28/2011 06:00 12/29/2011 06:29  Prothrombin Time Latest Range: 11.6-15.2 seconds 23.8 (H) 16.5 (H) 14.3 15.5 (H) 15.4 (H)  INR Latest Range: 0.00-1.49  2.24 (H) 1.37 1.13 1.25 1.24   Results for MAELEE, HOOT (MRN [id number]) as of 12/29/2011 10:22  Ref. Range 12/23/2011 03:30  Influenza A By PCR Latest Range: NEGATIVE  NEGATIVE  Influenza B By PCR Latest Range: NEGATIVE  NEGATIVE  H1N1 flu by pcr Latest Range: NOT DETECTED  NOT DETECTED   Results for LORIE, CLECKLEY (MRN  [id number]) as of 12/29/2011 10:22  Ref. Range 12/23/2011 03:48  Color, Urine Latest Range: YELLOW  YELLOW  APPearance Latest Range: CLEAR  CLOUDY (A)  Specific Gravity, Urine Latest Range: 1.005-1.030  1.018  pH Latest Range: 5.0-8.0  6.0  Glucose Latest Range: NEGATIVE mg/dL NEGATIVE  Bilirubin Urine Latest Range: NEGATIVE  NEGATIVE  Ketones, ur Latest Range: NEGATIVE mg/dL NEGATIVE  Protein Latest Range: NEGATIVE mg/dL NEGATIVE  Urobilinogen, UA Latest Range: 0.0-1.0 mg/dL 1.0  Nitrite Latest Range: NEGATIVE  POSITIVE (A)  Leukocytes, UA Latest Range: NEGATIVE  MODERATE (A)  Hgb urine dipstick Latest Range: NEGATIVE  NEGATIVE  WBC, UA Latest Range: <3 WBC/hpf 7-10  RBC / HPF Latest Range: <3 RBC/hpf 0-2  Squamous Epithelial / LPF Latest Range: RARE  RARE  Bacteria, UA Latest Range: RARE  MANY (A)     Ref. Range 12/23/2011 03:48  Organism ID, Bacteria No range found ESCHERICHIA COLI   FOBT negative 12/22/11   Results for orders placed during the hospital encounter of 12/22/11 (from the past 24 hour(s))  POTASSIUM     Status: Normal   Collection Time   12/28/11  5:37 PM      Component Value Range   Potassium 4.8  3.5 - 5.1 mEq/L  BASIC METABOLIC PANEL     Status: Abnormal   Collection Time   12/29/11  6:29  AM      Component Value Range   Sodium 138  135 - 145 mEq/L   Potassium 4.5  3.5 - 5.1 mEq/L   Chloride 103  96 - 112 mEq/L   CO2 28  19 - 32 mEq/L   Glucose, Bld 116 (*) 70 - 99 mg/dL   BUN 16  6 - 23 mg/dL   Creatinine, Ser 0.86  0.50 - 1.10 mg/dL   Calcium 9.0  8.4 - 57.8 mg/dL   GFR calc non Af Amer 89 (*) >90 mL/min   GFR calc Af Amer >90  >90 mL/min  CBC     Status: Abnormal   Collection Time   12/29/11  6:29 AM      Component Value Range   WBC 8.8  4.0 - 10.5 K/uL   RBC 4.34  3.87 - 5.11 MIL/uL   Hemoglobin 9.6 (*) 12.0 - 15.0 g/dL   HCT 46.9 (*) 62.9 - 52.8 %   MCV 78.3  78.0 - 100.0 fL   MCH 22.1 (*) 26.0 - 34.0 pg   MCHC 28.2 (*) 30.0 - 36.0 g/dL   RDW  41.3 (*) 24.4 - 15.5 %   Platelets 324  150 - 400 K/uL  PROTIME-INR     Status: Abnormal   Collection Time   12/29/11  6:29 AM      Component Value Range   Prothrombin Time 15.4 (*) 11.6 - 15.2 seconds   INR 1.24  0.00 - 1.49   Signed: Annett Gula 12/29/2011, 12:34 PM   Time Spent on Discharge: 90 minutes  Services Ordered on Discharge: PT/OT Equipment Ordered on Discharge: 3:1 Bariatric

## 2011-12-30 ENCOUNTER — Other Ambulatory Visit: Payer: PRIVATE HEALTH INSURANCE | Admitting: Lab

## 2011-12-30 ENCOUNTER — Ambulatory Visit: Payer: PRIVATE HEALTH INSURANCE | Admitting: Oncology

## 2012-01-01 ENCOUNTER — Telehealth: Payer: Self-pay | Admitting: *Deleted

## 2012-01-01 ENCOUNTER — Other Ambulatory Visit: Payer: Self-pay | Admitting: Oncology

## 2012-01-01 NOTE — Telephone Encounter (Signed)
[facility] calls and states PT= 24.5                                     INR= 2.0 Please advise, call wendy, alternating between 2mg  and 4mg , took 2mg  at lunch today, please advise new orders and next PT & INR

## 2012-01-02 NOTE — Telephone Encounter (Signed)
Please call those concerned and inform them that the patient is to continue the coumadin as prescribed at discharge (alternating dose).  Thank You.

## 2012-01-02 NOTE — Telephone Encounter (Signed)
I would like her re-enrolled with Dr. Alexandria Lodge.  She can be seen in 1 month.

## 2012-01-02 NOTE — Telephone Encounter (Signed)
When would you want the PT and INR checked next?

## 2012-01-05 ENCOUNTER — Telehealth: Payer: Self-pay | Admitting: Oncology

## 2012-01-05 NOTE — Telephone Encounter (Signed)
S/w pt re appt for 02/02/12

## 2012-01-06 NOTE — Telephone Encounter (Signed)
i have called the home health nurse to inform her of need to see dr groce and will forward to [place]. For appt

## 2012-01-07 ENCOUNTER — Ambulatory Visit: Payer: PRIVATE HEALTH INSURANCE | Admitting: Internal Medicine

## 2012-01-07 ENCOUNTER — Encounter: Payer: PRIVATE HEALTH INSURANCE | Admitting: Internal Medicine

## 2012-01-12 ENCOUNTER — Other Ambulatory Visit: Payer: Self-pay | Admitting: *Deleted

## 2012-01-12 DIAGNOSIS — E785 Hyperlipidemia, unspecified: Secondary | ICD-10-CM

## 2012-01-12 MED ORDER — PRAVASTATIN SODIUM 40 MG PO TABS: 40.0000 mg | ORAL_TABLET | Freq: Every day | ORAL | Status: DC

## 2012-01-19 ENCOUNTER — Other Ambulatory Visit: Payer: Self-pay | Admitting: Gastroenterology

## 2012-01-29 ENCOUNTER — Encounter: Payer: PRIVATE HEALTH INSURANCE | Admitting: Internal Medicine

## 2012-01-29 ENCOUNTER — Ambulatory Visit: Payer: PRIVATE HEALTH INSURANCE | Admitting: Internal Medicine

## 2012-01-30 ENCOUNTER — Ambulatory Visit (INDEPENDENT_AMBULATORY_CARE_PROVIDER_SITE_OTHER): Payer: PRIVATE HEALTH INSURANCE | Admitting: Internal Medicine

## 2012-01-30 ENCOUNTER — Encounter: Payer: Self-pay | Admitting: Internal Medicine

## 2012-01-30 ENCOUNTER — Encounter: Payer: PRIVATE HEALTH INSURANCE | Admitting: Internal Medicine

## 2012-01-30 VITALS — BP 133/74 | HR 77 | Temp 99.4°F | Wt 293.6 lb

## 2012-01-30 DIAGNOSIS — I82419 Acute embolism and thrombosis of unspecified femoral vein: Secondary | ICD-10-CM

## 2012-01-30 DIAGNOSIS — F419 Anxiety disorder, unspecified: Secondary | ICD-10-CM

## 2012-01-30 DIAGNOSIS — G8929 Other chronic pain: Secondary | ICD-10-CM

## 2012-01-30 DIAGNOSIS — E785 Hyperlipidemia, unspecified: Secondary | ICD-10-CM | POA: Insufficient documentation

## 2012-01-30 DIAGNOSIS — F329 Major depressive disorder, single episode, unspecified: Secondary | ICD-10-CM

## 2012-01-30 DIAGNOSIS — I824Y9 Acute embolism and thrombosis of unspecified deep veins of unspecified proximal lower extremity: Secondary | ICD-10-CM

## 2012-01-30 DIAGNOSIS — M797 Fibromyalgia: Secondary | ICD-10-CM

## 2012-01-30 DIAGNOSIS — S62102A Fracture of unspecified carpal bone, left wrist, initial encounter for closed fracture: Secondary | ICD-10-CM | POA: Insufficient documentation

## 2012-01-30 DIAGNOSIS — K31819 Angiodysplasia of stomach and duodenum without bleeding: Secondary | ICD-10-CM

## 2012-01-30 DIAGNOSIS — M1712 Unilateral primary osteoarthritis, left knee: Secondary | ICD-10-CM | POA: Insufficient documentation

## 2012-01-30 DIAGNOSIS — IMO0001 Reserved for inherently not codable concepts without codable children: Secondary | ICD-10-CM

## 2012-01-30 DIAGNOSIS — D62 Acute posthemorrhagic anemia: Secondary | ICD-10-CM

## 2012-01-30 DIAGNOSIS — G4730 Sleep apnea, unspecified: Secondary | ICD-10-CM

## 2012-01-30 DIAGNOSIS — F17200 Nicotine dependence, unspecified, uncomplicated: Secondary | ICD-10-CM

## 2012-01-30 DIAGNOSIS — Z720 Tobacco use: Secondary | ICD-10-CM

## 2012-01-30 DIAGNOSIS — Z7901 Long term (current) use of anticoagulants: Secondary | ICD-10-CM

## 2012-01-30 DIAGNOSIS — Z0000 Encounter for general adult medical examination without abnormal findings: Secondary | ICD-10-CM | POA: Insufficient documentation

## 2012-01-30 DIAGNOSIS — M549 Dorsalgia, unspecified: Secondary | ICD-10-CM

## 2012-01-30 DIAGNOSIS — I872 Venous insufficiency (chronic) (peripheral): Secondary | ICD-10-CM

## 2012-01-30 DIAGNOSIS — K219 Gastro-esophageal reflux disease without esophagitis: Secondary | ICD-10-CM

## 2012-01-30 DIAGNOSIS — F411 Generalized anxiety disorder: Secondary | ICD-10-CM

## 2012-01-30 DIAGNOSIS — I82409 Acute embolism and thrombosis of unspecified deep veins of unspecified lower extremity: Secondary | ICD-10-CM

## 2012-01-30 HISTORY — DX: Fibromyalgia: M79.7

## 2012-01-30 HISTORY — DX: Anxiety disorder, unspecified: F41.9

## 2012-01-30 HISTORY — DX: Venous insufficiency (chronic) (peripheral): I87.2

## 2012-01-30 HISTORY — DX: Gastro-esophageal reflux disease without esophagitis: K21.9

## 2012-01-30 HISTORY — DX: Unilateral primary osteoarthritis, left knee: M17.12

## 2012-01-30 HISTORY — DX: Hyperlipidemia, unspecified: E78.5

## 2012-01-30 LAB — CBC
HCT: 36.1 % (ref 36.0–46.0)
Hemoglobin: 10.7 g/dL — ABNORMAL LOW (ref 12.0–15.0)
MCH: 21.8 pg — ABNORMAL LOW (ref 26.0–34.0)
MCHC: 29.6 g/dL — ABNORMAL LOW (ref 30.0–36.0)
MCV: 73.5 fL — ABNORMAL LOW (ref 78.0–100.0)

## 2012-01-30 LAB — POCT INR: INR: 1.2

## 2012-01-30 NOTE — Assessment & Plan Note (Signed)
She's had no signs or symptoms of recurrent bleeding. A CBC without differential was drawn to assess the stability of her anemia. 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 The results of the CBC are pending at this time.

## 2012-01-30 NOTE — Assessment & Plan Note (Signed)
She states that she is slowly decreasing the amount of cigarettes she smokes each day. She desires to quit smoking altogether. The health benefits of smoking cessation were discussed and she understands. She was not interested in any pharmacologic therapy to assist in her attempts at smoking cessation. She was encouraged to continue to decrease, and ultimately stop, her smoking. She was asked to call the clinic should she change her mind on the need for any pharmacologic therapy.

## 2012-01-30 NOTE — Assessment & Plan Note (Signed)
She states that her depression is well controlled on the sertraline. We will therefore continue at the current dose.

## 2012-01-30 NOTE — Assessment & Plan Note (Signed)
She states that she has been evaluated for a total knee arthroplasty on the left but was found not to be a surgical candidate. She is unable to ambulate, and with her acute left wrist fracture, is unable to propel herself in a standard wheelchair. We will therefore initiate the process for a motorized wheelchair so that she can get out and about. We will also continue the chronic hydrocodone to manage her left knee pain.

## 2012-01-30 NOTE — Patient Instructions (Signed)
It was nice to meet you.  I look forward to taking care of you for years to come.  1) We checked some blood today.  If I have any concerns I will notify you.  2) I will set up referrals with Dr. Alexandria Lodge for your INR, an eye doctor, and a mammogram.  3) We will start the process for an electric wheelchair.  4) I will see you in 3 months to follow-up on your status.  Keep taking your medications as prescribed.

## 2012-01-30 NOTE — Assessment & Plan Note (Signed)
She's had no overt signs or symptoms consistent with recurrent bleeding. She is scheduled next week to be endoscoped by Dr. 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 We will continue the pantoprazole.

## 2012-01-30 NOTE — Assessment & Plan Note (Addendum)
Her symptoms are well controlled on the sertraline and lorazepam. We will continue this therapy at the current doses.  She states she is on the quetiapine for insomnia. We will also continue this therapy as it is effective.

## 2012-01-30 NOTE — Assessment & Plan Note (Signed)
Her symptoms are improved on the lamotrigine. We will therefore continue with the current dose.

## 2012-01-30 NOTE — Progress Notes (Signed)
  Subjective:    Patient ID: [name], female    DOB: 1945/03/25, 67 y.o.   MRN: [id number]  HPI  Please see the A&P for the status of the pt's chronic medical problems.  Review of Systems  Constitutional: Negative for activity change, appetite change, fatigue and unexpected weight change.  HENT: Positive for ear pain. Negative for hearing loss and ear discharge.   Respiratory: Negative for chest tightness, shortness of breath and wheezing.   Cardiovascular: Positive for leg swelling. Negative for chest pain.  Gastrointestinal: Positive for abdominal pain. Negative for constipation, blood in stool and abdominal distention.  Musculoskeletal: Positive for back pain, arthralgias and gait problem. Negative for myalgias and joint swelling.  Skin: Negative for rash and wound.  Neurological: Negative for seizures, syncope, facial asymmetry, weakness, light-headedness, numbness and headaches.  Psychiatric/Behavioral: Positive for dysphoric mood. The patient is nervous/anxious.       Objective:   Physical Exam  Nursing note and vitals reviewed. Constitutional: She is oriented to person, place, and time. She appears well-developed and well-nourished. No distress.  HENT:  Head: Normocephalic and atraumatic.  Right Ear: External ear normal.  Mouth/Throat: Oropharynx is clear and moist. No oropharyngeal exudate.  Eyes: Conjunctivae normal are normal. No scleral icterus.  Neck: Normal range of motion. Neck supple. No JVD present. No tracheal deviation present. No thyromegaly present.  Cardiovascular: Normal rate, regular rhythm and normal heart sounds.  Exam reveals no gallop and no friction rub.   No murmur heard. Pulmonary/Chest: Effort normal and breath sounds normal. No stridor. No respiratory distress. She has no wheezes. She has no rales.  Abdominal: Soft. Bowel sounds are normal. She exhibits no distension and no mass. There is tenderness. There is no rebound and no guarding.    Musculoskeletal: She exhibits edema.  Lymphadenopathy:    She has no cervical adenopathy.  Neurological: She is alert and oriented to person, place, and time.  Skin: Skin is warm and dry. No rash noted. She is not diaphoretic. No erythema.  Psychiatric: She has a normal mood and affect. Her behavior is normal. Judgment and thought content normal.      Assessment & Plan:   Please see problem oriented charting.

## 2012-01-30 NOTE — Assessment & Plan Note (Signed)
She continues to have chronic low back pain despite undergoing a lumbar decompression for spinal stenosis. She feels that the pain is reasonably well-controlled on her hydrocodone. She also has what she describes is neuropathic pain and that is well-controlled on the gabapentin. This also will be continued.

## 2012-01-30 NOTE — Assessment & Plan Note (Signed)
Symptoms are extremely well controlled on the pantoprazole. We will therefore continue the PPI.

## 2012-01-30 NOTE — Assessment & Plan Note (Signed)
She has not been compliant with her nocturnal nasal CPAP. She states that it was ineffective and she is not interested in therapy. We will discuss the benefits of retrying this therapy at a future appointment.

## 2012-01-30 NOTE — Assessment & Plan Note (Signed)
She is due for a mammogram and a referral will be made. She is only able to attend appointments in the afternoons. We will therefore request an afternoon appointment for her mammography.

## 2012-01-30 NOTE — Assessment & Plan Note (Signed)
She is on chronic Coumadin therapy for recurrent thrombosis. This has been held pending her left wrist fracture surgery next week as well as her endoscopy scheduled for next week. Her INR today was 1.2. Her goal is between 2 and 3. We will refer her to Dr. Alexandria Lodge to assist in the management of her anticoagulation.

## 2012-02-02 ENCOUNTER — Other Ambulatory Visit: Payer: PRIVATE HEALTH INSURANCE

## 2012-02-02 ENCOUNTER — Ambulatory Visit: Payer: Medicaid Other

## 2012-02-02 ENCOUNTER — Encounter: Payer: PRIVATE HEALTH INSURANCE | Admitting: Oncology

## 2012-02-02 NOTE — Progress Notes (Signed)
No Show for lab/visit/Coumadin clinic today. POF to scheduler to R/S appts. This encounter was created in error - please disregard.

## 2012-02-03 ENCOUNTER — Telehealth: Payer: Self-pay | Admitting: Oncology

## 2012-02-03 NOTE — Telephone Encounter (Signed)
s.w. pt and gv appt schedule for Feb...Marland Kitchenpt ok and aware

## 2012-02-05 NOTE — Progress Notes (Signed)
Patient ID: [name], female   DOB: 06/11/1945, 66 y.o.   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  She is currently on FeSO4 replacement and has GI follow-up arranged. 

## 2012-02-06 ENCOUNTER — Encounter (HOSPITAL_COMMUNITY): Payer: Self-pay | Admitting: Gastroenterology

## 2012-02-06 ENCOUNTER — Encounter (HOSPITAL_COMMUNITY)
Admission: RE | Disposition: A | Discharge status: Skilled Nursing Facility | Payer: Self-pay | Source: Ambulatory Visit | Attending: Gastroenterology

## 2012-02-06 ENCOUNTER — Ambulatory Visit (HOSPITAL_COMMUNITY)
Admission: RE | Admit: 2012-02-06 | Discharge: 2012-02-06 | Disposition: A | Discharge status: Skilled Nursing Facility | Payer: PRIVATE HEALTH INSURANCE | Source: Ambulatory Visit | Attending: Gastroenterology | Admitting: Gastroenterology

## 2012-02-06 DIAGNOSIS — D509 Iron deficiency anemia, unspecified: Secondary | ICD-10-CM | POA: Insufficient documentation

## 2012-02-06 DIAGNOSIS — K31819 Angiodysplasia of stomach and duodenum without bleeding: Secondary | ICD-10-CM | POA: Insufficient documentation

## 2012-02-06 HISTORY — PX: ESOPHAGOGASTRODUODENOSCOPY: SHX5428

## 2012-02-06 HISTORY — PX: HOT HEMOSTASIS: SHX5433

## 2012-02-06 SURGERY — EGD, WITH ARGON PLASMA COAGULATION
Anesthesia: Moderate Sedation

## 2012-02-06 MED ORDER — SODIUM CHLORIDE 0.9 % IV SOLN
INTRAVENOUS | Status: DC
  Administered 2012-02-06: 500 mL via INTRAVENOUS

## 2012-02-06 MED ORDER — MIDAZOLAM HCL 10 MG/2ML IJ SOLN
INTRAMUSCULAR | Status: DC | PRN
  Administered 2012-02-06 (×4): 2.5 mg via INTRAVENOUS

## 2012-02-06 MED ORDER — MIDAZOLAM HCL 10 MG/2ML IJ SOLN
INTRAMUSCULAR | Status: AC
  Filled 2012-02-06: qty 2

## 2012-02-06 MED ORDER — BUTAMBEN-TETRACAINE-BENZOCAINE 2-2-14 % EX AERO
INHALATION_SPRAY | CUTANEOUS | Status: DC | PRN
  Administered 2012-02-06: 2 via TOPICAL

## 2012-02-06 MED ORDER — FENTANYL CITRATE 0.05 MG/ML IJ SOLN
INTRAMUSCULAR | Status: DC | PRN
  Administered 2012-02-06 (×4): 25 ug via INTRAVENOUS

## 2012-02-06 MED ORDER — FENTANYL CITRATE 0.05 MG/ML IJ SOLN
INTRAMUSCULAR | Status: AC
  Filled 2012-02-06: qty 2

## 2012-02-06 NOTE — Interval H&P Note (Signed)
History and Physical Interval Note:  02/06/2012 10:10 AM  [name]  has presented today for surgery, with the diagnosis of avm  The various methods of treatment have been discussed with the patient and family. After consideration of risks, benefits and other options for treatment, the patient has consented to  Procedure(s) (LRB) with comments: HOT HEMOSTASIS (ARGON PLASMA COAGULATION/BICAP) (N/A) ESOPHAGOGASTRODUODENOSCOPY (EGD) (N/A) as a surgical intervention .  The patient's history has been reviewed, patient examined, no change in status, stable for surgery.  I have reviewed the patient's chart and labs.  Questions were answered to the patient's satisfaction.     Tisheena Maguire D

## 2012-02-06 NOTE — Op Note (Signed)
[facility] [address] [place], [address]   OPERATIVE PROCEDURE REPORT  PATIENT: [name], [name]  MR#: [id number] BIRTHDATE: 02/27/1945  GENDER: Female ENDOSCOPIST: Jeani Hawking, MD ASSISTANT:   Cathlean Marseilles, RN, CGRN and Oletha Blend, technician PROCEDURE DATE: 02/06/2012 PROCEDURE:   EGD w/ ablation ASA CLASS:   Class IV INDICATIONS:GAVE. MEDICATIONS: Versed 10 mg IV and Fentanyl 100 mcg IV TOPICAL ANESTHETIC:   Cetacaine Spray  DESCRIPTION OF PROCEDURE:   After the risks benefits and alternatives of the procedure were thoroughly explained, informed consent was obtained.  The     endoscope was introduced through the mouth  and advanced to the second portion of the duodenum Without limitations.      The instrument was slowly withdrawn as the mucosa was fully examined.    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  The area was treated with APC.  Some minor bleeding was induced, but this was arrested with continued ablation of the area.  No other abnormalities noted.   Retroflexed views revealed no abnormalities. The scope was then withdrawn from the patient and the procedure terminated.  COMPLICATIONS: There were no complications.  IMPRESSION: 1) GAVE.  RECOMMENDATIONS: 1) Repeat EGD with APC in one month.   _______________________________ eSigned:  Jeani Hawking, MD 02/06/2012 12:11 PM

## 2012-02-06 NOTE — H&P (View-Only) (Signed)
Patient ID: [name], female   DOB: 02-Nov-1945, 67 y.o.   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  She is currently on FeSO4 replacement and has GI follow-up arranged.

## 2012-02-06 NOTE — Discharge Instructions (Signed)
Moderate Sedation, Adult Moderate sedation is given to help you relax or even sleep through a procedure. You may remain sleepy, be clumsy, or have poor balance for several hours following this procedure. Arrange for a responsible adult, family member, or friend to take you home. A responsible adult should stay with you for at least 24 hours or until the medicines have worn off.  Do not participate in any activities where you could become injured for the next 24 hours, or until you feel normal again. Do not:  Drive.  Swim.  Ride a bicycle.  Operate heavy machinery.  Cook.  Use power tools.  Climb ladders.  Work at [employer].  Do not make important decisions or sign legal documents until you are improved.  Vomiting may occur if you eat too soon. When you can drink without vomiting, try water, juice, or soup. Try solid foods if you feel little or no nausea.  Only take over-the-counter or prescription medications for pain, discomfort, or fever as directed by your caregiver.If pain medications have been prescribed for you, ask your caregiver how soon it is safe to take them.  Make sure you and your family fully understands everything about the medication given to you. Make sure you understand what side effects may occur.  You should not drink alcohol, take sleeping pills, or medications that cause drowsiness for at least 24 hours.  If you smoke, do not smoke alone.  If you are feeling better, you may resume normal activities 24 hours after receiving sedation.  Keep all appointments as scheduled. Follow all instructions.  Ask questions if you do not understand. SEEK MEDICAL CARE IF:   Your skin is pale or bluish in color.  You continue to feel sick to your stomach (nauseous) or throw up (vomit).  Your pain is getting worse and not helped by medication.  You have bleeding or swelling.  You are still sleepy or feeling clumsy after 24 hours. SEEK IMMEDIATE MEDICAL CARE IF:    You develop a rash.  You have difficulty breathing.  You develop any type of allergic problem.  You have a fever. Document Released: 10/01/2000 Document Revised: 03/31/2011 Document Reviewed: 02/22/2007 [facility] Patient Information 2013 [place], [place].   Gastrointestinal Endoscopy Care After Refer to this sheet in the next few weeks. These instructions provide you with information on caring for yourself after your procedure. Your caregiver may also give you more specific instructions. Your treatment has been planned according to current medical practices, but problems sometimes occur. Call your caregiver if you have any problems or questions after your procedure. HOME CARE INSTRUCTIONS  If you were given medicine to help you relax (sedative), do not drive, operate machinery, or sign important documents for 24 hours.  Avoid alcohol and hot or warm beverages for the first 24 hours after the procedure.  Only take over-the-counter or prescription medicines for pain, discomfort, or fever as directed by your caregiver. You may resume taking your normal medicines unless your caregiver tells you otherwise. Ask your caregiver when you may resume taking medicines that may cause bleeding, such as aspirin, clopidogrel, or warfarin.  You may return to your normal diet and activities on the day after your procedure, or as directed by your caregiver. Walking may help to reduce any bloated feeling in your abdomen.  Drink enough fluids to keep your urine clear or pale yellow.  You may gargle with salt water if you have a sore throat. SEEK IMMEDIATE MEDICAL CARE IF:  You  have severe nausea or vomiting.  You have severe abdominal pain, abdominal cramps that last longer than 6 hours, or abdominal swelling (distention).  You have severe shoulder or back pain.  You have trouble swallowing.  You have shortness of breath, your breathing is shallow, or you are breathing faster than normal.  You  have a fever or a rapid heartbeat.  You vomit blood or material that looks like coffee grounds.  You have bloody, black, or tarry stools. MAKE SURE YOU:  Understand these instructions.  Will watch your condition.  Will get help right away if you are not doing well or get worse. Document Released: 08/21/2003 Document Revised: 07/08/2011 Document Reviewed: 04/08/2011 [facility] Patient Information 2013 [place], [place].

## 2012-02-09 ENCOUNTER — Encounter (HOSPITAL_COMMUNITY): Payer: Self-pay

## 2012-02-09 ENCOUNTER — Ambulatory Visit (HOSPITAL_COMMUNITY): Admit: 2012-02-09 | Payer: Self-pay | Admitting: Gastroenterology

## 2012-02-09 SURGERY — EGD (ESOPHAGOGASTRODUODENOSCOPY)
Anesthesia: Moderate Sedation

## 2012-02-10 ENCOUNTER — Encounter (HOSPITAL_COMMUNITY): Payer: Self-pay | Admitting: Gastroenterology

## 2012-02-19 ENCOUNTER — Other Ambulatory Visit: Payer: Self-pay | Admitting: *Deleted

## 2012-02-19 DIAGNOSIS — I82409 Acute embolism and thrombosis of unspecified deep veins of unspecified lower extremity: Secondary | ICD-10-CM

## 2012-02-19 MED ORDER — WARFARIN SODIUM 4 MG PO TABS: 2.0000 mg | ORAL_TABLET | Freq: Every day | ORAL | Status: DC

## 2012-02-24 ENCOUNTER — Telehealth: Payer: Self-pay | Admitting: *Deleted

## 2012-02-24 NOTE — Telephone Encounter (Signed)
Please call at the [facility] RN request denise aldridge [phone], in reference to pt's pcs

## 2012-02-26 NOTE — Telephone Encounter (Signed)
Spoke w/ shanag., pcs is resolved

## 2012-02-26 NOTE — Telephone Encounter (Signed)
PCS faxed 02/17/12

## 2012-03-01 ENCOUNTER — Ambulatory Visit: Payer: PRIVATE HEALTH INSURANCE | Admitting: Oncology

## 2012-03-01 ENCOUNTER — Other Ambulatory Visit: Payer: PRIVATE HEALTH INSURANCE | Admitting: Lab

## 2012-03-01 ENCOUNTER — Ambulatory Visit: Payer: PRIVATE HEALTH INSURANCE

## 2012-03-02 ENCOUNTER — Other Ambulatory Visit: Payer: Self-pay | Admitting: Oncology

## 2012-03-04 ENCOUNTER — Telehealth: Payer: Self-pay | Admitting: Oncology

## 2012-03-04 NOTE — Telephone Encounter (Signed)
per 2.11.14 pof Do not r/s pt unless pt calls to r/s...pt keep no show for visits

## 2012-03-23 ENCOUNTER — Encounter: Payer: Self-pay | Admitting: Pharmacist

## 2012-03-23 ENCOUNTER — Other Ambulatory Visit: Payer: Self-pay | Admitting: *Deleted

## 2012-03-23 NOTE — Progress Notes (Signed)
I called pt to have her come to [facility] Coumadin clinic.  We have not seen pt since 11/14/11. Pt states she had her INR checked 1 month ago at the [facility] where she sees Dr. Doneen Poisson.  However, I am only able to find an INR from 01/30/12 which was 1.2.  Pt remembered that her INR was 1.2 & she was told that this INR was "okay." When asked if she is willing to come here to have her INR checked, she stated that she cannot walk & her wrist is broken.  She has an aid that helps her until 12 noon every day except Mondays. When I asked if she is still transported by [facility] she said she is as long as the weather is ok. I finally was able to get pt to agree to schedule lab/Coumadin clinic visit for 03/31/12 & I called [facility].  They will pick her up at her house at 1 pm on 03/31/12. If pt has her INR checked at [facility] this Friday (3/7) then she will call & let us know. Marily Lente, Pharm.D.

## 2012-03-24 ENCOUNTER — Other Ambulatory Visit: Payer: Self-pay | Admitting: Gastroenterology

## 2012-03-24 MED ORDER — FERROUS SULFATE 325 (65 FE) MG PO TBEC: 325.0000 mg | DELAYED_RELEASE_TABLET | Freq: Three times a day (TID) | ORAL | Status: DC

## 2012-03-26 ENCOUNTER — Encounter: Payer: PRIVATE HEALTH INSURANCE | Admitting: Internal Medicine

## 2012-03-29 ENCOUNTER — Other Ambulatory Visit: Payer: Self-pay

## 2012-03-29 NOTE — Progress Notes (Signed)
Reminded patient of lab appointment and appointment with Coumadin Clinic on 03/31/12 @ 2:00pm; expressed to patient the importance of her keeping these appointments in order for physician to adjust coumadin dosage; assured me that she would be at appointment 03/31/12 and verbalized understanding.

## 2012-03-31 ENCOUNTER — Other Ambulatory Visit (HOSPITAL_BASED_OUTPATIENT_CLINIC_OR_DEPARTMENT_OTHER): Payer: PRIVATE HEALTH INSURANCE | Admitting: Lab

## 2012-03-31 ENCOUNTER — Ambulatory Visit (HOSPITAL_BASED_OUTPATIENT_CLINIC_OR_DEPARTMENT_OTHER): Payer: PRIVATE HEALTH INSURANCE | Admitting: Pharmacist

## 2012-03-31 LAB — CBC WITH DIFFERENTIAL/PLATELET
BASO%: 0.7 % (ref 0.0–2.0)
EOS%: 2.6 % (ref 0.0–7.0)
HCT: 40 % (ref 34.8–46.6)
MCH: 24 pg — ABNORMAL LOW (ref 25.1–34.0)
MCHC: 31.2 g/dL — ABNORMAL LOW (ref 31.5–36.0)
MCV: 77 fL — ABNORMAL LOW (ref 79.5–101.0)
MONO%: 6.4 % (ref 0.0–14.0)
NEUT%: 71.6 % (ref 38.4–76.8)
RDW: 20.5 % — ABNORMAL HIGH (ref 11.2–14.5)
lymph#: 1.6 10*3/uL (ref 0.9–3.3)

## 2012-03-31 LAB — POCT INR: INR: 1.1

## 2012-03-31 LAB — COMPREHENSIVE METABOLIC PANEL (CC13)
ALT: 7 U/L (ref 0–55)
BUN: 15.1 mg/dL (ref 7.0–26.0)
CO2: 30 mEq/L — ABNORMAL HIGH (ref 22–29)
Calcium: 9.1 mg/dL (ref 8.4–10.4)
Creatinine: 0.7 mg/dL (ref 0.6–1.1)
Glucose: 96 mg/dl (ref 70–99)
Total Bilirubin: 0.22 mg/dL (ref 0.20–1.20)

## 2012-03-31 LAB — PROTIME-INR

## 2012-03-31 NOTE — Progress Notes (Signed)
INR = 1.1 Pt has not taken Coumadin since Sunday 03/28/12 (she took 2 mg that day).  She was instructed by Dr. Jeani Hawking- [facility] (ph [phone]) to hold her Coumadin for EGD Pt reports she had previously been on Coumadin 4 mg/alt 2 mg daily. Pt continues to be non-compliant w/ her Coumadin clinic appts at [facility].   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  Dr Elnoria Howard plans for her last EGD w/ cauterization to be this Fri 04/02/12.   I spoke w/ Dr. Elnoria Howard today over the phone & he confirmed that he discussed w/ pt the plan for holding Coumadin around EGD.  However, we have not been aware of these plans since pt has not been seen in our Coumadin clinic since 11/14/11. I requested that Dr. Elnoria Howard discuss these plans w/ Dr. Gaylyn Rong or our Coumadin clinic if EGD is required again in the future.  Dr Elnoria Howard agreed to communicate w/ [place]. Dr. Elnoria Howard mentioned that there may need to be consideration of pt coming off Coumadin indefinitely in the setting of GAVE & frequent EGD w/ cauterization.  Given the fact that pt is non compliant with her Coumadin, she is likely subtherapeutic most of the time anyway.  This will need to be decided by Dr. Gaylyn Rong ultimately.  Pts last VTE event was PE in 2009. Per Dr. Elnoria Howard, each time she has EGD, he has her hold her Coumadin for 1 week due to bleeding risk.   I also had a discussion w/ pt regarding the importance of her having her INR checked on a regular basis somewhere...whether here or at Internal Medicine's Coumadin clinic.  Pt will resume her alt 4 mg/2 mg dose on 04/09/12 at Dr Haywood Pao recommendation. Pt will return 3/28 for next protime.  I have arranged transportation w/ [employer]. I spent approx 60 minutes coordinating care during today's visit. Ebony Hail, Pharm.D., CPP 03/31/2012@4 :44 PM

## 2012-04-02 ENCOUNTER — Ambulatory Visit (HOSPITAL_COMMUNITY)
Admission: RE | Admit: 2012-04-02 | Discharge: 2012-04-02 | Disposition: A | Discharge status: Home / Self Care | Payer: PRIVATE HEALTH INSURANCE | Source: Ambulatory Visit | Attending: Gastroenterology | Admitting: Gastroenterology

## 2012-04-02 ENCOUNTER — Encounter (HOSPITAL_COMMUNITY): Payer: Self-pay

## 2012-04-02 ENCOUNTER — Encounter (HOSPITAL_COMMUNITY)
Admission: RE | Disposition: A | Discharge status: Home / Self Care | Payer: Self-pay | Source: Ambulatory Visit | Attending: Gastroenterology

## 2012-04-02 DIAGNOSIS — D509 Iron deficiency anemia, unspecified: Secondary | ICD-10-CM | POA: Insufficient documentation

## 2012-04-02 DIAGNOSIS — Z7901 Long term (current) use of anticoagulants: Secondary | ICD-10-CM | POA: Insufficient documentation

## 2012-04-02 DIAGNOSIS — K31819 Angiodysplasia of stomach and duodenum without bleeding: Secondary | ICD-10-CM | POA: Insufficient documentation

## 2012-04-02 HISTORY — PX: ESOPHAGOGASTRODUODENOSCOPY: SHX5428

## 2012-04-02 SURGERY — EGD (ESOPHAGOGASTRODUODENOSCOPY)
Anesthesia: Moderate Sedation

## 2012-04-02 MED ORDER — SODIUM CHLORIDE 0.9 % IV SOLN
INTRAVENOUS | Status: DC
  Administered 2012-04-02: 500 mL via INTRAVENOUS

## 2012-04-02 MED ORDER — MIDAZOLAM HCL 10 MG/2ML IJ SOLN
INTRAMUSCULAR | Status: DC | PRN
  Administered 2012-04-02 (×4): 2 mg via INTRAVENOUS

## 2012-04-02 MED ORDER — MIDAZOLAM HCL 10 MG/2ML IJ SOLN
INTRAMUSCULAR | Status: AC
  Filled 2012-04-02: qty 2

## 2012-04-02 MED ORDER — BUTAMBEN-TETRACAINE-BENZOCAINE 2-2-14 % EX AERO
INHALATION_SPRAY | CUTANEOUS | Status: DC | PRN
  Administered 2012-04-02: 2 via TOPICAL

## 2012-04-02 MED ORDER — DIPHENHYDRAMINE HCL 50 MG/ML IJ SOLN
INTRAMUSCULAR | Status: AC
  Filled 2012-04-02: qty 1

## 2012-04-02 MED ORDER — FENTANYL CITRATE 0.05 MG/ML IJ SOLN
INTRAMUSCULAR | Status: AC
  Filled 2012-04-02: qty 2

## 2012-04-02 MED ORDER — FENTANYL CITRATE 0.05 MG/ML IJ SOLN
INTRAMUSCULAR | Status: DC | PRN
  Administered 2012-04-02 (×3): 25 ug via INTRAVENOUS

## 2012-04-02 NOTE — Op Note (Signed)
[facility] [address] [place], [address]   OPERATIVE PROCEDURE REPORT  PATIENT: [name], [name]  MR#: [id number] BIRTHDATE: [birth year]  GENDER: Female ENDOSCOPIST: Jeani Hawking, MD ASSISTANT:   Harold Barban, RN and Blair Dolphin, technician PROCEDURE DATE: 04/02/2012 PROCEDURE:   EGD w/ ablation ASA CLASS:   Class IV INDICATIONS:GAVE. MEDICATIONS: Versed 8 mg IV and Fentanyl 75 mcg IV TOPICAL ANESTHETIC:  DESCRIPTION OF PROCEDURE:   After the risks benefits and alternatives of the procedure were thoroughly explained, informed consent was obtained.  The EG-[id number] ([id number])  endoscope was introduced through the mouth  and advanced to the second portion of the duodenum Without limitations.      The instrument was slowly withdrawn as the mucosa was fully examined.      STOMACH: GAVE was again identified and treated with APC. Retroflexed views revealed no abnormalities.     The scope was then withdrawn from the patient and the procedure terminated.  COMPLICATIONS: There were no complications. IMPRESSION: 1) GAVE  RECOMMENDATIONS: 1) Repeat EGD with APC in 1 month. 2) Restart coumadin in 1 week and follow up with the coumadin clinic.   _______________________________ eSigned:  Jeani Hawking, MD 04/02/2012 10:39 AM

## 2012-04-02 NOTE — Interval H&P Note (Signed)
History and Physical Interval Note:  04/02/2012 9:48 AM  [name]  has presented today for surgery, with the diagnosis of Gastric antral vascular ectasia   The various methods of treatment have been discussed with the patient and family. After consideration of risks, benefits and other options for treatment, the patient has consented to  Procedure(s) with comments: ESOPHAGOGASTRODUODENOSCOPY (EGD) (N/A) - EGD w/ APC as a surgical intervention .  The patient's history has been reviewed, patient examined, no change in status, stable for surgery.  I have reviewed the patient's chart and labs.  Questions were answered to the patient's satisfaction.     HUNG,PATRICK D

## 2012-04-02 NOTE — H&P (View-Only) (Signed)
INR = 1.1 Pt has not taken Coumadin since Sunday 03/28/12 (she took 2 mg that day).  She was instructed by Dr. Patrick Hung- [facility] (ph #[phone]) to hold her Coumadin for EGD Pt reports she had previously been on Coumadin 4 mg/alt 2 mg daily. Pt continues to be non-compliant w/ her Coumadin clinic appts at [facility].   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  Dr Hung plans for her last EGD w/ cauterization to be this Fri 04/02/12.   I spoke w/ Dr. Hung today over the phone & he confirmed that he discussed w/ pt the plan for holding Coumadin around EGD.  However, we have not been aware of these plans since pt has not been seen in our Coumadin clinic since 11/14/11. I requested that Dr. Hung discuss these plans w/ Dr. Ha or our Coumadin clinic if EGD is required again in the future.  Dr Hung agreed to communicate w/ us. Dr. Hung mentioned that there may need to be consideration of pt coming off Coumadin indefinitely in the setting of GAVE & frequent EGD w/ cauterization.  Given the fact that pt is non compliant with her Coumadin, she is likely subtherapeutic most of the time anyway.  This will need to be decided by Dr. Ha ultimately.  Pts last VTE event was PE in 2009. Per Dr. Hung, each time she has EGD, he has her hold her Coumadin for 1 week due to bleeding risk.   I also had a discussion w/ pt regarding the importance of her having her INR checked on a regular basis somewhere...whether here or at Internal Medicine's Coumadin clinic.  Pt will resume her alt 4 mg/2 mg dose on 04/09/12 at Dr Hung's recommendation. Pt will return 3/28 for next protime.  I have arranged transportation w/ [employer]. I spent approx 60 minutes coordinating care during today's visit. Sunya Humbarger, Pharm.D., CPP 03/31/2012@4:44 PM  

## 2012-04-05 ENCOUNTER — Encounter (HOSPITAL_COMMUNITY): Payer: Self-pay | Admitting: Gastroenterology

## 2012-04-06 ENCOUNTER — Other Ambulatory Visit: Payer: Self-pay | Admitting: *Deleted

## 2012-04-06 MED ORDER — WARFARIN SODIUM 4 MG PO TABS: 2.0000 mg | ORAL_TABLET | Freq: Every day | ORAL | Status: DC

## 2012-04-12 ENCOUNTER — Ambulatory Visit (HOSPITAL_BASED_OUTPATIENT_CLINIC_OR_DEPARTMENT_OTHER): Payer: PRIVATE HEALTH INSURANCE | Admitting: Oncology

## 2012-04-12 ENCOUNTER — Encounter: Payer: Self-pay | Admitting: Oncology

## 2012-04-12 ENCOUNTER — Ambulatory Visit: Payer: PRIVATE HEALTH INSURANCE

## 2012-04-12 ENCOUNTER — Other Ambulatory Visit (HOSPITAL_BASED_OUTPATIENT_CLINIC_OR_DEPARTMENT_OTHER): Payer: PRIVATE HEALTH INSURANCE | Admitting: Lab

## 2012-04-12 VITALS — BP 138/73 | HR 77 | Temp 97.3°F | Resp 22 | Ht 67.0 in | Wt 284.4 lb

## 2012-04-12 DIAGNOSIS — I82409 Acute embolism and thrombosis of unspecified deep veins of unspecified lower extremity: Secondary | ICD-10-CM

## 2012-04-12 DIAGNOSIS — Z86711 Personal history of pulmonary embolism: Secondary | ICD-10-CM

## 2012-04-12 DIAGNOSIS — Z7901 Long term (current) use of anticoagulants: Secondary | ICD-10-CM

## 2012-04-12 LAB — COMPREHENSIVE METABOLIC PANEL (CC13)
Albumin: 3.2 g/dL — ABNORMAL LOW (ref 3.5–5.0)
CO2: 30 mEq/L — ABNORMAL HIGH (ref 22–29)
Calcium: 8.9 mg/dL (ref 8.4–10.4)
Glucose: 101 mg/dl — ABNORMAL HIGH (ref 70–99)
Potassium: 3.1 mEq/L — ABNORMAL LOW (ref 3.5–5.1)
Sodium: 143 mEq/L (ref 136–145)
Total Protein: 6.6 g/dL (ref 6.4–8.3)

## 2012-04-12 LAB — CBC WITH DIFFERENTIAL/PLATELET
BASO%: 0.4 % (ref 0.0–2.0)
Basophils Absolute: 0 10*3/uL (ref 0.0–0.1)
EOS%: 1.9 % (ref 0.0–7.0)
Eosinophils Absolute: 0.1 10*3/uL (ref 0.0–0.5)
HCT: 40.7 % (ref 34.8–46.6)
HGB: 13 g/dL (ref 11.6–15.9)
LYMPH%: 16.8 % (ref 14.0–49.7)
MCH: 24.5 pg — ABNORMAL LOW (ref 25.1–34.0)
MCHC: 32 g/dL (ref 31.5–36.0)
MCV: 76.8 fL — ABNORMAL LOW (ref 79.5–101.0)
MONO#: 0.5 10*3/uL (ref 0.1–0.9)
MONO%: 6.2 % (ref 0.0–14.0)
NEUT#: 5.6 10*3/uL (ref 1.5–6.5)
NEUT%: 74.7 % (ref 38.4–76.8)
Platelets: 252 10*3/uL (ref 145–400)
RBC: 5.3 10*6/uL (ref 3.70–5.45)
RDW: 20.4 % — ABNORMAL HIGH (ref 11.2–14.5)
WBC: 7.5 10*3/uL (ref 3.9–10.3)
lymph#: 1.3 10*3/uL (ref 0.9–3.3)

## 2012-04-12 LAB — PROTIME-INR
INR: 1.1 — ABNORMAL LOW (ref 2.00–3.50)
Protime: 13.2 Seconds (ref 10.6–13.4)

## 2012-04-12 NOTE — Progress Notes (Signed)
[facility]  [phone] [phone]   OFFICE PROGRESS NOTE   Cc:  Rocco Serene, MD  DIAGNOSIS:  Extensive LLE DVT, history of PE.   CURRENT THERAPY: Lifelong anticoagulation with goal INR 2-3.    INTERVAL HISTORY: [name] 67 y.o. female returns for regular follow up by herself. She has missed multiple appointments with me and the Coumadin clinic over the past few months. Underwent a recent EGD by Dr Elnoria Howard. Coumadin was placed on hold; she resumed Coumadin on 03/21/12. She reports that she still has LLE swelling and pain.  The pain is both in her calf in addition to her joints worse in the left side compared to the right side.  This in combination with morbid obesity, she has been mainly wheelchair bound.  She is hoping to get a motorized wheelchair in the near future.  She has fatigue; and needs help from an aid about 4 hours daily.  She has SOB and DOE which she attributed to her obesity.   Patient denies headache, visual changes, confusion, drenching night sweats, palpable lymph node swelling, mucositis, odynophagia, dysphagia, nausea vomiting, jaundice, chest pain, palpitation,  productive cough, gum bleeding, epistaxis, hematemesis, hemoptysis, abdominal pain, abdominal swelling, early satiety, melena, hematochezia, hematuria, skin rash, spontaneous bleeding, joint swelling, heat or cold intolerance, bowel bladder incontinence, back pain, focal motor weakness, paresthesia.  She reports feeling depressed since her 27 year-old son also has severe anxiety and depression, but he refuses to seek help.    Past Medical History  Diagnosis Date  . Chronic obstructive pulmonary disease   . Depression     followed by Dr. Gwyndolyn Kaufman, [facility]  . Gastroesophageal reflux disease   . Hypertension   . Hyperlipidemia   . Interstitial cystitis     followed by Dr. Isabel Caprice  . Irritable bowel syndrome     Colonoscopy 12/2003 and EGD 1997 WNL  . Spinal stenosis      L3-L4, L4-L5 per MRI 12/2004, s/p decompression 02/2007 by Dr. Gerlene Fee, residual chronic low back pain  . 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 Obstructive sleep apnea     Noncompliant with CPAP "ineffective"  . Basal cell carcinoma 09/2001    excised from nose  . Breast cancer     Bilateral s/p mastectomies  . Arthritis   . Anxiety   . 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 Elnoria Howard  . History of blood transfusion     during GAVE associated GI bleed  . Cataract     Left eye pending surgery  . Clotting disorder 2009    LLE DVT and PE (1 month apart)  . Fibromyalgia     Past Surgical History  Procedure Laterality Date  . Cervical discectomy  11/1995 and 05/2002    by Dr. Gerlene Fee  . Abdominal hysterectomy  1976  . Bladder suspension  1993  . Orif ankle fracture  1993  . Lumbar laminectomy/decompression microdiscectomy  02/2007    by Dr. Gerlene Fee  . Carpal tunnel release      LEFT  . Tonsillectomy and adenoidectomy  1953  . Appendectomy    . Dilation and curettage of uterus    . Tubal ligation    . Carpectomy  07/23/2011    Procedure: CARPECTOMY;  Surgeon: Sharma Covert, MD;  Location: [facility] OR;  Service: Orthopedics;  Laterality: Left;  LEFT WRIST PROXIMAL ROW CARPECTOMY  . Esophagogastroduodenoscopy  12/25/2011    Procedure: ESOPHAGOGASTRODUODENOSCOPY (EGD);  Surgeon: Theda Belfast, MD;  Location: [facility] ENDOSCOPY;  Service: Endoscopy;  Laterality: N/A;  . Breast surgery  1992    Bilateral mastectomies for breast cancer  . Rhinoplasty  1993  . Spine surgery    . Hot hemostasis  02/06/2012    Procedure: HOT HEMOSTASIS (ARGON PLASMA COAGULATION/BICAP);  Surgeon: Theda Belfast, MD;  Location: [facility] ENDOSCOPY;  Service: Endoscopy;  Laterality: N/A;  . Esophagogastroduodenoscopy  02/06/2012    Procedure: ESOPHAGOGASTRODUODENOSCOPY (EGD);  Surgeon: Theda Belfast, MD;  Location: [facility] ENDOSCOPY;  Service: Endoscopy;  Laterality: N/A;  .  Esophagogastroduodenoscopy N/A 04/02/2012    Procedure: ESOPHAGOGASTRODUODENOSCOPY (EGD);  Surgeon: Theda Belfast, MD;  Location: [facility] ENDOSCOPY;  Service: Endoscopy;  Laterality: N/A;  EGD w/ APC    Current Outpatient Prescriptions  Medication Sig Dispense Refill  . albuterol (PROVENTIL) (5 MG/ML) 0.5% nebulizer solution Take 0.5 mLs (2.5 mg total) by nebulization every 6 (six) hours as needed for wheezing or shortness of breath.  20 mL  2  . ferrous sulfate 325 (65 FE) MG EC tablet Take 1 tablet (325 mg total) by mouth 3 (three) times daily with meals.  90 tablet  11  . gabapentin (NEURONTIN) 100 MG capsule Take 100 mg by mouth 3 (three) times daily.        Marland Kitchen HYDROcodone-acetaminophen (NORCO) 10-325 MG per tablet Take 1 tablet by mouth every 8 (eight) hours as needed. For pain      . ipratropium (ATROVENT) 0.02 % nebulizer solution Take 2.5 mLs (0.5 mg total) by nebulization 3 (three) times daily.  75 mL  2  . lamoTRIgine (LAMICTAL) 200 MG tablet Take 200 mg by mouth every morning.      Marland Kitchen LORazepam (ATIVAN) 1 MG tablet Take 1-2 mg by mouth 2 (two) times daily. Take one tablet in morning and two tablets by mouth at bedtime      . pantoprazole (PROTONIX) 40 MG tablet Take 1 tablet (40 mg total) by mouth daily.  30 tablet  0  . pravastatin (PRAVACHOL) 40 MG tablet Take 1 tablet (40 mg total) by mouth daily.  30 tablet  0  . QUEtiapine (SEROQUEL) 200 MG tablet Take 400 mg by mouth at bedtime.        . sertraline (ZOLOFT) 100 MG tablet Take 200 mg by mouth daily.        . vitamin C (ASCORBIC ACID) 500 MG tablet Take 1 tablet (500 mg total) by mouth daily.  90 tablet  0  . warfarin (COUMADIN) 4 MG tablet Take 0.5-1 tablets (2-4 mg total) by mouth daily. Takes 1/2 tab(2mg ) every other day alternating with 1 tablet(4mg )OR AS DIRECTED.  30 tablet  0   No current facility-administered medications for this visit.    ALLERGIES:  is allergic to adhesive; codeine; morphine; and sulfonamide  derivatives.  REVIEW OF SYSTEMS:  The rest of the 14-point review of system was negative.   Filed Vitals:   04/12/12 1334  BP: 138/73  Pulse: 77  Temp: 97.3 F (36.3 C)  Resp: 22   Wt Readings from Last 3 Encounters:  04/12/12 284 lb 6.4 oz (129.003 kg)  02/06/12 280 lb (127.007 kg)  02/06/12 280 lb (127.007 kg)   ECOG Performance status: 2  PHYSICAL EXAMINATION:   General: Morbidly obese woman in no acute distress. Eyes: no scleral icterus. ENT: There were no oropharyngeal lesions. Neck was without thyromegaly. Lymphatics: Negative cervical, supraclavicular or axillary  adenopathy. Respiratory: lungs were clear bilaterally without wheezing or crackles. Cardiovascular: Regular rate and rhythm, S1/S2, without murmur, rub or gallop. There was about 1+ nonpitting pedal edema in the left leg.  GI: abdomen was obese but nontender, nondistended, without organomegaly. Muscoloskeletal: no spinal tenderness of palpation of vertebral spine. Skin exam was without echymosis, petichae. She has erythema, wet, slightly fowl odor in the left groin.  CN II-XII were grossly intact.  Patient could not get on the exam table. She was wheelchair bound; gait could not be assessed. Patient was alerted and oriented. Attention was good. Language was appropriate. Mood was depressed; and she was teary. Speech was not pressured. Thought content was not tangential.    LABORATORY/RADIOLOGY DATA:  Lab Results  Component Value Date   WBC 7.5 04/12/2012   HGB 13.0 04/12/2012   HCT 40.7 04/12/2012   PLT 252 04/12/2012   GLUCOSE 101* 04/12/2012   CHOL 170 05/03/2009   TRIG 166* 05/03/2009   HDL 41 05/03/2009   LDLCALC 96 05/03/2009   ALKPHOS 128 04/12/2012   ALT 7 04/12/2012   AST 11 04/12/2012   NA 143 04/12/2012   K 3.1* 04/12/2012   CL 104 04/12/2012   CREATININE 0.7 04/12/2012   BUN 9.3 04/12/2012   CO2 30* 04/12/2012   INR 1.10* 04/12/2012   HGBA1C 6.1* 12/23/2011   MICROALBUR 1.23 08/25/2006     ASSESSMENT AND PLAN:     1. Morbid obesity:  I defer to PCP whether she is a candidate for gastric bypass.  2. Osteoarthritis, with decreased mobility secondary to osteoarthritis of lower back and bilateral knees. She is under the care of [name].  3. Hyperlipidemia: On pravastatin with PCP.  4. Hypertension: off of meds per PCP.  5. History of gastric antral vascular ectasia: she was treated in the past by Dr. Elnoria Howard. She has no evidence of active bleeding now. She is on chronic oral iron.  6. History of smoking with chronic obstructive pulmonary disease (COPD):   Despite multiple attempts, she still smokes now. 7. History of pulmonary embolism (PE) on the right, subsegmental, pulmonary embolism in 2009 with diagnosis of left lower extremity extensive deep venous thrombosis (DVT) in May 2012.  She has high risk features with morbid obesity and limited mobility and history of recurrent thrombosis. She would benefit from lifelong anti coagulation since she doesn't have active bleeding. She has tolerate Coumadin since May 2012 without major problem. Patient has been off Coumadin due to recent EGD. INR is sub therapeutic. I will hold off on changing her dose as it is too soon to do so. She will be seen by the Coumadin clinic on 04/16/12.  8. Depression, anxiety:  He is on Zoloft and Seroquel per her psychiatrist.   9. Follow up:  Coumadin clinic at [facility]; and return with MD in about 6 months.      The length of time of the face-to-face encounter was 15 minutes. More than 50% of time was spent counseling and coordination of care.

## 2012-04-13 ENCOUNTER — Telehealth: Payer: Self-pay | Admitting: Oncology

## 2012-04-15 ENCOUNTER — Other Ambulatory Visit: Payer: Self-pay | Admitting: Oncology

## 2012-04-15 DIAGNOSIS — I82409 Acute embolism and thrombosis of unspecified deep veins of unspecified lower extremity: Secondary | ICD-10-CM

## 2012-04-16 ENCOUNTER — Other Ambulatory Visit: Payer: Self-pay | Admitting: *Deleted

## 2012-04-16 ENCOUNTER — Telehealth: Payer: Self-pay | Admitting: Oncology

## 2012-04-16 ENCOUNTER — Other Ambulatory Visit: Payer: PRIVATE HEALTH INSURANCE | Admitting: Lab

## 2012-04-16 ENCOUNTER — Ambulatory Visit (HOSPITAL_BASED_OUTPATIENT_CLINIC_OR_DEPARTMENT_OTHER): Payer: PRIVATE HEALTH INSURANCE | Admitting: Pharmacist

## 2012-04-16 DIAGNOSIS — I82409 Acute embolism and thrombosis of unspecified deep veins of unspecified lower extremity: Secondary | ICD-10-CM

## 2012-04-16 DIAGNOSIS — I82402 Acute embolism and thrombosis of unspecified deep veins of left lower extremity: Secondary | ICD-10-CM

## 2012-04-16 NOTE — Patient Instructions (Addendum)
Take 4mg  today (3/28), 3/29 and 3/30.  On 3/31, begin 2mg  alternating with 4mg .  Stop coumadin as directed by Dr. Elnoria Howard before and after each procedure.  Recheck INR on 05/19/12; lab at 1pm and coumadin clinic at 1:15pm.

## 2012-04-16 NOTE — Telephone Encounter (Signed)
Gave pt appt for lab and MD on 04/26/12

## 2012-04-16 NOTE — Progress Notes (Signed)
INR below goal today. Pt restarted coumadin on 3/22. She had EGD with APC on 04/02/12. She was instructed to hold her coumadin for 1 week prior to EGD and resume coumadin 1 week after EGD. The next EGD with APC will be ~ 30 days after 04/02/12. I called Dr. Haywood Pao office. The date for the next EGD has not been scheduled at this time. Dr. Haywood Pao office will call [phone] and ask for the coumadin clinic pharmacist to let us know once the next EGD is scheduled. Pt will hold her coumadin for 1 week prior to EGD and resume coumadin 1 week after EGD. No compaints regarding anticoagulation.  She states that she is very tired and in a lot of pain (whole body pain, generalized). Take 4mg  today (3/28), 3/29 and 3/30.  On 3/31, begin 2mg  alternating with 4mg .  Stop coumadin as directed by Dr. Elnoria Howard before and after each procedure.  It is estimated the next EGD will ~ 04/30/12 and that she will be stopping her coumadin ~ 04/23/12 and she will be resuming her coumadin ~ 05/07/12. Will recheck INR after being on coumadin for ~ 10days. Recheck INR on 05/19/12; lab at 1pm and coumadin clinic at 1:15pm. [employer] has been called and will provide transportation that day.   During the coumadin clinic visit today, Dr. Gaylyn Rong requested that pt return in 10-14 days to for office visit with Dr. Gaylyn Rong.  Pt will return to see Dr. Gaylyn Rong on 04/26/12. Dr. Gaylyn Rong will closely evaluate pt case to determine and discuss with pt whether coumadin therapy should be continued in the setting of GAVE & frequent EGD w/ cauterization which requires holding coumadin therapy.

## 2012-04-19 ENCOUNTER — Telehealth: Payer: Self-pay | Admitting: Pharmacist

## 2012-04-20 NOTE — Telephone Encounter (Signed)
Called pt and let her know that [facility] has been arranged for her 04/26/12 Lab/MD visit and 05/19/12 lab/coumadin clinic visit. Pt stated that she could not make the Dr. Gaylyn Rong visit on 04/26/12 because she has another MD appt with [name] at 2pm on 04/26/12. I cancelled the transportation for 04/26/12 with [facility]. Note sent to scheduling to reschedule 04/26/12 MD apt.

## 2012-04-21 ENCOUNTER — Telehealth: Payer: Self-pay | Admitting: Oncology

## 2012-04-21 NOTE — Telephone Encounter (Signed)
returned pt call to r/s appt...Marland KitchenMarland KitchenDone

## 2012-04-26 ENCOUNTER — Encounter: Payer: Self-pay | Admitting: Internal Medicine

## 2012-04-26 ENCOUNTER — Other Ambulatory Visit: Payer: PRIVATE HEALTH INSURANCE | Admitting: Lab

## 2012-04-26 ENCOUNTER — Ambulatory Visit: Payer: PRIVATE HEALTH INSURANCE | Admitting: Oncology

## 2012-04-26 DIAGNOSIS — R269 Unspecified abnormalities of gait and mobility: Secondary | ICD-10-CM | POA: Insufficient documentation

## 2012-05-05 ENCOUNTER — Other Ambulatory Visit: Payer: Self-pay | Admitting: Gastroenterology

## 2012-05-06 ENCOUNTER — Ambulatory Visit (HOSPITAL_BASED_OUTPATIENT_CLINIC_OR_DEPARTMENT_OTHER): Payer: PRIVATE HEALTH INSURANCE | Admitting: Oncology

## 2012-05-06 ENCOUNTER — Other Ambulatory Visit: Payer: PRIVATE HEALTH INSURANCE | Admitting: Lab

## 2012-05-06 DIAGNOSIS — I82409 Acute embolism and thrombosis of unspecified deep veins of unspecified lower extremity: Secondary | ICD-10-CM

## 2012-05-06 NOTE — Progress Notes (Signed)
No show.  Reminder letter sent.   

## 2012-05-14 ENCOUNTER — Ambulatory Visit (HOSPITAL_COMMUNITY)
Admission: RE | Admit: 2012-05-14 | Discharge: 2012-05-14 | Disposition: A | Discharge status: Home / Self Care | Payer: PRIVATE HEALTH INSURANCE | Source: Ambulatory Visit | Attending: Gastroenterology | Admitting: Gastroenterology

## 2012-05-14 ENCOUNTER — Encounter (HOSPITAL_COMMUNITY)
Admission: RE | Disposition: A | Discharge status: Home / Self Care | Payer: Self-pay | Source: Ambulatory Visit | Attending: Gastroenterology

## 2012-05-14 SURGERY — EGD (ESOPHAGOGASTRODUODENOSCOPY)
Anesthesia: Moderate Sedation

## 2012-05-19 ENCOUNTER — Ambulatory Visit: Payer: PRIVATE HEALTH INSURANCE

## 2012-05-19 ENCOUNTER — Other Ambulatory Visit: Payer: PRIVATE HEALTH INSURANCE | Admitting: Lab

## 2012-05-20 ENCOUNTER — Telehealth: Payer: Self-pay | Admitting: Pharmacist

## 2012-05-20 ENCOUNTER — Other Ambulatory Visit: Payer: Self-pay | Admitting: Internal Medicine

## 2012-05-20 ENCOUNTER — Telehealth: Payer: Self-pay | Admitting: *Deleted

## 2012-05-20 DIAGNOSIS — I82409 Acute embolism and thrombosis of unspecified deep veins of unspecified lower extremity: Secondary | ICD-10-CM

## 2012-05-20 NOTE — Telephone Encounter (Signed)
Call from nurse at the cancer asking that PT/INR can be done at pt's clinic visit tomorrow. (pt seeing Dr Josem Kaufmann)    Dr. Jethro Bolus is regulating pt's coumadin and request this lab work.  Dr Gaylyn Rong will f/u on the results of labs in EMR.

## 2012-05-20 NOTE — Telephone Encounter (Signed)
Labs ordered.

## 2012-05-20 NOTE — Telephone Encounter (Signed)
Pt restarted coumadin on 05/14/12 after procedure (EGD with APC) cancelled on 05/14/12. Pt sees Dr. Josem Kaufmann on 05/21/12. Called [facility].  Spoke with Dr. Charlesetta Shanks nurse.  She will add PT/INR to lab orders for 05/21/12. We will be able to see results in EPIC.

## 2012-05-21 ENCOUNTER — Ambulatory Visit: Payer: Self-pay | Admitting: Pharmacist

## 2012-05-21 ENCOUNTER — Ambulatory Visit (INDEPENDENT_AMBULATORY_CARE_PROVIDER_SITE_OTHER): Payer: PRIVATE HEALTH INSURANCE | Admitting: Internal Medicine

## 2012-05-21 ENCOUNTER — Encounter: Payer: Self-pay | Admitting: Internal Medicine

## 2012-05-21 VITALS — BP 125/84 | HR 72 | Temp 97.1°F | Wt 288.9 lb

## 2012-05-21 DIAGNOSIS — I82409 Acute embolism and thrombosis of unspecified deep veins of unspecified lower extremity: Secondary | ICD-10-CM

## 2012-05-21 DIAGNOSIS — G8929 Other chronic pain: Secondary | ICD-10-CM

## 2012-05-21 DIAGNOSIS — D509 Iron deficiency anemia, unspecified: Secondary | ICD-10-CM

## 2012-05-21 DIAGNOSIS — Z853 Personal history of malignant neoplasm of breast: Secondary | ICD-10-CM

## 2012-05-21 DIAGNOSIS — N3941 Urge incontinence: Secondary | ICD-10-CM

## 2012-05-21 DIAGNOSIS — D500 Iron deficiency anemia secondary to blood loss (chronic): Secondary | ICD-10-CM

## 2012-05-21 DIAGNOSIS — I82402 Acute embolism and thrombosis of unspecified deep veins of left lower extremity: Secondary | ICD-10-CM

## 2012-05-21 DIAGNOSIS — Z0000 Encounter for general adult medical examination without abnormal findings: Secondary | ICD-10-CM

## 2012-05-21 DIAGNOSIS — M545 Low back pain: Secondary | ICD-10-CM

## 2012-05-21 DIAGNOSIS — M1712 Unilateral primary osteoarthritis, left knee: Secondary | ICD-10-CM

## 2012-05-21 DIAGNOSIS — S62102S Fracture of unspecified carpal bone, left wrist, sequela: Secondary | ICD-10-CM

## 2012-05-21 DIAGNOSIS — S42309S Unspecified fracture of shaft of humerus, unspecified arm, sequela: Secondary | ICD-10-CM

## 2012-05-21 DIAGNOSIS — Z720 Tobacco use: Secondary | ICD-10-CM

## 2012-05-21 DIAGNOSIS — K31819 Angiodysplasia of stomach and duodenum without bleeding: Secondary | ICD-10-CM

## 2012-05-21 DIAGNOSIS — G4733 Obstructive sleep apnea (adult) (pediatric): Secondary | ICD-10-CM

## 2012-05-21 DIAGNOSIS — F17200 Nicotine dependence, unspecified, uncomplicated: Secondary | ICD-10-CM

## 2012-05-21 LAB — POCT INR: INR: 1.09

## 2012-05-21 LAB — IRON: Iron: 80 ug/dL (ref 42–145)

## 2012-05-21 LAB — FERRITIN: Ferritin: 12 ng/mL (ref 10–291)

## 2012-05-21 LAB — PROTIME-INR: INR: 1.09 (ref <1.50)

## 2012-05-21 NOTE — Assessment & Plan Note (Signed)
She continues to have chronic low back pain but it is reasonably well controlled on hydrocodone. We will therefore continue this therapy.

## 2012-05-21 NOTE — Assessment & Plan Note (Signed)
She continues to receive endoscopic cautery per Dr. Elnoria Howard. We will continue the pantoprazole.

## 2012-05-21 NOTE — Progress Notes (Signed)
Subjective:    Patient ID: [name], female    DOB: [birth year], 67 y.o.   MRN: [id number]  HPI  Ms. Corriveau presents today for a mobility examination.  We will also perform a pre-operative evaluation for left wrist surgery at the request of her surgeon.  With regards to the mobility examination, the main diagnosis for the need is left knee arthritis with frequent falls secondary to gait abnormality.  She could benefit from one by allowing her to get around her house from room to room and making it to the bathroom in time which is currently hindered by her knee arthritis.  She has upper extremity and grip strength weakness that prohibits her from being able to use a self-propelled wheelchair (please see physical examination below).  She has had falls with walker use secondary to her gait abnormality and lower extremity weakness.  In addition, she has a left wrist injury with significant pain (7/10) which also precludes safe use of the walker.  With her unsteady gait, weak legs, and inability to step up to a platform a scooter would not be safe for her to use as she could not get safely in or out of it.  In addition, her home environment is not suitable for a scooter to maneuver within.  Despite all of the above, she is mentally intact and has the physical ability to operate a power wheelchair in her home.  She is willing and motivated to use the power wheelchair.  With regards to her pre-operative evaluation, she is planning to have an elective left wrist tendon transposition done.  It is possibly under general anesthesia.  Orthopedic procedures are intermediate risk procedures.  She has only minor clinical predictors, the most notable of which is very low functional capacity.  She does not have a history of coronary artery disease or syndromes, cardiomyopathy, arrhythmias, valvular disease, diabetes, or uncontrolled hypertension.  Her ECG from December was notable only for some early  repolarization.  With this series of facts, the AHA recommends proceeding to the operating room and this was communicated to the surgeon.  She is to continue all of her medications but can stop the coumadin 5 days before the procedure.  She is to resume the coumadin at the earliest appropriate point post-operatively.  Review of Systems  Constitutional: Negative for activity change.  Respiratory: Negative for chest tightness, shortness of breath, wheezing and stridor.   Cardiovascular: Negative for chest pain, palpitations and leg swelling.  Gastrointestinal: Negative for abdominal pain.  Musculoskeletal: Positive for back pain, joint swelling, arthralgias and gait problem. Negative for myalgias.  Skin: Negative for color change, rash and wound.  Neurological: Positive for weakness. Negative for dizziness, seizures, syncope and light-headedness.  Psychiatric/Behavioral: Negative for dysphoric mood.      Objective:   Physical Exam  Constitutional: She is oriented to person, place, and time. She appears well-developed and well-nourished. No distress.  HENT:  Head: Normocephalic and atraumatic.  Eyes: Conjunctivae are normal. Right eye exhibits no discharge. Left eye exhibits no discharge. No scleral icterus.  Neck: Neck supple.  Musculoskeletal:       Left wrist: She exhibits decreased range of motion, tenderness and bony tenderness.  Upper Extremities  Right Grip strength 3/5 Extension at elbow 3/5 Flexon at elbow 3/5 Adduction at shoulder 3/5 Abduction at shoulder 3/5  Left Grip strength 1/5 Extension at elbow 1/5 Flexion at elbow 4/5 Adduction at shoulder 2/5 Abduction at shoulder 3/5  Lower Extremities  Right  Hip flexors 1/5 Knee extensors 3/5 Foot extensors 4/5  Left Hip flexors 1/5 Knee extensors 2/5 Foot extensors 4/5  Neurological: She is alert and oriented to person, place, and time. She displays no atrophy. She exhibits normal muscle tone.  Skin: Skin is warm  and dry. No rash noted. She is not diaphoretic. No erythema.  Psychiatric: She has a normal mood and affect. Her behavior is normal. Judgment and thought content normal.      Assessment & Plan:   Please see Problem Oriented Charting.

## 2012-05-21 NOTE — Assessment & Plan Note (Signed)
Mobility evaluation was completed today and all the paperwork was filled out for a power wheelchair. The hydrocodone was continued as it is effective.

## 2012-05-21 NOTE — Patient Instructions (Signed)
Continue to hold coumadin.  Pt will call on Mon with new procedure date.

## 2012-05-21 NOTE — Patient Instructions (Signed)
It was good to see you today.  1) I will finish up the paperwork for the power wheelchair.  2) We completed the paperwork for your surgery.  3) Please keep taking your medications as prescribed.  4) We will order the mammogram and DEXA scans in the afternoon.  5) Please work on the [employer].  I will see you back in 2-3 months.

## 2012-05-21 NOTE — Assessment & Plan Note (Signed)
We again reviewed the importance of smoking cessation. She stated she is not mentally ready to quit smoking at this time. She was also not interested in any pharmacologic therapy to assist her in smoking cessation. She was asked to call the clinic should she change her mind on the need for any pharmacologic therapy.

## 2012-05-21 NOTE — Assessment & Plan Note (Signed)
She states she's been compliant with her iron therapy. I checked a ferritin and iron today and results are pending at this time. We'll make any further adjustments in her iron therapy pending the results of the studies.

## 2012-05-21 NOTE — Assessment & Plan Note (Signed)
She notes worsening of her nocturnal urinary incontinence. Although it occasionally occurs with coughing, most of the time it occurs at night after lying down. The vast majority of the urine that has leaked occurs in the setting of an urge to urinate but inability to get to the bathroom in time. Therefore, she has urge predominant mixed urinary incontinence. A big component of this problem is likely her obesity. With her inability to exercise I'm concerned this may not improve. She also has difficulty ambulating and getting to the bathroom and I don't foresee this improving much either although a power wheelchair may assist in her ability to get around her house more efficiently and avoid any untoward urinary events. She does drink carbonated beverages and I asked her to limit these and definitely not drink any carbonated beverage after 3 PM. She was given instructions on how to perform Kegel exercises and I stressed the importance of following these written instructions in hopes of improving her urinary incontinence. We will reassess her symptoms after Kegel exercises and the change in her carbonated beverages at the followup visit.

## 2012-05-21 NOTE — Assessment & Plan Note (Signed)
I explored some of the reasons why she is noncompliant with her nocturnal nasal CPAP. She states the mask was not fitting. We will discuss the benefits of retrying nocturnal nasal CPAP after a mask refitting in the future when she is more accepting of this possibility.

## 2012-05-21 NOTE — Assessment & Plan Note (Signed)
She did not establish her INR monitoring with Dr. Alexandria Lodge. I placed another referral for INR monitoring with Dr. Alexandria Lodge. She mentioned that she would prefer to have her INR's monitored [facility] than [facility] as she already comes here for many medical issues including her primary care, so she was supportive of this change.

## 2012-05-21 NOTE — Progress Notes (Signed)
F/U with patient via telephone for her INR drawn at her PCP. INR=1.09 Pt had been holding coumadin for her procedure that was cancelled. Per Dr. Gaylyn Rong continue to hold coumadin until after procedure. She will call us in clinic on Monday to let us know of new date. Dr. Gaylyn Rong would also like to see the patient prior to his departure in July. This request was sent to scheduling in basket.

## 2012-05-21 NOTE — Assessment & Plan Note (Signed)
She has a history of breast cancer and is status post bilateral mastectomies. Otherwise she requires further screening for breast cancer. Therefore a referral for mammography was placed.

## 2012-05-24 ENCOUNTER — Telehealth: Payer: Self-pay | Admitting: Oncology

## 2012-05-24 ENCOUNTER — Other Ambulatory Visit: Payer: Self-pay | Admitting: Gastroenterology

## 2012-05-24 ENCOUNTER — Encounter: Payer: Self-pay | Admitting: Pharmacist

## 2012-05-24 NOTE — Progress Notes (Signed)
INR 1.3  Below target.  [facility] requested this test be drawn.  They stated they would follow-up on it.  Fe 80 Ferritin 12  Both within normal range and improved although ferritin is in the very low normal range.  It is prudent to continue the iron supplementation for now until the GI bleeding issue has been fully addressed.

## 2012-05-24 NOTE — Progress Notes (Signed)
Pt will have her next EGD on 05/28/12 w/ Dr. Elnoria Howard. She is off Coumadin & will be off Coumadin for the week following her EGD. Pt will restart Coumadin approx 5/16.  We will recheck her INR at [facility] on 06/15/12.  [facility] called to coordinate transportation for that visit. Pt told me over the phone today that she has hand surgery w/ Dr. Orlan Leavens scheduled for 06/24/12.  She will need to be off Coumadin for this as well. I contacted Alfonso Ramus, surgery scheduler for Dr. Orlan Leavens (ph [phone]) to confirm pt is having surgery & if so, to get the date of that surgery.  We will need to coordinate w/ their office on bridging plan.   Pt will go to rehab after her hand surgery. Per last visit note w/ Dr. Josem Kaufmann, it is possible that pt will be changing her Coumadin management to [facility].  Dr. Gaylyn Rong made aware. Ebony Hail, Pharm.D., CPP 05/24/2012@4 :28 PM

## 2012-05-25 ENCOUNTER — Encounter: Payer: Self-pay | Admitting: Pharmacist

## 2012-05-25 NOTE — Progress Notes (Signed)
Ms Lobue is scheduled to have tendon transfer on 06/24/12 per Dr. Bari Edward office.  Will inform Dr. Gaylyn Rong and coordinate anticoag plan.

## 2012-05-26 ENCOUNTER — Telehealth: Payer: Self-pay | Admitting: Oncology

## 2012-05-26 ENCOUNTER — Other Ambulatory Visit: Payer: Self-pay | Admitting: Oncology

## 2012-05-26 DIAGNOSIS — I82409 Acute embolism and thrombosis of unspecified deep veins of unspecified lower extremity: Secondary | ICD-10-CM

## 2012-05-28 ENCOUNTER — Encounter (HOSPITAL_COMMUNITY): Payer: Self-pay | Admitting: Pharmacy Technician

## 2012-05-31 ENCOUNTER — Telehealth: Payer: Self-pay | Admitting: *Deleted

## 2012-05-31 NOTE — Telephone Encounter (Signed)
Pt calls and request that power wh/ch be expedited, is there something you can do? You may call her at [phone]

## 2012-06-01 ENCOUNTER — Telehealth: Payer: Self-pay | Admitting: Licensed Clinical Social Worker

## 2012-06-01 NOTE — Telephone Encounter (Signed)
Spoke with Ms. Desrochers this morning.  Reassured her that the PWC was in process.  Her office examination for the PWC was just completed on 05/21/2012 along with the [facility] Rep [name].  Explained to the patient that the process takes longer than 10 days to complete.  Sometimes it can take up to 6-8 weeks to process. She thought that her PWC was being denied because it had not been delivered yet.  Called [name] at [employer] and touched base with her about the patient's concerns. The paperwork it being processed correctly and yes she would have to  wait until her insurance is done reviewing her case for the PWC.  Ms. Croston was very thankful for the information she was provided and feels more comfortable knowing she was not denied her PWC.

## 2012-06-01 NOTE — Telephone Encounter (Signed)
Chilon, Would you be able to offer any information regarding [name]. Unfortunately, I do not have any information.  Thanks

## 2012-06-03 ENCOUNTER — Telehealth: Payer: Self-pay | Admitting: Licensed Clinical Social Worker

## 2012-06-03 NOTE — Telephone Encounter (Signed)
CSW received call from [name], [facility] agency.  Pt has schedule an outpatient surgery and requires companion care prior and following this procedure.  Agency has informed Ms. [name], PCS does not cover companion care for medical procedures only assistance with ADL's.  In addition, [name] has been having increased urinary and bowel incontinence.  Agency reports pt unable to utilize standard bedside commode and is working on obtaining an oversized bedside commode.  Agency concerned that eventually [name] will have skin breakdown, as they do not provide pt with 24 hr care. CSW placed call to [name] informed patient regarding the care services provided by [facility], stressing PCS does not cover companion or sitter services.  Encouraged [name] to discuss with son as to best time family would be able to attend procedure with pt.  [name] states son works out of town and just started job, CSW discussed possibility of having son take vacation time or day off to assist with pt's care.  In addition, CSW offered option of pt paying companion privately or obtain family friend/neighbor.  CSW encouraged [name] to discuss with Dr. Elnoria Howard office that pt does not have available companion services and son works. CSW attempted to notify [employer] to informed agency, CSW reiterated [employer] covered services.

## 2012-06-04 ENCOUNTER — Ambulatory Visit (HOSPITAL_COMMUNITY)
Admission: RE | Admit: 2012-06-04 | Payer: PRIVATE HEALTH INSURANCE | Source: Ambulatory Visit | Admitting: Gastroenterology

## 2012-06-04 ENCOUNTER — Encounter (HOSPITAL_COMMUNITY): Admission: RE | Payer: Self-pay | Source: Ambulatory Visit

## 2012-06-04 SURGERY — EGD (ESOPHAGOGASTRODUODENOSCOPY)
Anesthesia: Moderate Sedation

## 2012-06-15 ENCOUNTER — Other Ambulatory Visit: Payer: PRIVATE HEALTH INSURANCE | Admitting: Lab

## 2012-06-15 ENCOUNTER — Ambulatory Visit: Payer: PRIVATE HEALTH INSURANCE

## 2012-06-15 NOTE — Progress Notes (Signed)
Anesthesia Chart Review:  Patient is a 67 year old female scheduled for left thumb EIP to EPL tendon transfer by [name] on 06/24/12.  Her PAT visit is scheduled for 06/18/12.   History includes left carpectomy 07/23/11, smoking, post-operative N/V, morbid obesity, COPD, GERD, HTN, HLD, interstitial cystitis, OSA with history of CPAP non-compliance and night time O2, PE '09, DVT 05/2010, prior cervical and lumbar spine surgeries, rhinoplasty '93, breast cancer s/p bilateral mastectomies '92, basal cell carcinoma, fibromyalgia, anxiety, depression, iron deficiency anemia. 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 Elnoria Howard and further treated with [facility] in January and March 2014.) Dr. Gaylyn Rong is her Hematologist who has recommended life long anticoagulation due to her history of PE, DVT, morbid obesity, and inactivity.  Dr. Lodema Pilot office was notified of planned procedure.  Patient receives primary care thru [facility], last with Dr. Doneen Poisson on 05/21/12 who cleared patient for this procedure with low cardiac risk and instructions that she may hold her Coumadin five days preoperatively.    EKG on 12/23/11 showed SR with PACs, non-specific T wave abnormality.  Artifact in V6.  Echo summary on 12/26/11 showed: Normal LV size and systolic function, EF 55-60%. Mild LV hypertrophy. Normal RV size and systolic function. Mild pulmonary hypertension. Biatrial enlargement. Trivial TR.  Currently, her last CXR on 12/26/11 showed diffuse bilateral airspace disease likely reflecting pulmonary edema, small bilateral pleural effusions.  If not yet repeated, I would favor repeating a CXR preoperatively.  PFTs findings on 05/08/11 were consistent with restrictive lung disease, no significant response to bronchodilator. Pre-FVC  2.17 (66%) and showed FEV1 1.81 (73%). Full report is under Media tab.  Additional labs pending her PAT appointment.  Velna Ochs [facility] Stay Center/Anesthesiology Phone [phone] 06/15/2012 2:00 PM

## 2012-06-16 ENCOUNTER — Ambulatory Visit (HOSPITAL_COMMUNITY)
Admission: RE | Admit: 2012-06-16 | Discharge: 2012-06-16 | Disposition: A | Discharge status: Home / Self Care | Payer: PRIVATE HEALTH INSURANCE | Source: Ambulatory Visit | Attending: Internal Medicine | Admitting: Internal Medicine

## 2012-06-16 ENCOUNTER — Encounter (HOSPITAL_COMMUNITY): Payer: Self-pay | Admitting: Pharmacy Technician

## 2012-06-16 DIAGNOSIS — Z853 Personal history of malignant neoplasm of breast: Secondary | ICD-10-CM

## 2012-06-16 DIAGNOSIS — Z780 Asymptomatic menopausal state: Secondary | ICD-10-CM | POA: Insufficient documentation

## 2012-06-16 DIAGNOSIS — Z1231 Encounter for screening mammogram for malignant neoplasm of breast: Secondary | ICD-10-CM | POA: Insufficient documentation

## 2012-06-16 DIAGNOSIS — S62102S Fracture of unspecified carpal bone, left wrist, sequela: Secondary | ICD-10-CM

## 2012-06-16 DIAGNOSIS — Z13820 Encounter for screening for osteoporosis: Secondary | ICD-10-CM | POA: Insufficient documentation

## 2012-06-17 ENCOUNTER — Telehealth: Payer: Self-pay | Admitting: Oncology

## 2012-06-17 ENCOUNTER — Ambulatory Visit (HOSPITAL_BASED_OUTPATIENT_CLINIC_OR_DEPARTMENT_OTHER): Payer: PRIVATE HEALTH INSURANCE | Admitting: Oncology

## 2012-06-17 ENCOUNTER — Ambulatory Visit: Payer: PRIVATE HEALTH INSURANCE

## 2012-06-17 ENCOUNTER — Other Ambulatory Visit (HOSPITAL_BASED_OUTPATIENT_CLINIC_OR_DEPARTMENT_OTHER): Payer: PRIVATE HEALTH INSURANCE | Admitting: Lab

## 2012-06-17 ENCOUNTER — Encounter: Payer: Self-pay | Admitting: Pharmacist

## 2012-06-17 VITALS — BP 147/75 | HR 68 | Temp 97.1°F | Resp 19 | Ht 67.0 in

## 2012-06-17 DIAGNOSIS — Z86718 Personal history of other venous thrombosis and embolism: Secondary | ICD-10-CM

## 2012-06-17 DIAGNOSIS — I82402 Acute embolism and thrombosis of unspecified deep veins of left lower extremity: Secondary | ICD-10-CM

## 2012-06-17 DIAGNOSIS — I82409 Acute embolism and thrombosis of unspecified deep veins of unspecified lower extremity: Secondary | ICD-10-CM

## 2012-06-17 LAB — CBC WITH DIFFERENTIAL/PLATELET
Basophils Absolute: 0 10*3/uL (ref 0.0–0.1)
EOS%: 2.7 % (ref 0.0–7.0)
Eosinophils Absolute: 0.2 10*3/uL (ref 0.0–0.5)
HCT: 40.9 % (ref 34.8–46.6)
HGB: 13 g/dL (ref 11.6–15.9)
MCH: 25.5 pg (ref 25.1–34.0)
MCV: 79.9 fL (ref 79.5–101.0)
NEUT#: 5.2 10*3/uL (ref 1.5–6.5)
NEUT%: 69.9 % (ref 38.4–76.8)
RDW: 21.3 % — ABNORMAL HIGH (ref 11.2–14.5)
lymph#: 1.5 10*3/uL (ref 0.9–3.3)

## 2012-06-17 LAB — BASIC METABOLIC PANEL (CC13)
CO2: 30 mEq/L — ABNORMAL HIGH (ref 22–29)
Chloride: 105 mEq/L (ref 98–107)
Potassium: 3.6 mEq/L (ref 3.5–5.1)
Sodium: 141 mEq/L (ref 136–145)

## 2012-06-17 LAB — PROTIME-INR: INR: 1.1 — ABNORMAL LOW (ref 2.00–3.50)

## 2012-06-17 MED ORDER — ENOXAPARIN SODIUM 100 MG/ML ~~LOC~~ SOLN: 100.0000 mg | Freq: Two times a day (BID) | SUBCUTANEOUS | Status: DC

## 2012-06-17 NOTE — Progress Notes (Signed)
Per converstation with Dr. Gaylyn Rong, he has stopped coumadin and started lovenox.  She is not a candidate for xarelto with hx of GI bleeds and monthly procedures. Pt states she will fill rx for lovenox.  If she is unable to attain the RX he will refer her to financial advocates for patient assistance programs.

## 2012-06-17 NOTE — Pre-Procedure Instructions (Signed)
WLADYSLAWA DISBRO  06/17/2012   Your procedure is scheduled on: Thursday, June 5th   Report to [facility] at 10:30 AM.             (Come through Entrance "A"..follow signs/hallway to EAST Elevator, and go to 3rd floor.   Call this number if you have problems the morning of surgery: [phone]   Remember:   Do not eat food or drink liquids after midnight Wednesday.   Take these medicines the morning of surgery with A SIP OF WATER: Nebulizers, Gabapentin, Lamictal, Ativan (If needed),               Protonix, Zoloft   Do not wear jewelry, make-up or nail polish.  Do not wear lotions, powders, or perfumes. You may wear deodorant.  Do not shave underarms & legs 48 hours prior to surgery.   Do not bring valuables to the hospital.  [facility] is not responsible for any belongings or valuables.   Contacts, dentures or bridgework may not be worn into surgery.  Leave suitcase in the car. After surgery it may be brought to your room.  For patients admitted to the hospital, checkout time is 11:00 AM the day of discharge.   Patients discharged the day of surgery will not be allowed to drive home.   Name and phone number of your driver:    Special Instructions: Shower using CHG 2 nights before surgery and the night before surgery.  If you shower the day of surgery use CHG.  Use special wash - you have one bottle of CHG for all showers.  You should use approximately 1/3 of the bottle for each shower.   Please read over the following fact sheets that you were given: Pain Booklet, Coughing and Deep Breathing, MRSA Information and Surgical Site Infection Prevention

## 2012-06-17 NOTE — Progress Notes (Signed)
[facility]  [phone] [phone]   OFFICE PROGRESS NOTE   Cc:  Rocco Serene, MD  DIAGNOSIS:  Extensive LLE DVT, history of PE.   CURRENT THERAPY: Lifelong anticoagulation.    INTERVAL HISTORY: [name] 67 y.o. female returns for regular follow up by herself.  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  She requires monthly EGD and treatment by Dr. Elnoria Howard.  She still has left LE edema when she is on her feet or sitting too long.  At night, when she elevates her legs, the edema goes away.  She has chronic bilateral knee pain, hip pain, and is wheelchair dependent.  She is due to have orthopedic surgery soon on her left thumb by [name].  She still smokes cigarettes.  She has chronic, stable bronchitic cough.  She denied fever, anorexia, weight loss, headache, visual changes, confusion, drenching night sweats, palpable lymph node swelling, mucositis, odynophagia, dysphagia, nausea vomiting, jaundice, chest pain, palpitation, shortness of breath, dyspnea on exertion, gum bleeding, epistaxis, hematemesis, hemoptysis, abdominal pain, abdominal swelling, early satiety, melena, hematochezia, hematuria, skin rash, spontaneous bleeding, heat or cold intolerance, bowel bladder incontinence.   Past Medical History  Diagnosis Date  . Chronic obstructive pulmonary disease   . Depression     followed by Dr. Gwyndolyn Kaufman, [facility]  . Gastroesophageal reflux disease   . Hypertension   . Hyperlipidemia   . Interstitial cystitis     followed by Dr. Isabel Caprice  . Irritable bowel syndrome     Colonoscopy 12/2003 and EGD 1997 WNL  . Spinal stenosis     L3-L4, L4-L5 per MRI 12/2004, s/p decompression 02/2007 by Dr. Gerlene Fee, residual chronic low back pain  . 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 Obstructive sleep apnea     Noncompliant with CPAP "ineffective"  . Basal cell carcinoma 09/2001    excised from nose  .  Breast cancer     Bilateral s/p mastectomies  . Arthritis   . Anxiety   . 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 Elnoria Howard  . History of blood transfusion     during GAVE associated GI bleed  . Cataract     Left eye pending surgery  . Clotting disorder 2009    LLE DVT and PE (1 month apart)  . Fibromyalgia     Past Surgical History  Procedure Laterality Date  . Cervical discectomy  11/1995 and 05/2002    by Dr. Gerlene Fee  . Abdominal hysterectomy  1976  . Bladder suspension  1993  . Orif ankle fracture  1993  . Lumbar laminectomy/decompression microdiscectomy  02/2007    by Dr. Gerlene Fee  . Carpal tunnel release      LEFT  . Tonsillectomy and adenoidectomy  1953  . Appendectomy    . Dilation and curettage of uterus    . Tubal ligation    . Carpectomy  07/23/2011    Procedure: CARPECTOMY;  Surgeon: Sharma Covert, MD;  Location: [facility] OR;  Service: Orthopedics;  Laterality: Left;  LEFT WRIST PROXIMAL ROW CARPECTOMY  . Esophagogastroduodenoscopy  12/25/2011    Procedure: ESOPHAGOGASTRODUODENOSCOPY (EGD);  Surgeon: Theda Belfast, MD;  Location: [facility] ENDOSCOPY;  Service: Endoscopy;  Laterality: N/A;  . Breast surgery  1992    Bilateral mastectomies for breast cancer  . Rhinoplasty  1993  . Spine surgery    . Hot hemostasis  02/06/2012    Procedure: HOT HEMOSTASIS (ARGON PLASMA COAGULATION/BICAP);  Surgeon: Theda Belfast, MD;  Location: [facility] ENDOSCOPY;  Service: Endoscopy;  Laterality: N/A;  . Esophagogastroduodenoscopy  02/06/2012    Procedure: ESOPHAGOGASTRODUODENOSCOPY (EGD);  Surgeon: Theda Belfast, MD;  Location: [facility] ENDOSCOPY;  Service: Endoscopy;  Laterality: N/A;  . Esophagogastroduodenoscopy N/A 04/02/2012    Procedure: ESOPHAGOGASTRODUODENOSCOPY (EGD);  Surgeon: Theda Belfast, MD;  Location: [facility] ENDOSCOPY;  Service: Endoscopy;  Laterality: N/A;  EGD w/ APC    Current Outpatient Prescriptions  Medication Sig Dispense Refill  . albuterol (PROVENTIL) (5 MG/ML)  0.5% nebulizer solution Take 2.5 mg by nebulization every 6 (six) hours as needed for wheezing or shortness of breath.      . enoxaparin (LOVENOX) 100 MG/ML injection Inject 1 mL (100 mg total) into the skin every 12 (twelve) hours.  60 Syringe  2  . ferrous sulfate 325 (65 FE) MG EC tablet Take 325 mg by mouth 3 (three) times daily with meals.      . gabapentin (NEURONTIN) 100 MG capsule Take 100 mg by mouth 3 (three) times daily.       Marland Kitchen ipratropium (ATROVENT) 0.02 % nebulizer solution Take 0.5 mg by nebulization 3 (three) times daily as needed for wheezing.      . lamoTRIgine (LAMICTAL) 200 MG tablet Take 200 mg by mouth every morning.      Marland Kitchen LORazepam (ATIVAN) 1 MG tablet Take 1-2 mg by mouth 2 (two) times daily. Take one tablet in morning and two tablets by mouth at bedtime      . pantoprazole (PROTONIX) 40 MG tablet Take 40 mg by mouth daily.      . pravastatin (PRAVACHOL) 40 MG tablet Take 40 mg by mouth every morning.      Marland Kitchen QUEtiapine (SEROQUEL) 200 MG tablet Take 400 mg by mouth at bedtime.       . sertraline (ZOLOFT) 100 MG tablet Take 200 mg by mouth every morning.       . vitamin C (ASCORBIC ACID) 500 MG tablet Take 1 tablet (500 mg total) by mouth daily.  90 tablet  0  . warfarin (COUMADIN) 4 MG tablet Take 2-4 mg by mouth daily. Alternates between 2 mg and 4mg  daily.  (2 mg one day, 4 mg the next, then back to 2 mg)       No current facility-administered medications for this visit.    ALLERGIES:  is allergic to adhesive; codeine; morphine; and sulfonamide derivatives.  REVIEW OF SYSTEMS:  The rest of the 14-point review of system was negative.   Filed Vitals:   06/17/12 1409  BP: 147/75  Pulse: 68  Temp: 97.1 F (36.2 C)  Resp: 19   Wt Readings from Last 3 Encounters:  05/21/12 288 lb 14.4 oz (131.044 kg)  04/12/12 284 lb 6.4 oz (129.003 kg)  02/06/12 280 lb (127.007 kg)   ECOG Performance status: 2  PHYSICAL EXAMINATION:   General: Morbidly obese woman in no  acute distress. Eyes: no scleral icterus. ENT: There were no oropharyngeal lesions. Neck was without thyromegaly. Lymphatics: Negative cervical, supraclavicular or axillary adenopathy. Respiratory: lungs were clear bilaterally without wheezing or crackles. Cardiovascular: Regular rate and rhythm, S1/S2, without murmur, rub or gallop. There was about 1+ pedal edema in the left leg.  GI: abdomen was obese but nontender, nondistended, without organomegaly. Muscoloskeletal: no spinal tenderness of palpation of vertebral spine. Skin exam was without echymosis, petichae.  She was wheelchair bound. Patient was  alert and oriented. Attention was good. Language was appropriate. Mood was depressed; and she was teary. Speech was not pressured. Thought content was not tangential.    LABORATORY/RADIOLOGY DATA:  Lab Results  Component Value Date   WBC 7.5 06/17/2012   HGB 13.0 06/17/2012   HCT 40.9 06/17/2012   PLT 271 06/17/2012   GLUCOSE 90 06/17/2012   CHOL 170 05/03/2009   TRIG 166* 05/03/2009   HDL 41 05/03/2009   LDLCALC 96 05/03/2009   ALKPHOS 128 04/12/2012   ALT 7 04/12/2012   AST 11 04/12/2012   NA 141 06/17/2012   K 3.6 06/17/2012   CL 105 06/17/2012   CREATININE 0.7 06/17/2012   BUN 6.5* 06/17/2012   CO2 30* 06/17/2012   INR 1.10* 06/17/2012   HGBA1C 6.1* 12/23/2011   MICROALBUR 1.23 08/25/2006     ASSESSMENT AND PLAN:    1.   History of gastric antral vascular ectasia: she requires frequent EGD and intervention now.  She is still on iron.  2.    History of pulmonary embolism (PE) on the right, subsegmental, pulmonary embolism in 2009 with diagnosis of left lower extremity extensive deep venous thrombosis (DVT) in May 2012.  She has high risk features with morbid obesity and limited mobility and history of recurrent thrombosis. She would benefit from lifelong anti coagulation.  Her risk of recurrent thrombosis is high being off of anticoagulation.  It has been very difficult for her to have therapeutic INR  due to diet, needing to go off of Coumadin for procedures/operations, transportation issue.  She is not a good candidate for [place] due to history of GI bleed.  Therefore, I recommended Lovenox 1mg /kg SQ BID (or 100mg  SQ BID).  Her Cr is stable.  We will see her again in about 2 months to ensure that she is doing well on Lovenox.  When she no longer has GI bleed, we may consider switching back to Coumadin or Xeralto.  Ms. Sakata expressed informed understanding and wished to proceed.  I advised her to go off of Lovenox for about 24 hours before any procedure and resume it the day after if there is no active bleeding.  3.    Follow up:  In about 2 months.    I informed Ms. Racey that I am leaving the practice.  [facility] will [facility] for her to see a new provider when she returns.     The length of time of the face-to-face encounter was 25 minutes. More than 50% of time was spent counseling and coordination of care.

## 2012-06-18 ENCOUNTER — Ambulatory Visit (HOSPITAL_COMMUNITY)
Admission: RE | Admit: 2012-06-18 | Discharge: 2012-06-18 | Disposition: A | Discharge status: Home / Self Care | Payer: PRIVATE HEALTH INSURANCE | Source: Ambulatory Visit | Attending: Orthopedic Surgery | Admitting: Orthopedic Surgery

## 2012-06-18 ENCOUNTER — Encounter (HOSPITAL_COMMUNITY)
Admission: RE | Admit: 2012-06-18 | Discharge: 2012-06-18 | Disposition: A | Discharge status: Home / Self Care | Payer: PRIVATE HEALTH INSURANCE | Source: Ambulatory Visit | Attending: Orthopedic Surgery | Admitting: Orthopedic Surgery

## 2012-06-18 ENCOUNTER — Encounter (HOSPITAL_COMMUNITY): Payer: Self-pay

## 2012-06-18 DIAGNOSIS — Z01812 Encounter for preprocedural laboratory examination: Secondary | ICD-10-CM | POA: Insufficient documentation

## 2012-06-18 DIAGNOSIS — M66239 Spontaneous rupture of extensor tendons, unspecified forearm: Secondary | ICD-10-CM | POA: Insufficient documentation

## 2012-06-18 DIAGNOSIS — Z7901 Long term (current) use of anticoagulants: Secondary | ICD-10-CM | POA: Insufficient documentation

## 2012-06-18 DIAGNOSIS — J8410 Pulmonary fibrosis, unspecified: Secondary | ICD-10-CM | POA: Insufficient documentation

## 2012-06-18 DIAGNOSIS — M66249 Spontaneous rupture of extensor tendons, unspecified hand: Secondary | ICD-10-CM | POA: Insufficient documentation

## 2012-06-18 DIAGNOSIS — Z01818 Encounter for other preprocedural examination: Secondary | ICD-10-CM | POA: Insufficient documentation

## 2012-06-18 DIAGNOSIS — F17200 Nicotine dependence, unspecified, uncomplicated: Secondary | ICD-10-CM | POA: Insufficient documentation

## 2012-06-18 HISTORY — DX: Adverse effect of unspecified anesthetic, initial encounter: T41.45XA

## 2012-06-18 HISTORY — DX: Other complications of anesthesia, initial encounter: T88.59XA

## 2012-06-18 LAB — SURGICAL PCR SCREEN: Staphylococcus aureus: POSITIVE — AB

## 2012-06-21 NOTE — Progress Notes (Signed)
Anesthesia Update:  Please refer to my initial note dated 06/15/12.  Since then, patient has had her PAT visit and has had another hematology visit with Dr. Gaylyn Rong.  Because a therapeutic INR has been difficult to maintain due to diet, transportation, and need to hold for procedures, Dr. Gaylyn Rong has recommended that she start Lovenox 1mg /kg SQ BID instead.  She reports that she was already instructed to hold the Lovenox two days prior to surgery with plans to resume one day afterwards.  She denies any obvious bleeding.  She does bruise easily (not new) since being on anti-coagulation therapy.  Will plan to get a PT/PTT on arrival.  (Dr. Gaylyn Rong is leaving the practice, so she will eventually be assigned to a new hematologist.  She reports that she will likely get any routine anticoagulation monitoring thru her PCP office.)  Of note, she reports three prior hand procedures, last on 07/22/12 (left wrist carpectomy under GA). To her recollection, it was this procedure that she was aware (after "anesthesia" given) despite being told that she was going to have general anesthesia.  She at least remembers [name] injecting what sounds like local anesthesia, and him telling the anesthesia team that he would like her "asleep."  I didn't see any specific mention of awareness/wakefullness in the operative report or anesthesia records.  It could be possible that this occurred with one of her earlier hand procedures or perhaps local anesthesia was given prior to induction.  She would like general anesthesia for this procedure.  I told her that [name] has posted the procedure for GA, and her assigned anesthesiologist will talk with her on the day of surgery to discuss the definitive anesthesia plan.  Also, she reports that [name] plans to admit her overnight following surgery.   CXR and labs from 06/17/12 noted.  Velna Ochs [facility] Short Stay Center/Anesthesiology Phone [phone] 06/21/2012 1:12 PM

## 2012-06-21 NOTE — Anesthesia Preprocedure Evaluation (Addendum)
Anesthesia Evaluation  Patient identified by MRN, date of birth, ID band Patient awake    Reviewed: Allergy & Precautions, H&P , NPO status , Patient's Chart, lab work & pertinent test results, reviewed documented beta blocker date and time   History of Anesthesia Complications Negative for: history of anesthetic complications  Airway Mallampati: II TM Distance: >3 FB Neck ROM: Full    Dental  (+) Edentulous Upper, Edentulous Lower and Dental Advisory Given   Pulmonary shortness of breath and with exertion, sleep apnea , pneumonia -, COPD COPD inhaler, Current Smoker,    Pulmonary exam normal       Cardiovascular hypertension, Pt. on medications     Neuro/Psych PSYCHIATRIC DISORDERS Anxiety Depression negative neurological ROS     GI/Hepatic Neg liver ROS, GERD-  Medicated,  Endo/Other  Morbid obesity  Renal/GU negative Renal ROS     Musculoskeletal   Abdominal   Peds  Hematology   Anesthesia Other Findings   Reproductive/Obstetrics                           Anesthesia Physical Anesthesia Plan  ASA: III  Anesthesia Plan: General   Post-op Pain Management:    Induction: Intravenous  Airway Management Planned: Oral ETT  Additional Equipment:   Intra-op Plan:   Post-operative Plan: Extubation in OR  Informed Consent: I have reviewed the patients History and Physical, chart, labs and discussed the procedure including the risks, benefits and alternatives for the proposed anesthesia with the patient or authorized representative who has indicated his/her understanding and acceptance.   Dental advisory given  Plan Discussed with: CRNA, Anesthesiologist and Surgeon  Anesthesia Plan Comments: (See anesthesia notes from 06/15/12 and 06/21/12.  Shonna Chock, PA-C)       Anesthesia Quick Evaluation

## 2012-06-24 ENCOUNTER — Encounter (HOSPITAL_COMMUNITY): Payer: Self-pay | Admitting: Vascular Surgery

## 2012-06-24 ENCOUNTER — Inpatient Hospital Stay (HOSPITAL_COMMUNITY)
Admission: RE | Admit: 2012-06-24 | Discharge: 2012-06-28 | DRG: 513 | Disposition: A | Discharge status: Skilled Nursing Facility | Payer: PRIVATE HEALTH INSURANCE | Source: Ambulatory Visit | Attending: Orthopedic Surgery | Admitting: Orthopedic Surgery

## 2012-06-24 ENCOUNTER — Encounter (HOSPITAL_COMMUNITY): Payer: Self-pay | Admitting: *Deleted

## 2012-06-24 ENCOUNTER — Ambulatory Visit (HOSPITAL_COMMUNITY): Payer: PRIVATE HEALTH INSURANCE | Admitting: Vascular Surgery

## 2012-06-24 ENCOUNTER — Encounter (HOSPITAL_COMMUNITY)
Admission: RE | Disposition: A | Discharge status: Skilled Nursing Facility | Payer: Self-pay | Source: Ambulatory Visit | Attending: Orthopedic Surgery

## 2012-06-24 DIAGNOSIS — Z9981 Dependence on supplemental oxygen: Secondary | ICD-10-CM

## 2012-06-24 DIAGNOSIS — M129 Arthropathy, unspecified: Secondary | ICD-10-CM | POA: Diagnosis present

## 2012-06-24 DIAGNOSIS — K219 Gastro-esophageal reflux disease without esophagitis: Secondary | ICD-10-CM | POA: Diagnosis present

## 2012-06-24 DIAGNOSIS — M66239 Spontaneous rupture of extensor tendons, unspecified forearm: Principal | ICD-10-CM | POA: Diagnosis present

## 2012-06-24 DIAGNOSIS — J449 Chronic obstructive pulmonary disease, unspecified: Secondary | ICD-10-CM | POA: Diagnosis present

## 2012-06-24 DIAGNOSIS — J4489 Other specified chronic obstructive pulmonary disease: Secondary | ICD-10-CM | POA: Diagnosis present

## 2012-06-24 DIAGNOSIS — R32 Unspecified urinary incontinence: Secondary | ICD-10-CM | POA: Diagnosis present

## 2012-06-24 DIAGNOSIS — G4733 Obstructive sleep apnea (adult) (pediatric): Secondary | ICD-10-CM | POA: Diagnosis present

## 2012-06-24 DIAGNOSIS — Z91199 Patient's noncompliance with other medical treatment and regimen due to unspecified reason: Secondary | ICD-10-CM

## 2012-06-24 DIAGNOSIS — Z853 Personal history of malignant neoplasm of breast: Secondary | ICD-10-CM

## 2012-06-24 DIAGNOSIS — Z86718 Personal history of other venous thrombosis and embolism: Secondary | ICD-10-CM

## 2012-06-24 DIAGNOSIS — Z6841 Body Mass Index (BMI) 40.0 and over, adult: Secondary | ICD-10-CM

## 2012-06-24 DIAGNOSIS — E785 Hyperlipidemia, unspecified: Secondary | ICD-10-CM | POA: Diagnosis present

## 2012-06-24 DIAGNOSIS — Z9119 Patient's noncompliance with other medical treatment and regimen: Secondary | ICD-10-CM

## 2012-06-24 DIAGNOSIS — M66249 Spontaneous rupture of extensor tendons, unspecified hand: Principal | ICD-10-CM | POA: Diagnosis present

## 2012-06-24 DIAGNOSIS — F329 Major depressive disorder, single episode, unspecified: Secondary | ICD-10-CM | POA: Diagnosis present

## 2012-06-24 DIAGNOSIS — F3289 Other specified depressive episodes: Secondary | ICD-10-CM | POA: Diagnosis present

## 2012-06-24 DIAGNOSIS — F411 Generalized anxiety disorder: Secondary | ICD-10-CM | POA: Diagnosis present

## 2012-06-24 DIAGNOSIS — I10 Essential (primary) hypertension: Secondary | ICD-10-CM | POA: Diagnosis present

## 2012-06-24 DIAGNOSIS — F17200 Nicotine dependence, unspecified, uncomplicated: Secondary | ICD-10-CM | POA: Diagnosis present

## 2012-06-24 DIAGNOSIS — Z7901 Long term (current) use of anticoagulants: Secondary | ICD-10-CM

## 2012-06-24 DIAGNOSIS — Z85828 Personal history of other malignant neoplasm of skin: Secondary | ICD-10-CM

## 2012-06-24 DIAGNOSIS — IMO0001 Reserved for inherently not codable concepts without codable children: Secondary | ICD-10-CM | POA: Diagnosis present

## 2012-06-24 DIAGNOSIS — Z86711 Personal history of pulmonary embolism: Secondary | ICD-10-CM

## 2012-06-24 HISTORY — PX: TENDON TRANSFER: SHX6109

## 2012-06-24 HISTORY — DX: Bell's palsy: G51.0

## 2012-06-24 HISTORY — DX: Family history of other specified conditions: Z84.89

## 2012-06-24 HISTORY — DX: Malignant melanoma of nose: C43.31

## 2012-06-24 LAB — PROTIME-INR: Prothrombin Time: 13.2 seconds (ref 11.6–15.2)

## 2012-06-24 LAB — APTT: aPTT: 28 seconds (ref 24–37)

## 2012-06-24 SURGERY — TRANSFER, TENDON
Anesthesia: General | Site: Thumb | Laterality: Left | Wound class: Clean

## 2012-06-24 MED ORDER — DOCUSATE SODIUM 100 MG PO CAPS: 100.0000 mg | ORAL_CAPSULE | Freq: Two times a day (BID) | ORAL | Status: DC

## 2012-06-24 MED ORDER — DOCUSATE SODIUM 100 MG PO CAPS
100.0000 mg | ORAL_CAPSULE | Freq: Two times a day (BID) | ORAL | Status: DC
  Administered 2012-06-24 – 2012-06-28 (×8): 100 mg via ORAL
  Filled 2012-06-24 (×9): qty 1

## 2012-06-24 MED ORDER — ALBUTEROL SULFATE (5 MG/ML) 0.5% IN NEBU: 2.5000 mg | INHALATION_SOLUTION | Freq: Four times a day (QID) | RESPIRATORY_TRACT | Status: DC | PRN

## 2012-06-24 MED ORDER — QUETIAPINE FUMARATE 200 MG PO TABS
400.0000 mg | ORAL_TABLET | Freq: Every day | ORAL | Status: DC
  Administered 2012-06-24 – 2012-06-27 (×4): 400 mg via ORAL
  Filled 2012-06-24 (×5): qty 2

## 2012-06-24 MED ORDER — LACTATED RINGERS IV SOLN
INTRAVENOUS | Status: DC | PRN
  Administered 2012-06-24: 13:00:00 via INTRAVENOUS

## 2012-06-24 MED ORDER — METHOCARBAMOL 500 MG PO TABS
500.0000 mg | ORAL_TABLET | Freq: Four times a day (QID) | ORAL | Status: DC | PRN
  Administered 2012-06-24 – 2012-06-27 (×5): 500 mg via ORAL
  Filled 2012-06-24 (×5): qty 1

## 2012-06-24 MED ORDER — WARFARIN - PHARMACIST DOSING INPATIENT: Freq: Every day | Status: DC

## 2012-06-24 MED ORDER — LACTATED RINGERS IV SOLN
INTRAVENOUS | Status: DC
  Administered 2012-06-24: 12:00:00 via INTRAVENOUS

## 2012-06-24 MED ORDER — IPRATROPIUM BROMIDE 0.02 % IN SOLN: 0.5000 mg | Freq: Three times a day (TID) | RESPIRATORY_TRACT | Status: DC | PRN

## 2012-06-24 MED ORDER — ONDANSETRON HCL 4 MG PO TABS: 4.0000 mg | ORAL_TABLET | Freq: Four times a day (QID) | ORAL | Status: DC | PRN

## 2012-06-24 MED ORDER — GABAPENTIN 100 MG PO CAPS
100.0000 mg | ORAL_CAPSULE | Freq: Three times a day (TID) | ORAL | Status: DC
  Administered 2012-06-24 – 2012-06-28 (×10): 100 mg via ORAL
  Filled 2012-06-24 (×13): qty 1

## 2012-06-24 MED ORDER — ONDANSETRON HCL 4 MG/2ML IJ SOLN
INTRAMUSCULAR | Status: DC | PRN
  Administered 2012-06-24: 4 mg via INTRAVENOUS

## 2012-06-24 MED ORDER — MORPHINE SULFATE 2 MG/ML IJ SOLN: 1.0000 mg | INTRAMUSCULAR | Status: DC | PRN

## 2012-06-24 MED ORDER — BUPIVACAINE-EPINEPHRINE PF 0.5-1:200000 % IJ SOLN
INTRAMUSCULAR | Status: DC | PRN
  Administered 2012-06-24: 150 mg

## 2012-06-24 MED ORDER — WARFARIN SODIUM 7.5 MG PO TABS
7.5000 mg | ORAL_TABLET | Freq: Once | ORAL | Status: DC
  Filled 2012-06-24: qty 1

## 2012-06-24 MED ORDER — FENTANYL CITRATE 0.05 MG/ML IJ SOLN
INTRAMUSCULAR | Status: DC | PRN
  Administered 2012-06-24: 50 ug via INTRAVENOUS

## 2012-06-24 MED ORDER — HYDROMORPHONE HCL PF 1 MG/ML IJ SOLN: 0.2500 mg | INTRAMUSCULAR | Status: DC | PRN

## 2012-06-24 MED ORDER — FENTANYL CITRATE 0.05 MG/ML IJ SOLN
INTRAMUSCULAR | Status: AC
  Administered 2012-06-24: 100 ug
  Filled 2012-06-24: qty 2

## 2012-06-24 MED ORDER — CEFAZOLIN SODIUM 10 G IJ SOLR
3.0000 g | INTRAMUSCULAR | Status: AC
  Administered 2012-06-24: 3 g via INTRAVENOUS

## 2012-06-24 MED ORDER — MIDAZOLAM HCL 2 MG/2ML IJ SOLN
INTRAMUSCULAR | Status: AC
  Administered 2012-06-24: 2 mg
  Filled 2012-06-24: qty 2

## 2012-06-24 MED ORDER — LAMOTRIGINE 200 MG PO TABS
200.0000 mg | ORAL_TABLET | Freq: Every morning | ORAL | Status: DC
  Administered 2012-06-24 – 2012-06-28 (×5): 200 mg via ORAL
  Filled 2012-06-24 (×5): qty 1

## 2012-06-24 MED ORDER — CHLORHEXIDINE GLUCONATE 4 % EX LIQD: 60.0000 mL | Freq: Once | CUTANEOUS | Status: DC

## 2012-06-24 MED ORDER — SERTRALINE HCL 100 MG PO TABS
200.0000 mg | ORAL_TABLET | Freq: Every morning | ORAL | Status: DC
  Administered 2012-06-24 – 2012-06-28 (×5): 200 mg via ORAL
  Filled 2012-06-24 (×5): qty 2

## 2012-06-24 MED ORDER — MIDAZOLAM HCL 5 MG/5ML IJ SOLN
INTRAMUSCULAR | Status: DC | PRN
  Administered 2012-06-24: 2 mg via INTRAVENOUS

## 2012-06-24 MED ORDER — DIPHENHYDRAMINE HCL 25 MG PO CAPS
25.0000 mg | ORAL_CAPSULE | Freq: Four times a day (QID) | ORAL | Status: DC | PRN
  Filled 2012-06-24: qty 2

## 2012-06-24 MED ORDER — METHOCARBAMOL 100 MG/ML IJ SOLN
500.0000 mg | Freq: Four times a day (QID) | INTRAVENOUS | Status: DC | PRN
  Filled 2012-06-24: qty 5

## 2012-06-24 MED ORDER — ARTIFICIAL TEARS OP OINT
TOPICAL_OINTMENT | OPHTHALMIC | Status: DC | PRN
  Administered 2012-06-24: 1 via OPHTHALMIC

## 2012-06-24 MED ORDER — CEFAZOLIN SODIUM 1-5 GM-% IV SOLN: 1.0000 g | INTRAVENOUS | Status: DC

## 2012-06-24 MED ORDER — OXYCODONE HCL 5 MG PO TABS
5.0000 mg | ORAL_TABLET | ORAL | Status: DC | PRN
  Administered 2012-06-24 – 2012-06-27 (×9): 10 mg via ORAL
  Filled 2012-06-24 (×9): qty 2

## 2012-06-24 MED ORDER — OXYCODONE-ACETAMINOPHEN 10-325 MG PO TABS: 1.0000 | ORAL_TABLET | ORAL | Status: DC | PRN

## 2012-06-24 MED ORDER — VITAMIN C 500 MG PO TABS
500.0000 mg | ORAL_TABLET | Freq: Every day | ORAL | Status: DC
  Filled 2012-06-24: qty 1

## 2012-06-24 MED ORDER — ENOXAPARIN SODIUM 100 MG/ML ~~LOC~~ SOLN
100.0000 mg | Freq: Two times a day (BID) | SUBCUTANEOUS | Status: DC
  Administered 2012-06-24 – 2012-06-28 (×8): 100 mg via SUBCUTANEOUS
  Filled 2012-06-24 (×9): qty 1

## 2012-06-24 MED ORDER — ONDANSETRON HCL 4 MG/2ML IJ SOLN: 4.0000 mg | Freq: Four times a day (QID) | INTRAMUSCULAR | Status: DC | PRN

## 2012-06-24 MED ORDER — PROPOFOL 10 MG/ML IV BOLUS
INTRAVENOUS | Status: DC | PRN
  Administered 2012-06-24: 30 mg via INTRAVENOUS
  Administered 2012-06-24: 170 mg via INTRAVENOUS

## 2012-06-24 MED ORDER — ADULT MULTIVITAMIN W/MINERALS CH
1.0000 | ORAL_TABLET | Freq: Every day | ORAL | Status: DC
  Administered 2012-06-24 – 2012-06-28 (×5): 1 via ORAL
  Filled 2012-06-24 (×6): qty 1

## 2012-06-24 MED ORDER — LORAZEPAM 1 MG PO TABS: 1.0000 mg | ORAL_TABLET | Freq: Two times a day (BID) | ORAL | Status: DC

## 2012-06-24 MED ORDER — 0.9 % SODIUM CHLORIDE (POUR BTL) OPTIME
TOPICAL | Status: DC | PRN
  Administered 2012-06-24: 1000 mL

## 2012-06-24 MED ORDER — SIMVASTATIN 20 MG PO TABS
20.0000 mg | ORAL_TABLET | Freq: Every day | ORAL | Status: DC
  Administered 2012-06-24 – 2012-06-27 (×4): 20 mg via ORAL
  Filled 2012-06-24 (×5): qty 1

## 2012-06-24 MED ORDER — CEFAZOLIN SODIUM 1-5 GM-% IV SOLN
1.0000 g | Freq: Three times a day (TID) | INTRAVENOUS | Status: DC
  Administered 2012-06-24 – 2012-06-28 (×12): 1 g via INTRAVENOUS
  Filled 2012-06-24 (×14): qty 50

## 2012-06-24 MED ORDER — PANTOPRAZOLE SODIUM 40 MG PO TBEC
40.0000 mg | DELAYED_RELEASE_TABLET | Freq: Every day | ORAL | Status: DC
  Administered 2012-06-24 – 2012-06-28 (×5): 40 mg via ORAL
  Filled 2012-06-24 (×4): qty 1

## 2012-06-24 MED ORDER — KCL IN DEXTROSE-NACL 20-5-0.45 MEQ/L-%-% IV SOLN
INTRAVENOUS | Status: DC
  Administered 2012-06-24 – 2012-06-28 (×2): via INTRAVENOUS
  Filled 2012-06-24 (×6): qty 1000

## 2012-06-24 MED ORDER — VITAMIN C 500 MG PO TABS: 500.0000 mg | ORAL_TABLET | Freq: Every day | ORAL | Status: DC

## 2012-06-24 MED ORDER — VITAMIN C 500 MG PO TABS
1000.0000 mg | ORAL_TABLET | Freq: Every day | ORAL | Status: DC
  Administered 2012-06-24 – 2012-06-28 (×5): 1000 mg via ORAL
  Filled 2012-06-24 (×6): qty 2

## 2012-06-24 MED ORDER — DEXTROSE 5 % IV SOLN
3.0000 g | INTRAVENOUS | Status: DC
  Filled 2012-06-24: qty 3000

## 2012-06-24 MED ORDER — HYDROCODONE-ACETAMINOPHEN 7.5-325 MG PO TABS
1.0000 | ORAL_TABLET | ORAL | Status: DC | PRN
  Administered 2012-06-26: 2 via ORAL
  Filled 2012-06-24: qty 2

## 2012-06-24 MED ORDER — PROMETHAZINE HCL 25 MG/ML IJ SOLN: 6.2500 mg | INTRAMUSCULAR | Status: DC | PRN

## 2012-06-24 MED ORDER — LORAZEPAM 1 MG PO TABS
1.0000 mg | ORAL_TABLET | Freq: Every day | ORAL | Status: DC
  Administered 2012-06-25 – 2012-06-28 (×4): 1 mg via ORAL
  Filled 2012-06-24 (×2): qty 1

## 2012-06-24 MED ORDER — QUETIAPINE FUMARATE 400 MG PO TABS
400.0000 mg | ORAL_TABLET | Freq: Every day | ORAL | Status: DC
  Filled 2012-06-24: qty 1

## 2012-06-24 MED ORDER — LORAZEPAM 1 MG PO TABS
2.0000 mg | ORAL_TABLET | Freq: Every day | ORAL | Status: DC
  Administered 2012-06-24 – 2012-06-27 (×4): 2 mg via ORAL
  Filled 2012-06-24: qty 2
  Filled 2012-06-24: qty 1
  Filled 2012-06-24 (×4): qty 2

## 2012-06-24 SURGICAL SUPPLY — 51 items
BANDAGE ELASTIC 3 VELCRO ST LF (GAUZE/BANDAGES/DRESSINGS) ×4 IMPLANT
BANDAGE ELASTIC 4 VELCRO ST LF (GAUZE/BANDAGES/DRESSINGS) IMPLANT
BANDAGE GAUZE ELAST BULKY 4 IN (GAUZE/BANDAGES/DRESSINGS) ×4 IMPLANT
BNDG COHESIVE 1X5 TAN STRL LF (GAUZE/BANDAGES/DRESSINGS) IMPLANT
BNDG ELASTIC 2 VLCR STRL LF (GAUZE/BANDAGES/DRESSINGS) ×2 IMPLANT
BNDG ESMARK 4X9 LF (GAUZE/BANDAGES/DRESSINGS) ×2 IMPLANT
CANISTER SUCTION 2500CC (MISCELLANEOUS) ×2 IMPLANT
CLOTH BEACON ORANGE TIMEOUT ST (SAFETY) ×2 IMPLANT
CORDS BIPOLAR (ELECTRODE) ×2 IMPLANT
COVER SURGICAL LIGHT HANDLE (MISCELLANEOUS) ×2 IMPLANT
CUFF TOURNIQUET SINGLE 18IN (TOURNIQUET CUFF) IMPLANT
CUFF TOURNIQUET SINGLE 24IN (TOURNIQUET CUFF) ×2 IMPLANT
DRAPE SURG 17X23 STRL (DRAPES) ×2 IMPLANT
DRSG ADAPTIC 3X8 NADH LF (GAUZE/BANDAGES/DRESSINGS) IMPLANT
DRSG EMULSION OIL 3X3 NADH (GAUZE/BANDAGES/DRESSINGS) ×2 IMPLANT
GAUZE SPONGE 2X2 8PLY STRL LF (GAUZE/BANDAGES/DRESSINGS) IMPLANT
GLOVE BIOGEL PI IND STRL 8.5 (GLOVE) ×1 IMPLANT
GLOVE BIOGEL PI INDICATOR 8.5 (GLOVE) ×1
GLOVE SURG ORTHO 8.0 STRL STRW (GLOVE) ×2 IMPLANT
GOWN PREVENTION PLUS XLARGE (GOWN DISPOSABLE) ×2 IMPLANT
GOWN STRL NON-REIN LRG LVL3 (GOWN DISPOSABLE) ×4 IMPLANT
KIT BASIN OR (CUSTOM PROCEDURE TRAY) ×2 IMPLANT
KIT ROOM TURNOVER OR (KITS) ×2 IMPLANT
MANIFOLD NEPTUNE II (INSTRUMENTS) IMPLANT
NEEDLE HYPO 25GX1X1/2 BEV (NEEDLE) IMPLANT
NS IRRIG 1000ML POUR BTL (IV SOLUTION) ×2 IMPLANT
PACK ORTHO EXTREMITY (CUSTOM PROCEDURE TRAY) ×2 IMPLANT
PAD ARMBOARD 7.5X6 YLW CONV (MISCELLANEOUS) ×4 IMPLANT
PAD CAST 3X4 CTTN HI CHSV (CAST SUPPLIES) ×1 IMPLANT
PAD CAST 4YDX4 CTTN HI CHSV (CAST SUPPLIES) IMPLANT
PADDING CAST COTTON 2X4 NS (CAST SUPPLIES) ×2 IMPLANT
PADDING CAST COTTON 3X4 STRL (CAST SUPPLIES) ×1
PADDING CAST COTTON 4X4 STRL (CAST SUPPLIES)
PADDING UNDERCAST 2  STERILE (CAST SUPPLIES) ×2 IMPLANT
SOAP 2 % CHG 4 OZ (WOUND CARE) ×2 IMPLANT
SPECIMEN JAR SMALL (MISCELLANEOUS) IMPLANT
SPLINT FIBERGLASS 3X12 (CAST SUPPLIES) ×4 IMPLANT
SPONGE GAUZE 2X2 STER 10/PKG (GAUZE/BANDAGES/DRESSINGS)
SPONGE GAUZE 4X4 12PLY (GAUZE/BANDAGES/DRESSINGS) ×4 IMPLANT
SUCTION FRAZIER TIP 10 FR DISP (SUCTIONS) ×2 IMPLANT
SUT FIBER WIRE 4.0 (SUTURE) ×4 IMPLANT
SUT MERSILENE 4 0 P 3 (SUTURE) IMPLANT
SUT PROLENE 4 0 PS 2 18 (SUTURE) ×4 IMPLANT
SUT VIC AB 2-0 CT1 27 (SUTURE)
SUT VIC AB 2-0 CT1 TAPERPNT 27 (SUTURE) IMPLANT
SYR CONTROL 10ML LL (SYRINGE) IMPLANT
TOWEL OR 17X24 6PK STRL BLUE (TOWEL DISPOSABLE) ×2 IMPLANT
TOWEL OR 17X26 10 PK STRL BLUE (TOWEL DISPOSABLE) ×2 IMPLANT
TUBE CONNECTING 12X1/4 (SUCTIONS) ×2 IMPLANT
UNDERPAD 30X30 INCONTINENT (UNDERPADS AND DIAPERS) ×4 IMPLANT
WATER STERILE IRR 1000ML POUR (IV SOLUTION) ×2 IMPLANT

## 2012-06-24 NOTE — Brief Op Note (Signed)
06/24/2012  12:43 PM  PATIENT:  [name]  67 y.o. female  PRE-OPERATIVE DIAGNOSIS:  Left Thumb EPL Tendon Rupture  POST-OPERATIVE DIAGNOSIS:  SAME  PROCEDURE:  Procedure(s): LEFT THUMB EIP TO EPL TENDON TRANSFER (Left)  SURGEON:  Surgeon(s) and Role:    * Sharma Covert, MD - Primary  PHYSICIAN ASSISTANT:   ASSISTANTS: none   ANESTHESIA:   general  EBL:     BLOOD ADMINISTERED:none  DRAINS: none   LOCAL MEDICATIONS USED:  MARCAINE     SPECIMEN:  No Specimen  DISPOSITION OF SPECIMEN:  N/A  COUNTS:  YES  TOURNIQUET:    DICTATION: .161096  PLAN OF CARE: Admit for overnight observation  PATIENT DISPOSITION:  PACU - hemodynamically stable.   Delay start of Pharmacological VTE agent (>24hrs) due to surgical blood loss or risk of bleeding: not applicable

## 2012-06-24 NOTE — H&P (Signed)
[name] is an 67 y.o. female.   Chief Complaint: unable to extend left thumb HPI: pt sustained closed left distal radius fracture and treated in cast Pt after healed fracture noticed inablilty to extend thumb Pt here for surgery on left thumb to correct inability to use thumb Pt has been followed in office  Past Medical History  Diagnosis Date  . Chronic obstructive pulmonary disease   . Depression     followed by Dr. Gwyndolyn Kaufman, [facility]  . Gastroesophageal reflux disease   . Hypertension   . Hyperlipidemia   . Interstitial cystitis     followed by Dr. Isabel Caprice  . Irritable bowel syndrome     Colonoscopy 12/2003 and EGD 1997 WNL  . Spinal stenosis     L3-L4, L4-L5 per MRI 12/2004, s/p decompression 02/2007 by Dr. Gerlene Fee, residual chronic low back pain  . 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 Obstructive sleep apnea     Noncompliant with CPAP "ineffective"  . Basal cell carcinoma 09/2001    excised from nose  . Breast cancer     Bilateral s/p mastectomies  . Arthritis   . Anxiety   . 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 Elnoria Howard  . History of blood transfusion     during GAVE associated GI bleed  . Cataract     Left eye pending surgery  . Clotting disorder 2009    LLE DVT and PE (1 month apart)  . Fibromyalgia   . Incontinent of urine     at night  . Complication of anesthesia     Hard time putting patient to sleep  . Insomnia   . Pneumonia     hx of  . Shortness of breath   . Edema leg     left    Past Surgical History  Procedure Laterality Date  . Cervical discectomy  11/1995 and 05/2002    by Dr. Gerlene Fee  . Abdominal hysterectomy  1976  . Bladder suspension  1993  . Orif ankle fracture  1993  . Lumbar laminectomy/decompression microdiscectomy  02/2007    by Dr. Gerlene Fee  . Carpal tunnel release      LEFT  . Tonsillectomy and adenoidectomy  1953  . Appendectomy    . Dilation and curettage of uterus    . Tubal  ligation    . Carpectomy  07/23/2011    Procedure: CARPECTOMY;  Surgeon: Sharma Covert, MD;  Location: [facility] OR;  Service: Orthopedics;  Laterality: Left;  LEFT WRIST PROXIMAL ROW CARPECTOMY  . Esophagogastroduodenoscopy  12/25/2011    Procedure: ESOPHAGOGASTRODUODENOSCOPY (EGD);  Surgeon: Theda Belfast, MD;  Location: [facility] ENDOSCOPY;  Service: Endoscopy;  Laterality: N/A;  . Breast surgery  1992    Bilateral mastectomies for breast cancer  . Rhinoplasty  1993  . Spine surgery    . Hot hemostasis  02/06/2012    Procedure: HOT HEMOSTASIS (ARGON PLASMA COAGULATION/BICAP);  Surgeon: Theda Belfast, MD;  Location: [facility] ENDOSCOPY;  Service: Endoscopy;  Laterality: N/A;  . Esophagogastroduodenoscopy  02/06/2012    Procedure: ESOPHAGOGASTRODUODENOSCOPY (EGD);  Surgeon: Theda Belfast, MD;  Location: [facility] ENDOSCOPY;  Service: Endoscopy;  Laterality: N/A;  . Esophagogastroduodenoscopy N/A 04/02/2012    Procedure: ESOPHAGOGASTRODUODENOSCOPY (EGD);  Surgeon: Theda Belfast, MD;  Location: [facility] ENDOSCOPY;  Service: Endoscopy;  Laterality: N/A;  EGD w/ APC  . Back surgery      x 3  Family History  Problem Relation Age of Onset  . Heart disease Mother   . Deep vein thrombosis Mother   . Cancer - Lung Brother   . Alcohol abuse Father   . Schizophrenia Father   . Anxiety disorder Son   . Healthy Brother   . Healthy Brother    Social History:  reports that she has been smoking Cigarettes.  She has a 40 pack-year smoking history. She has never used smokeless tobacco. She reports that she does not drink alcohol or use illicit drugs.  Allergies:  Allergies  Allergen Reactions  . Adhesive (Tape) Other (See Comments)    "I break out all over"; Can only use paper tape  . Codeine Other (See Comments)    "first dose I took, my aide found me the next day laying on the floor"  . Morphine Other (See Comments)    caused change in mental status, she does not want to try it again.  . Sulfonamide Derivatives Nausea And  Vomiting    Medications Prior to Admission  Medication Sig Dispense Refill  . albuterol (PROVENTIL) (5 MG/ML) 0.5% nebulizer solution Take 2.5 mg by nebulization every 6 (six) hours as needed for wheezing or shortness of breath.      . enoxaparin (LOVENOX) 100 MG/ML injection Inject 1 mL (100 mg total) into the skin every 12 (twelve) hours.  60 Syringe  2  . ferrous sulfate 325 (65 FE) MG EC tablet Take 325 mg by mouth 3 (three) times daily with meals.      . gabapentin (NEURONTIN) 100 MG capsule Take 100 mg by mouth 3 (three) times daily.       Marland Kitchen ipratropium (ATROVENT) 0.02 % nebulizer solution Take 0.5 mg by nebulization 3 (three) times daily as needed for wheezing.      . lamoTRIgine (LAMICTAL) 200 MG tablet Take 200 mg by mouth every morning.      Marland Kitchen LORazepam (ATIVAN) 1 MG tablet Take 1-2 mg by mouth 2 (two) times daily. Take one tablet in morning and two tablets by mouth at bedtime      . pantoprazole (PROTONIX) 40 MG tablet Take 40 mg by mouth daily.      . pravastatin (PRAVACHOL) 40 MG tablet Take 40 mg by mouth every morning.      Marland Kitchen QUEtiapine (SEROQUEL) 200 MG tablet Take 400 mg by mouth at bedtime.       . sertraline (ZOLOFT) 100 MG tablet Take 200 mg by mouth every morning.       . vitamin C (ASCORBIC ACID) 500 MG tablet Take 1 tablet (500 mg total) by mouth daily.  90 tablet  0    Results for orders placed during the hospital encounter of 06/24/12 (from the past 48 hour(s))  APTT     Status: None   Collection Time    06/24/12 10:39 AM      Result Value Range   aPTT 28  24 - 37 seconds  PROTIME-INR     Status: None   Collection Time    06/24/12 10:39 AM      Result Value Range   Prothrombin Time 13.2  11.6 - 15.2 seconds   INR 1.01  0.00 - 1.49   No results found.  NO RECENT ILLNESSES OR HOSPITALIZATIONS  Blood pressure 126/68, pulse 72, temperature 98.3 F (36.8 C), temperature source Oral, resp. rate 15, SpO2 96.00%. General Appearance:  Alert, cooperative, no  distress, appears stated age  Head:  Normocephalic,  without obvious abnormality, atraumatic  Eyes:  Pupils equal, conjunctiva/corneas clear,         Throat: Lips, mucosa, and tongue normal; teeth and gums normal  Neck: No visible masses     Lungs:   respirations unlabored  Chest Wall:  No tenderness or deformity  Heart:  Regular rate and rhythm,  Abdomen:   Soft, non-tender, obese        Extremities: Left wrist able to extend index finger with long and ring finger flexed. Unable to extend thumb, no functioning epl tendon Fingers warm well perfused  Pulses: 2+ and symmetric  Skin: Skin color, texture, turgor normal, no rashes or lesions     Neurologic: Normal    Assessment/Plan Left thumb epl tendon rupture  Left hand tendon transfer eip to epl  R/B/A DISCUSSED WITH PT IN OFFICE.  PT VOICED UNDERSTANDING OF PLAN CONSENT SIGNED DAY OF SURGERY PT SEEN AND EXAMINED PRIOR TO OPERATIVE PROCEDURE/DAY OF SURGERY SITE MARKED. QUESTIONS ANSWERED WILL remain as observation patient FOLLOWING SURGERY  Sharma Covert 06/24/2012, 12:40 PM

## 2012-06-24 NOTE — Anesthesia Procedure Notes (Addendum)
Anesthesia Regional Block:  Interscalene brachial plexus block  Pre-Anesthetic Checklist: ,, timeout performed, Correct Patient, Correct Site, Correct Laterality, Correct Procedure, Correct Position, site marked, Risks and benefits discussed,  Surgical consent,  Pre-op evaluation,  At surgeon's request and post-op pain management  Laterality: Left  Prep: chloraprep       Needles:  Injection technique: Single-shot  Needle Type: Echogenic Stimulator Needle     Needle Length: 5cm 5 cm Needle Gauge: 22 and 22 G    Additional Needles:  Procedures: ultrasound guided (picture in chart) and nerve stimulator Interscalene brachial plexus block  Nerve Stimulator or Paresthesia:  Response: bicep contraction, 0.48 mA,   Additional Responses:   Narrative:  Start time: 06/24/2012 12:28 PM End time: 06/24/2012 12:38 PM Injection made incrementally with aspirations every 5 mL.  Performed by: Personally   Additional Notes: Functioning IV was confirmed and monitors applied.  A 50mm 22ga echogenic arrow stimulator was used. Sterile prep and drape,hand hygiene and sterile gloves were used.Ultrasound guidance: relevent anatomy identified, needle position confirmed, local anesthetic spread visualized around nerve(s)., vascular puncture avoided.  Image printed for medical record.  Negative aspiration and negative test dose prior to incremental administration of local anesthetic. The patient tolerated the procedure well.  Interscalene brachial plexus block

## 2012-06-24 NOTE — Progress Notes (Signed)
Received patient from PACU.  Patient alert and orientedx4, vitals stable.  Patient denies pain.  Pain oriented to room and unit.  Patient states she brought her wheelchair to the hospital and that the last place it was seen was Short Stay.  Short Stay called and are unable to find the wheelchair.  Patient denies that family took the wheelchair; she states no family came to the hospital with her.  Security was notified and filed a report for the patient.  Will continue to monitor.

## 2012-06-24 NOTE — Preoperative (Signed)
Beta Blockers   Reason not to administer Beta Blockers:Not Applicable 

## 2012-06-24 NOTE — Progress Notes (Signed)
ANTICOAGULATION CONSULT NOTE - Initial Consult  Pharmacy Consult for Coumadin  Indication: h/o PE/ L DVT   Allergies  Allergen Reactions  . Adhesive (Tape) Other (See Comments)    "I break out all over"; Can only use paper tape  . Codeine Other (See Comments)    "first dose I took, my aide found me the next day laying on the floor"  . Morphine Other (See Comments)    caused change in mental status, she does not want to try it again.  . Sulfonamide Derivatives Nausea And Vomiting    Patient Measurements: Height: 5' 6.93" (170 cm) (from 06/18/12) Weight: 330 lb 0.5 oz (149.7 kg) (from 06/18/12) IBW/kg (Calculated) : 61.44   Vital Signs: Temp: 97.7 F (36.5 C) (06/05 1530) Temp src: Oral (06/05 1042) BP: 127/58 mmHg (06/05 1527) Pulse Rate: 71 (06/05 1527)  Labs:  Recent Labs  06/24/12 1039  APTT 28  LABPROT 13.2  INR 1.01    Estimated Creatinine Clearance: 104.2 ml/min (by C-G formula based on Cr of 0.7).   Medical History: Past Medical History  Diagnosis Date  . Chronic obstructive pulmonary disease   . Depression     followed by Dr. Gwyndolyn Kaufman, [facility]  . Gastroesophageal reflux disease   . Hypertension   . Hyperlipidemia   . Interstitial cystitis     followed by Dr. Isabel Caprice  . Irritable bowel syndrome     Colonoscopy 12/2003 and EGD 1997 WNL  . Spinal stenosis     L3-L4, L4-L5 per MRI 12/2004, s/p decompression 02/2007 by Dr. Gerlene Fee, residual chronic low back pain  . 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 Obstructive sleep apnea     Noncompliant with CPAP "ineffective"  . Basal cell carcinoma 09/2001    excised from nose  . Breast cancer     Bilateral s/p mastectomies  . Arthritis   . Anxiety   . 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 Elnoria Howard  . History of blood transfusion     during GAVE associated GI bleed  . Cataract     Left eye pending surgery  . Clotting disorder 2009    LLE DVT and PE (1 month apart)  .  Fibromyalgia   . Incontinent of urine     at night  . Complication of anesthesia     Hard time putting patient to sleep  . Insomnia   . Pneumonia     hx of  . Shortness of breath   . Edema leg     left    Medications:  Prescriptions prior to admission  Medication Sig Dispense Refill  . albuterol (PROVENTIL) (5 MG/ML) 0.5% nebulizer solution Take 2.5 mg by nebulization every 6 (six) hours as needed for wheezing or shortness of breath.      . enoxaparin (LOVENOX) 100 MG/ML injection Inject 1 mL (100 mg total) into the skin every 12 (twelve) hours.  60 Syringe  2  . ferrous sulfate 325 (65 FE) MG EC tablet Take 325 mg by mouth 3 (three) times daily with meals.      . gabapentin (NEURONTIN) 100 MG capsule Take 100 mg by mouth 3 (three) times daily.       Marland Kitchen ipratropium (ATROVENT) 0.02 % nebulizer solution Take 0.5 mg by nebulization 3 (three) times daily as needed for wheezing.      . lamoTRIgine (LAMICTAL) 200 MG tablet Take 200 mg by mouth every morning.      Marland Kitchen  LORazepam (ATIVAN) 1 MG tablet Take 1-2 mg by mouth 2 (two) times daily. Take one tablet in morning and two tablets by mouth at bedtime      . pantoprazole (PROTONIX) 40 MG tablet Take 40 mg by mouth daily.      . pravastatin (PRAVACHOL) 40 MG tablet Take 40 mg by mouth every morning.      Marland Kitchen QUEtiapine (SEROQUEL) 200 MG tablet Take 400 mg by mouth at bedtime.       . sertraline (ZOLOFT) 100 MG tablet Take 200 mg by mouth every morning.       . vitamin C (ASCORBIC ACID) 500 MG tablet Take 1 tablet (500 mg total) by mouth daily.  90 tablet  0   Scheduled:  . gabapentin  100 mg Oral TID  . lamoTRIgine  200 mg Oral q morning - 10a  . [START ON 06/25/2012] LORazepam  1 mg Oral Daily  . LORazepam  2 mg Oral QHS  . pantoprazole  40 mg Oral Daily  . QUEtiapine  400 mg Oral QHS  . sertraline  200 mg Oral q morning - 10a  . simvastatin  20 mg Oral q1800  . vitamin C  500 mg Oral Daily    Assessment: 67 y.o morbidly obese female with  h/o PE/ L. DVT on lifelong coumadin prior to this admit.  Her coumadin was held 5 days preop and she was started on Lovenox 100 mg SQ q12h while off coumadin per Dr. Gaylyn Rong, her hematogist. The patient is now s/p left thumb tendon transfer due to left thumb tendon rupture s/p healed left distal radius fracture.   Today the INR = 1.01  , no CBC available.  PTA Coumadin dose was 2mg  alternating every other day with 4mg . Last taken ~06/16/12.   Pharmacist outpatient note on 06/17/12 indicated Dr. Gaylyn Rong, her hematologist,  stated she is not a candidate for xarelto with hx of GI bleeds and monthly procedures.    PE '09, DVT 05/2010, prior cervical and lumbar spine surgeries. 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 Elnoria Howard and further treated with [facility] in January and March 2014.)      Goal of Therapy:  INR 2-3 Monitor platelets by anticoagulation protocol: Yes   Plan:  Resume Lovenox 100 mg SQ q12h (PTA dose per hematologist)  Coumadin 7.5 mg today x1  INR qAM and CBC in AM.  Noah Delaine, RPh Clinical Pharmacist Pager: [phone] 06/24/2012,4:02 PM

## 2012-06-24 NOTE — Anesthesia Postprocedure Evaluation (Signed)
Anesthesia Post Note  Patient: [name]  Procedure(s) Performed: Procedure(s) (LRB): LEFT THUMB EIP TO EPL TENDON TRANSFER (Left)  Anesthesia type: general  Patient location: PACU  Post pain: Pain level controlled  Post assessment: Patient's Cardiovascular Status Stable  Last Vitals:  Filed Vitals:   06/24/12 1530  BP:   Pulse:   Temp: 36.5 C  Resp:     Post vital signs: Reviewed and stable  Level of consciousness: sedated  Complications: No apparent anesthesia complications

## 2012-06-24 NOTE — Transfer of Care (Signed)
Immediate Anesthesia Transfer of Care Note  Patient: [name]  Procedure(s) Performed: Procedure(s): LEFT THUMB EIP TO EPL TENDON TRANSFER (Left)  Patient Location: PACU  Anesthesia Type:General  Level of Consciousness: awake, alert , oriented and sedated  Airway & Oxygen Therapy: Patient Spontanous Breathing and Patient connected to nasal cannula oxygen  Post-op Assessment: Report given to PACU RN, Post -op Vital signs reviewed and stable and Patient moving all extremities  Post vital signs: Reviewed and stable  Complications: No apparent anesthesia complications

## 2012-06-25 ENCOUNTER — Encounter (HOSPITAL_COMMUNITY): Payer: Self-pay | Admitting: Orthopedic Surgery

## 2012-06-25 LAB — CBC
HCT: 44 % (ref 36.0–46.0)
MCV: 86.3 fL (ref 78.0–100.0)
RBC: 5.1 MIL/uL (ref 3.87–5.11)
RDW: 20.8 % — ABNORMAL HIGH (ref 11.5–15.5)
WBC: 6.4 10*3/uL (ref 4.0–10.5)

## 2012-06-25 NOTE — Clinical Social Work Placement (Addendum)
Clinical Social Work Department  CLINICAL SOCIAL WORK PLACEMENT NOTE  06/25/2012  Patient: [name]  Account Number:  [id number] Admit date: 06/24/12 Clinical Social Worker: Sabino Niemann LCSWA Date/time: 06/25/2012  4:30 PM  Clinical Social Work is seeking post-discharge placement for this patient at the following level of care: SKILLED NURSING (*CSW will update this form in Epic as items are completed)  06/25/2012 Patient/family provided with [facility] Department of Clinical Social Work's list of facilities offering this level of care within the geographic area requested by the patient (or if unable, by the patient's family).  6/6/2014Patient/family informed of their freedom to choose among providers that offer the needed level of care, that participate in Medicare, Medicaid or managed care program needed by the patient, have an available bed and are willing to accept the patient.  6/6/2014Patient/family informed of [facility]' ownership interest in [facility], as well as of the fact that they are under no obligation to receive care at this facility.  PASARR submitted to EDS on Pre-existing  PASARR number received from EDS on  FL2 transmitted to all facilities in geographic area requested by pt/family on 06/25/2012  FL2 transmitted to all facilities within larger geographic area on  Patient informed that his/her managed care company has contracts with or will negotiate with certain facilities, including the following:  Patient/family informed of bed offers received:  06/28/2012 Patient chooses bed at [facility] Physician recommends and patient chooses bed at  Patient to be transferred to on 06/28/2012 Patient to be transferred to facility by [facility] The following physician request were entered in Epic:  Additional Comments:

## 2012-06-25 NOTE — Progress Notes (Signed)
Nutrition Brief Note  Patient identified on the Malnutrition Screening Tool (MST) Report  Body mass index is 51.8 kg/(m^2). Patient meets criteria for morbid obesity based on current BMI.   Current diet order is Regular, patient is consuming approximately 40% of meals at this time. Labs and medications reviewed.   Pt reports stable wt PTA with good intake.  Pt denies nutrition-related concerns. No nutrition interventions warranted at this time. If nutrition issues arise, please consult RD.   Loyce Dys, MS RD LDN Clinical Inpatient Dietitian Pager: [phone] Weekend/After hours pager: [phone]

## 2012-06-25 NOTE — Progress Notes (Signed)
UR COMPLETED  

## 2012-06-25 NOTE — Evaluation (Signed)
Occupational Therapy Evaluation Patient Details Name: [name] MRN: [id number] DOB: [birth year] Today's Date: 06/25/2012 Time: 4098-1191 OT Time Calculation (min): 69 min  OT Assessment / Plan / Recommendation Clinical Impression  Pt is a 67 yr old female admitted for left hand thumb extensor tendon repair.  Pt also with comorbidities of knee arthritis, and DVT as well.  Prior to admission pt needed assistance with bathing and some dressing tasks as well as homemanagement tasks.  She lived alone and had a home health aide for 3 hours a day, 7 days a week.  Currenlty needs min assist for ADLs, toileting, and  mobility.  Since pt lives alone and only has limited assistance feel she will benefit from short term SNF rehab to reach modified independent level for toileting and at least meal prep from a wheelchiar to walker level.  Do not feel she is quite yet safe enough to go home alone.    OT Assessment  Patient needs continued OT Services    Follow Up Recommendations  No OT follow up    Barriers to Discharge Decreased caregiver support    Equipment Recommendations  None recommended by OT       Frequency  Min 2X/week    Precautions / Restrictions Precautions Precautions: Fall Precaution Comments: elevate L UE pillows or mission sling Required Braces or Orthoses: Other Brace/Splint Other Brace/Splint: pt with cast on left forearm Restrictions Weight Bearing Restrictions: Yes LUE Weight Bearing: Non weight bearing Other Position/Activity Restrictions: platform walker L UE-weightbear through forearm   Pertinent Vitals/Pain Pain 3/10 in the left hand    ADL  Eating/Feeding: Simulated;Set up Where Assessed - Eating/Feeding: Chair Grooming: Performed;Supervision/safety Where Assessed - Grooming: Unsupported sitting Upper Body Bathing: Simulated;Minimal assistance Where Assessed - Upper Body Bathing: Unsupported sitting Lower Body Bathing: Simulated;Minimal assistance Where  Assessed - Lower Body Bathing: Supported sit to stand Upper Body Dressing: Simulated;Minimal assistance Where Assessed - Upper Body Dressing: Unsupported sitting Lower Body Dressing: Performed;Minimal assistance Where Assessed - Lower Body Dressing: Supported sit to stand Toilet Transfer: Performed;Minimal assistance Toilet Transfer Method: Other (comment) (ambulate with RW and platform on the left) Acupuncturist: Bedside commode Toileting - Clothing Manipulation and Hygiene: Simulated;Minimal assistance Where Assessed - Engineer, mining and Hygiene: Sit to stand from 3-in-1 or toilet Tub/Shower Transfer Method: Not assessed Equipment Used: Rolling walker;Gait belt Transfers/Ambulation Related to ADLs: Pt overall min assist level for mobility using the RW with the platform. ADL Comments: Pt overall min assist level for selfcare tasks and mobility at this point.  Does not have 24 hour supervision at home, only H[facility] 3 hrs per day.  Feel she will benefit from short stay at SNF rehab in order to reach modified independent status with wheelchair or platform RW for selfcare and toileting.  Feel she is a high fall risk based on co-morbidities and need for walker prior to session.  Only able to maintain standing for 2-3 mins before needing to sit stating "My knees are going to give out."    OT Diagnosis: Generalized weakness;Acute pain  OT Problem List: Decreased strength;Decreased range of motion;Decreased activity tolerance;Impaired balance (sitting and/or standing);Impaired UE functional use;Cardiopulmonary status limiting activity;Pain OT Treatment Interventions: Self-care/ADL training;Therapeutic activities;Therapeutic exercise;Energy conservation;Patient/family education;DME and/or AE instruction;Balance training   OT Goals Acute Rehab OT Goals OT Goal Formulation: With patient Time For Goal Achievement: 07/02/12 Potential to Achieve Goals: Good ADL Goals Pt Will  Perform Upper Body Bathing: with supervision;Unsupported;Sitting, edge of bed ADL Goal:  Upper Body Bathing - Progress: Goal set today Pt Will Perform Lower Body Bathing: with supervision;Supported;Sit to stand from bed ADL Goal: Lower Body Bathing - Progress: Goal set today Pt Will Perform Upper Body Dressing: with set-up;Unsupported;Sitting, bed ADL Goal: Upper Body Dressing - Progress: Goal set today Pt Will Perform Lower Body Dressing: with supervision;with adaptive equipment;Sit to stand from bed ADL Goal: Lower Body Dressing - Progress: Goal set today Pt Will Transfer to Toilet: with modified independence;with DME;Ambulation;3-in-1 ADL Goal: Toilet Transfer - Progress: Goal set today Pt Will Perform Toileting - Clothing Manipulation: with modified independence;Sitting on 3-in-1 or toilet;Standing ADL Goal: Toileting - Clothing Manipulation - Progress: Goal set today Pt Will Perform Toileting - Hygiene: with modified independence;Sit to stand from 3-in-1/toilet ADL Goal: Toileting - Hygiene - Progress: Goal set today Miscellaneous OT Goals Miscellaneous OT Goal #1: Pt will verbalize and demonstrate edema control strategies and AROM for non-involved digits of the LUE with independence. OT Goal: Miscellaneous Goal #1 - Progress: Goal set today  Visit Information  Last OT Received On: 06/25/12 Assistance Needed: +1    Subjective Data  Subjective: If I go to rehab, how long will I need to be there. Patient Stated Goal: Pt did not state.   Prior Functioning     Home Living Lives With: Alone Available Help at Discharge: Other (Comment) (Has aide 3 hrs a day for 7 days a week) Type of Home: Apartment Home Access: Ramped entrance Home Layout: One level Bathroom Shower/Tub: Tub/shower unit;Curtain Bathroom Toilet: Handicapped height Bathroom Accessibility: Yes Home Adaptive Equipment: Walker - rolling;Bedside commode/3-in-1;Straight cane;Shower chair with back;Other  (comment);Wheelchair - manual Additional Comments: used walker in the apartment, has a platform on left side as well Prior Function Level of Independence: Needs assistance Needs Assistance: Bathing;Dressing Bath: Minimal Dressing: Minimal Driving: No Vocation: Retired Musician: No difficulties Dominant Hand: Right         Vision/Perception Vision - History Baseline Vision: No visual deficits Patient Visual Report: No change from baseline Vision - Assessment Vision Assessment: Vision not tested Perception Perception: Within Functional Limits Praxis Praxis: Intact   Cognition  Cognition Arousal/Alertness: Awake/alert Behavior During Therapy: WFL for tasks assessed/performed Overall Cognitive Status: Within Functional Limits for tasks assessed    Extremity/Trunk Assessment Right Upper Extremity Assessment RUE ROM/Strength/Tone: Within functional levels RUE Sensation: WFL - Light Touch RUE Coordination: WFL - gross/fine motor Left Upper Extremity Assessment LUE ROM/Strength/Tone: Deficits LUE ROM/Strength/Tone Deficits: Pt with thumb spica cast on the left forearm extending just distally to the MPs of the other digits. LUE Sensation: Deficits LUE Sensation Deficits: pt with numbness in the tip of the thumb. Right Lower Extremity Assessment RLE ROM/Strength/Tone: Deficits RLE ROM/Strength/Tone Deficits: Strength at least 3+/5 for functional mobility Left Lower Extremity Assessment LLE ROM/Strength/Tone: Deficits LLE ROM/Strength/Tone Deficits: chronic arthritits L knee. Strength at least 3+/5 for functional mobility Trunk Assessment Trunk Assessment: Normal     Mobility Bed Mobility Bed Mobility: Supine to Sit Supine to Sit: 4: Min guard;HOB elevated;With rails Details for Bed Mobility Assistance: OT assisted pt to EOB Transfers Sit to Stand: 4: Min assist;From bed;From chair/3-in-1 Stand to Sit: 4: Min assist;To chair/3-in-1 Details for  Transfer Assistance: x 2. VCs safety technique hand placement. Assist to rise, stabilize, control descent.         Balance Balance Balance Assessed: Yes Static Standing Balance Static Standing - Level of Assistance: 5: Stand by assistance Dynamic Standing Balance Dynamic Standing - Balance Support: Right upper extremity supported;Left upper  extremity supported Dynamic Standing - Level of Assistance: 4: Min assist   End of Session OT - End of Session Equipment Utilized During Treatment: Gait belt Activity Tolerance: Patient limited by fatigue Patient left: in chair;with call bell/phone within reach Nurse Communication: Mobility status   Functional Assessment Tool Used: clinical judgement Functional Limitation: Self care Self Care Current Status (Z6109): At least 20 percent but less than 40 percent impaired, limited or restricted Self Care Goal Status (U0454): At least 1 percent but less than 20 percent impaired, limited or restricted   Alixis Harmon OTR/L Pager number [phone] 06/25/2012, 9:46 AM

## 2012-06-25 NOTE — Evaluation (Signed)
Physical Therapy Evaluation Patient Details Name: [name] MRN: [id number] DOB: 1945-10-23 Today's Date: 06/25/2012 Time: 4098-1191 PT Time Calculation (min): 35 min  PT Assessment / Plan / Recommendation Clinical Impression  67 yo female s/p L thumb extensor tendon transfer. Hx of closed L distal radius fx/inability to extend thumb, COPD, PE, spinal stenosis/lumbar laminectomy. Pt lives alone and has aide 3 hours/day. Pt with multiple co-morbidities affecting mobility/safety.On eval, pt required Min assist for all mobility. Do not feel pt is safe to d/c home alone at this time. Recommend SNF for continued rehab prior to returning home alone. If pt was to d/c home she would need 24 hour supervision/assist initially.     PT Assessment  Patient needs continued PT services    Follow Up Recommendations  SNF;Supervision/Assistance - 24 hour    Does the patient have the potential to tolerate intense rehabilitation      Barriers to Discharge        Equipment Recommendations  Wheelchair ;Wheelchair cushion (pt's wheelchair currently missing after it was brought here to hospital???)    Recommendations for Other Services OT consult   Frequency Min 3X/week    Precautions / Restrictions Precautions Precautions: Fall Precaution Comments: elevate L UE pillows or mission sling Required Braces or Orthoses: Other Brace/Splint Other Brace/Splint: pt with cast on left forearm Restrictions Weight Bearing Restrictions: Yes LUE Weight Bearing: Non weight bearing Other Position/Activity Restrictions: platform walker L UE-weightbear through forearm   Pertinent Vitals/Pain L UE-5/10       Mobility  Bed Mobility Bed Mobility: Not assessed Details for Bed Mobility Assistance: OT assisted pt to EOB Transfers Transfers: Sit to Stand;Stand to Sit Sit to Stand: 4: Min assist;From bed;From chair/3-in-1 Stand to Sit: 4: Min assist;To chair/3-in-1 Details for Transfer Assistance: x 2. VCs  safety technique hand placement. Assist to rise, stabilize, control descent.  Ambulation/Gait Ambulation/Gait Assistance: 4: Min assist Ambulation Distance (Feet): 30 Feet (15' x2) Assistive device: Left platform walker Ambulation/Gait Assistance Details: VCS safety, distance from RW. Slow gait speed. Assist to stabilize. Fatigues fairly easily.  Gait Pattern: Decreased stride length;Trunk flexed;Step-through pattern    Exercises     PT Diagnosis: Difficulty walking;Abnormality of gait;Generalized weakness  PT Problem List: Decreased strength;Decreased activity tolerance;Decreased mobility;Obesity;Decreased knowledge of use of DME;Decreased safety awareness;Decreased range of motion;Decreased balance PT Treatment Interventions: DME instruction;Gait training;Functional mobility training;Therapeutic activities;Therapeutic exercise;Balance training;Patient/family education;Wheelchair mobility training   PT Goals Acute Rehab PT Goals PT Goal Formulation: With patient Time For Goal Achievement: 07/09/12 Potential to Achieve Goals: Good Pt will go Supine/Side to Sit: with supervision PT Goal: Supine/Side to Sit - Progress: Goal set today Pt will go Sit to Supine/Side: with supervision PT Goal: Sit to Supine/Side - Progress: Goal set today Pt will go Sit to Stand: with supervision PT Goal: Sit to Stand - Progress: Goal set today Pt will Transfer Bed to Chair/Chair to Bed: with supervision PT Transfer Goal: Bed to Chair/Chair to Bed - Progress: Goal set today Pt will Ambulate: 51 - 150 feet;with supervision;with rolling walker PT Goal: Ambulate - Progress: Goal set today Pt will Propel Wheelchair: 51 - 150 feet;with supervision (with feet and R UE) PT Goal: Propel Wheelchair - Progress: Goal set today  Visit Information  Last PT Received On: 06/25/12 Assistance Needed: +1    Subjective Data  Subjective: why can't I just try it out...go home first? Patient Stated Goal: home   Prior  Functioning  Home Living Lives With: Alone Available Help at  Discharge: Other (Comment) (Has aide 3 hrs a day for 7 days a week) Type of Home: Apartment Home Access: Ramped entrance Home Layout: One level Bathroom Shower/Tub: Tub/shower unit;Curtain Bathroom Toilet: Handicapped height Bathroom Accessibility: Yes Home Adaptive Equipment: Walker - rolling;Bedside commode/3-in-1;Straight cane;Shower chair with back;Other (comment);Wheelchair - manual Additional Comments: used walker in the apartment, has a platform on left side as well Prior Function Level of Independence: Needs assistance Needs Assistance: Bathing;Dressing Bath: Minimal Dressing: Minimal Driving: No Vocation: Retired Musician: No difficulties Dominant Hand: Right    Cognition  Cognition Arousal/Alertness: Awake/alert Behavior During Therapy: WFL for tasks assessed/performed Overall Cognitive Status: Within Functional Limits for tasks assessed    Extremity/Trunk Assessment Right Lower Extremity Assessment RLE ROM/Strength/Tone: Deficits RLE ROM/Strength/Tone Deficits: Strength at least 3+/5 for functional mobility Left Lower Extremity Assessment LLE ROM/Strength/Tone: Deficits LLE ROM/Strength/Tone Deficits: chronic arthritits L knee. Strength at least 3+/5 for functional mobility   Balance    End of Session PT - End of Session Equipment Utilized During Treatment: Gait belt Activity Tolerance: Patient limited by fatigue Patient left: in chair;with call bell/phone within reach Nurse Communication: Mobility status;Weight bearing status  GP Functional Assessment Tool Used: clinical judgement Functional Limitation: Mobility: Walking and moving around Mobility: Walking and Moving Around Current Status (Z6109): At least 20 percent but less than 40 percent impaired, limited or restricted Mobility: Walking and Moving Around Goal Status [phone]): At least 1 percent but less than 20 percent impaired,  limited or restricted   Rebeca Alert, MPT Pager: [phone]

## 2012-06-25 NOTE — Clinical Social Work Placement (Deleted)
Clinical Social Work Department  CLINICAL SOCIAL WORK PLACEMENT NOTE  06/25/2012  Patient: [name] Account Number: [id number] Admit date: 06/24/12  Clinical Social Worker: Sabino Niemann LCSWA Date/time: 06/25/2012 4:30 PM  Clinical Social Work is seeking post-discharge placement for this patient at the following level of care: SKILLED NURSING (*CSW will update this form in Epic as items are completed)  06/25/2012 Patient/family provided with [facility] Department of Clinical Social Work's list of facilities offering this level of care within the geographic area requested by the patient (or if unable, by the patient's family).  6/6/2014Patient/family informed of their freedom to choose among providers that offer the needed level of care, that participate in Medicare, Medicaid or managed care program needed by the patient, have an available bed and are willing to accept the patient.  6/6/2014Patient/family informed of [facility]' ownership interest in [facility], as well as of the fact that they are under no obligation to receive care at this facility.  PASARR submitted to EDS on 06/25/2012 PASARR number received from [employer] on 06/25/2012 FL2 transmitted to all facilities in geographic area requested by pt/family on 06/25/2012  FL2 transmitted to all facilities within larger geographic area on  Patient informed that his/her managed care company has contracts with or will negotiate with certain facilities, including the following:  Patient/family informed of bed offers received: 06/25/2012 Patient chooses bed at [facility] nursing facility Physician recommends and patient chooses bed at  Patient to be transferred to on 06/25/2012 Patient to be transferred to facility by [facility] The following physician request were entered in Epic:  Additional Comments:

## 2012-06-25 NOTE — Clinical Social Work Psychosocial (Signed)
Clinical Social Work Department  BRIEF PSYCHOSOCIAL ASSESSMENT  Patient: [name]  Account Number: [id number]  Admit date: 06/24/12 Clinical Social Worker Sabino Niemann, MSW Date/Time:  Referred by: Physician Date Referred: 06/25/2012 Referred for   SNF Placement   Other Referral:  Interview type: Patient  Other interview type: PSYCHOSOCIAL DATA  Living Status: with her son Admitted from facility:  Level of care:  Primary support name: Tessmer,Michael  Primary support relationship to patient: Son Degree of support available:  Poor  CURRENT CONCERNS  Current Concerns   Post-Acute Placement   Other Concerns:  SOCIAL WORK ASSESSMENT / PLAN  CSW met with pt re: PT recommendation for SNF.   Pt lives with her son  CSW explained placement process and answered questions.   Pt reports no preference at this time   CSW completed FL2 and initiated SNF search.     Assessment/plan status: Information/Referral to [facility]  Other assessment/ plan:  Information/referral to community resources:  SNF     PATIENT'S/FAMILY'S RESPONSE TO PLAN OF CARE:  Pt  reports she is agreeable to ST SNF in order to increase strength and independence with mobility prior to returning home  Pt verbalized understanding of placement process and appreciation for CSW assist.   Sabino Niemann, MSW [phone]

## 2012-06-25 NOTE — Discharge Summary (Signed)
Physician Discharge Summary  Patient ID: [name] MRN: [id number] DOB/AGE: Mar 19, 1945 67 y.o.  Admit date: 06/24/2012 Discharge date: 06/25/2012  Admission Diagnoses: Left Thumb EPL Tendon Rupture Past Medical History  Diagnosis Date  . Chronic obstructive pulmonary disease   . Depression     followed by Dr. Gwyndolyn Kaufman, [facility]  . Gastroesophageal reflux disease   . Hypertension   . Hyperlipidemia   . Interstitial cystitis     followed by Dr. Isabel Caprice  . Irritable bowel syndrome     Colonoscopy 12/2003 and EGD 1997 WNL  . Spinal stenosis     L3-L4, L4-L5 per MRI 12/2004, s/p decompression 02/2007 by Dr. Gerlene Fee, residual chronic low back pain  . 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 Obstructive sleep apnea     Noncompliant with CPAP "ineffective"  . Anxiety   . 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 Elnoria Howard  . History of blood transfusion     during GAVE associated GI bleed  . Cataract     Left eye pending surgery  . Clotting disorder 2009    LLE DVT and PE (1 month apart)  . Fibromyalgia   . Incontinent of urine     at night  . Complication of anesthesia     Hard time putting patient to sleep  . Insomnia   . Shortness of breath   . Edema leg     left  . Family history of anesthesia complication     "Mama couldn't wake up easy" (06/24/2012)  . CHF (congestive heart failure)   . DVT (deep venous thrombosis)     "left" (06/24/2012)  . Pneumonia     "double once year" (06/24/2012)  . Chronic bronchitis     "I forever have bronchitis" (06/24/2012)  . Anemia   . Daily headache     "just about" (06/24/2012)  . Bell's palsy 1980's  . Arthritis     "I'm eat up w/it" (06/24/2012)  . Chronic lower back pain   . 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 Breast cancer     Bilateral s/p mastectomies  . On home oxygen therapy     "2L prn" (06/24/2012)    Discharge Diagnoses:  Left thumb epl tendon rupture  Surgeries:  Procedure(s): LEFT THUMB EIP TO EPL TENDON TRANSFER on 06/24/2012    Consultants:  None  Discharged Condition: Improved  Hospital Course: [name] is an 67 y.o. female who was admitted 06/24/2012 with a chief complaint of No chief complaint on file. , and found to have a diagnosis of Left Thumb EPL Tendon Rupture.  They were brought to the operating room on 06/24/2012 and underwent Procedure(s): LEFT THUMB EIP TO EPL TENDON TRANSFER.    They were given perioperative antibiotics: Anti-infectives   Start     Dose/Rate Route Frequency Ordered Stop   06/24/12 1850  ceFAZolin (ANCEF) IVPB 1 g/50 mL premix     1 g 100 mL/hr over 30 Minutes Intravenous Every 8 hours 06/24/12 1650     06/24/12 1700  ceFAZolin (ANCEF) IVPB 1 g/50 mL premix  Status:  Discontinued     1 g 100 mL/hr over 30 Minutes Intravenous NOW 06/24/12 1650 06/24/12 1655   06/24/12 1400  ceFAZolin (ANCEF) 3 g in dextrose 5 % 50 mL IVPB  Status:  Discontinued     3 g 160 mL/hr over 30 Minutes Intravenous To Surgery  06/24/12 1346 06/24/12 1547   06/24/12 1015  ceFAZolin (ANCEF) 3 g in dextrose 5 % 50 mL IVPB     3 g 160 mL/hr over 30 Minutes Intravenous On call to O.R. 06/24/12 1006 06/24/12 1352    .  They were given sequential compression devices, early ambulation, and Other (comment)ambulation for DVT prophylaxis.  Recent vital signs: Patient Vitals for the past 24 hrs:  BP Temp Temp src Pulse Resp SpO2 Height Weight  06/25/12 0542 123/63 mmHg 98.6 F (37 C) Oral 71 18 97 % - -  06/25/12 0100 120/58 mmHg 98.7 F (37.1 C) Oral 67 18 96 % - -  06/24/12 2028 118/54 mmHg 99 F (37.2 C) Oral 71 18 95 % - -  06/24/12 1615 110/66 mmHg 97.7 F (36.5 C) Oral 74 16 94 % - -  06/24/12 1530 - 97.7 F (36.5 C) - - - - 5' 6.93" (1.7 m) 149.7 kg (330 lb 0.5 oz)  06/24/12 1527 127/58 mmHg - - 71 17 95 % - -  06/24/12 1515 - - - 69 12 95 % - -  06/24/12 1512 124/48 mmHg - - 68 14 95 % - -  06/24/12 1501 123/55 mmHg - - - - - -  -  06/24/12 1500 - - - 69 14 93 % - -  06/24/12 1445 - - - 67 14 96 % - -  06/24/12 1442 113/46 mmHg - - 70 17 97 % - -  06/24/12 1430 - - - 70 11 96 % - -  06/24/12 1427 118/100 mmHg - - 79 28 85 % - -  06/24/12 1426 - - - 76 13 89 % - -  06/24/12 1425 - 98 F (36.7 C) - - - - - -  06/24/12 1236 126/68 mmHg - - - - - - -  06/24/12 1235 126/68 mmHg - - 72 15 96 % - -  06/24/12 1234 - - - 70 16 96 % - -  06/24/12 1233 - - - 68 10 96 % - -  06/24/12 1232 - - - 70 14 97 % - -  06/24/12 1231 143/68 mmHg - - 70 11 95 % - -  06/24/12 1230 - - - 70 16 96 % - -  06/24/12 1229 - - - 74 13 93 % - -  06/24/12 1228 - - - 69 13 96 % - -  06/24/12 1227 - - - 70 11 96 % - -  06/24/12 1226 134/73 mmHg - - 72 11 88 % - -  06/24/12 1225 - - - 68 11 89 % - -  06/24/12 1224 - - - 65 17 93 % - -  06/24/12 1223 - - - 65 14 92 % - -  06/24/12 1222 - - - 68 9 95 % - -  06/24/12 1221 138/69 mmHg - - 66 15 97 % - -  06/24/12 1220 - - - 70 16 93 % - -  06/24/12 1042 117/61 mmHg 98.3 F (36.8 C) Oral 70 18 95 % - -  .  Recent laboratory studies: No results found.  Discharge Medications:     Medication List    TAKE these medications       albuterol (5 MG/ML) 0.5% nebulizer solution  Commonly known as:  PROVENTIL  Take 2.5 mg by nebulization every 6 (six) hours as needed for wheezing or shortness of breath.     docusate sodium 100 MG capsule  Commonly  known as:  COLACE  Take 1 capsule (100 mg total) by mouth 2 (two) times daily.     enoxaparin 100 MG/ML injection  Commonly known as:  LOVENOX  Inject 1 mL (100 mg total) into the skin every 12 (twelve) hours.     ferrous sulfate 325 (65 FE) MG EC tablet  Take 325 mg by mouth 3 (three) times daily with meals.     ipratropium 0.02 % nebulizer solution  Commonly known as:  ATROVENT  Take 0.5 mg by nebulization 3 (three) times daily as needed for wheezing.     lamoTRIgine 200 MG tablet  Commonly known as:  LAMICTAL  Take 200 mg by mouth every  morning.     LORazepam 1 MG tablet  Commonly known as:  ATIVAN  Take 1-2 mg by mouth 2 (two) times daily. Take one tablet in morning and two tablets by mouth at bedtime     NEURONTIN 100 MG capsule  Generic drug:  gabapentin  Take 100 mg by mouth 3 (three) times daily.     oxyCODONE-acetaminophen 10-325 MG per tablet  Commonly known as:  PERCOCET  Take 1 tablet by mouth every 4 (four) hours as needed for pain.     pantoprazole 40 MG tablet  Commonly known as:  PROTONIX  Take 40 mg by mouth daily.     pravastatin 40 MG tablet  Commonly known as:  PRAVACHOL  Take 40 mg by mouth every morning.     SEROQUEL 200 MG tablet  Generic drug:  QUEtiapine  Take 400 mg by mouth at bedtime.     vitamin C 500 MG tablet  Commonly known as:  ASCORBIC ACID  Take 1 tablet (500 mg total) by mouth daily.     vitamin C 500 MG tablet  Commonly known as:  ASCORBIC ACID  Take 1 tablet (500 mg total) by mouth daily.     ZOLOFT 100 MG tablet  Generic drug:  sertraline  Take 200 mg by mouth every morning.        Diagnostic Studies: Dg Chest 2 View  06/18/2012   *RADIOLOGY REPORT*  Clinical Data: Preoperative respiratory evaluation.  Smoker with current history of COPD.  CHEST - 2 VIEW  Comparison: Two-view chest x-ray 12/26/2011, 12/22/2011, 05/01/2011.  Findings: Suboptimal inspiration due to body habitus which accounts for crowded bronchovascular markings at the bases and accentuates the cardiac silhouette.  Taking this into account, cardiac silhouette upper normal in size for AP technique.  Coarse interstitial markings throughout both lungs with associated moderate to severe central peribronchial thickening, unchanged.  No new pulmonary parenchymal abnormalities.  No pleural effusions. Degenerative changes involving the thoracic spine.  IMPRESSION: No acute cardiopulmonary disease.  Stable chronic interstitial lung disease which is likely at least in part due to pulmonary fibrosis. Stable moderate  to marked central peribronchial thickening consistent with chronic bronchitis and/or asthma.   Original Report Authenticated By: Hulan Saas, M.D.   Dg Bone Density  06/17/2012   The Bone Mineral Densitometry hard-copy report (which includes all data, graphical display, and FRAX results when applicable) has been sent directly to the ordering physician.  This report can also be obtained electronically by viewing images for this exam through the performing facility's EMR, or by logging directly into [employer].   Original Report Authenticated By: Myles Rosenthal, M.D.    They benefited maximally from their hospital stay and there were no complications.     Disposition: 01-Home or Self Care  Future Appointments Provider Department Dept Phone   08/17/2012 2:15 PM Sherrie Mustache [facility] MEDICAL ONCOLOGY [phone]   08/17/2012 2:45 PM Myrtis Ser, NP [facility] MEDICAL ONCOLOGY [phone]     Follow-up Information   Follow up with Sharma Covert, MD. Schedule an appointment as soon as possible for a visit in 14 days.   Contact information:   [address] [place] [address] [phone]        Signed: Sharma Covert 06/25/2012, 8:30 AM

## 2012-06-25 NOTE — Op Note (Signed)
NAMEJANIYLA, [name] NO.:  [id number]  MEDICAL RECORD NO.:  [id number]  LOCATION:  [id number]                        FACILITY:  [facility]  PHYSICIAN:  Madelynn Done, MD  DATE OF BIRTH:  Jul 24, 1945  DATE OF PROCEDURE:  06/24/2012 DATE OF DISCHARGE:                              OPERATIVE REPORT   PREOPERATIVE DIAGNOSIS:  Left thumb epl attritional tendon rupture after distal radius fracture.  POSTOPERATIVE DIAGNOSIS:  Left thumb epl attritional tendon rupture after distal radius fracture.  ATTENDING PHYSICIAN:  Madelynn Done, MD who was scrubbed and present for the entire procedure.  ASSISTANT SURGEON:  None.  ANESTHESIA:  General via LMA and axillary block performed by Dr. Krista [name].  SURGICAL PROCEDURE:  Left thumb extensor indicis proprius tendon transfer to EPL, dorsum of the hand CMC region.  SURGICAL INDICATIONS:  [name] is a right-hand-dominant female who sustained attritional rupture to the left thumb EPL tendon.  The patient elected to undergo the above procedure.  Risks, benefits, and alternatives were discussed in detail with the patient and signed informed consent was obtained.  Risks include, but not limited to bleeding, infection, damage to nearby nerves, arteries, or tendons, loss of motion of the wrist and digits, incomplete relief of symptoms, tendon rupture, need for further surgical intervention.  DESCRIPTION OF PROCEDURE:  The patient was properly identified in the preop holding area and mark with a permanent marker made on the left thumb to indicate correct operative site.  The patient then brought back to the operating room, placed supine on the anesthesia room table where general anesthesia was administered.  The patient tolerated this well. A well-padded tourniquet was then placed on the left brachium and sealed with 1000 drape.  Left upper extremity was then prepped and draped in normal sterile fashion.  Time-out was called.   Correct site was identified, and procedure then begun.  Attention was then turned to the left thumb where a transverse incision was made directly over the EIP, the limb had been elevated, tourniquet insufflated.  Dissection was then carried down to the identified EDC to the index EIP. The EIP was then transected, the distal stump was then tied into the [facility] with fiberwire suture. Next a longitudinal incision was made proximally to locate the tendon, the tendon was then retrieved over the 4th dorsal compartment.  A small window was then carried and elevated using as a small pulley.  The EIP was then transferred to the dorsal aspect of the thumb with blunt dissection. Longitudinal incision was made directly over the thumb metacarpophalangeal joint region.  The EIP was then transferred, the EPL was then carefully identified.  Using a four-strand Pulvertaft weave technique, the EIP was then transferred into the EPL and reinforced with 4-0 FiberWire suture.  This created a nice tenodesis effect with wrist flexion and extension.  The wound was then thoroughly irrigated.  After tendon transfer, all wounds were then copiously irrigated and closed with Prolene suture.  Adaptic dressing, sterile compressive bandage then applied.  The patient was then placed in a well-padded thumb spica splint, extubated, and taken to the recovery room in good condition.  POSTPROCEDURAL PLAN:  The patient  admitted overnight for IV antibiotics and pain control, discharged in the morning, seen back in the office in approximately 2 weeks for wound check, suture removal, application of short-arm thumb spica cast for a total of 4 weeks, immobilization, and begin an outpatient therapy regimen.     Madelynn Done, MD     FWO/MEDQ  D:  06/24/2012  T:  06/25/2012  Job:  [id number]

## 2012-06-26 NOTE — Progress Notes (Signed)
Occupational Therapy Treatment Patient Details Name: [name] MRN: [id number] DOB: 04/04/1945 Today's Date: 06/26/2012 Time: 4098-1191 OT Time Calculation (min): 39 min  OT Assessment / Plan / Recommendation Comments on Treatment Session Pt still with decreased balance, mobility, and overall independence with basic selfcare tasks.  Needs min assist to min guard assist for mobility and safety.  Still needs short term rehab secondary to living alon and not being at a modified independent level.    Follow Up Recommendations  SNF       Equipment Recommendations  None recommended by OT       Frequency Min 2X/week   Plan Discharge plan remains appropriate    Precautions / Restrictions Precautions Precautions: Fall Precaution Comments: elevate L UE pillows or mission sling Required Braces or Orthoses: Other Brace/Splint Other Brace/Splint: pt with cast on left forearm Restrictions Weight Bearing Restrictions: Yes LUE Weight Bearing: Non weight bearing   Pertinent Vitals/Pain O2 sats 88% on room air with activity, 96-97% on 2Ls nasal cannula   ADL  Grooming: Performed;Min guard Where Assessed - Grooming: Supported standing Toilet Transfer: Performed;Minimal Dentist Method: Other (comment) (ambulate with RW and platform on the left side) Acupuncturist: Bedside commode Toileting - Clothing Manipulation and Hygiene: Performed;Min guard Where Assessed - Glass blower/designer Manipulation and Hygiene: Sit to stand from 3-in-1 or toilet Transfers/Ambulation Related to ADLs: Pt overall min assist level for mobility using the RW with the platform. ADL Comments: Pt with less pain today than yesterday.  Still at a min assist to min guard assist level for selfcare tasks and toileting.  Will benefit from SNF short term for follow-up.    OT Goals ADL Goals ADL Goal: Toilet Transfer - Progress: Progressing toward goals ADL Goal: Toileting - Clothing  Manipulation - Progress: Progressing toward goals ADL Goal: Toileting - Hygiene - Progress: Progressing toward goals Miscellaneous OT Goals OT Goal: Miscellaneous Goal #1 - Progress: Progressing toward goals  Visit Information  Last OT Received On: 06/26/12 Assistance Needed: +1    Subjective Data  Subjective: I was up a lot last night.         Mobility  Bed Mobility Bed Mobility: Supine to Sit Supine to Sit: 4: Min guard;HOB elevated;With rails Transfers Sit to Stand: 4: Min assist;From bed;From chair/3-in-1 Stand to Sit: 4: Min assist;To chair/3-in-1 Details for Transfer Assistance: Min instructional cueing for hand placement in the RUE during sit to stand and stand to sit transitions.         Balance Balance Balance Assessed: Yes Static Standing Balance Static Standing - Balance Support: Right upper extremity supported;Left upper extremity supported Static Standing - Level of Assistance: 5: Stand by assistance   End of Session OT - End of Session Activity Tolerance: Patient limited by fatigue Patient left: in chair;with call bell/phone within reach Nurse Communication: Mobility status     Gualberto Wahlen OTR/L Pager number [phone] 06/26/2012, 8:57 AM

## 2012-06-26 NOTE — Progress Notes (Signed)
Subjective: 2 Days Post-Op Procedure(s) (LRB): LEFT THUMB EIP TO EPL TENDON TRANSFER (Left) Patient reports pain as moderate   Objective: Vital signs in last 24 hours: Temp:  [97.8 F (36.6 C)-98.5 F (36.9 C)] 98.1 F (36.7 C) (06/07 0525) Pulse Rate:  [64-73] 68 (06/07 0525) Resp:  [18-19] 19 (06/07 0525) BP: (121-126)/(47-53) 126/50 mmHg (06/07 0525) SpO2:  [95 %-97 %] 97 % (06/07 0525)  Intake/Output from previous day: 06/06 0701 - 06/07 0700 In: 1331.7 [P.O.:450; I.V.:881.7] Out: 800 [Urine:800] Intake/Output this shift:     Recent Labs  06/25/12 0650  HGB 13.5    Recent Labs  06/25/12 0650  WBC 6.4  RBC 5.10  HCT 44.0  PLT 229   No results found for this basename: NA, K, CL, CO2, BUN, CREATININE, GLUCOSE, CALCIUM,  in the last 72 hours  Recent Labs  06/24/12 1039  INR 1.01    Awake,alert nontoxic Fingers warm well perfused Good digital motion   Assessment/Plan: 2 Days Post-Op Procedure(s) (LRB): LEFT THUMB EIP TO EPL TENDON TRANSFER (Left) Pt is on hospital for one reason: placement FL2 form completed likely will be in hospital over the weekend with anticipated d/c on Monday Pt voiced understanding of plan Continue with splint elevation PT/OT  Sharma Covert 06/26/2012, 7:55 AM

## 2012-06-26 NOTE — Progress Notes (Signed)
Clinical Social Work  CSW reviewed [facility] for bed offers. At this time, no offers for patient. CSW will continue to follow.  Unk Lightning, LCSW (Weekend Coverage)

## 2012-06-27 MED ORDER — WHITE PETROLATUM GEL
Status: AC
  Administered 2012-06-27: 18:00:00
  Filled 2012-06-27: qty 5

## 2012-06-27 NOTE — Clinical Social Work Note (Signed)
Clinical Social Work  CSW received [facility] ([facility]) for bed offers. No bed offers at this time. CSW will continue to follow.   Dede Query, MSW, LCSW Clinical Social Worker Weekend Coverage

## 2012-06-27 NOTE — Progress Notes (Signed)
ANTICOAGULATION CONSULT NOTE - Follow up  Pharmacy Consult for Coumadin  Indication: h/o PE/ L DVT   Allergies  Allergen Reactions  . Adhesive (Tape) Other (See Comments)    "I break out all over"; Can only use paper tape  . Codeine Other (See Comments)    "first dose I took, my aide found me the next day laying on the floor"  . Morphine Other (See Comments)    caused change in mental status, she does not want to try it again.  . Sulfonamide Derivatives Nausea And Vomiting    Patient Measurements: Height: 5' 6.93" (170 cm) (from 06/18/12) Weight: 330 lb 0.5 oz (149.7 kg) (from 06/18/12) IBW/kg (Calculated) : 61.44   Vital Signs: Temp: 98.5 F (36.9 C) (06/08 2150) Temp src: Oral (06/08 2150) BP: 107/52 mmHg (06/08 2150) Pulse Rate: 67 (06/08 2150)  Labs:  Recent Labs  06/25/12 0650  HGB 13.5  HCT 44.0  PLT 229    Estimated Creatinine Clearance: 104.2 ml/min (by C-G formula based on Cr of 0.7).   Medical History: Past Medical History  Diagnosis Date  . Chronic obstructive pulmonary disease   . Depression     followed by Dr. Gwyndolyn Kaufman, [facility]  . Gastroesophageal reflux disease   . Hypertension   . Hyperlipidemia   . Interstitial cystitis     followed by Dr. Isabel Caprice  . Irritable bowel syndrome     Colonoscopy 12/2003 and EGD 1997 WNL  . Spinal stenosis     L3-L4, L4-L5 per MRI 12/2004, s/p decompression 02/2007 by Dr. Gerlene Fee, residual chronic low back pain  . 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 Obstructive sleep apnea     Noncompliant with CPAP "ineffective"  . Anxiety   . 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 Elnoria Howard  . History of blood transfusion     during GAVE associated GI bleed  . Cataract     Left eye pending surgery  . Clotting disorder 2009    LLE DVT and PE (1 month apart)  . Fibromyalgia   . Incontinent of urine     at night  . Complication of anesthesia     Hard time putting patient to sleep  .  Insomnia   . Shortness of breath   . Edema leg     left  . Family history of anesthesia complication     "Mama couldn't wake up easy" (06/24/2012)  . CHF (congestive heart failure)   . DVT (deep venous thrombosis)     "left" (06/24/2012)  . Pneumonia     "double once year" (06/24/2012)  . Chronic bronchitis     "I forever have bronchitis" (06/24/2012)  . Anemia   . Daily headache     "just about" (06/24/2012)  . Bell's palsy 1980's  . Arthritis     "I'm eat up w/it" (06/24/2012)  . Chronic lower back pain   . 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 Breast cancer     Bilateral s/p mastectomies  . On home oxygen therapy     "2L prn" (06/24/2012)    Medications:  Prescriptions prior to admission  Medication Sig Dispense Refill  . albuterol (PROVENTIL) (5 MG/ML) 0.5% nebulizer solution Take 2.5 mg by nebulization every 6 (six) hours as needed for wheezing or shortness of breath.      . enoxaparin (LOVENOX) 100 MG/ML injection Inject 1 mL (100 mg  total) into the skin every 12 (twelve) hours.  60 Syringe  2  . ferrous sulfate 325 (65 FE) MG EC tablet Take 325 mg by mouth 3 (three) times daily with meals.      . gabapentin (NEURONTIN) 100 MG capsule Take 100 mg by mouth 3 (three) times daily.       Marland Kitchen ipratropium (ATROVENT) 0.02 % nebulizer solution Take 0.5 mg by nebulization 3 (three) times daily as needed for wheezing.      . lamoTRIgine (LAMICTAL) 200 MG tablet Take 200 mg by mouth every morning.      Marland Kitchen LORazepam (ATIVAN) 1 MG tablet Take 1-2 mg by mouth 2 (two) times daily. Take one tablet in morning and two tablets by mouth at bedtime      . pantoprazole (PROTONIX) 40 MG tablet Take 40 mg by mouth daily.      . pravastatin (PRAVACHOL) 40 MG tablet Take 40 mg by mouth every morning.      Marland Kitchen QUEtiapine (SEROQUEL) 200 MG tablet Take 400 mg by mouth at bedtime.       . sertraline (ZOLOFT) 100 MG tablet Take 200 mg by mouth every morning.       . vitamin C (ASCORBIC ACID) 500 MG  tablet Take 1 tablet (500 mg total) by mouth daily.  90 tablet  0   Scheduled:  .  ceFAZolin (ANCEF) IV  1 g Intravenous Q8H  . docusate sodium  100 mg Oral BID  . enoxaparin (LOVENOX) injection  100 mg Subcutaneous Q12H  . gabapentin  100 mg Oral TID  . lamoTRIgine  200 mg Oral q morning - 10a  . LORazepam  1 mg Oral Daily  . LORazepam  2 mg Oral QHS  . multivitamin with minerals  1 tablet Oral Daily  . pantoprazole  40 mg Oral Daily  . QUEtiapine  400 mg Oral QHS  . sertraline  200 mg Oral q morning - 10a  . simvastatin  20 mg Oral q1800  . vitamin C  1,000 mg Oral Daily    Assessment: 67 y.o morbidly obese female with h/o PE/ L. DVT continues on Lovenox 100 mg SQ q12h per Dr. Gaylyn Rong, her hematogist. Recently taken off of coumadin by Dr. Gaylyn Rong due to difficulty regulating INR and started on lovenox (lifelong).  PLTC =229K on 06/25/12.   SCr = 0.7 on 06/17/12 , estimated CrCl ~ 100 ml/ min. No bleeding noted.    POD#3 left thumb tendon transfer due to left thumb tendon rupture s/p healed left distal radius fracture.    Pharmacist outpatient note on 06/17/12 indicated Dr. Gaylyn Rong, her hematologist,  stated she is not a candidate for xarelto with hx of GI bleeds and monthly procedures.   PMH: PE '09, DVT 05/2010   Goal of Therapy:  INR 2-3 Monitor platelets by anticoagulation protocol: Yes   Plan:  Continue Lovenox 100 mg SQ q12h (PTA dose per hematologist)  Monitor CBC q72h and SCR weekly  Noah Delaine, RPh Clinical Pharmacist Pager: [phone] 06/27/2012,11:45 PM

## 2012-06-27 NOTE — Discharge Summary (Signed)
Physician Discharge Summary  Patient ID: [name] MRN: [id number] DOB/AGE: [birth year] 67 y.o.  Admit date: 06/24/2012 Discharge date: 06/28/2012  Admission Diagnoses: Left Thumb EPL Tendon Rupture Past Medical History  Diagnosis Date  . Chronic obstructive pulmonary disease   . Depression     followed by Dr. Gwyndolyn Kaufman, [facility]  . Gastroesophageal reflux disease   . Hypertension   . Hyperlipidemia   . Interstitial cystitis     followed by Dr. Isabel Caprice  . Irritable bowel syndrome     Colonoscopy 12/2003 and EGD 1997 WNL  . Spinal stenosis     L3-L4, L4-L5 per MRI 12/2004, s/p decompression 02/2007 by Dr. Gerlene Fee, residual chronic low back pain  . 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 Obstructive sleep apnea     Noncompliant with CPAP "ineffective"  . Anxiety   . 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 Elnoria Howard  . History of blood transfusion     during GAVE associated GI bleed  . Cataract     Left eye pending surgery  . Clotting disorder 2009    LLE DVT and PE (1 month apart)  . Fibromyalgia   . Incontinent of urine     at night  . Complication of anesthesia     Hard time putting patient to sleep  . Insomnia   . Shortness of breath   . Edema leg     left  . Family history of anesthesia complication     "Mama couldn't wake up easy" (06/24/2012)  . CHF (congestive heart failure)   . DVT (deep venous thrombosis)     "left" (06/24/2012)  . Pneumonia     "double once year" (06/24/2012)  . Chronic bronchitis     "I forever have bronchitis" (06/24/2012)  . Anemia   . Daily headache     "just about" (06/24/2012)  . Bell's palsy 1980's  . Arthritis     "I'm eat up w/it" (06/24/2012)  . Chronic lower back pain   . 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 Breast cancer     Bilateral s/p mastectomies  . On home oxygen therapy     "2L prn" (06/24/2012)    Discharge Diagnoses:  Left thumb epl tendon rupture  Surgeries:  Procedure(s): LEFT THUMB EIP TO EPL TENDON TRANSFER on 06/24/2012    Consultants:  none  Discharged Condition: Improved  Hospital Course: SHAHD OCCHIPINTI is an 67 y.o. female who was admitted 06/24/2012 with a chief complaint of No chief complaint on file. , and found to have a diagnosis of Left Thumb EPL Tendon Rupture.  They were brought to the operating room on 06/24/2012 and underwent Procedure(s): LEFT THUMB EIP TO EPL TENDON TRANSFER.    They were given perioperative antibiotics: Anti-infectives   Start     Dose/Rate Route Frequency Ordered Stop   06/24/12 1850  ceFAZolin (ANCEF) IVPB 1 g/50 mL premix     1 g 100 mL/hr over 30 Minutes Intravenous Every 8 hours 06/24/12 1650     06/24/12 1700  ceFAZolin (ANCEF) IVPB 1 g/50 mL premix  Status:  Discontinued     1 g 100 mL/hr over 30 Minutes Intravenous NOW 06/24/12 1650 06/24/12 1655   06/24/12 1400  ceFAZolin (ANCEF) 3 g in dextrose 5 % 50 mL IVPB  Status:  Discontinued     3 g 160 mL/hr over 30 Minutes Intravenous To Surgery  06/24/12 1346 06/24/12 1547   06/24/12 1015  ceFAZolin (ANCEF) 3 g in dextrose 5 % 50 mL IVPB     3 g 160 mL/hr over 30 Minutes Intravenous On call to O.R. 06/24/12 1006 06/24/12 1352    .  They were given sequential compression devices, early ambulation, and Other (comment) ambulation for DVT prophylaxis.  Recent vital signs: Patient Vitals for the past 24 hrs:  BP Temp Temp src Pulse Resp SpO2  06/27/12 0703 118/59 mmHg 98.6 F (37 C) Oral 67 20 91 %  06/26/12 2255 123/51 mmHg 98.7 F (37.1 C) Oral 79 18 98 %  06/26/12 1357 115/44 mmHg 99.3 F (37.4 C) Oral 71 20 97 %  .  Recent laboratory studies: No results found.  Discharge Medications:     Medication List    TAKE these medications       albuterol (5 MG/ML) 0.5% nebulizer solution  Commonly known as:  PROVENTIL  Take 2.5 mg by nebulization every 6 (six) hours as needed for wheezing or shortness of breath.     docusate sodium 100 MG capsule   Commonly known as:  COLACE  Take 1 capsule (100 mg total) by mouth 2 (two) times daily.     enoxaparin 100 MG/ML injection  Commonly known as:  LOVENOX  Inject 1 mL (100 mg total) into the skin every 12 (twelve) hours.     ferrous sulfate 325 (65 FE) MG EC tablet  Take 325 mg by mouth 3 (three) times daily with meals.     ipratropium 0.02 % nebulizer solution  Commonly known as:  ATROVENT  Take 0.5 mg by nebulization 3 (three) times daily as needed for wheezing.     lamoTRIgine 200 MG tablet  Commonly known as:  LAMICTAL  Take 200 mg by mouth every morning.     LORazepam 1 MG tablet  Commonly known as:  ATIVAN  Take 1-2 mg by mouth 2 (two) times daily. Take one tablet in morning and two tablets by mouth at bedtime     NEURONTIN 100 MG capsule  Generic drug:  gabapentin  Take 100 mg by mouth 3 (three) times daily.     oxyCODONE-acetaminophen 10-325 MG per tablet  Commonly known as:  PERCOCET  Take 1 tablet by mouth every 4 (four) hours as needed for pain.     pantoprazole 40 MG tablet  Commonly known as:  PROTONIX  Take 40 mg by mouth daily.     pravastatin 40 MG tablet  Commonly known as:  PRAVACHOL  Take 40 mg by mouth every morning.     SEROQUEL 200 MG tablet  Generic drug:  QUEtiapine  Take 400 mg by mouth at bedtime.     vitamin C 500 MG tablet  Commonly known as:  ASCORBIC ACID  Take 1 tablet (500 mg total) by mouth daily.     vitamin C 500 MG tablet  Commonly known as:  ASCORBIC ACID  Take 1 tablet (500 mg total) by mouth daily.     ZOLOFT 100 MG tablet  Generic drug:  sertraline  Take 200 mg by mouth every morning.        Diagnostic Studies: Dg Chest 2 View  06/18/2012   *RADIOLOGY REPORT*  Clinical Data: Preoperative respiratory evaluation.  Smoker with current history of COPD.  CHEST - 2 VIEW  Comparison: Two-view chest x-ray 12/26/2011, 12/22/2011, 05/01/2011.  Findings: Suboptimal inspiration due to body habitus which accounts for crowded  bronchovascular markings at the  bases and accentuates the cardiac silhouette.  Taking this into account, cardiac silhouette upper normal in size for AP technique.  Coarse interstitial markings throughout both lungs with associated moderate to severe central peribronchial thickening, unchanged.  No new pulmonary parenchymal abnormalities.  No pleural effusions. Degenerative changes involving the thoracic spine.  IMPRESSION: No acute cardiopulmonary disease.  Stable chronic interstitial lung disease which is likely at least in part due to pulmonary fibrosis. Stable moderate to marked central peribronchial thickening consistent with chronic bronchitis and/or asthma.   Original Report Authenticated By: Hulan Saas, M.D.   Dg Bone Density  06/17/2012   The Bone Mineral Densitometry hard-copy report (which includes all data, graphical display, and FRAX results when applicable) has been sent directly to the ordering physician.  This report can also be obtained electronically by viewing images for this exam through the performing facility's EMR, or by logging directly into [employer].   Original Report Authenticated By: Myles Rosenthal, M.D.    They benefited maximally from their hospital stay and there were no complications.     Disposition: 01-Home or Self Care      Future Appointments Provider Department Dept Phone   08/17/2012 2:15 PM Sherrie Mustache [facility] MEDICAL ONCOLOGY [phone]   08/17/2012 2:45 PM Myrtis Ser, NP [facility] MEDICAL ONCOLOGY [phone]     Follow-up Information   Follow up with Sharma Covert, MD. Schedule an appointment as soon as possible for a visit in 14 days.   Contact information:   [address] [place] [address] [phone]        Signed: Sharma Covert 06/27/2012, 9:44 AM

## 2012-06-28 LAB — CBC
HCT: 39.3 % (ref 36.0–46.0)
Hemoglobin: 12.3 g/dL (ref 12.0–15.0)
MCH: 26.3 pg (ref 26.0–34.0)
MCHC: 31.3 g/dL (ref 30.0–36.0)

## 2012-06-28 NOTE — Progress Notes (Signed)
Physical Therapy Treatment Patient Details Name: [name] MRN: [id number] DOB: [birth year] Today's Date: 06/28/2012 Time: 9562-1308 PT Time Calculation (min): 35 min  PT Assessment / Plan / Recommendation Comments on Treatment Session  Making slow progress with mobility; Continue to agree with postacute rehab at dc to maximize independence and safety with mobility prior to dc home    Follow Up Recommendations  SNF;Supervision/Assistance - 24 hour     Does the patient have the potential to tolerate intense rehabilitation     Barriers to Discharge        Equipment Recommendations  Wheelchair (measurements PT);Wheelchair cushion (measurements PT)    Recommendations for Other Services    Frequency Min 3X/week   Plan Discharge plan remains appropriate    Precautions / Restrictions Precautions Precautions: Fall Precaution Comments: elevate L UE pillows or mission sling Required Braces or Orthoses: Other Brace/Splint Other Brace/Splint: pt with cast on left forearm Restrictions LUE Weight Bearing: Weight bear through elbow only Other Position/Activity Restrictions: platform walker L UE-weightbear through forearm   Pertinent Vitals/Pain Grimace with moving L UE; did not rate pain; elevated extremity    Mobility  Bed Mobility Bed Mobility: Supine to Sit Supine to Sit: 4: Min guard;HOB elevated;With rails Details for Bed Mobility Assistance: Heavy use of rails with R hand Transfers Transfers: Sit to Stand;Stand to Sit Sit to Stand: 4: Min assist;From bed;From chair/3-in-1 Stand to Sit: 4: Min assist;To chair/3-in-1 Details for Transfer Assistance: Min instructional cueing for hand placement in the RUE during sit to stand and stand to sit transitions; required more assist with rise from bed Ambulation/Gait Ambulation/Gait Assistance: 4: Min assist;4: Min guard Ambulation Distance (Feet): 60 Feet Assistive device: Left platform walker Ambulation/Gait Assistance Details:  Continued cues for safety, RW proximity; fatigues quickly, required 2 seated rest breaks Gait Pattern: Decreased stride length;Trunk flexed;Step-through pattern    Exercises     PT Diagnosis:    PT Problem List:   PT Treatment Interventions:     PT Goals Acute Rehab PT Goals Time For Goal Achievement: 07/09/12 Potential to Achieve Goals: Good Pt will go Supine/Side to Sit: with supervision PT Goal: Supine/Side to Sit - Progress: Progressing toward goal Pt will go Sit to Stand: with supervision PT Goal: Sit to Stand - Progress: Progressing toward goal Pt will Transfer Bed to Chair/Chair to Bed: with supervision PT Transfer Goal: Bed to Chair/Chair to Bed - Progress: Progressing toward goal Pt will Ambulate: 51 - 150 feet;with supervision;with rolling walker PT Goal: Ambulate - Progress: Progressing toward goal  Visit Information  Assistance Needed: +1    Subjective Data  Subjective: Very concerned with finding her wheelchair, and getting in touch with her [employer] to get some clothes Patient Stated Goal: home   Cognition  Cognition Arousal/Alertness: Awake/alert Behavior During Therapy: WFL for tasks assessed/performed Overall Cognitive Status: Within Functional Limits for tasks assessed    Balance     End of Session PT - End of Session Equipment Utilized During Treatment: Gait belt Activity Tolerance: Patient limited by fatigue Patient left: in chair;with call bell/phone within reach Nurse Communication: Mobility status   GP     Van Clines [facility] [place], [place] [phone]  06/28/2012, 4:26 PM

## 2012-06-28 NOTE — Progress Notes (Signed)
ANTICOAGULATION - Follow up  Pharmacy Re:  Enoxaparin  Indication: h/o PE/ L DVT   Allergies  Allergen Reactions  . Adhesive (Tape) Other (See Comments)    "I break out all over"; Can only use paper tape  . Codeine Other (See Comments)    "first dose I took, my aide found me the next day laying on the floor"  . Morphine Other (See Comments)    caused change in mental status, she does not want to try it again.  . Sulfonamide Derivatives Nausea And Vomiting   Patient Measurements: Height: 5' 6.93" (170 cm) (from 06/18/12) Weight: 330 lb 0.5 oz (149.7 kg) (from 06/18/12) IBW/kg (Calculated) : 61.44  Vital Signs: Temp: 98 F (36.7 C) (06/09 0712) Temp src: Oral (06/09 0712) BP: 111/65 mmHg (06/09 0712) Pulse Rate: 72 (06/09 0712)  Labs:  Recent Labs  06/28/12 0400  HGB 12.3  HCT 39.3  PLT 216  CREATININE 0.63   Estimated Creatinine Clearance: 104.2 ml/min (by C-G formula based on Cr of 0.63).  Medical History: Past Medical History  Diagnosis Date  . Chronic obstructive pulmonary disease   . Depression     followed by Dr. Gwyndolyn Kaufman, [facility]  . Gastroesophageal reflux disease   . Hypertension   . Hyperlipidemia   . Interstitial cystitis     followed by Dr. Isabel Caprice  . Irritable bowel syndrome     Colonoscopy 12/2003 and EGD 1997 WNL  . Spinal stenosis     L3-L4, L4-L5 per MRI 12/2004, s/p decompression 02/2007 by Dr. Gerlene Fee, residual chronic low back pain  . 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 Obstructive sleep apnea     Noncompliant with CPAP "ineffective"  . Anxiety   . 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 Elnoria Howard  . History of blood transfusion     during GAVE associated GI bleed  . Cataract     Left eye pending surgery  . Clotting disorder 2009    LLE DVT and PE (1 month apart)  . Fibromyalgia   . Incontinent of urine     at night  . Complication of anesthesia     Hard time putting patient to sleep  . Insomnia    . Shortness of breath   . Edema leg     left  . Family history of anesthesia complication     "Mama couldn't wake up easy" (06/24/2012)  . CHF (congestive heart failure)   . DVT (deep venous thrombosis)     "left" (06/24/2012)  . Pneumonia     "double once year" (06/24/2012)  . Chronic bronchitis     "I forever have bronchitis" (06/24/2012)  . Anemia   . Daily headache     "just about" (06/24/2012)  . Bell's palsy 1980's  . Arthritis     "I'm eat up w/it" (06/24/2012)  . Chronic lower back pain   . 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 Breast cancer     Bilateral s/p mastectomies  . On home oxygen therapy     "2L prn" (06/24/2012)   Medications:  Prescriptions prior to admission  Medication Sig Dispense Refill  . albuterol (PROVENTIL) (5 MG/ML) 0.5% nebulizer solution Take 2.5 mg by nebulization every 6 (six) hours as needed for wheezing or shortness of breath.      . enoxaparin (LOVENOX) 100 MG/ML injection Inject 1 mL (100 mg total) into the skin  every 12 (twelve) hours.  60 Syringe  2  . ferrous sulfate 325 (65 FE) MG EC tablet Take 325 mg by mouth 3 (three) times daily with meals.      . gabapentin (NEURONTIN) 100 MG capsule Take 100 mg by mouth 3 (three) times daily.       Marland Kitchen ipratropium (ATROVENT) 0.02 % nebulizer solution Take 0.5 mg by nebulization 3 (three) times daily as needed for wheezing.      . lamoTRIgine (LAMICTAL) 200 MG tablet Take 200 mg by mouth every morning.      Marland Kitchen LORazepam (ATIVAN) 1 MG tablet Take 1-2 mg by mouth 2 (two) times daily. Take one tablet in morning and two tablets by mouth at bedtime      . pantoprazole (PROTONIX) 40 MG tablet Take 40 mg by mouth daily.      . pravastatin (PRAVACHOL) 40 MG tablet Take 40 mg by mouth every morning.      Marland Kitchen QUEtiapine (SEROQUEL) 200 MG tablet Take 400 mg by mouth at bedtime.       . sertraline (ZOLOFT) 100 MG tablet Take 200 mg by mouth every morning.       . vitamin C (ASCORBIC ACID) 500 MG tablet Take 1  tablet (500 mg total) by mouth daily.  90 tablet  0   Scheduled:  .  ceFAZolin (ANCEF) IV  1 g Intravenous Q8H  . docusate sodium  100 mg Oral BID  . enoxaparin (LOVENOX) injection  100 mg Subcutaneous Q12H  . gabapentin  100 mg Oral TID  . lamoTRIgine  200 mg Oral q morning - 10a  . LORazepam  1 mg Oral Daily  . LORazepam  2 mg Oral QHS  . multivitamin with minerals  1 tablet Oral Daily  . pantoprazole  40 mg Oral Daily  . QUEtiapine  400 mg Oral QHS  . sertraline  200 mg Oral q morning - 10a  . simvastatin  20 mg Oral q1800  . vitamin C  1,000 mg Oral Daily   Assessment: 67 y.o morbidly obese female with h/o PE/ L. DVT continues on Lovenox 100 mg SQ q12h per Dr. Gaylyn Rong, her hematogist. She is getting ~ 0.67 mg/kg q12 which is < normal treatment dosing.  Recently taken off of coumadin by Dr. Gaylyn Rong due to difficulty regulating INR and started on lovenox (lifelong).  Her PLTC =229K on 06/25/12.   SCr = 0.7 on 06/17/12 , estimated CrCl ~ 100 ml/ min. No bleeding noted.   POD#3 left thumb tendon transfer due to left thumb tendon rupture s/p healed left distal radius fracture.   Pharmacist outpatient note on 06/17/12 indicated Dr. Gaylyn Rong, her hematologist,  stated she is not a candidate for xarelto with hx of GI bleeds and monthly procedures.   PMH: PE '09, DVT 05/2010   Goal of Therapy:  Therapeutic heparin levels (0.6 - 1.0 four hours after dosing) Monitor platelets by anticoagulation protocol: Yes   Plan:  1.  Will get a 4 hour level just to ensure therapeutic response x 1 and discuss with hematologist if < 0.6 Iu/ml. Continue Lovenox 100 mg SQ q12h  Monitor CBC q72h and SCR weekly  Nadara Mustard, PharmD., MS Clinical Pharmacist Pager:  [phone] Thank you for allowing pharmacy to be part of this patients care team. 06/28/2012,10:46 AM

## 2012-06-28 NOTE — Progress Notes (Signed)
Patient discharged in stable condition via ambulance to SNF. 

## 2012-06-28 NOTE — Progress Notes (Signed)
Clinical social worker assisted with patient discharge to skilled nursing facility, [facility].  CSW addressed all family questions and concerns. CSW copied chart and added all important documents. CSW also set up patient transportation with [employer]. Clinical Social Worker will sign off for now as social work intervention is no longer needed.   Sabino Niemann, MSW,  [phone]

## 2012-06-29 ENCOUNTER — Non-Acute Institutional Stay (SKILLED_NURSING_FACILITY): Payer: PRIVATE HEALTH INSURANCE | Admitting: Internal Medicine

## 2012-06-29 ENCOUNTER — Other Ambulatory Visit: Payer: Self-pay | Admitting: *Deleted

## 2012-06-29 DIAGNOSIS — Z4789 Encounter for other orthopedic aftercare: Secondary | ICD-10-CM

## 2012-06-29 DIAGNOSIS — IMO0002 Reserved for concepts with insufficient information to code with codable children: Secondary | ICD-10-CM

## 2012-06-29 DIAGNOSIS — M1710 Unilateral primary osteoarthritis, unspecified knee: Secondary | ICD-10-CM

## 2012-06-29 DIAGNOSIS — I2782 Chronic pulmonary embolism: Secondary | ICD-10-CM

## 2012-06-29 DIAGNOSIS — K31819 Angiodysplasia of stomach and duodenum without bleeding: Secondary | ICD-10-CM

## 2012-06-29 DIAGNOSIS — M1711 Unilateral primary osteoarthritis, right knee: Secondary | ICD-10-CM

## 2012-06-29 MED ORDER — LORAZEPAM 1 MG PO TABS: ORAL_TABLET | ORAL | Status: DC

## 2012-06-29 MED ORDER — OXYCODONE-ACETAMINOPHEN 10-325 MG PO TABS: 1.0000 | ORAL_TABLET | ORAL | Status: DC | PRN

## 2012-06-29 MED ORDER — OXYCODONE-ACETAMINOPHEN 5-325 MG PO TABS: ORAL_TABLET | ORAL | Status: DC

## 2012-06-29 MED ORDER — OXYCODONE-ACETAMINOPHEN 10-325 MG PO TABS: ORAL_TABLET | ORAL | Status: DC

## 2012-07-08 ENCOUNTER — Telehealth: Payer: Self-pay | Admitting: *Deleted

## 2012-07-08 NOTE — Telephone Encounter (Signed)
Call from [facility] with [facility] - # [phone] ex [phone] She was referred to the Heart Failure Program after being in hospital.  They want to clarify if pt is still being treated for CHF. She is not on lasix, Beta Blocker or ACE With the CHF program pt is sent a scale for home weights, call from nurse to talked about CHF and meds. If this program does not fit the pt's needs they will dis-enroll.  Please advise

## 2012-07-09 ENCOUNTER — Encounter: Payer: Self-pay | Admitting: Adult Health

## 2012-07-09 ENCOUNTER — Non-Acute Institutional Stay (SKILLED_NURSING_FACILITY): Payer: PRIVATE HEALTH INSURANCE | Admitting: Adult Health

## 2012-07-09 DIAGNOSIS — S62102S Fracture of unspecified carpal bone, left wrist, sequela: Secondary | ICD-10-CM

## 2012-07-09 DIAGNOSIS — I82409 Acute embolism and thrombosis of unspecified deep veins of unspecified lower extremity: Secondary | ICD-10-CM

## 2012-07-09 DIAGNOSIS — IMO0001 Reserved for inherently not codable concepts without codable children: Secondary | ICD-10-CM

## 2012-07-09 DIAGNOSIS — S42309S Unspecified fracture of shaft of humerus, unspecified arm, sequela: Secondary | ICD-10-CM

## 2012-07-09 DIAGNOSIS — M797 Fibromyalgia: Secondary | ICD-10-CM

## 2012-07-09 NOTE — Progress Notes (Signed)
Patient ID: [name], female   DOB: August 06, 1945, 67 y.o.   MRN: [id number]  FACILITY: [facility]  Allergies  Allergen Reactions  . Adhesive (Tape) Other (See Comments)    "I break out all over"; Can only use paper tape  . Codeine Other (See Comments)    "first dose I took, my aide found me the next day laying on the floor"  . Morphine Other (See Comments)    caused change in mental status, she does not want to try it again.  . Sulfonamide Derivatives Nausea And Vomiting     Chief Complaint  Patient presents with  . Discharge Note    HPI: She is being discharged to home with home health for cna; will not need dme; will need prescriptions to be written. She had been hospitalized for a ruptured left thumb tendon. She was here for short term rehab and is ready for discharge to home.   Past Medical History  Diagnosis Date  . Chronic obstructive pulmonary disease   . Depression     followed by Dr. Gwyndolyn Kaufman, [facility]  . Gastroesophageal reflux disease   . Hypertension   . Hyperlipidemia   . Interstitial cystitis     followed by Dr. Isabel Caprice  . Irritable bowel syndrome     Colonoscopy 12/2003 and EGD 1997 WNL  . Spinal stenosis     L3-L4, L4-L5 per MRI 12/2004, s/p decompression 02/2007 by Dr. Gerlene Fee, residual chronic low back pain  . 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 Obstructive sleep apnea     Noncompliant with CPAP "ineffective"  . Anxiety   . 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 Elnoria Howard  . History of blood transfusion     during GAVE associated GI bleed  . Cataract     Left eye pending surgery  . Clotting disorder 2009    LLE DVT and PE (1 month apart)  . Fibromyalgia   . Incontinent of urine     at night  . Complication of anesthesia     Hard time putting patient to sleep  . Insomnia   . Shortness of breath   . Edema leg     left  . Family history of anesthesia complication     "Mama couldn't wake up easy"  (06/24/2012)  . CHF (congestive heart failure)   . DVT (deep venous thrombosis)     "left" (06/24/2012)  . Pneumonia     "double once year" (06/24/2012)  . Chronic bronchitis     "I forever have bronchitis" (06/24/2012)  . Anemia   . Daily headache     "just about" (06/24/2012)  . Bell's palsy 1980's  . Arthritis     "I'm eat up w/it" (06/24/2012)  . Chronic lower back pain   . 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 Breast cancer     Bilateral s/p mastectomies  . On home oxygen therapy     "2L prn" (06/24/2012)    Past Surgical History  Procedure Laterality Date  . Cervical discectomy  11/1995 and 05/2002    by Dr. Gerlene Fee  . Abdominal hysterectomy  1976  . Bladder suspension  1993  . Orif ankle fracture  1993  . Lumbar laminectomy/decompression microdiscectomy  02/2007    by Dr. Gerlene Fee  . Carpal tunnel release Left   . Tonsillectomy and adenoidectomy  1953  . Appendectomy    . Dilation  and curettage of uterus    . Tubal ligation    . Carpectomy  07/23/2011    Procedure: CARPECTOMY;  Surgeon: Sharma Covert, MD;  Location: [facility] OR;  Service: Orthopedics;  Laterality: Left;  LEFT WRIST PROXIMAL ROW CARPECTOMY  . Esophagogastroduodenoscopy  12/25/2011    Procedure: ESOPHAGOGASTRODUODENOSCOPY (EGD);  Surgeon: Theda Belfast, MD;  Location: [facility] ENDOSCOPY;  Service: Endoscopy;  Laterality: N/A;  . Breast surgery  1992    Bilateral mastectomies for breast cancer  . Rhinoplasty  1993  . Spine surgery    . Hot hemostasis  02/06/2012    Procedure: HOT HEMOSTASIS (ARGON PLASMA COAGULATION/BICAP);  Surgeon: Theda Belfast, MD;  Location: [facility] ENDOSCOPY;  Service: Endoscopy;  Laterality: N/A;  . Esophagogastroduodenoscopy  02/06/2012    Procedure: ESOPHAGOGASTRODUODENOSCOPY (EGD);  Surgeon: Theda Belfast, MD;  Location: [facility] ENDOSCOPY;  Service: Endoscopy;  Laterality: N/A;  . Esophagogastroduodenoscopy N/A 04/02/2012    Procedure: ESOPHAGOGASTRODUODENOSCOPY (EGD);  Surgeon: Theda Belfast, MD;  Location: [facility] ENDOSCOPY;  Service: Endoscopy;  Laterality: N/A;  EGD w/ APC  . Back surgery      x 3  . Tendon transfer Left 06/24/2012    "thumb" (06/24/2012)  . Mastectomy Bilateral   . Melanoma excision Left     "side of my nose" (06/24/2012)  . Tendon transfer Left 06/24/2012    Procedure: LEFT THUMB EIP TO EPL TENDON TRANSFER;  Surgeon: Sharma Covert, MD;  Location: [facility] OR;  Service: Orthopedics;  Laterality: Left;    VITAL SIGNS BP 126/68  Pulse 70  Ht 5\' 6"  (1.676 m)  Wt 330 lb (149.687 kg)  BMI 53.29 kg/m2   Patient's Medications  New Prescriptions   No medications on file  Previous Medications   ALBUTEROL (PROVENTIL) (5 MG/ML) 0.5% NEBULIZER SOLUTION    Take 2.5 mg by nebulization every 6 (six) hours as needed for wheezing or shortness of breath.   DOCUSATE SODIUM (COLACE) 100 MG CAPSULE    Take 1 capsule (100 mg total) by mouth 2 (two) times daily.   ENOXAPARIN (LOVENOX) 100 MG/ML INJECTION    Inject 1 mL (100 mg total) into the skin every 12 (twelve) hours.   FERROUS SULFATE 325 (65 FE) MG EC TABLET    Take 325 mg by mouth 3 (three) times daily with meals.   GABAPENTIN (NEURONTIN) 100 MG CAPSULE    Take 100 mg by mouth 3 (three) times daily.    IPRATROPIUM (ATROVENT) 0.02 % NEBULIZER SOLUTION    Take 0.5 mg by nebulization 3 (three) times daily as needed for wheezing.   LAMOTRIGINE (LAMICTAL) 200 MG TABLET    Take 200 mg by mouth every morning.   LORAZEPAM (ATIVAN) 1 MG TABLET    Take one tablet twice a day   OXYCODONE-ACETAMINOPHEN (ROXICET) 5-325 MG PER TABLET    Take 1 to 2 tablets every 4 hours as needed for pain   PANTOPRAZOLE (PROTONIX) 40 MG TABLET    Take 40 mg by mouth daily.   PRAVASTATIN (PRAVACHOL) 40 MG TABLET    Take 40 mg by mouth every morning.   QUETIAPINE (SEROQUEL) 200 MG TABLET    Take 400 mg by mouth at bedtime.    SERTRALINE (ZOLOFT) 100 MG TABLET    Take 200 mg by mouth every morning.    TRAZODONE (DESYREL) 100 MG TABLET    Take 100 mg by mouth  at bedtime.   VITAMIN C (ASCORBIC ACID) 500 MG TABLET  Take 1 tablet (500 mg total) by mouth daily.  Modified Medications   No medications on file  Discontinued Medications   OXYCODONE-ACETAMINOPHEN (PERCOCET) 10-325 MG PER TABLET    Take 1 tablet every 4 hours as needed   OXYCODONE-ACETAMINOPHEN (PERCOCET) 10-325 MG PER TABLET    Take 1-2 tablets by mouth every 4 (four) hours as needed. Take 1 tablet every 4 hours as needed   VITAMIN C (ASCORBIC ACID) 500 MG TABLET    Take 1 tablet (500 mg total) by mouth daily.    SIGNIFICANT DIAGNOSTIC EXAMS     Component Value Date/Time   ALBUMIN 3.2* 04/12/2012 1214   ALBUMIN 2.7* 12/24/2011 0120   AST 11 04/12/2012 1214   AST 9 12/24/2011 0120   ALT 7 04/12/2012 1214   ALT 5 12/24/2011 0120   ALKPHOS 128 04/12/2012 1214   ALKPHOS 91 12/24/2011 0120   BILITOT 0.21 04/12/2012 1214   BILITOT 0.2* 12/24/2011 0120       Component Value Date/Time   BUN 6.5* 06/17/2012 1341   BUN 16 12/29/2011 0629   GLUCOSE 90 06/17/2012 1341   GLUCOSE 116* 12/29/2011 0629   CREATININE 0.63 06/28/2012 0400   CREATININE 0.7 06/17/2012 1341   CREATININE 0.73 05/01/2011 1414   K 3.6 06/17/2012 1341   K 4.5 12/29/2011 0629   NA 141 06/17/2012 1341   NA 138 12/29/2011 0629   TSH 0.618 12/23/2011 0200       Component Value Date/Time   WBC 5.6 06/28/2012 0400   WBC 7.5 06/17/2012 1341   RBC 4.68 06/28/2012 0400   RBC 5.12 06/17/2012 1341   HGB 12.3 06/28/2012 0400   HGB 13.0 06/17/2012 1341   HCT 39.3 06/28/2012 0400   HCT 40.9 06/17/2012 1341   PLT 216 06/28/2012 0400   PLT 271 06/17/2012 1341   MCV 84.0 06/28/2012 0400   MCV 79.9 06/17/2012 1341     Review of Systems  Constitutional: Negative for malaise/fatigue.  Respiratory: Negative for cough and shortness of breath.   Cardiovascular: Negative for chest pain and leg swelling.  Gastrointestinal: Negative for heartburn and abdominal pain.  Musculoskeletal: Negative for myalgias and joint pain.  Skin: Negative.   Neurological:  Negative for weakness and headaches.  Psychiatric/Behavioral: Negative for depression.   Physical Exam  Constitutional: She is oriented to person, place, and time. She appears well-developed and well-nourished.  Morbid obesity  Neck: Neck supple. No thyromegaly present.  Cardiovascular: Normal rate, regular rhythm and intact distal pulses.   Respiratory: Breath sounds normal. She is in respiratory distress.  GI: Soft. Bowel sounds are normal. She exhibits no distension.  Musculoskeletal: Normal range of motion. She exhibits no edema.  Left hand in cast  Neurological: She is alert and oriented to person, place, and time.  Skin: Skin is warm and dry.  Psychiatric: She has a normal mood and affect.      ASSESSMENT/ PLAN:  Left wrist fracture; fibromyalgia; dvt   Will discharge to home with home health for cna; will not need dme; prescriptions written.   Time spent with patient: 40 minutes

## 2012-07-12 NOTE — Telephone Encounter (Signed)
I am not treating her for CHF so I agree she does not need to be in the CHF program and I support the decision to dis-enroll her from it.  Thank you for contacting the appropriate individuals with our approval of dis-enrolling her.

## 2012-07-13 NOTE — Progress Notes (Signed)
Patient ID: [name], female   DOB: [birth year], 67 y.o.   MRN: [id number]           HISTORY & PHYSICAL  DATE:  06/29/2012  FACILITY: [facility]    LEVEL OF CARE:   SNF   CHIEF COMPLAINT:  Admission to SNF, post stay at [facility], 06/24/2012 through 06/28/2012.    HISTORY OF PRESENT ILLNESS:  This is a patient who  apparently ruptured her extensor pollicis longus.  She was admitted for elective surgical repair of this with a left tendon transfer.  This was done on 06/24/2012.  I am not quite certain how this occurred.  However, she appears to have had an unremarkable postoperative course.  She has a multitude of medical issues that are going to be listed below.  She was seen by Physical Therapy, however, who felt uncomfortable with her going home on her own.  She is, therefore, here for rehabilitation for returning to her independent apartment.      PAST MEDICAL HISTORY:  COPD.    Depression.    Gastroesophageal reflux disease.    Hypertension.    Hyperlipidemia.    Chronic interstitial cystitis with urinary frequency and incontinence.      Irritable bowel syndrome.    Spinal stenosis, status post decompression in February 2009.    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   GAVE syndrome.   Apparently admitted in December 2013 with a very low hemoglobin requiring multiple transfusions.    Fibromyalgia.    Insomnia.    CHF.    Pneumonia and chronic bronchitis,  apparently admitted frequently in the past for this.    Daily headache.    History of Bell's palsy.    Osteoarthritis.    Melanoma of the nose.    Bilateral mastectomies for breast cancer.    On home oxygen 2 L "p.r.n."  CURRENT MEDICATIONS:    Oxycodone/acetaminophen 10/325, 1 p.o. q.4.  Colace 100 b.i.d.   Vitamin C 500 daily.   Albuterol nebulizers 2.5 q.6 p.r.n.   Enoxaparin 100 mg q.12 h (patient states that it is her hematologist's intention that she be on  this now indefinitely, although I do not exactly see verification of this; nevertheless, she is not on Coumadin).    Ferrous sulfate 325 three times daily.    Atrovent 0.5 three times daily p.r.n.   Lamictal 200 mg every morning.   Ativan 1-2 mg two times daily.   Neurontin 100  t.i.d.   Protonix 40 q.d.   Pravastatin 40 q.d.   Seroquel 400 mg at bedtime.   Zoloft 200 mg in the morning.    SOCIAL HISTORY:   HOUSING:  The patient lives at [employer] which I believe is subsidized housing.  This means that people are usually fairly independent.  She has an in-home aide who comes in every day.   TOBACCO USE:  She is a smoker.   FUNCTIONAL STATUS:  She uses a walker at home, a wheelchair for when she leaves, although she states that "[facility] wants my wheelchair".    REVIEW OF SYSTEMS:   CHEST/RESPIRATORY:  She is not excessively short of breath.   CARDIAC:   No chest pain.   GI:  No nausea, vomiting or diarrhea.  She has not noticed any rectal bleeding.   GU:  She has nocturnal urinary incontinence.   MUSCULOSKELETAL:  Difficulty with walking secondary to significant arthritis.    PHYSICAL EXAMINATION:  VITAL SIGNS:   PULSE:  72.   RESPIRATIONS:  18. GENERAL APPEARANCE:  The patient is alert, cooperative.  CHEST/RESPIRATORY:  Fairly clear air entry bilaterally.   There are no crackles or wheezes.   CARDIOVASCULAR:  CARDIAC:   Heart sounds are normal.  There are no murmurs or bruits.  No signs of CHF.   GASTROINTESTINAL:  ABDOMEN:   Obese.   LIVER/SPLEEN/KIDNEYS:  No liver, no spleen.  No tenderness.  No masses.   GENITOURINARY:  BLADDER:   No bladder distention.   MUSCULOSKELETAL:   EXTREMITIES:   BILATERAL LOWER EXTREMITIES:  Very significant osteoarthritis of the left greater than right knee.   CIRCULATION:  EDEMA/VARICOSITIES:  Mild edema.   ARTERIAL:  Peripheral pulses are palpable.   NEUROLOGICAL:   BALANCE/GAIT:  I did not attempt to ambulate her.  She  has her left lower arm and hand in a soft cast. PSYCHIATRIC:   MENTAL STATUS:   I see no current overt abnormalities.   Depression seems well controlled.  Cognitively intact.    ASSESSMENT/PLAN:  Status post surgery on her left thumb with a tendon transplant.  The exact nature of her injury is unclear.  Nevertheless, it has left a difficult situation in terms of using a walker which the patient uses at home.      Now on longstanding Lovenox per her hematologist, Dr. Gaylyn Rong.    Apparently, they just simply could not get her INR stabilized, which seems to be the tone of the notes from the consultant pharmacist in the hospital.   I am not quite certain why something else was not considered.  However, the tone of the discharge summary is fairly clear that she is to be on longstanding Lovenox.    Admission late last year with GAVE syndrome.  She is on proton pump inhibitors.  We will check her hemoglobin at some point.   COPD.  This is significant, but stable.  Her exam at the bedside was stable.    History of CHF.  I see no evidence of this.    Osteoarthritis of the knees.  She tells me she ambulates with some difficulty.  I did not attempt to walk her today.  This may be the major reason she is here.    Significant psychiatric problems.  Listed as depression in the notes that I have looked at.  However, there is not a clear reason for the large dose of Seroquel.  Nevertheless, as the patient is only here short-term, I am not planning to alter this without more clear information on the status of her psychiatric problem.      Hyperlipidemia.  On Pravachol.    The patient appears stable from her multitude of medical issues as well as her psychiatric issues.  She may need to have some adjustments to her walker if her left hand is to remain immobilized.  She follows with [name].  CPT CODE: 16109

## 2012-07-14 NOTE — Telephone Encounter (Signed)
RN called and informed that pt is not being treated for CHF at this time

## 2012-07-16 ENCOUNTER — Ambulatory Visit: Payer: PRIVATE HEALTH INSURANCE | Admitting: Oncology

## 2012-07-16 ENCOUNTER — Other Ambulatory Visit: Payer: PRIVATE HEALTH INSURANCE | Admitting: Lab

## 2012-07-21 ENCOUNTER — Ambulatory Visit: Payer: PRIVATE HEALTH INSURANCE | Admitting: Internal Medicine

## 2012-07-28 ENCOUNTER — Ambulatory Visit: Payer: PRIVATE HEALTH INSURANCE | Admitting: Oncology

## 2012-07-28 ENCOUNTER — Other Ambulatory Visit: Payer: PRIVATE HEALTH INSURANCE | Admitting: Lab

## 2012-08-02 ENCOUNTER — Other Ambulatory Visit: Payer: Self-pay | Admitting: Gastroenterology

## 2012-08-06 ENCOUNTER — Encounter (HOSPITAL_COMMUNITY): Admission: RE | Payer: Self-pay | Source: Ambulatory Visit

## 2012-08-06 ENCOUNTER — Ambulatory Visit (HOSPITAL_COMMUNITY)
Admission: RE | Admit: 2012-08-06 | Payer: PRIVATE HEALTH INSURANCE | Source: Ambulatory Visit | Admitting: Gastroenterology

## 2012-08-06 SURGERY — EGD (ESOPHAGOGASTRODUODENOSCOPY)
Anesthesia: Moderate Sedation

## 2012-08-12 ENCOUNTER — Encounter: Payer: Self-pay | Admitting: Internal Medicine

## 2012-08-12 NOTE — Progress Notes (Signed)
Patient ID: [name], female   DOB: June 29, 1945, 67 y.o.   MRN: [id number] No show

## 2012-08-17 ENCOUNTER — Ambulatory Visit: Payer: PRIVATE HEALTH INSURANCE | Admitting: Oncology

## 2012-08-17 ENCOUNTER — Other Ambulatory Visit: Payer: Self-pay | Admitting: *Deleted

## 2012-08-17 ENCOUNTER — Other Ambulatory Visit: Payer: PRIVATE HEALTH INSURANCE | Admitting: Lab

## 2012-08-17 DIAGNOSIS — E785 Hyperlipidemia, unspecified: Secondary | ICD-10-CM

## 2012-08-17 MED ORDER — PRAVASTATIN SODIUM 40 MG PO TABS: 40.0000 mg | ORAL_TABLET | Freq: Every morning | ORAL | Status: DC

## 2012-09-09 ENCOUNTER — Encounter (HOSPITAL_COMMUNITY): Payer: Self-pay | Admitting: Emergency Medicine

## 2012-09-09 ENCOUNTER — Emergency Department (HOSPITAL_COMMUNITY)
Admission: EM | Admit: 2012-09-09 | Discharge: 2012-09-09 | Disposition: A | Discharge status: Home / Self Care | Payer: PRIVATE HEALTH INSURANCE | Attending: Emergency Medicine | Admitting: Emergency Medicine

## 2012-09-09 DIAGNOSIS — Z9851 Tubal ligation status: Secondary | ICD-10-CM | POA: Insufficient documentation

## 2012-09-09 DIAGNOSIS — R112 Nausea with vomiting, unspecified: Secondary | ICD-10-CM | POA: Insufficient documentation

## 2012-09-09 DIAGNOSIS — G4700 Insomnia, unspecified: Secondary | ICD-10-CM | POA: Insufficient documentation

## 2012-09-09 DIAGNOSIS — Z86711 Personal history of pulmonary embolism: Secondary | ICD-10-CM | POA: Insufficient documentation

## 2012-09-09 DIAGNOSIS — Z8719 Personal history of other diseases of the digestive system: Secondary | ICD-10-CM | POA: Insufficient documentation

## 2012-09-09 DIAGNOSIS — Z9981 Dependence on supplemental oxygen: Secondary | ICD-10-CM | POA: Insufficient documentation

## 2012-09-09 DIAGNOSIS — F17200 Nicotine dependence, unspecified, uncomplicated: Secondary | ICD-10-CM | POA: Insufficient documentation

## 2012-09-09 DIAGNOSIS — R109 Unspecified abdominal pain: Secondary | ICD-10-CM

## 2012-09-09 DIAGNOSIS — Z79899 Other long term (current) drug therapy: Secondary | ICD-10-CM | POA: Insufficient documentation

## 2012-09-09 DIAGNOSIS — D689 Coagulation defect, unspecified: Secondary | ICD-10-CM | POA: Insufficient documentation

## 2012-09-09 DIAGNOSIS — M545 Low back pain, unspecified: Secondary | ICD-10-CM | POA: Insufficient documentation

## 2012-09-09 DIAGNOSIS — Z8739 Personal history of other diseases of the musculoskeletal system and connective tissue: Secondary | ICD-10-CM | POA: Insufficient documentation

## 2012-09-09 DIAGNOSIS — Z85820 Personal history of malignant melanoma of skin: Secondary | ICD-10-CM | POA: Insufficient documentation

## 2012-09-09 DIAGNOSIS — K921 Melena: Secondary | ICD-10-CM | POA: Insufficient documentation

## 2012-09-09 DIAGNOSIS — Z91199 Patient's noncompliance with other medical treatment and regimen due to unspecified reason: Secondary | ICD-10-CM | POA: Insufficient documentation

## 2012-09-09 DIAGNOSIS — Z86718 Personal history of other venous thrombosis and embolism: Secondary | ICD-10-CM | POA: Insufficient documentation

## 2012-09-09 DIAGNOSIS — Z9089 Acquired absence of other organs: Secondary | ICD-10-CM | POA: Insufficient documentation

## 2012-09-09 DIAGNOSIS — N390 Urinary tract infection, site not specified: Secondary | ICD-10-CM | POA: Insufficient documentation

## 2012-09-09 DIAGNOSIS — M129 Arthropathy, unspecified: Secondary | ICD-10-CM | POA: Insufficient documentation

## 2012-09-09 DIAGNOSIS — Z8669 Personal history of other diseases of the nervous system and sense organs: Secondary | ICD-10-CM | POA: Insufficient documentation

## 2012-09-09 DIAGNOSIS — Z90710 Acquired absence of both cervix and uterus: Secondary | ICD-10-CM | POA: Insufficient documentation

## 2012-09-09 DIAGNOSIS — D649 Anemia, unspecified: Secondary | ICD-10-CM | POA: Insufficient documentation

## 2012-09-09 DIAGNOSIS — Z87448 Personal history of other diseases of urinary system: Secondary | ICD-10-CM | POA: Insufficient documentation

## 2012-09-09 DIAGNOSIS — Z8701 Personal history of pneumonia (recurrent): Secondary | ICD-10-CM | POA: Insufficient documentation

## 2012-09-09 DIAGNOSIS — Z8709 Personal history of other diseases of the respiratory system: Secondary | ICD-10-CM | POA: Insufficient documentation

## 2012-09-09 DIAGNOSIS — G4733 Obstructive sleep apnea (adult) (pediatric): Secondary | ICD-10-CM | POA: Insufficient documentation

## 2012-09-09 DIAGNOSIS — I509 Heart failure, unspecified: Secondary | ICD-10-CM | POA: Insufficient documentation

## 2012-09-09 DIAGNOSIS — Z8679 Personal history of other diseases of the circulatory system: Secondary | ICD-10-CM | POA: Insufficient documentation

## 2012-09-09 DIAGNOSIS — Z853 Personal history of malignant neoplasm of breast: Secondary | ICD-10-CM | POA: Insufficient documentation

## 2012-09-09 DIAGNOSIS — IMO0001 Reserved for inherently not codable concepts without codable children: Secondary | ICD-10-CM | POA: Insufficient documentation

## 2012-09-09 DIAGNOSIS — Z9889 Other specified postprocedural states: Secondary | ICD-10-CM | POA: Insufficient documentation

## 2012-09-09 DIAGNOSIS — I10 Essential (primary) hypertension: Secondary | ICD-10-CM | POA: Insufficient documentation

## 2012-09-09 DIAGNOSIS — R1084 Generalized abdominal pain: Secondary | ICD-10-CM | POA: Insufficient documentation

## 2012-09-09 DIAGNOSIS — Z9119 Patient's noncompliance with other medical treatment and regimen: Secondary | ICD-10-CM | POA: Insufficient documentation

## 2012-09-09 DIAGNOSIS — Z7901 Long term (current) use of anticoagulants: Secondary | ICD-10-CM | POA: Insufficient documentation

## 2012-09-09 DIAGNOSIS — G8929 Other chronic pain: Secondary | ICD-10-CM | POA: Insufficient documentation

## 2012-09-09 LAB — URINALYSIS, ROUTINE W REFLEX MICROSCOPIC
Bilirubin Urine: NEGATIVE
Nitrite: POSITIVE — AB
Specific Gravity, Urine: 1.026 (ref 1.005–1.030)
pH: 6 (ref 5.0–8.0)

## 2012-09-09 LAB — CBC
MCH: 29.6 pg (ref 26.0–34.0)
MCHC: 32.6 g/dL (ref 30.0–36.0)
Platelets: 224 10*3/uL (ref 150–400)

## 2012-09-09 LAB — COMPREHENSIVE METABOLIC PANEL
ALT: 5 U/L (ref 0–35)
AST: 9 U/L (ref 0–37)
Calcium: 8.8 mg/dL (ref 8.4–10.5)
GFR calc Af Amer: >90 mL/min (ref >90)
Sodium: 142 mEq/L (ref 135–145)
Total Protein: 6.1 g/dL (ref 6.0–8.3)

## 2012-09-09 LAB — OCCULT BLOOD, POC DEVICE: Fecal Occult Bld: NEGATIVE

## 2012-09-09 LAB — PROTIME-INR
INR: 1.05 (ref 0.00–1.49)
Prothrombin Time: 13.5 seconds (ref 11.6–15.2)

## 2012-09-09 LAB — URINE MICROSCOPIC-ADD ON

## 2012-09-09 MED ORDER — CEPHALEXIN 500 MG PO CAPS: 500.0000 mg | ORAL_CAPSULE | Freq: Four times a day (QID) | ORAL | Status: DC

## 2012-09-09 NOTE — ED Notes (Signed)
Per EMS pt c/o possible broken vessel in stomach. Pt has hx of this happening before and had to have vessels cauterized. Pt has palpable mass to LLQ, no pain, and slight bruising visualized. Pt states she was having some dark stools but did not see any bleeding. Pt also has had some vomiting, and limited PO intake. Vitals 124/82, 92 % RA, 78-80 pulse. Pt is on O2 as needed. Pt called PCP about sx and was told to come here for further evaluation.

## 2012-09-09 NOTE — ED Notes (Signed)
Pt. Waiting in waiting area for [facility] to arrive to take her home. She is unable to ambulate.

## 2012-09-09 NOTE — ED Provider Notes (Signed)
CSN: [id number]     Arrival date & time 09/09/12  1542 History     First MD Initiated Contact with Patient 09/09/12 1610     Chief Complaint  Patient presents with  . 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 She also c/o n/v for the past 2 or 3 days as well as dark stools at baseline due to PO iron but states they have been becoming more black. No lightheadedness/syncope. No blood in vomit. No fever, diarrhea, urinary sx, hematuria. She called GI office (Dr. Elnoria Howard) and was told to go to ED for eval. The onset of this condition was acute. The course is constant. Aggravating factors: none. Alleviating factors: none.    The history is provided by the patient.    Past Medical History  Diagnosis Date  . Chronic obstructive pulmonary disease   . Depression     followed by Dr. Gwyndolyn Kaufman, [facility]  . Gastroesophageal reflux disease   . Hypertension   . Hyperlipidemia   . Interstitial cystitis     followed by Dr. Isabel Caprice  . Irritable bowel syndrome     Colonoscopy 12/2003 and EGD 1997 WNL  . Spinal stenosis     L3-L4, L4-L5 per MRI 12/2004, s/p decompression 02/2007 by Dr. Gerlene Fee, residual chronic low back pain  . 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 Obstructive sleep apnea     Noncompliant with CPAP "ineffective"  . Anxiety   . 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 Elnoria Howard  . History of blood transfusion     during GAVE associated GI bleed  . Cataract     Left eye pending surgery  . Clotting disorder 2009    LLE DVT and PE (1 month apart)  . Fibromyalgia   . Incontinent of urine     at night  . Complication of anesthesia     Hard time putting patient to sleep  . Insomnia   .  Shortness of breath   . Edema leg     left  . Family history of anesthesia complication     "Mama couldn't wake up easy" (06/24/2012)  . CHF (congestive heart failure)   . DVT (deep venous thrombosis)     "left" (06/24/2012)  . Pneumonia     "double once year" (06/24/2012)  . Chronic bronchitis     "I forever have bronchitis" (06/24/2012)  . Anemia   . Daily headache     "just about" (06/24/2012)  . Bell's palsy 1980's  . Arthritis     "I'm eat up w/it" (06/24/2012)  . Chronic lower back pain   . 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 Breast cancer     Bilateral s/p mastectomies  . On home oxygen therapy     "2L prn" (06/24/2012)   Past Surgical History  Procedure Laterality Date  . Cervical discectomy  11/1995 and 05/2002    by Dr. Gerlene Fee  . Abdominal hysterectomy  1976  . Bladder suspension  1993  . Orif ankle fracture  1993  . Lumbar laminectomy/decompression microdiscectomy  02/2007    by Dr. Gerlene Fee  . Carpal tunnel release Left   . Tonsillectomy and adenoidectomy  1953  . Appendectomy    .  Dilation and curettage of uterus    . Tubal ligation    . Carpectomy  07/23/2011    Procedure: CARPECTOMY;  Surgeon: Sharma Covert, MD;  Location: [facility] OR;  Service: Orthopedics;  Laterality: Left;  LEFT WRIST PROXIMAL ROW CARPECTOMY  . Esophagogastroduodenoscopy  12/25/2011    Procedure: ESOPHAGOGASTRODUODENOSCOPY (EGD);  Surgeon: Theda Belfast, MD;  Location: [facility] ENDOSCOPY;  Service: Endoscopy;  Laterality: N/A;  . Breast surgery  1992    Bilateral mastectomies for breast cancer  . Rhinoplasty  1993  . Spine surgery    . Hot hemostasis  02/06/2012    Procedure: HOT HEMOSTASIS (ARGON PLASMA COAGULATION/BICAP);  Surgeon: Theda Belfast, MD;  Location: [facility] ENDOSCOPY;  Service: Endoscopy;  Laterality: N/A;  . Esophagogastroduodenoscopy  02/06/2012    Procedure: ESOPHAGOGASTRODUODENOSCOPY (EGD);  Surgeon: Theda Belfast, MD;  Location: [facility] ENDOSCOPY;  Service: Endoscopy;  Laterality:  N/A;  . Esophagogastroduodenoscopy N/A 04/02/2012    Procedure: ESOPHAGOGASTRODUODENOSCOPY (EGD);  Surgeon: Theda Belfast, MD;  Location: [facility] ENDOSCOPY;  Service: Endoscopy;  Laterality: N/A;  EGD w/ APC  . Back surgery      x 3  . Tendon transfer Left 06/24/2012    "thumb" (06/24/2012)  . Mastectomy Bilateral   . Melanoma excision Left     "side of my nose" (06/24/2012)  . Tendon transfer Left 06/24/2012    Procedure: LEFT THUMB EIP TO EPL TENDON TRANSFER;  Surgeon: Sharma Covert, MD;  Location: [facility] OR;  Service: Orthopedics;  Laterality: Left;   Family History  Problem Relation Age of Onset  . Heart disease Mother   . Deep vein thrombosis Mother   . Cancer - Lung Brother   . Alcohol abuse Father   . Schizophrenia Father   . Anxiety disorder Son   . Healthy Brother   . Healthy Brother    History  Substance Use Topics  . Smoking status: Current Every Day Smoker -- 0.75 packs/day for 40 years    Types: Cigarettes  . Smokeless tobacco: Never Used  . Alcohol Use: No   OB History   Grav Para Term Preterm Abortions TAB SAB Ect Mult Living                 Review of Systems  Constitutional: Negative for fever.  HENT: Negative for sore throat and rhinorrhea.   Eyes: Negative for redness.  Respiratory: Negative for cough and shortness of breath.   Cardiovascular: Negative for chest pain.  Gastrointestinal: Positive for nausea, vomiting, abdominal pain and blood in stool (black stool). Negative for diarrhea.  Genitourinary: Negative for dysuria.  Musculoskeletal: Negative for myalgias.  Skin: Negative for rash.  Neurological: Negative for headaches.    Allergies  Adhesive; Codeine; Morphine; and Sulfonamide derivatives  Home Medications   Current Outpatient Rx  Name  Route  Sig  Dispense  Refill  . albuterol (PROVENTIL) (5 MG/ML) 0.5% nebulizer solution   Nebulization   Take 2.5 mg by nebulization every 6 (six) hours as needed for wheezing or shortness of breath.         .  docusate sodium (COLACE) 100 MG capsule   Oral   Take 1 capsule (100 mg total) by mouth 2 (two) times daily.   30 capsule   0   . enoxaparin (LOVENOX) 100 MG/ML injection   Subcutaneous   Inject 1 mL (100 mg total) into the skin every 12 (twelve) hours.   60 Syringe   2   . ferrous sulfate  325 (65 FE) MG EC tablet   Oral   Take 325 mg by mouth 3 (three) times daily with meals.         . gabapentin (NEURONTIN) 100 MG capsule   Oral   Take 100 mg by mouth 3 (three) times daily.          Marland Kitchen ipratropium (ATROVENT) 0.02 % nebulizer solution   Nebulization   Take 0.5 mg by nebulization 3 (three) times daily as needed for wheezing.         . lamoTRIgine (LAMICTAL) 200 MG tablet   Oral   Take 200 mg by mouth every morning.         Marland Kitchen LORazepam (ATIVAN) 1 MG tablet      Take one tablet twice a day   60 tablet   5   . oxyCODONE-acetaminophen (ROXICET) 5-325 MG per tablet      Take 1 to 2 tablets every 4 hours as needed for pain   360 tablet   0   . pantoprazole (PROTONIX) 40 MG tablet   Oral   Take 40 mg by mouth daily.         . pravastatin (PRAVACHOL) 40 MG tablet   Oral   Take 1 tablet (40 mg total) by mouth every morning.   90 tablet   3   . QUEtiapine (SEROQUEL) 200 MG tablet   Oral   Take 400 mg by mouth at bedtime.          . sertraline (ZOLOFT) 100 MG tablet   Oral   Take 200 mg by mouth every morning.          . traZODone (DESYREL) 100 MG tablet   Oral   Take 100 mg by mouth at bedtime.         . vitamin C (ASCORBIC ACID) 500 MG tablet   Oral   Take 1 tablet (500 mg total) by mouth daily.   90 tablet   0    BP 120/53  Pulse 76  Temp(Src) 98.5 F (36.9 C) (Oral)  Resp 18  Ht 5\' 7"  (1.702 m)  Wt 283 lb (128.368 kg)  BMI 44.31 kg/m2  SpO2 92% Physical Exam  Nursing note and vitals reviewed. Constitutional: She appears well-developed and well-nourished.  HENT:  Head: Normocephalic and atraumatic.  Eyes: Conjunctivae are  normal. Pupils are equal, round, and reactive to light. Right eye exhibits no discharge. Left eye exhibits no discharge.  Neck: Normal range of motion. Neck supple.  Cardiovascular: Normal rate, regular rhythm and normal heart sounds.   Pulmonary/Chest: Effort normal and breath sounds normal.  Abdominal: Soft. Bowel sounds are normal. There is tenderness (generalized). There is no rebound and no guarding.  Two small hematomas L and R lower abd where injections placed.   Neurological: She is alert.  Skin: Skin is warm and dry.  Psychiatric: She has a normal mood and affect.    ED Course   Procedures (including critical care time)  Labs Reviewed  CBC - Abnormal; Notable for the following:    RDW 16.0 (*)    All other components within normal limits  COMPREHENSIVE METABOLIC PANEL - Abnormal; Notable for the following:    Glucose, Bld 109 (*)    Albumin 3.2 (*)    Total Bilirubin 0.2 (*)    GFR calc non Af Amer 87 (*)    All other components within normal limits  URINALYSIS, ROUTINE W REFLEX MICROSCOPIC - Abnormal; Notable for the  following:    APPearance CLOUDY (*)    Hgb urine dipstick TRACE (*)    Nitrite POSITIVE (*)    Leukocytes, UA MODERATE (*)    All other components within normal limits  URINE MICROSCOPIC-ADD ON - Abnormal; Notable for the following:    Squamous Epithelial / LPF FEW (*)    Bacteria, UA MANY (*)    Casts HYALINE CASTS (*)    All other components within normal limits  URINE CULTURE  PROTIME-INR  OCCULT BLOOD X 1 CARD TO LAB, STOOL  OCCULT BLOOD, POC DEVICE   No results found. 1. Abdominal pain   2. Urinary tract infection     4:29 PM Patient seen and examined. Work-up initiated. Medications ordered.   Vital signs reviewed and are as follows: Filed Vitals:   09/09/12 1549  BP: 120/53  Pulse: 76  Temp: 98.5 F (36.9 C)  Resp: 18   7:47 PM Patient informed of results. Pt d/w and seen by Dr. Juleen China. Patient safe for d/c.   The patient was  urged to return to the Emergency Department immediately with worsening of current symptoms, worsening abdominal pain, persistent vomiting, blood noted in stools, fever, or any other concerns. The patient verbalized understanding.   Pt asked to f/u with her GI physician.   MDM  Patient with h/o GAVE -- generalized tenderness on exam today. Pt seems most concerned with hematoma in area where lovenox injected. Her hgb is normal. Hemoccult negative. UA shows nitrite + UTI -- rx for keflex given. Labs otherwise unconcerning. Do not suspect other significant intraabdominal etiology that would require CT scan. Exam stable during ED stay. Pt appears well, non-toxic, with normal movement.   Renne Crigler, PA-C 09/09/12 1949

## 2012-09-09 NOTE — ED Notes (Signed)
[facility] here to take Pt. Home Pt. Updated on Pt. Plan of care.

## 2012-09-12 LAB — URINE CULTURE: Colony Count: >=100000

## 2012-09-13 NOTE — ED Notes (Signed)
+   urine Patient treated with Cephalexin-treated per protocol MD.

## 2012-09-15 NOTE — ED Provider Notes (Signed)
Medical screening examination/treatment/procedure(s) were conducted as a shared visit with non-physician practitioner(s) and myself.  I personally evaluated the patient during the encounter.  Pt with abdominal pain. Likely 2/2 uti. Abx. Pt with concerned over lumps in abdomen in areas of previous lovenos injections. Not consistent with significant hematoma. Possible granulation. H/H and HD stable.   Raeford Razor, MD 09/15/12 [phone]

## 2012-09-22 ENCOUNTER — Encounter: Payer: Self-pay | Admitting: Internal Medicine

## 2012-09-22 ENCOUNTER — Telehealth: Payer: Self-pay | Admitting: Oncology

## 2012-09-22 ENCOUNTER — Ambulatory Visit (INDEPENDENT_AMBULATORY_CARE_PROVIDER_SITE_OTHER): Payer: PRIVATE HEALTH INSURANCE | Admitting: Internal Medicine

## 2012-09-22 VITALS — BP 133/73 | HR 69 | Temp 97.8°F

## 2012-09-22 DIAGNOSIS — I82409 Acute embolism and thrombosis of unspecified deep veins of unspecified lower extremity: Secondary | ICD-10-CM

## 2012-09-22 DIAGNOSIS — N6310 Unspecified lump in the right breast, unspecified quadrant: Secondary | ICD-10-CM

## 2012-09-22 DIAGNOSIS — N63 Unspecified lump in unspecified breast: Secondary | ICD-10-CM

## 2012-09-22 DIAGNOSIS — K31819 Angiodysplasia of stomach and duodenum without bleeding: Secondary | ICD-10-CM

## 2012-09-22 DIAGNOSIS — Z23 Encounter for immunization: Secondary | ICD-10-CM

## 2012-09-22 DIAGNOSIS — Z7901 Long term (current) use of anticoagulants: Secondary | ICD-10-CM

## 2012-09-22 DIAGNOSIS — Z0000 Encounter for general adult medical examination without abnormal findings: Secondary | ICD-10-CM

## 2012-09-22 DIAGNOSIS — D62 Acute posthemorrhagic anemia: Secondary | ICD-10-CM

## 2012-09-22 LAB — CBC
MCH: 29.2 pg (ref 26.0–34.0)
MCHC: 32.7 g/dL (ref 30.0–36.0)
MCV: 89.1 fL (ref 78.0–100.0)
Platelets: 226 10*3/uL (ref 150–400)
RBC: 4.87 MIL/uL (ref 3.87–5.11)
RDW: 15.9 % — ABNORMAL HIGH (ref 11.5–15.5)

## 2012-09-22 NOTE — Assessment & Plan Note (Signed)
Hgb 13 on 8/21.  Hemoccult negative in ED at that time.  Will recheck CBC today.

## 2012-09-22 NOTE — Assessment & Plan Note (Addendum)
Patient has been seen by Dr. Elnoria Howard for this.  However patient reports that Dr. Rolla Etienne office told her she would have to be seen here before an appointment can be given.  [name] from our office called Dr. Haywood Pao office and was told patient has been discharged from practice after missing 3 consecutive appointments, and office will not see patient.  Will refer to another Gastroenterologist.  No signs of active GI bleeding.  Will repeat CBC.

## 2012-09-22 NOTE — Assessment & Plan Note (Signed)
Patient has history of PE and DVT on left.  Was determined to be good canidate for long term anticoagulation.  Patient had difficultly keeping INR theraputic with coumadin.  Patient was seen by Dr. Gaylyn Rong, Hematology on 06/17/12, at that time he felt she was a not doing well on coumadin, not a good canidate for xarelto due to frequent procedures and GI bleeds.  He placed her on Lovenox injections at that time and per his note wanted her to follow up with the Cancer Center.  She has yet to follow up.  She reports discontinuing Lovenox on her own 3-4 weeks ago due to hematomas on her left and right side of her abdomen.  She was seen in the ED because of this and was diagnosed and treated for a UTI.  She denies any urinary symptoms and the hematomas have resolved. Will refer her back to the Cancer Center to make sure she is seen for anticoagulation management. Rivka Barbara in our office called the cancer center and reports she has missed one appointment already but will make appointment for her to be seen on 10/05/12 Patient instructed to continue lovenox until that time. Will recheck CBC today, H/H wnl in ED and Hemoccult negative at that time.

## 2012-09-22 NOTE — Progress Notes (Addendum)
Patient ID: [name], female   DOB: Mar 22, 1945, 67 y.o.   MRN: [id number]   Subjective:   [name]   DOB: 05-May-1945 67 y.o.   MRN: [id number]  HPI: [name] is a 67 y.o. female with an extensive PMH that includes GAVE, PE on chronic A/C.  She presents today for follow up from the ED visit 2 weeks ago.  She reports that she went to the ED because she was concerned about two swollen bruised areas on either side of her abdomen where she was given lovenox injections while she was in rehab after a recent tendon repair surgery.  Per ED note patient also complained of diffuse abdominal pain.  In the ED a U/A was checked and she was diagnosed with a UTI and treated with keflex.  Urine culture has since grown Klebsiella Pneumonia sensitive to keflex.  Patient reports she currently does not have any dysuria and has never had any dysuria, she reports that keflex did give her some loose stools. Patient reports that the bruising has since disappeared from her abdomen but she is still concerned about the areas and has not injected herself with lovenox for about 1 month.  The lovenox was prescribed for recurrent DVT by Dr. Gaylyn Rong and the cancer center after patient had trouble maintained a therapeutic INR, he was hesitant to start patient on Xarelto due to upcoming surgeries and history of GI bleed.  She has yet to follow up with him or his office because "he has moved out of town."   In addition patient is concerned about nodule on her right breast.  She is very concerned that she has a family history of breast cancer and she has noticed this lump develop over the past 2-3 weeks.  She has had a double mastectomy reportedly due to multiple cysts and had implants placed many years ago.  She was referred for annual mammogram earlier this year but test was canceled by imaging center.    Past Medical History  Diagnosis Date  . Chronic obstructive pulmonary disease   .  Depression     followed by Dr. Gwyndolyn Kaufman, [facility]  . Gastroesophageal reflux disease   . Hypertension   . Hyperlipidemia   . Interstitial cystitis     followed by Dr. Isabel Caprice  . Irritable bowel syndrome     Colonoscopy 12/2003 and EGD 1997 WNL  . Spinal stenosis     L3-L4, L4-L5 per MRI 12/2004, s/p decompression 02/2007 by Dr. Gerlene Fee, residual chronic low back pain  . 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 Obstructive sleep apnea     Noncompliant with CPAP "ineffective"  . Anxiety   . 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 Elnoria Howard  . History of blood transfusion     during GAVE associated GI bleed  . Cataract     Left eye pending surgery  . Clotting disorder 2009    LLE DVT and PE (1 month apart)  . Fibromyalgia   . Incontinent of urine     at night  . Complication of anesthesia     Hard time putting patient to sleep  . Insomnia   . Shortness of breath   . Edema leg     left  . Family history of anesthesia complication     "Mama couldn't wake up easy" (06/24/2012)  . CHF (congestive heart failure)   . DVT (deep  venous thrombosis)     "left" (06/24/2012)  . Pneumonia     "double once year" (06/24/2012)  . Chronic bronchitis     "I forever have bronchitis" (06/24/2012)  . Anemia   . Daily headache     "just about" (06/24/2012)  . Bell's palsy 1980's  . Arthritis     "I'm eat up w/it" (06/24/2012)  . Chronic lower back pain   . 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 Breast cancer     Bilateral s/p mastectomies  . On home oxygen therapy     "2L prn" (06/24/2012)   Current Outpatient Prescriptions  Medication Sig Dispense Refill  . cephALEXin (KEFLEX) 500 MG capsule Take 1 capsule (500 mg total) by mouth 4 (four) times daily.  28 capsule  0  . docusate sodium (COLACE) 100 MG capsule Take 1 capsule (100 mg total) by mouth 2 (two) times daily.  30 capsule  0  . ferrous sulfate 325 (65 FE) MG EC tablet Take 325 mg by mouth 3  (three) times daily with meals.      . gabapentin (NEURONTIN) 100 MG capsule Take 100 mg by mouth 3 (three) times daily.       Marland Kitchen lamoTRIgine (LAMICTAL) 200 MG tablet Take 200 mg by mouth every morning.      Marland Kitchen LORazepam (ATIVAN) 1 MG tablet Take 1-2 mg by mouth 2 (two) times daily. Takes 1 tablet in morning and 2 tablets at bedtime      . oxyCODONE-acetaminophen (PERCOCET/ROXICET) 5-325 MG per tablet Take 1 tablet by mouth every 6 (six) hours as needed for pain.      . pantoprazole (PROTONIX) 40 MG tablet Take 40 mg by mouth daily.      . pravastatin (PRAVACHOL) 40 MG tablet Take 1 tablet (40 mg total) by mouth every morning.  90 tablet  3  . QUEtiapine (SEROQUEL) 200 MG tablet Take 400 mg by mouth at bedtime.       . sertraline (ZOLOFT) 100 MG tablet Take 200 mg by mouth every morning.       . vitamin C (ASCORBIC ACID) 500 MG tablet Take 1 tablet (500 mg total) by mouth daily.  90 tablet  0   No current facility-administered medications for this visit.   Family History  Problem Relation Age of Onset  . Heart disease Mother   . Deep vein thrombosis Mother   . Cancer - Lung Brother   . Alcohol abuse Father   . Schizophrenia Father   . Anxiety disorder Son   . Healthy Brother   . Healthy Brother    History   Social History  . Marital Status: Single    Spouse Name: N/A    Number of Children: N/A  . Years of Education: N/A   Social History Main Topics  . Smoking status: Current Every Day Smoker -- 1.00 packs/day for 40 years    Types: Cigarettes  . Smokeless tobacco: Never Used     Comment: Trying to cut down.  . Alcohol Use: No  . Drug Use: No  . Sexual Activity: None     Comment: QUIT ONE MONTH AGO   Other Topics Concern  . None   Social History Narrative  . None   Review of Systems: Review of Systems  Constitutional: Negative for fever, chills, weight loss, malaise/fatigue and diaphoresis.  HENT: Negative for congestion, sore throat and neck pain.   Eyes: Negative  for blurred vision.  Respiratory: Negative for cough, sputum production, shortness of breath and wheezing.   Cardiovascular: Negative for chest pain, orthopnea, claudication and leg swelling.  Gastrointestinal: Positive for abdominal pain (chronic) and melena (reports dark stools due to iron supplementation- unchanged). Negative for heartburn, nausea, vomiting, diarrhea, constipation and blood in stool.  Genitourinary: Negative for dysuria, urgency and frequency.  Musculoskeletal: Negative for myalgias, back pain and joint pain.  Skin: Negative for rash.  Neurological: Negative for dizziness, sensory change, weakness and headaches.    Objective:  Physical Exam: Filed Vitals:   09/22/12 1328  BP: 133/73  Pulse: 69  Temp: 97.8 F (36.6 C)  TempSrc: Oral  SpO2: 96%   Physical Exam  Nursing note and vitals reviewed. Constitutional: She is oriented to person, place, and time and well-developed, well-nourished, and in no distress. No distress.  In wheelchair  HENT:  Head: Normocephalic and atraumatic.  Eyes: Conjunctivae are normal.  Cardiovascular: Normal rate, regular rhythm, normal heart sounds and intact distal pulses.   No murmur heard. Pulmonary/Chest: Effort normal and breath sounds normal. No respiratory distress. She has no wheezes. She has no rales. Right breast exhibits mass.    Abdominal: Soft. Bowel sounds are normal. She exhibits no distension. There is tenderness (mild discomfort with palpation). There is no rebound.  Musculoskeletal: She exhibits no edema.  Neurological: She is alert and oriented to person, place, and time.  Skin: She is not diaphoretic.  Psychiatric: Affect and judgment normal.    Assessment & Plan:   See Problem based Assessment and Plan

## 2012-09-22 NOTE — Patient Instructions (Signed)
1.  Please continue to use Lovenox injections as prescribed by Dr. Gaylyn Rong.  We will try to make an appointment for you with one of the doctors at the [facility]. 2.  Please try to make a follow up appointment with Dr. Haywood Pao office. 3.  The [facility] will contact you about an appointment for Ultrasound of right breast.

## 2012-09-22 NOTE — Telephone Encounter (Signed)
pt called to r/s appt...done °

## 2012-09-22 NOTE — Assessment & Plan Note (Addendum)
Patient has history of B/L mastectomy and implant placement many years ago.  Patient was referred for mammography screening in May by Dr. Josem Kaufmann, apparently that was canceled "per protocol."  However patient reports lump in right breast which has developed over the past 2-3 weeks.  Lump is soft, nontender, about 6cm in diameter.  Will refer to breast center for U/S.  And place order again for screening mammogram.

## 2012-09-23 NOTE — Progress Notes (Signed)
I saw and evaluated the patient.  I personally confirmed the key portions of the history and exam documented by Dr. Mikey Bussing and I reviewed pertinent patient test results.  The assessment, diagnosis, and plan were formulated together and I agree with the documentation in the resident's note, with the following additional comments.  The area of concern the patient's right breast is in the upper medial quadrant; exam shows an approximately 6 cm diameter area of fullness which is soft and nontender; patient has a history of bilateral mastectomy with implants done years ago.  Agree with plan to evaluate with ultrasound at the breast center; depending upon that result, she may need referral for surgical evaluation.

## 2012-09-23 NOTE — Addendum Note (Signed)
Addended by: Carlynn Purl C on: 09/23/2012 01:15 PM   Modules accepted: Orders

## 2012-09-28 ENCOUNTER — Telehealth: Payer: Self-pay | Admitting: Internal Medicine

## 2012-09-28 NOTE — Telephone Encounter (Signed)
Notes from epic printed and given to Dr. Marina Goodell for review. Spoke with Rivka Barbara and let her know he would have to review records and decide if she would be accepted by the practice. Spoke with Dr. Marina Goodell and pt is ok to schedule with a midlevel. Pt scheduled to see Doug Sou PA Friday 10/08/12@10 :30am. [name] to notify pt of appt date and time.

## 2012-09-29 ENCOUNTER — Telehealth: Payer: Self-pay | Admitting: Hematology and Oncology

## 2012-09-29 NOTE — Telephone Encounter (Signed)
Moved 9/16 lb/fu to 9/30 w/NG. S/w pt re change and new d/t for lb/NG 9/30.  °

## 2012-10-05 ENCOUNTER — Ambulatory Visit: Payer: PRIVATE HEALTH INSURANCE

## 2012-10-05 ENCOUNTER — Other Ambulatory Visit: Payer: PRIVATE HEALTH INSURANCE | Admitting: Lab

## 2012-10-08 ENCOUNTER — Encounter: Payer: Self-pay | Admitting: Gastroenterology

## 2012-10-08 ENCOUNTER — Ambulatory Visit (INDEPENDENT_AMBULATORY_CARE_PROVIDER_SITE_OTHER): Payer: PRIVATE HEALTH INSURANCE | Admitting: Gastroenterology

## 2012-10-08 VITALS — BP 136/62 | HR 76

## 2012-10-08 DIAGNOSIS — K31819 Angiodysplasia of stomach and duodenum without bleeding: Secondary | ICD-10-CM

## 2012-10-08 NOTE — Patient Instructions (Addendum)
Continue the iron supplements. Continue the Protonix. Have your hemoglobin monitored by your family doctor.  We will put a recall colonoscopy in for December 2015.

## 2012-10-08 NOTE — Progress Notes (Signed)
10/08/2012 [name] [id number] Oct 31, 1945   HISTORY OF PRESENT ILLNESS:  Patient is a 67 year old female who is a new patient to our practice.  She was seeing Dr. Elnoria Howard previously but missed an appointment and he would no longer see her as a patient.  She was initially seen by him in 12/2011 when she was admitted to the hospital with significant anemia (Hgb in 4 gram range).  EGD revealed GAVE.  She underwent APC of that and had a few other subsequent EGD's with APC as well.  The last EGD was in March of this year.  She has been on iron supplements ferrous sulfate 325 mg three times daily as well as daily protonix.  Sine her last EGD  Her Hgb has been stable and was 14.2 grams just two weeks ago.  She says that her stools are black, but this is likely from the iron.  She was hemoccult negative one month ago.  Admits to general abdominal discomfort, but says that she was told that she had IBS.  She had a colonoscopy in 12/2003 by Dr. Laural Benes, which was normal at that time.  She has a history of DVT's and PE and was previously on coumadin.  Saw hematology who put her on lovenox injections instead.  She needs lifelong anti-coagulation.   Past Medical History  Diagnosis Date  . Chronic obstructive pulmonary disease   . Depression     followed by Dr. Gwyndolyn Kaufman, [facility]  . Gastroesophageal reflux disease   . Hypertension   . Hyperlipidemia   . Interstitial cystitis     followed by Dr. Isabel Caprice  . Irritable bowel syndrome     Colonoscopy 12/2003 and EGD 1997 WNL  . Spinal stenosis     L3-L4, L4-L5 per MRI 12/2004, s/p decompression 02/2007 by Dr. Gerlene Fee, residual chronic low back pain  . 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 Obstructive sleep apnea     Noncompliant with CPAP "ineffective"  . Anxiety   . 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 Elnoria Howard  . History of blood transfusion     during GAVE associated GI bleed  . Cataract     Left eye pending  surgery  . Clotting disorder 2009    LLE DVT and PE (1 month apart)  . Fibromyalgia   . Incontinent of urine     at night  . Complication of anesthesia     Hard time putting patient to sleep  . Insomnia   . Shortness of breath   . Edema leg     left  . Family history of anesthesia complication     "Mama couldn't wake up easy" (06/24/2012)  . CHF (congestive heart failure)   . DVT (deep venous thrombosis)     "left" (06/24/2012)  . Pneumonia     "double once year" (06/24/2012)  . Chronic bronchitis     "I forever have bronchitis" (06/24/2012)  . Anemia   . Daily headache     "just about" (06/24/2012)  . Bell's palsy 1980's  . Arthritis     "I'm eat up w/it" (06/24/2012)  . Chronic lower back pain   . 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 Breast cancer     Bilateral s/p mastectomies  . On home oxygen therapy     "2L prn" (06/24/2012)   Past Surgical History  Procedure Laterality Date  . Cervical  discectomy  11/1995 and 05/2002    by Dr. Gerlene Fee  . Abdominal hysterectomy  1976  . Bladder suspension  1993  . Orif ankle fracture  1993  . Lumbar laminectomy/decompression microdiscectomy  02/2007    by Dr. Gerlene Fee  . Carpal tunnel release Left   . Tonsillectomy and adenoidectomy  1953  . Appendectomy    . Dilation and curettage of uterus    . Tubal ligation    . Carpectomy  07/23/2011    Procedure: CARPECTOMY;  Surgeon: Sharma Covert, MD;  Location: [facility] OR;  Service: Orthopedics;  Laterality: Left;  LEFT WRIST PROXIMAL ROW CARPECTOMY  . Esophagogastroduodenoscopy  12/25/2011    Procedure: ESOPHAGOGASTRODUODENOSCOPY (EGD);  Surgeon: Theda Belfast, MD;  Location: [facility] ENDOSCOPY;  Service: Endoscopy;  Laterality: N/A;  . Breast surgery  1992    Bilateral mastectomies for breast cancer  . Rhinoplasty  1993  . Spine surgery    . Hot hemostasis  02/06/2012    Procedure: HOT HEMOSTASIS (ARGON PLASMA COAGULATION/BICAP);  Surgeon: Theda Belfast, MD;  Location: [facility]OSCOPY;   Service: Endoscopy;  Laterality: N/A;  . Esophagogastroduodenoscopy  02/06/2012    Procedure: ESOPHAGOGASTRODUODENOSCOPY (EGD);  Surgeon: Theda Belfast, MD;  Location: [facility] ENDOSCOPY;  Service: Endoscopy;  Laterality: N/A;  . Esophagogastroduodenoscopy N/A 04/02/2012    Procedure: ESOPHAGOGASTRODUODENOSCOPY (EGD);  Surgeon: Theda Belfast, MD;  Location: [facility] ENDOSCOPY;  Service: Endoscopy;  Laterality: N/A;  EGD w/ APC  . Back surgery      x 3  . Tendon transfer Left 06/24/2012    "thumb" (06/24/2012)  . Mastectomy Bilateral   . Melanoma excision Left     "side of my nose" (06/24/2012)  . Tendon transfer Left 06/24/2012    Procedure: LEFT THUMB EIP TO EPL TENDON TRANSFER;  Surgeon: Sharma Covert, MD;  Location: [facility] OR;  Service: Orthopedics;  Laterality: Left;    reports that she has been smoking Cigarettes.  She has a 40 pack-year smoking history. She has never used smokeless tobacco. She reports that she does not drink alcohol or use illicit drugs. family history includes Alcohol abuse in her father; Anxiety disorder in her son; Cancer - Lung in her brother; Deep vein thrombosis in her mother; Healthy in her brother and brother; Heart disease in her mother; Schizophrenia in her father. Allergies  Allergen Reactions  . Adhesive [Tape] Other (See Comments)    "I break out all over"; Can only use paper tape  . Codeine Other (See Comments)    "first dose I took, my aide found me the next day laying on the floor"  . Morphine Other (See Comments)    caused change in mental status, she does not want to try it again.  . Sulfonamide Derivatives Nausea And Vomiting  . Thorazine [Chlorpromazine] Nausea Only      Outpatient Encounter Prescriptions as of 10/08/2012  Medication Sig Dispense Refill  . cephALEXin (KEFLEX) 500 MG capsule Take 1 capsule (500 mg total) by mouth 4 (four) times daily.  28 capsule  0  . docusate sodium (COLACE) 100 MG capsule Take 1 capsule (100 mg total) by mouth 2 (two) times  daily.  30 capsule  0  . enoxaparin (LOVENOX) 100 MG/ML injection Inject into the skin 2 (two) times daily.       . ferrous sulfate 325 (65 FE) MG EC tablet Take 325 mg by mouth 3 (three) times daily with meals.      . gabapentin (NEURONTIN) 100  MG capsule Take 100 mg by mouth 3 (three) times daily.       Marland Kitchen lamoTRIgine (LAMICTAL) 200 MG tablet Take 200 mg by mouth every morning.      Marland Kitchen LORazepam (ATIVAN) 1 MG tablet Take 1-2 mg by mouth 2 (two) times daily. Takes 1 tablet in morning and 2 tablets at bedtime      . mupirocin ointment (BACTROBAN) 2 %       . oxyCODONE-acetaminophen (PERCOCET/ROXICET) 5-325 MG per tablet Take 1 tablet by mouth every 6 (six) hours as needed for pain.      . pantoprazole (PROTONIX) 40 MG tablet Take 40 mg by mouth daily.      . pravastatin (PRAVACHOL) 40 MG tablet Take 1 tablet (40 mg total) by mouth every morning.  90 tablet  3  . QUEtiapine (SEROQUEL) 200 MG tablet Take 400 mg by mouth at bedtime.       Marland Kitchen QUEtiapine (SEROQUEL) 400 MG tablet       . sertraline (ZOLOFT) 100 MG tablet Take 200 mg by mouth every morning.       . vitamin C (ASCORBIC ACID) 500 MG tablet Take 1 tablet (500 mg total) by mouth daily.  90 tablet  0   No facility-administered encounter medications on file as of 10/08/2012.     REVIEW OF SYSTEMS  : All other systems reviewed and negative except where noted in the History of Present Illness.   PHYSICAL EXAM: BP 136/62  Pulse 76 General: Well developed white female in no acute distress; in wheelchair Head: Normocephalic and atraumatic Eyes:  Sclerae anicteric,conjunctive pink. Ears: Normal auditory acuity  Lungs: Clear throughout to auscultation but decreased breath sounds noted. Heart: Regular rate and rhythm Abdomen: Soft, obese, non-distended.  Normal bowel sounds.  Ecchymoses all over abdomen. Musculoskeletal: Symmetrical with no gross deformities  Skin: No lesions on visible extremities Extremities: No edema  Neurological:  Alert oriented x 4, grossly non-focal. Psychological:  Alert and cooperative. Normal mood and affect  ASSESSMENT AND PLAN: -GAVE:  Required blood transfusions and several EGD's with APC.  Hgb has been normal and stable for the past 6 months since her last EGD.  She was recently heme negative.  She is on iron supplements and should continue those along with her daily protonix.  No need for repeat EGD unless sign of decreasing Hgb.  Should have monthly monitoring of Hgb by PCP.   -Screening colonoscopy:  Due in 12/2013.  Recall entered.

## 2012-10-08 NOTE — Progress Notes (Signed)
Agree 

## 2012-10-13 ENCOUNTER — Encounter: Payer: Self-pay | Admitting: Internal Medicine

## 2012-10-13 ENCOUNTER — Ambulatory Visit: Payer: PRIVATE HEALTH INSURANCE | Admitting: Oncology

## 2012-10-13 ENCOUNTER — Other Ambulatory Visit: Payer: PRIVATE HEALTH INSURANCE | Admitting: Lab

## 2012-10-19 ENCOUNTER — Other Ambulatory Visit: Payer: Self-pay | Admitting: Hematology and Oncology

## 2012-10-19 ENCOUNTER — Other Ambulatory Visit (HOSPITAL_BASED_OUTPATIENT_CLINIC_OR_DEPARTMENT_OTHER): Payer: PRIVATE HEALTH INSURANCE | Admitting: Lab

## 2012-10-19 ENCOUNTER — Encounter: Payer: Self-pay | Admitting: Hematology and Oncology

## 2012-10-19 ENCOUNTER — Telehealth: Payer: Self-pay | Admitting: Hematology and Oncology

## 2012-10-19 ENCOUNTER — Ambulatory Visit (HOSPITAL_BASED_OUTPATIENT_CLINIC_OR_DEPARTMENT_OTHER): Payer: PRIVATE HEALTH INSURANCE | Admitting: Hematology and Oncology

## 2012-10-19 VITALS — BP 130/65 | HR 72 | Temp 98.0°F | Resp 18

## 2012-10-19 DIAGNOSIS — I82409 Acute embolism and thrombosis of unspecified deep veins of unspecified lower extremity: Secondary | ICD-10-CM

## 2012-10-19 DIAGNOSIS — Z86711 Personal history of pulmonary embolism: Secondary | ICD-10-CM

## 2012-10-19 DIAGNOSIS — I82402 Acute embolism and thrombosis of unspecified deep veins of left lower extremity: Secondary | ICD-10-CM

## 2012-10-19 DIAGNOSIS — K31819 Angiodysplasia of stomach and duodenum without bleeding: Secondary | ICD-10-CM

## 2012-10-19 DIAGNOSIS — F17200 Nicotine dependence, unspecified, uncomplicated: Secondary | ICD-10-CM

## 2012-10-19 DIAGNOSIS — D509 Iron deficiency anemia, unspecified: Secondary | ICD-10-CM

## 2012-10-19 LAB — COMPREHENSIVE METABOLIC PANEL (CC13)
ALT: 7 U/L (ref 0–55)
AST: 11 U/L (ref 5–34)
Albumin: 3.3 g/dL — ABNORMAL LOW (ref 3.5–5.0)
Alkaline Phosphatase: 107 U/L (ref 40–150)
Calcium: 8.9 mg/dL (ref 8.4–10.4)
Chloride: 108 mEq/L (ref 98–109)
Potassium: 3.4 mEq/L — ABNORMAL LOW (ref 3.5–5.1)

## 2012-10-19 LAB — CBC WITH DIFFERENTIAL/PLATELET
BASO%: 0.7 % (ref 0.0–2.0)
EOS%: 2.5 % (ref 0.0–7.0)
HGB: 14.1 g/dL (ref 11.6–15.9)
MCH: 29 pg (ref 25.1–34.0)
MCHC: 32.3 g/dL (ref 31.5–36.0)
MCV: 89.6 fL (ref 79.5–101.0)
MONO%: 5.6 % (ref 0.0–14.0)
RBC: 4.85 10*6/uL (ref 3.70–5.45)
RDW: 15.7 % — ABNORMAL HIGH (ref 11.2–14.5)
lymph#: 1.3 10*3/uL (ref 0.9–3.3)

## 2012-10-19 LAB — FERRITIN CHCC: Ferritin: 36 ng/ml (ref 9–269)

## 2012-10-19 MED ORDER — ENOXAPARIN SODIUM 100 MG/ML ~~LOC~~ SOLN: 100.0000 mg | Freq: Two times a day (BID) | SUBCUTANEOUS | Status: DC

## 2012-10-19 NOTE — Telephone Encounter (Signed)
Gave pt appt for MD for January 2014

## 2012-10-19 NOTE — Progress Notes (Signed)
[facility] OFFICE PROGRESS NOTE  Rocco Serene, MD DIAGNOSIS: Extensive LLE DVT, history of PE and history of GI bleed   SUMMARY OF HEMATOLOGIC HISTORY: [name] had a remote history of right lung PE and left lower extremity DVT. She also has a history of chronic UGI bleed from what sounds like AVM.  She requires monthly EGD and treatment by Dr. Elnoria Howard and currently on Lovenox for DVT and iron replacement therapy for history of GI bleed  INTERVAL HISTORY: [name] 67 y.o. female returns for followup regarding her history of microcytic anemia and recurrent DVT. According to the patient, she did have missed several appointments with the GI doctor. She currently follow with her primary care provider every other month with regular blood work. She denies any recent bleeding such as epistaxis, hematuria, or hematochezia. She does have significant bruising in her abdomen from Lovenox. However, according to her, she tolerated Lovenox better than warfarin. She's able to tolerate oral iron supplements daily. The patient is currently attempting to quit smoking  I have reviewed the past medical history, past surgical history, social history and family history with the patient and they are unchanged from previous note.  ALLERGIES:  is allergic to adhesive; codeine; morphine; sulfonamide derivatives; and thorazine.  MEDICATIONS: Current outpatient prescriptions:docusate sodium (COLACE) 100 MG capsule, Take 1 capsule (100 mg total) by mouth 2 (two) times daily., Disp: 30 capsule, Rfl: 0;  enoxaparin (LOVENOX) 100 MG/ML injection, Inject 1 mL (100 mg total) into the skin 2 (two) times daily., Disp: 60 Syringe, Rfl: 4;  ferrous sulfate 325 (65 FE) MG EC tablet, Take 325 mg by mouth 3 (three) times daily with meals., Disp: , Rfl:  gabapentin (NEURONTIN) 100 MG capsule, Take 100 mg by mouth 3 (three) times daily. , Disp: , Rfl: ;  HYDROcodone-acetaminophen (NORCO) 10-325 MG per tablet,  , Disp: , Rfl: ;  lamoTRIgine (LAMICTAL) 200 MG tablet, Take 200 mg by mouth every morning., Disp: , Rfl: ;  LORazepam (ATIVAN) 1 MG tablet, Take 1-2 mg by mouth 2 (two) times daily. Takes 1 tablet in morning and 2 tablets at bedtime, Disp: , Rfl:  mupirocin ointment (BACTROBAN) 2 %, , Disp: , Rfl: ;  oxyCODONE-acetaminophen (PERCOCET/ROXICET) 5-325 MG per tablet, Take 1 tablet by mouth every 6 (six) hours as needed for pain., Disp: , Rfl: ;  pantoprazole (PROTONIX) 40 MG tablet, Take 40 mg by mouth daily., Disp: , Rfl: ;  pravastatin (PRAVACHOL) 40 MG tablet, Take 1 tablet (40 mg total) by mouth every morning., Disp: 90 tablet, Rfl: 3 QUEtiapine (SEROQUEL) 200 MG tablet, Take 400 mg by mouth at bedtime. , Disp: , Rfl: ;  sertraline (ZOLOFT) 100 MG tablet, Take 200 mg by mouth every morning. , Disp: , Rfl: ;  vitamin C (ASCORBIC ACID) 500 MG tablet, Take 1 tablet (500 mg total) by mouth daily., Disp: 90 tablet, Rfl: 0;  cephALEXin (KEFLEX) 500 MG capsule, Take 1 capsule (500 mg total) by mouth 4 (four) times daily., Disp: 28 capsule, Rfl: 0 traZODone (DESYREL) 100 MG tablet, , Disp: , Rfl:   REVIEW OF SYSTEMS:   Constitutional: Denies fevers, chills or abnormal weight loss Eyes: Denies blurriness of vision Ears, nose, mouth, throat, and face: Denies mucositis or sore throat Respiratory: Denies cough, dyspnea or wheezes Cardiovascular: Denies palpitation, chest discomfort or lower extremity swelling Gastrointestinal:  Denies nausea, heartburn or change in bowel habits Skin: Denies abnormal skin rashes Lymphatics: Denies new lymphadenopathy or  easy bruising Neurological:Denies numbness, tingling or new weaknesses Behavioral/Psych: Mood is stable, no new changes  All other systems were reviewed with the patient and are negative.  PHYSICAL EXAMINATION: ECOG PERFORMANCE STATUS: 1 - Symptomatic but completely ambulatory  Filed Vitals:   10/19/12 1402  BP: 130/65  Pulse: 72  Temp: 98 F (36.7 C)   Resp: 18   There were no vitals filed for this visit.   GENERAL:alert, no distress and comfortable patient is morbidly obese SKIN: skin color, texture, turgor are normal, no rashes or significant lesions noted bruising on her abdomen from Lovenox injection EYES: normal, Conjunctiva are pink and non-injected, sclera clear OROPHARYNX:no exudate, no erythema and lips, buccal mucosa, and tongue normal  NECK: supple, thyroid normal size, non-tender, without nodularity LYMPH:  no palpable lymphadenopathy in the cervical, axillary or inguinal LUNGS: clear to auscultation and percussion with normal breathing effort HEART: regular rate & rhythm and no murmurs and no lower extremity edema ABDOMEN:abdomen soft, non-tender and normal bowel sounds Musculoskeletal:no cyanosis of digits and no clubbing  NEURO: alert & oriented x 3 with fluent speech, no focal motor/sensory deficits  LABORATORY DATA:  I have reviewed the data as listed Results for orders placed in visit on 10/19/12 (from the past 48 hour(s))  FERR[facility]IN [facility]     Status: None   Collection Time    10/19/12 12:48 PM      Result Value Range   Ferritin 36  9 - 269 ng/ml  CBC WITH DIFFERENTIAL     Status: Abnormal   Collection Time    10/19/12 12:48 PM      Result Value Range   WBC 5.5  3.9 - 10.3 10e3/uL   NEUT# 3.7  1.5 - 6.5 10e3/uL   HGB 14.1  11.6 - 15.9 g/dL   HCT 91.4  78.2 - 95.6 %   Platelets 237  145 - 400 10e3/uL   MCV 89.6  79.5 - 101.0 fL   MCH 29.0  25.1 - 34.0 pg   MCHC 32.3  31.5 - 36.0 g/dL   RBC 2.13  0.86 - 5.78 10e6/uL   RDW 15.7 (*) 11.2 - 14.5 %   lymph# 1.3  0.9 - 3.3 10e3/uL   MONO# 0.3  0.1 - 0.9 10e3/uL   Eosinophils Absolute 0.1  0.0 - 0.5 10e3/uL   Basophils Absolute 0.0  0.0 - 0.1 10e3/uL   NEUT% 67.6  38.4 - 76.8 %   LYMPH% 23.6  14.0 - 49.7 %   MONO% 5.6  0.0 - 14.0 %   EOS% 2.5  0.0 - 7.0 %   BASO% 0.7  0.0 - 2.0 %  COMPREHENSIVE METABOLIC PANEL (CC13)     Status: Abnormal   Collection Time     10/19/12 12:48 PM      Result Value Range   Sodium 144  136 - 145 mEq/L   Potassium 3.4 (*) 3.5 - 5.1 mEq/L   Chloride 108  98 - 109 mEq/L   CO2 27  22 - 29 mEq/L   Glucose 117  70 - 140 mg/dl   BUN 7.3  7.0 - 46.9 mg/dL   Creatinine 0.7  0.6 - 1.1 mg/dL   Total Bilirubin 6.29  0.20 - 1.20 mg/dL   Alkaline Phosphatase 107  40 - 150 U/L   AST 11  5 - 34 U/L   ALT 7  0 - 55 U/L   Total Protein 6.4  6.4 - 8.3 g/dL   Albumin 3.3 (*)  3.5 - 5.0 g/dL   Calcium 8.9  8.4 - 08.6 mg/dL     ASSESSMENT: Recurrent DVT, on lifelong anticoagulation therapy, with recent GI bleed   PLAN:  #1 recurrent DVT I discussed with the patient importance of nicotine cessation. She is attempting to quit on her own. She remains on Lovenox injection for life for now. Lovenox is probably safer for her due to history of severe GI bleed. #2 microcytic anemia With iron replacement therapy, anemia has resolved. However her iron studies suggested she still a little bit iron deficient. I recommended she continue oral iron replacement therapy for the next few months. #3 smoking Again I spent some time educating the patient importance of stopping smoking. All questions were answered. The patient knows to call the clinic with any problems, questions or concerns. We can certainly see the patient much sooner if necessary. No barriers to learning was detected.  I spent 15 minutes counseling the patient face to face. The total time spent in the appointment was 20 minutes and more than 50% was on counseling.     [facility], Manisha Cancel, MD 10/19/2012 2:21 PM

## 2012-11-12 ENCOUNTER — Encounter: Payer: PRIVATE HEALTH INSURANCE | Admitting: Internal Medicine

## 2012-11-19 ENCOUNTER — Encounter: Payer: Self-pay | Admitting: Internal Medicine

## 2012-11-19 ENCOUNTER — Ambulatory Visit (INDEPENDENT_AMBULATORY_CARE_PROVIDER_SITE_OTHER): Payer: PRIVATE HEALTH INSURANCE | Admitting: Internal Medicine

## 2012-11-19 ENCOUNTER — Ambulatory Visit (HOSPITAL_COMMUNITY)
Admission: RE | Admit: 2012-11-19 | Discharge: 2012-11-19 | Disposition: A | Discharge status: Home / Self Care | Payer: PRIVATE HEALTH INSURANCE | Source: Ambulatory Visit | Attending: Internal Medicine | Admitting: Internal Medicine

## 2012-11-19 VITALS — BP 127/69 | HR 76 | Temp 97.4°F | Wt 284.7 lb

## 2012-11-19 DIAGNOSIS — Z720 Tobacco use: Secondary | ICD-10-CM

## 2012-11-19 DIAGNOSIS — Z981 Arthrodesis status: Secondary | ICD-10-CM | POA: Insufficient documentation

## 2012-11-19 DIAGNOSIS — I509 Heart failure, unspecified: Secondary | ICD-10-CM | POA: Insufficient documentation

## 2012-11-19 DIAGNOSIS — F17200 Nicotine dependence, unspecified, uncomplicated: Secondary | ICD-10-CM

## 2012-11-19 DIAGNOSIS — E785 Hyperlipidemia, unspecified: Secondary | ICD-10-CM

## 2012-11-19 DIAGNOSIS — R918 Other nonspecific abnormal finding of lung field: Secondary | ICD-10-CM

## 2012-11-19 DIAGNOSIS — J811 Chronic pulmonary edema: Secondary | ICD-10-CM | POA: Insufficient documentation

## 2012-11-19 DIAGNOSIS — I82402 Acute embolism and thrombosis of unspecified deep veins of left lower extremity: Secondary | ICD-10-CM

## 2012-11-19 DIAGNOSIS — J209 Acute bronchitis, unspecified: Secondary | ICD-10-CM

## 2012-11-19 DIAGNOSIS — N3941 Urge incontinence: Secondary | ICD-10-CM

## 2012-11-19 DIAGNOSIS — F329 Major depressive disorder, single episode, unspecified: Secondary | ICD-10-CM

## 2012-11-19 DIAGNOSIS — D509 Iron deficiency anemia, unspecified: Secondary | ICD-10-CM

## 2012-11-19 DIAGNOSIS — I82409 Acute embolism and thrombosis of unspecified deep veins of unspecified lower extremity: Secondary | ICD-10-CM

## 2012-11-19 DIAGNOSIS — F411 Generalized anxiety disorder: Secondary | ICD-10-CM

## 2012-11-19 DIAGNOSIS — Z0000 Encounter for general adult medical examination without abnormal findings: Secondary | ICD-10-CM

## 2012-11-19 DIAGNOSIS — F419 Anxiety disorder, unspecified: Secondary | ICD-10-CM

## 2012-11-19 LAB — CBC WITH DIFFERENTIAL/PLATELET
Basophils Absolute: 0 10*3/uL (ref 0.0–0.1)
Basophils Relative: 1 % (ref 0–1)
Eosinophils Absolute: 0.1 10*3/uL (ref 0.0–0.7)
HCT: 46.3 % — ABNORMAL HIGH (ref 36.0–46.0)
Hemoglobin: 15 g/dL (ref 12.0–15.0)
MCH: 29.8 pg (ref 26.0–34.0)
MCHC: 32.4 g/dL (ref 30.0–36.0)
Monocytes Absolute: 0.5 10*3/uL (ref 0.1–1.0)
Monocytes Relative: 8 % (ref 3–12)
Neutro Abs: 3.6 10*3/uL (ref 1.7–7.7)
Neutrophils Relative %: 64 % (ref 43–77)
RDW: 15.1 % (ref 11.5–15.5)

## 2012-11-19 LAB — LIPID PANEL
LDL Cholesterol: 155 mg/dL — ABNORMAL HIGH (ref 0–99)
Triglycerides: 200 mg/dL — ABNORMAL HIGH (ref <150)
VLDL: 40 mg/dL (ref 0–40)

## 2012-11-19 MED ORDER — DOXYCYCLINE HYCLATE 100 MG PO TABS: 100.0000 mg | ORAL_TABLET | Freq: Two times a day (BID) | ORAL | Status: DC

## 2012-11-19 NOTE — Patient Instructions (Addendum)
It is good to see you again.  I am sorry you are not feeling well.  1) Keep taking your medications as you are.  2) Keep working on trying to quit smoking.  3) Keep using the Kegel exercises for the urination.  4) We checked a cholesterol and a blood count today.  I will call you with the results next week if they are abnormal.  5) We checked a CXR on you today.  We diagnosed an acute bronchitis and are treating you with doxycycline 100 mg by mouth twice daily for 7 days.  The prescription was sent to your pharmacy.  I will see you back in 3 months, sooner if necessary.  Please call if you are not feeling better by early next week.

## 2012-11-21 ENCOUNTER — Encounter: Payer: Self-pay | Admitting: Internal Medicine

## 2012-11-21 DIAGNOSIS — J209 Acute bronchitis, unspecified: Secondary | ICD-10-CM | POA: Insufficient documentation

## 2012-11-21 MED ORDER — PRAVASTATIN SODIUM 80 MG PO TABS: 80.0000 mg | ORAL_TABLET | Freq: Every day | ORAL | Status: DC

## 2012-11-21 NOTE — Assessment & Plan Note (Signed)
She has noted no improvement despite the Kegel exercises.  She was encouraged to continue working on the Kegel exercises.

## 2012-11-21 NOTE — Assessment & Plan Note (Signed)
Secondary to GAVE.  Will continue the iron replacement for now until the GAVE has been addressed by GI.

## 2012-11-21 NOTE — Assessment & Plan Note (Signed)
Ms. Droz notes a 2 day history of a cough productive of white sputum.  There are no fevers, shakes, chills, or sick contacts. She has pleuritic chest pain on the left anterior chest wall which is worse with coughing.  It is also reproducible with pressing on the chest wall and likely is musculoskeletal from the coughing.  CXR reveals a very mild interstitial infiltrate w/o effusions, masses, or areas of consolidation.  I believe she has an acute bronchitis and will treat with doxycycline 100 mg PO BID X 7 days.  She is to call the clinic next week if she is not improved.  At that point I may consider an atypical pneumonia VS a yet undocumented diastolic dysfunction.  An atypical pneumonia should also respond to the doxycycline.

## 2012-11-21 NOTE — Assessment & Plan Note (Signed)
She states the sertraline is effective and we will continue at 200 mg daily.

## 2012-11-21 NOTE — Progress Notes (Signed)
  Subjective:    Patient ID: [name], female    DOB: 03-Mar-1945, 67 y.o.   MRN: [id number]  HPI  Please see the A&P for the status of the pt's chronic medical problems.  Review of Systems  Constitutional: Positive for fatigue. Negative for fever, chills, diaphoresis, activity change and unexpected weight change.  HENT: Negative for congestion, postnasal drip, rhinorrhea, sinus pressure, sneezing and sore throat.   Eyes: Negative for discharge, redness and itching.  Respiratory: Positive for cough, shortness of breath and wheezing. Negative for choking and stridor.   Cardiovascular: Positive for chest pain and leg swelling. Negative for palpitations.  Gastrointestinal: Negative for abdominal pain.  Genitourinary: Positive for urgency.  Neurological: Negative for headaches.  Psychiatric/Behavioral: Positive for sleep disturbance.      Objective:   Physical Exam  Nursing note and vitals reviewed. Constitutional: She is oriented to person, place, and time. She appears well-developed and well-nourished. No distress.  Appears tired  HENT:  Head: Normocephalic and atraumatic.  Eyes: Conjunctivae are normal. Right eye exhibits no discharge. Left eye exhibits no discharge. No scleral icterus.  Neck: Neck supple.  Cardiovascular: Normal rate, regular rhythm and normal heart sounds.  Exam reveals no gallop and no friction rub.   No murmur heard. Pulmonary/Chest: Effort normal. No stridor. No respiratory distress. She has wheezes. She has no rales. She exhibits tenderness.  Bronchial breath sounds on the left anteriorly.  Abdominal: Soft. She exhibits no distension.  Musculoskeletal: Normal range of motion. She exhibits edema. She exhibits no tenderness.  Mild left lower extremity weakness.  Neurological: She is alert and oriented to person, place, and time. She exhibits normal muscle tone.  Skin: Skin is warm and dry. No rash noted. She is not diaphoretic. No erythema. No pallor.   Psychiatric: She has a normal mood and affect. Her behavior is normal. Judgment and thought content normal.      Assessment & Plan:   Please see problem oriented charting.  Total cholesterol 234 Triglycerides 200 HDL 39 LDL 155  CBC with diff unremarkable  CXR: Slight interstitial prominence w/o consolidation, effusions, or masses.

## 2012-11-21 NOTE — Assessment & Plan Note (Signed)
LDL was 155 which is above the goal of less than 130.  Will increase the dose to 80 mg PO daily and reassess the LDL at the follow-up visit.

## 2012-11-21 NOTE — Assessment & Plan Note (Signed)
She is continuing to try to cut back.  At the follow-up visit we will again educate on the importance of smoking cessation, especially in light of her recent bronchitis and again offer medications to assist in her cessation efforts.

## 2012-11-21 NOTE — Assessment & Plan Note (Signed)
As she is not feeling well today secondary to the acute bronchitis we will defer the Tdap and zostavax to the follow-up visit.

## 2012-11-21 NOTE — Assessment & Plan Note (Signed)
Apparently she has been converted to long term enoxaparin as she was unable to achieve a therapeutic INR with coumadin despite monitoring in the [place] anticoagulation clinic.  Will continue the enoxaparin with anticoagulation clinic follow-up.

## 2012-11-21 NOTE — Assessment & Plan Note (Signed)
Well controlled on the sertraline 200 mg daily and the as needed lorazepam.  We will continue with this regimen.

## 2012-11-29 ENCOUNTER — Other Ambulatory Visit: Payer: Self-pay | Admitting: Internal Medicine

## 2012-11-29 DIAGNOSIS — M810 Age-related osteoporosis without current pathological fracture: Secondary | ICD-10-CM

## 2012-11-29 MED ORDER — CALCIUM CITRATE-VITAMIN D 315-200 MG-UNIT PO TABS: 2.0000 | ORAL_TABLET | Freq: Two times a day (BID) | ORAL | Status: DC

## 2012-11-29 MED ORDER — ALENDRONATE SODIUM 70 MG PO TABS: 70.0000 mg | ORAL_TABLET | ORAL | Status: DC

## 2012-11-29 NOTE — Progress Notes (Signed)
Osteoporosis:  6% 10 year hip fracture risk.  Calcium-Vitamin D and alendronate 70 mg each week were started.

## 2013-01-11 ENCOUNTER — Telehealth: Payer: Self-pay | Admitting: *Deleted

## 2013-01-11 NOTE — Telephone Encounter (Signed)
Still has white-yellow productive cough since last visit in 10/2012. Pt states worse at night. Sleeping on 3 pillows. Problems with transportation. Appt made 01/22/12 1:45PM Dr Andrey Campanile. If any change suggest Urgent Care of ER. [name] Anah Billard RN 01/11/13 1:45PM

## 2013-01-17 ENCOUNTER — Other Ambulatory Visit: Payer: Self-pay | Admitting: *Deleted

## 2013-01-17 MED ORDER — PANTOPRAZOLE SODIUM 40 MG PO TBEC: 40.0000 mg | DELAYED_RELEASE_TABLET | Freq: Every day | ORAL | Status: DC

## 2013-01-21 ENCOUNTER — Ambulatory Visit (INDEPENDENT_AMBULATORY_CARE_PROVIDER_SITE_OTHER): Payer: PRIVATE HEALTH INSURANCE | Admitting: Internal Medicine

## 2013-01-21 ENCOUNTER — Ambulatory Visit (HOSPITAL_COMMUNITY)
Admission: RE | Admit: 2013-01-21 | Discharge: 2013-01-21 | Disposition: A | Discharge status: Home / Self Care | Payer: PRIVATE HEALTH INSURANCE | Source: Ambulatory Visit | Attending: Internal Medicine | Admitting: Internal Medicine

## 2013-01-21 ENCOUNTER — Encounter: Payer: Self-pay | Admitting: Internal Medicine

## 2013-01-21 VITALS — BP 126/69 | HR 71 | Temp 97.3°F

## 2013-01-21 DIAGNOSIS — R053 Chronic cough: Secondary | ICD-10-CM

## 2013-01-21 DIAGNOSIS — M4120 Other idiopathic scoliosis, site unspecified: Secondary | ICD-10-CM | POA: Insufficient documentation

## 2013-01-21 DIAGNOSIS — R059 Cough, unspecified: Secondary | ICD-10-CM

## 2013-01-21 DIAGNOSIS — Z87891 Personal history of nicotine dependence: Secondary | ICD-10-CM | POA: Insufficient documentation

## 2013-01-21 DIAGNOSIS — M47814 Spondylosis without myelopathy or radiculopathy, thoracic region: Secondary | ICD-10-CM | POA: Insufficient documentation

## 2013-01-21 DIAGNOSIS — R05 Cough: Secondary | ICD-10-CM

## 2013-01-21 LAB — CBC WITH DIFFERENTIAL/PLATELET
BASOS ABS: 0 10*3/uL (ref 0.0–0.1)
BASOS PCT: 0 % (ref 0–1)
EOS ABS: 0.1 10*3/uL (ref 0.0–0.7)
EOS PCT: 2 % (ref 0–5)
HCT: 45.9 % (ref 36.0–46.0)
Hemoglobin: 14.6 g/dL (ref 12.0–15.0)
Lymphocytes Relative: 23 % (ref 12–46)
Lymphs Abs: 1.6 10*3/uL (ref 0.7–4.0)
MCH: 29.6 pg (ref 26.0–34.0)
MCHC: 31.8 g/dL (ref 30.0–36.0)
MCV: 93.1 fL (ref 78.0–100.0)
Monocytes Absolute: 0.5 10*3/uL (ref 0.1–1.0)
Monocytes Relative: 8 % (ref 3–12)
Neutro Abs: 4.6 10*3/uL (ref 1.7–7.7)
Neutrophils Relative %: 67 % (ref 43–77)
PLATELETS: 252 10*3/uL (ref 150–400)
RBC: 4.93 MIL/uL (ref 3.87–5.11)
RDW: 15.9 % — AB (ref 11.5–15.5)
WBC: 6.9 10*3/uL (ref 4.0–10.5)

## 2013-01-21 LAB — BASIC METABOLIC PANEL WITH GFR
BUN: 8 mg/dL (ref 6–23)
CALCIUM: 9.2 mg/dL (ref 8.4–10.5)
CO2: 34 mEq/L — ABNORMAL HIGH (ref 19–32)
CREATININE: 0.66 mg/dL (ref 0.50–1.10)
Chloride: 103 mEq/L (ref 96–112)
GFR, Est African American: >89 mL/min
Glucose, Bld: 101 mg/dL — ABNORMAL HIGH (ref 70–99)
Potassium: 3 mEq/L — ABNORMAL LOW (ref 3.5–5.3)
Sodium: 146 mEq/L — ABNORMAL HIGH (ref 135–145)

## 2013-01-21 MED ORDER — GUAIFENESIN ER 600 MG PO TB12: 600.0000 mg | ORAL_TABLET | Freq: Two times a day (BID) | ORAL | Status: DC | PRN

## 2013-01-21 NOTE — Patient Instructions (Signed)
1. You have hospital follow up appointments as follows:   2. Please take all medications as prescribed.  3. If you have worsening of your symptoms or new symptoms arise, please call the clinic ([phone]), or go to the ER immediately if symptoms are severe.     

## 2013-01-21 NOTE — Progress Notes (Signed)
   Subjective:    Patient ID: [name], female    DOB: [birth year], 68 y.o.   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  The cough was initially productive of white sputum.  She was evaluated when it started two months ago and treated for acute bronchitis with doxycycline.  She was instructed to call the clinic the following week if she had not improved.  She says she initially had some improvement but feels at if the cough never went away.  The cough is worse at night when she lays down.  It is now productive of yellow sputum but she denies hemoptysis.  She also notes congestion and sometimes feels chills.  She denies sore throat, rhinorrhea, fever, reflux symptoms, post-nasal drip, seasonal allergies or history of asthma.  She has experienced some chest discomfort associated with coughing but denies CP today.  She uses 2L home oxygen when needed and says she has started using it at night but she denies PND or orthopnea.  She says dyspnea is not the problem but the cough is what is bothering her.  She says she has been told she has COPD but she is on no bronchodilators and her PFTs reveal a restrictive process.  She is not on an ACE inhibitor.     Review of Systems  Constitutional: Positive for chills and appetite change. Negative for fever.  HENT: Positive for congestion. Negative for postnasal drip, rhinorrhea and sore throat.   Respiratory: Positive for cough. Negative for shortness of breath.   Cardiovascular: Negative for chest pain and leg swelling.  Gastrointestinal: Negative for nausea, vomiting and diarrhea.       Objective:   Physical Exam  Vitals reviewed. Constitutional: She is oriented to person, place, and time. No distress.  HENT:  Head:  Normocephalic and atraumatic.  Mouth/Throat: No oropharyngeal exudate.  Erythematous pharynx  Eyes: Pupils are equal, round, and reactive to light. No scleral icterus.  Neck: Neck supple. No JVD present.  Cardiovascular: Normal rate, regular rhythm and normal heart sounds.   Pulmonary/Chest: Effort normal. No respiratory distress. She has wheezes. She has no rales.  Abdominal: Soft. Bowel sounds are normal. She exhibits no distension. There is no tenderness.  Musculoskeletal: She exhibits no edema and no tenderness.  Neurological: She is alert and oriented to person, place, and time.  Skin: Skin is warm. She is not diaphoretic.  Psychiatric: She has a normal mood and affect. Her behavior is normal.          Assessment & Plan:  Please see problem based assessment and plan.

## 2013-01-23 DIAGNOSIS — R05 Cough: Secondary | ICD-10-CM | POA: Insufficient documentation

## 2013-01-23 DIAGNOSIS — R053 Chronic cough: Secondary | ICD-10-CM | POA: Insufficient documentation

## 2013-01-23 NOTE — Assessment & Plan Note (Addendum)
Assessment:  [name] was evaluated 2 months ago at symptom onset and her clinical picture was consistent with acute bronchitis for which she was treated.  She was asked to call the clinic in 1 week if she had no improvement.  Her symptoms have persisted for 2 months and she is wheezing.  Her CXR today shows increased lung marking and questionable nodularity.  Given these findings and her heavy smoking history CT is suggested to evaluate for malignancy.  Plan:  1) referral for Chest CT           2) guaifenesin prn for cough and congestion

## 2013-01-25 ENCOUNTER — Other Ambulatory Visit: Payer: Self-pay | Admitting: Internal Medicine

## 2013-01-25 ENCOUNTER — Telehealth: Payer: Self-pay | Admitting: Internal Medicine

## 2013-01-25 MED ORDER — POTASSIUM CHLORIDE ER 20 MEQ PO TBCR: 40.0000 meq | EXTENDED_RELEASE_TABLET | Freq: Every day | ORAL | Status: DC

## 2013-01-25 NOTE — Telephone Encounter (Signed)
I called and spoke with [name] this afternoon.  I informed her that her potassium was low and that I had sent in a prescription for 34mEq daily for 7 days.  She agrees to start taking this.

## 2013-01-26 ENCOUNTER — Telehealth: Payer: Self-pay | Admitting: Internal Medicine

## 2013-01-26 ENCOUNTER — Telehealth: Payer: Self-pay | Admitting: *Deleted

## 2013-01-26 NOTE — Telephone Encounter (Signed)
Pt called stating she was started on Kcl yesterday.  Today she took 2 pills at 1:00.  She now feels dizzy with blurred vision.  Also on mucus relief.  Pt # [id number] Pt is concern about having lung Cancer Please call her.

## 2013-01-26 NOTE — Telephone Encounter (Signed)
I returned [name]'s call.  She says she took 69mEq of KCl and took 1 Mucinex.  She says she got up to go to the bathroom and felt dizzy.  She said she felt dizzy for about 1.5 hr and no longer feels dizzy.  She also noted blurry vision while not wearing her glasses on the way back from the bathroom which also resolved.  She had no other symptoms and denies presyncope or syncope.  I advised her to not take anymore Mucinex since dizziness is a possible ADR and she just started it today.  I also advised her to call the clinic if she experiences these symptoms again so she can be seen.  I advised her to call 911 if symptoms were severe.

## 2013-01-27 NOTE — Telephone Encounter (Signed)
Dr Redmond Pulling called pt

## 2013-01-28 NOTE — Addendum Note (Signed)
Addended by: Marcelino Duster on: 01/28/2013 10:14 AM   Modules accepted: Orders

## 2013-02-01 ENCOUNTER — Encounter (HOSPITAL_COMMUNITY): Payer: Self-pay

## 2013-02-01 ENCOUNTER — Telehealth: Payer: Self-pay | Admitting: Internal Medicine

## 2013-02-01 ENCOUNTER — Ambulatory Visit (HOSPITAL_COMMUNITY)
Admission: RE | Admit: 2013-02-01 | Discharge: 2013-02-01 | Disposition: A | Discharge status: Home / Self Care | Payer: PRIVATE HEALTH INSURANCE | Source: Ambulatory Visit | Attending: Internal Medicine | Admitting: Internal Medicine

## 2013-02-01 DIAGNOSIS — F17200 Nicotine dependence, unspecified, uncomplicated: Secondary | ICD-10-CM | POA: Insufficient documentation

## 2013-02-01 DIAGNOSIS — IMO0001 Reserved for inherently not codable concepts without codable children: Secondary | ICD-10-CM | POA: Insufficient documentation

## 2013-02-01 DIAGNOSIS — J8410 Pulmonary fibrosis, unspecified: Secondary | ICD-10-CM | POA: Insufficient documentation

## 2013-02-01 DIAGNOSIS — R059 Cough, unspecified: Secondary | ICD-10-CM | POA: Insufficient documentation

## 2013-02-01 DIAGNOSIS — R053 Chronic cough: Secondary | ICD-10-CM

## 2013-02-01 DIAGNOSIS — C439 Malignant melanoma of skin, unspecified: Secondary | ICD-10-CM | POA: Insufficient documentation

## 2013-02-01 DIAGNOSIS — R05 Cough: Secondary | ICD-10-CM | POA: Insufficient documentation

## 2013-02-01 DIAGNOSIS — R599 Enlarged lymph nodes, unspecified: Secondary | ICD-10-CM | POA: Insufficient documentation

## 2013-02-01 DIAGNOSIS — Z853 Personal history of malignant neoplasm of breast: Secondary | ICD-10-CM | POA: Insufficient documentation

## 2013-02-01 MED ORDER — IOHEXOL 300 MG/ML  SOLN
80.0000 mL | Freq: Once | INTRAMUSCULAR | Status: AC | PRN
  Administered 2013-02-01: 80 mL via INTRAVENOUS

## 2013-02-01 NOTE — Progress Notes (Signed)
Case discussed with Dr. Wilson at the time of the visit.  We reviewed the resident's history and exam and pertinent patient test results.  I agree with the assessment, diagnosis, and plan of care documented in the resident's note. 

## 2013-02-01 NOTE — Telephone Encounter (Signed)
I spoke with [name] this evening.  I informed her that her chest CT was negative for malignancy.  I told her there was evidence of worsening ILD but that further testing needed to be done to diagnose this.  I told her we would discuss this further at her appointment tomorrow.  She is agreeable.

## 2013-02-02 ENCOUNTER — Ambulatory Visit (INDEPENDENT_AMBULATORY_CARE_PROVIDER_SITE_OTHER): Payer: PRIVATE HEALTH INSURANCE | Admitting: Internal Medicine

## 2013-02-02 ENCOUNTER — Encounter: Payer: Self-pay | Admitting: Internal Medicine

## 2013-02-02 VITALS — BP 116/67 | HR 79 | Temp 98.3°F | Ht 67.0 in

## 2013-02-02 DIAGNOSIS — R059 Cough, unspecified: Secondary | ICD-10-CM

## 2013-02-02 DIAGNOSIS — E876 Hypokalemia: Secondary | ICD-10-CM

## 2013-02-02 DIAGNOSIS — R053 Chronic cough: Secondary | ICD-10-CM

## 2013-02-02 DIAGNOSIS — Z720 Tobacco use: Secondary | ICD-10-CM

## 2013-02-02 DIAGNOSIS — R05 Cough: Secondary | ICD-10-CM

## 2013-02-02 DIAGNOSIS — F17200 Nicotine dependence, unspecified, uncomplicated: Secondary | ICD-10-CM

## 2013-02-02 LAB — BASIC METABOLIC PANEL WITH GFR
BUN: 9 mg/dL (ref 6–23)
CALCIUM: 8.9 mg/dL (ref 8.4–10.5)
CO2: 27 meq/L (ref 19–32)
Chloride: 107 mEq/L (ref 96–112)
Creat: 0.61 mg/dL (ref 0.50–1.10)
GFR, Est African American: >89 mL/min
GFR, Est Non African American: >89 mL/min
GLUCOSE: 105 mg/dL — AB (ref 70–99)
Potassium: 4 mEq/L (ref 3.5–5.3)
Sodium: 141 mEq/L (ref 135–145)

## 2013-02-02 NOTE — Assessment & Plan Note (Signed)
  Assessment: Progress toward smoking cessation:  smoking the same amount Barriers to progress toward smoking cessation:   addiction    Plan: Instruction/counseling given:  I counseled patient on the dangers of tobacco use, advised patient to stop smoking, and reviewed strategies to maximize success. Educational resources provided:  [employer] account manager) brochure

## 2013-02-02 NOTE — Assessment & Plan Note (Addendum)
Pt with a K+ of 3.0 on 01/21/13. She was started on 29mEq KCl x7 days. She has completed the course of her potassium supplementation. Will repeat BMP today to see how her K+ is doing. - BMP  Addendum: 02/03/13  Potassium is 4.0; at this time, she does not require further potassium supplementation

## 2013-02-02 NOTE — Patient Instructions (Signed)
Work on quitting smoking! This will help improve your cough!    Smoking Cessation Quitting smoking is important to your health and has many advantages. However, it is not always easy to quit since nicotine is a very addictive drug. Often times, people try 3 times or more before being able to quit. This document explains the best ways for you to prepare to quit smoking. Quitting takes hard work and a lot of effort, but you can do it. ADVANTAGES OF QUITTING SMOKING  You will live longer, feel better, and live better.  Your body will feel the impact of quitting smoking almost immediately.  Within 20 minutes, blood pressure decreases. Your pulse returns to its normal level.  After 8 hours, carbon monoxide levels in the blood return to normal. Your oxygen level increases.  After 24 hours, the chance of having a heart attack starts to decrease. Your breath, hair, and body stop smelling like smoke.  After 48 hours, damaged nerve endings begin to recover. Your sense of taste and smell improve.  After 72 hours, the body is virtually free of nicotine. Your bronchial tubes relax and breathing becomes easier.  After 2 to 12 weeks, lungs can hold more air. Exercise becomes easier and circulation improves.  The risk of having a heart attack, stroke, cancer, or lung disease is greatly reduced.  After 1 year, the risk of coronary heart disease is cut in half.  After 5 years, the risk of stroke falls to the same as a nonsmoker.  After 10 years, the risk of lung cancer is cut in half and the risk of other cancers decreases significantly.  After 15 years, the risk of coronary heart disease drops, usually to the level of a nonsmoker.  If you are pregnant, quitting smoking will improve your chances of having a healthy baby.  The people you live with, especially any children, will be healthier.  You will have extra money to spend on things other than cigarettes. QUESTIONS TO THINK ABOUT BEFORE  ATTEMPTING TO QUIT You may want to talk about your answers with your caregiver.  Why do you want to quit?  If you tried to quit in the past, what helped and what did not?  What will be the most difficult situations for you after you quit? How will you plan to handle them?  Who can help you through the tough times? Your family? Friends? A caregiver?  What pleasures do you get from smoking? What ways can you still get pleasure if you quit? Here are some questions to ask your caregiver:  How can you help me to be successful at quitting?  What medicine do you think would be best for me and how should I take it?  What should I do if I need more help?  What is smoking withdrawal like? How can I get information on withdrawal? GET READY  Set a quit date.  Change your environment by getting rid of all cigarettes, ashtrays, matches, and lighters in your home, car, or work. Do not let people smoke in your home.  Review your past attempts to quit. Think about what worked and what did not. GET SUPPORT AND ENCOURAGEMENT You have a better chance of being successful if you have help. You can get support in many ways.  Tell your family, friends, and co-workers that you are going to quit and need their support. Ask them not to smoke around you.  Get individual, group, or telephone counseling and support. Programs are  available at local hospitals and health centers. Call your local health department for information about programs in your area.  Spiritual beliefs and practices may help some smokers quit.  Download a "quit meter" on your computer to keep track of quit statistics, such as how long you have gone without smoking, cigarettes not smoked, and money saved.  Get a self-help book about quitting smoking and staying off of tobacco. [place] yourself from urges to smoke. Talk to someone, go for a walk, or occupy your time with a task.  Change your normal  routine. Take a different route to work. Drink tea instead of coffee. Eat breakfast in a different place.  Reduce your stress. Take a hot bath, exercise, or read a book.  Plan something enjoyable to do every day. Reward yourself for not smoking.  Explore interactive web-based programs that specialize in helping you quit. GET MEDICINE AND USE IT CORRECTLY Medicines can help you stop smoking and decrease the urge to smoke. Combining medicine with the above behavioral methods and support can greatly increase your chances of successfully quitting smoking.  Nicotine replacement therapy helps deliver nicotine to your body without the negative effects and risks of smoking. Nicotine replacement therapy includes nicotine gum, lozenges, inhalers, nasal sprays, and skin patches. Some may be available over-the-counter and others require a prescription.  Antidepressant medicine helps people abstain from smoking, but how this works is unknown. This medicine is available by prescription.  Nicotinic receptor partial agonist medicine simulates the effect of nicotine in your brain. This medicine is available by prescription. Ask your caregiver for advice about which medicines to use and how to use them based on your health history. Your caregiver will tell you what side effects to look out for if you choose to be on a medicine or therapy. Carefully read the information on the package. Do not use any other product containing nicotine while using a nicotine replacement product.  RELAPSE OR DIFFICULT SITUATIONS Most relapses occur within the first 3 months after quitting. Do not be discouraged if you start smoking again. Remember, most people try several times before finally quitting. You may have symptoms of withdrawal because your body is used to nicotine. You may crave cigarettes, be irritable, feel very hungry, cough often, get headaches, or have difficulty concentrating. The withdrawal symptoms are only temporary.  They are strongest when you first quit, but they will go away within 10 14 days. To reduce the chances of relapse, try to:  Avoid drinking alcohol. Drinking lowers your chances of successfully quitting.  Reduce the amount of caffeine you consume. Once you quit smoking, the amount of caffeine in your body increases and can give you symptoms, such as a rapid heartbeat, sweating, and anxiety.  Avoid smokers because they can make you want to smoke.  Do not let weight gain distract you. Many smokers will gain weight when they quit, usually less than 10 pounds. Eat a healthy diet and stay active. You can always lose the weight gained after you quit.  Find ways to improve your mood other than smoking. FOR MORE INFORMATION  www.smokefree.gov  Document Released: 12/31/2000 Document Revised: 07/08/2011 Document Reviewed: 04/17/2011 [facility] Patient Information 2014 [place], [place].

## 2013-02-02 NOTE — Assessment & Plan Note (Signed)
Given the patient's recent CT findings, will check full set of PFTs with lung volumes. She is to return to the clinic in about 1 month.

## 2013-02-02 NOTE — Progress Notes (Signed)
Patient ID: [name], female   DOB: [birth year], 68 y.o.   MRN: [id number]  Subjective:   [name]   DOB: December 27, 1945 68 y.o.   MRN: [id number]  HPI: [name] is a 68 y.o. 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  She was seen on 01/21/13 for a cough. A CXR revealed possible nodularity of the left lung and a CT chest was obtained to r/o malignancy. The CT chest showed stable mildly enlarged mediastinal and left hilar lymph nodes of uncertain etiology and progressive peripheral chronic interstitial lung disease/fibrosis since 2011.   Her potassium was also noted to be 3.0 on 1/2, and she was started on 55mEq KCl daily x 7days. She has completed the course of the supplement and is requesting repeat labs today to evaluate her potassium.  Pt c/o continue productive cough with white mucus. She denies fevers or chills.   Past Medical History  Diagnosis Date  . Chronic low back pain 11/05/2005    s/p lumbar stenosis decompression surgery with residual pain  . Arthritis of left knee 01/30/2012  . Fibromyalgia 01/30/2012  . 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  On life-long anticoagulation.   . Chronic venous insufficiency 01/30/2012    Left lower extremity s/p DVT   . Morbid obesity with BMI of 40.0-44.9, adult 01/02/2011  . Gastroesophageal reflux disease 01/30/2012  . 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 Benson Norway  . Major depression 11/05/2005  . Anxiety 01/30/2012  . Hyperlipidemia LDL goal < 130 01/30/2012  . Urge urinary incontinence 03/25/2008    Urge predominant, minor component or stress.  Nocturnal.   . Obstructive sleep apnea 11/05/2005    Non-compliant with nocturnal nasal CPAP secondary to it  being "ineffective"   . Tobacco abuse 11/05/2005  . Irritable bowel syndrome   . Interstitial cystitis     followed by Dr. Risa Grill  . 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 Breast cancer     Bilateral s/p mastectomies  . Left cataract     Surgery pending  . Bell's palsy 1980's  . History of blood transfusion     During GAVE associated GI bleed  . Complication of anesthesia     Hard time putting patient to sleep  . Family history of anesthesia complication     "Mama couldn't wake up easy"   Current Outpatient Prescriptions  Medication Sig Dispense Refill  . alendronate (FOSAMAX) 70 MG tablet Take 1 tablet (70 mg total) by mouth every 7 (seven) days. Take with a full glass of water on an empty stomach.  12 tablet  3  . calcium citrate-vitamin D (CITRACAL+D) 315-200 MG-UNIT per tablet Take 2 tablets by mouth 2 (two) times daily.  360 tablet  3  . docusate sodium (COLACE) 100 MG capsule Take 1 capsule (100 mg total) by mouth 2 (two) times daily.  40 capsule  0  . docusate sodium (COLACE) 100 MG capsule Take 1 capsule (100 mg total) by mouth 2 (two) times daily.  30 capsule  0  . doxycycline (VIBRA-TABS) 100 MG tablet Take 1 tablet (100 mg total) by mouth 2 (two) times daily.  14 tablet  0  . enoxaparin (LOVENOX) 100 MG/ML injection Inject 1 mL (100 mg total) into the skin 2 (two) times daily.  60 Syringe  4  . ferrous sulfate 325 (65 FE) MG EC tablet Take 325 mg by mouth 3 (three) times daily with meals.      . gabapentin (NEURONTIN) 100 MG capsule Take 100 mg by mouth 3 (three) times daily.       Marland Kitchen guaiFENesin (MUCINEX) 600 MG 12 hr tablet Take 1 tablet (600 mg total) by mouth 2 (two) times daily as needed.  60 tablet  0  . HYDROcodone-acetaminophen (NORCO) 10-325 MG per tablet       . lamoTRIgine (LAMICTAL) 200 MG tablet Take 200 mg by mouth every morning.      Marland Kitchen LORazepam (ATIVAN) 1 MG tablet Take 1-2 mg by mouth 2 (two) times daily. Takes 1 tablet in morning and 2 tablets at bedtime       . pantoprazole (PROTONIX) 40 MG tablet Take 1 tablet (40 mg total) by mouth daily.  30 tablet  0  . potassium chloride 20 MEQ TBCR Take 40 mEq by mouth daily.  14 tablet  0  . pravastatin (PRAVACHOL) 80 MG tablet Take 1 tablet (80 mg total) by mouth daily.  90 tablet  3  . QUEtiapine (SEROQUEL) 200 MG tablet Take 400 mg by mouth at bedtime.       . sertraline (ZOLOFT) 100 MG tablet Take 200 mg by mouth every morning.       . vitamin C (ASCORBIC ACID) 500 MG tablet Take 1 tablet (500 mg total) by mouth daily.  90 tablet  0   No current facility-administered medications for this visit.   Family History  Problem Relation Age of Onset  . Heart disease Mother   . Deep vein thrombosis Mother   . Cancer - Lung Brother   . Alcohol abuse Father   . Schizophrenia Father   . Anxiety disorder Son   . Healthy Brother   . Healthy Brother    History   Social History  . Marital Status: Single    Spouse Name: N/A    Number of Children: N/A  . Years of Education: N/A   Social History Main Topics  . Smoking status: Current Every Day Smoker -- 0.50 packs/day for 40 years    Types: Cigarettes  . Smokeless tobacco: Never Used     Comment: Trying to cut down.  . Alcohol Use: No  . Drug Use: No  . Sexual Activity: None     Comment: QUIT ONE MONTH AGO   Other Topics Concern  . None   Social History Narrative  . None   Review of Systems: A 12 point ROS was performed; pertinent positives and negatives were noted in the HPI   Objective:  Physical Exam: Filed Vitals:   02/02/13 1342  BP: 116/67  Pulse: 79  Temp: 98.3 F (36.8 C)  TempSrc: Oral  Height: 5\' 7"  (1.702 m)  SpO2: 97%   Constitutional: Vital signs reviewed.  Patient is a well-developed and well-nourished female in no acute distress and cooperative with exam. Alert and oriented x3.  Head: Normocephalic and atraumatic Eyes: PERRL, EOMI.  Cardiovascular: RRR, no MRG Pulmonary/Chest: normal respiratory effort, CTAB, no  wheezes, rales, or rhonchi Abdominal: Soft. Non-tender, non-distended Musculoskeletal: Moves all 4 extremities Neurological: A&O x3 Skin: Non-pitting edema of BLE Psychiatric: Normal mood and affect. speech and behavior is normal.   Assessment & Plan:  Please refer to Problem List based Assessment and Plan

## 2013-02-03 ENCOUNTER — Telehealth: Payer: Self-pay | Admitting: *Deleted

## 2013-02-03 NOTE — Addendum Note (Signed)
Addended by: Clayburn Pert F on: 02/03/2013 02:55 PM   Modules accepted: Orders

## 2013-02-03 NOTE — Telephone Encounter (Signed)
Pt was called and given appointment for PFTs on 02/09/2013 at 3:00 PM to arrive by 2:45 PM in the [facility] to register. Pt voiced an understanding of the plan.  Sander Nephew, RN 02/03/2013 1:39 PM

## 2013-02-04 ENCOUNTER — Telehealth: Payer: Self-pay | Admitting: *Deleted

## 2013-02-04 NOTE — Telephone Encounter (Signed)
Pt notified and is relieved.

## 2013-02-04 NOTE — Telephone Encounter (Signed)
Pt calls and states she needs to know if she needs to continue taking K+, and if she is, she needs a script called in asap so her aid can go pick it up from pharm, please advise asap

## 2013-02-04 NOTE — Telephone Encounter (Signed)
Her potassium was normal. She does not need to continue to take the potassium supplement.

## 2013-02-09 ENCOUNTER — Ambulatory Visit (HOSPITAL_COMMUNITY)
Admission: RE | Admit: 2013-02-09 | Discharge: 2013-02-09 | Disposition: A | Discharge status: Home / Self Care | Payer: PRIVATE HEALTH INSURANCE | Source: Ambulatory Visit | Attending: Internal Medicine | Admitting: Internal Medicine

## 2013-02-09 DIAGNOSIS — R0989 Other specified symptoms and signs involving the circulatory and respiratory systems: Secondary | ICD-10-CM | POA: Insufficient documentation

## 2013-02-09 DIAGNOSIS — R0609 Other forms of dyspnea: Secondary | ICD-10-CM | POA: Insufficient documentation

## 2013-02-09 DIAGNOSIS — R053 Chronic cough: Secondary | ICD-10-CM

## 2013-02-09 DIAGNOSIS — R059 Cough, unspecified: Secondary | ICD-10-CM | POA: Insufficient documentation

## 2013-02-09 DIAGNOSIS — R05 Cough: Secondary | ICD-10-CM

## 2013-02-09 DIAGNOSIS — F17200 Nicotine dependence, unspecified, uncomplicated: Secondary | ICD-10-CM | POA: Insufficient documentation

## 2013-02-09 LAB — PULMONARY FUNCTION TEST
DL/VA % PRED: 79 %
DL/VA: 3.92 ml/min/mmHg/L
DLCO UNC: 13.58 ml/min/mmHg
DLCO unc % pred: 53 %
FEF 25-75 Post: 0.74 L/sec
FEF 25-75 Pre: 1.67 L/sec
FEF2575-%CHANGE-POST: -55 %
FEF2575-%PRED-PRE: 81 %
FEF2575-%Pred-Post: 36 %
FEV1-%CHANGE-POST: -19 %
FEV1-%Pred-Post: 64 %
FEV1-%Pred-Pre: 80 %
FEV1-Post: 1.56 L
FEV1-Pre: 1.94 L
FEV1FVC-%Change-Post: -11 %
FEV1FVC-%PRED-PRE: 97 %
FEV6-%Change-Post: -5 %
FEV6-%Pred-Post: 78 %
FEV6-%Pred-Pre: 83 %
FEV6-Post: 2.39 L
FEV6-Pre: 2.54 L
FEV6FVC-%PRED-PRE: 104 %
FEV6FVC-%Pred-Post: 104 %
FVC-%Change-Post: -8 %
FVC-%Pred-Post: 75 %
FVC-%Pred-Pre: 82 %
FVC-Post: 2.4 L
FVC-Pre: 2.63 L
POST FEV6/FVC RATIO: 100 %
Post FEV1/FVC ratio: 65 %
Pre FEV1/FVC ratio: 74 %
Pre FEV6/FVC Ratio: 100 %
RV % pred: 66 %
RV: 1.47 L
TLC % PRED: 82 %
TLC: 4.28 L

## 2013-02-09 MED ORDER — ALBUTEROL SULFATE (2.5 MG/3ML) 0.083% IN NEBU
2.5000 mg | INHALATION_SOLUTION | Freq: Once | RESPIRATORY_TRACT | Status: AC
  Administered 2013-02-09: 2.5 mg via RESPIRATORY_TRACT

## 2013-02-10 ENCOUNTER — Encounter: Payer: Self-pay | Admitting: Internal Medicine

## 2013-02-13 NOTE — Progress Notes (Signed)
I saw and evaluated the patient.  I personally confirmed the key portions of Dr. Roda Shutters history and exam and reviewed pertinent patient test results.  The assessment, diagnosis, and plan were formulated together and I agree with the documentation in the resident's note.  She has patchy ground glass changes on her CT scan.  This may be secondary to a viral infection or a bronchiolitis secondary to smoking.  We spent time discussing this and reviewing the harms of persistent smoking.  She is in the pre-contemplative stage.  We will continue to discuss this at follow-up visits as it is probably one of the most important issues impacting upon her future health.

## 2013-02-16 ENCOUNTER — Telehealth: Payer: Self-pay | Admitting: *Deleted

## 2013-02-16 DIAGNOSIS — R195 Other fecal abnormalities: Secondary | ICD-10-CM

## 2013-02-16 DIAGNOSIS — J40 Bronchitis, not specified as acute or chronic: Secondary | ICD-10-CM

## 2013-02-16 DIAGNOSIS — K219 Gastro-esophageal reflux disease without esophagitis: Secondary | ICD-10-CM

## 2013-02-16 MED ORDER — PANTOPRAZOLE SODIUM 40 MG PO TBEC
40.0000 mg | DELAYED_RELEASE_TABLET | Freq: Every day | ORAL | Status: DC
Start: 2013-02-16 — End: 2015-05-28

## 2013-02-16 MED ORDER — ALBUTEROL SULFATE (2.5 MG/3ML) 0.083% IN NEBU: 2.5000 mg | INHALATION_SOLUTION | Freq: Four times a day (QID) | RESPIRATORY_TRACT | Status: DC | PRN

## 2013-02-16 NOTE — Telephone Encounter (Signed)
Pt would like for dr Eppie Gibson to call her and discuss the results of her PFT's, you may call her at [phone] or if needed i can deliver a message to her

## 2013-02-16 NOTE — Telephone Encounter (Signed)
Pt called - it  has been 2 years since pt did albuterol nebulizer - [facility] staff -  states to pt that it would help her. Next appt in clinic is early 03/2013.  Pt would like Rx sent to [facility]. Hilda Blades Lummie Montijo RN 02/16/13 4:15PM

## 2013-02-16 NOTE — Telephone Encounter (Signed)
I called Ms. [name] and we discussed the results of the PFTs.  She mentioned that she needed a refill of her protonix and this was done.  She also said that her insurance company yearly check revealed blood in her stool.  I do not have this result.  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  She states that Dr. Ronalee Red dismissed her from his practice.  We will therefore submit a consult to another GI practice to consider colonoscopy.

## 2013-02-18 ENCOUNTER — Ambulatory Visit: Payer: PRIVATE HEALTH INSURANCE | Admitting: Hematology and Oncology

## 2013-02-21 ENCOUNTER — Encounter: Payer: Self-pay | Admitting: *Deleted

## 2013-02-28 ENCOUNTER — Telehealth: Payer: Self-pay | Admitting: Hematology and Oncology

## 2013-02-28 NOTE — Telephone Encounter (Signed)
pt called for appt. r/s 1/30 appt to 2/17 @ 2:45pm. pt has new d/t.

## 2013-03-07 ENCOUNTER — Telehealth: Payer: Self-pay | Admitting: Hematology and Oncology

## 2013-03-07 NOTE — Telephone Encounter (Signed)
pt called to cx appt due to weather and will call back to r/s

## 2013-03-08 ENCOUNTER — Ambulatory Visit: Payer: PRIVATE HEALTH INSURANCE | Admitting: Hematology and Oncology

## 2013-03-11 ENCOUNTER — Ambulatory Visit: Payer: PRIVATE HEALTH INSURANCE | Admitting: Internal Medicine

## 2013-03-24 ENCOUNTER — Telehealth: Payer: Self-pay | Admitting: Licensed Clinical Social Worker

## 2013-03-24 NOTE — Telephone Encounter (Signed)
Ms. [name] left voice mail with CSW that she is having issues with [facility] and inquiring if there is another agency available.  CSW returned call to [name].  Pt states she has an appt scheduled with [facility] on 3/6 that she would like to reschedule.  Ms. [name] states her aide will not be able to assist her on 3/6 and would like to reschedule with Dr. [name].  Pt would like to keep her GI appt on 03/28/13.  CSW informed Ms. [name] of [place] and CSW will attempt to get transportation to that appt.thru [place].

## 2013-03-25 ENCOUNTER — Ambulatory Visit: Payer: PRIVATE HEALTH INSURANCE | Admitting: Internal Medicine

## 2013-03-25 NOTE — Telephone Encounter (Signed)
CSW secured transportation for Ms. [name]'s appointment on 03/28/13 with [facility].  However, CSW placed call to Ms. [name] this morning, pt had already canceled appointment.  Ms. [name] did not think transportation would be arranged and rescheduled appointment for April 2015.  [facility] was set up to provide Ms. [name] with transportation under [employer] for 30 days.  Rescheduled appointment is beyond 30 days.  Ms. [name] notified to contact her medicaid caseworker to complete at Transportation Assessment form.  Once this form is completed, pt can continue to use [facility] for medical transportation.  CSW provided Ms. [name] with contact numbers.  CSW will sign off.

## 2013-03-28 ENCOUNTER — Ambulatory Visit: Payer: PRIVATE HEALTH INSURANCE | Admitting: Internal Medicine

## 2013-05-03 ENCOUNTER — Ambulatory Visit: Payer: PRIVATE HEALTH INSURANCE | Admitting: Internal Medicine

## 2013-05-13 ENCOUNTER — Encounter: Payer: Self-pay | Admitting: Internal Medicine

## 2013-05-13 ENCOUNTER — Ambulatory Visit (INDEPENDENT_AMBULATORY_CARE_PROVIDER_SITE_OTHER): Payer: PRIVATE HEALTH INSURANCE | Admitting: Internal Medicine

## 2013-05-13 VITALS — BP 153/83 | HR 70 | Temp 97.5°F | Wt 295.3 lb

## 2013-05-13 DIAGNOSIS — F17200 Nicotine dependence, unspecified, uncomplicated: Secondary | ICD-10-CM

## 2013-05-13 DIAGNOSIS — E785 Hyperlipidemia, unspecified: Secondary | ICD-10-CM

## 2013-05-13 DIAGNOSIS — I82409 Acute embolism and thrombosis of unspecified deep veins of unspecified lower extremity: Secondary | ICD-10-CM

## 2013-05-13 DIAGNOSIS — R053 Chronic cough: Secondary | ICD-10-CM

## 2013-05-13 DIAGNOSIS — R059 Cough, unspecified: Secondary | ICD-10-CM

## 2013-05-13 DIAGNOSIS — R05 Cough: Secondary | ICD-10-CM

## 2013-05-13 DIAGNOSIS — K31819 Angiodysplasia of stomach and duodenum without bleeding: Secondary | ICD-10-CM

## 2013-05-13 DIAGNOSIS — Z720 Tobacco use: Secondary | ICD-10-CM

## 2013-05-13 DIAGNOSIS — N3941 Urge incontinence: Secondary | ICD-10-CM

## 2013-05-13 DIAGNOSIS — Z0000 Encounter for general adult medical examination without abnormal findings: Secondary | ICD-10-CM

## 2013-05-13 NOTE — Assessment & Plan Note (Signed)
She states she is no longer a member of Dr. Ulyses Amor practice. I encouraged her to mention this diagnosis and need for followup with Dr. Henrene Pastor when she meets with him next week for her colonoscopy for heme positive stools.

## 2013-05-13 NOTE — Assessment & Plan Note (Signed)
This is the most important reversible risk factor for [name]. At each visit we have discussed the importance of tobacco cessation on her health including her chronic cough. She's been in the pre-contemplative stage but does state she is decreasing the number of cigarettes she has been smoking. She remains in the pre-contemplative stage, but feel she is closer to getting to the point of excepting the need to quit. She was offered Chantix, buproprion, nicotine gum, and the nicotine patches in order to help her with her efforts of quitting. She states she's not quite ready, but we will rediscuss that topic at the followup visit. She does note she is not interested in Chantix given the side effects that are mentioned in the television commercial and is entering at this time.

## 2013-05-13 NOTE — Assessment & Plan Note (Signed)
Her urge urinary incontinence is unchanged despite continuing to perform the Kegel exercises. I offered her referral to GYN or urology to further assess this problem. She is currently not interested in a referral at this time, wanting to address the heme positive stools first. We will rediscuss the issue at the followup visit to see if she may be interested in a specialty referral at that time for her urge urinary incontinence. In the meantime, we will continue with the Kegel exercises.

## 2013-05-13 NOTE — Progress Notes (Signed)
   Subjective:    Patient ID: [name], female    DOB: 10-10-1945, 68 y.o.   MRN: [id number]  HPI  Please see the A&P for the status of the pt's chronic medical problems.  Review of Systems  Constitutional: Negative for activity change, appetite change and unexpected weight change.  Respiratory: Positive for cough and shortness of breath. Negative for wheezing.   Cardiovascular: Negative for chest pain, palpitations and leg swelling.  Gastrointestinal: Positive for diarrhea. Negative for nausea, vomiting and constipation.  Genitourinary:       Urinary incontinence despite Kegel exercises.  Musculoskeletal: Positive for arthralgias and myalgias. Negative for joint swelling.  Skin: Negative for color change, pallor and wound.  Neurological: Negative for seizures, syncope and weakness.      Objective:   Physical Exam  Nursing note and vitals reviewed. Constitutional: She is oriented to person, place, and time. She appears well-developed and well-nourished. No distress.  HENT:  Head: Normocephalic and atraumatic.  Eyes: Conjunctivae are normal. Right eye exhibits no discharge. Left eye exhibits no discharge. No scleral icterus.  Cardiovascular: Normal rate, regular rhythm and normal heart sounds.  Exam reveals no gallop and no friction rub.   No murmur heard. Pulmonary/Chest: Effort normal and breath sounds normal. No respiratory distress. She has no wheezes. She has no rales.  Abdominal: Soft. Bowel sounds are normal. She exhibits no distension. There is no tenderness. There is no rebound and no guarding.  Musculoskeletal: Normal range of motion. She exhibits no edema and no tenderness.  Neurological: She is alert and oriented to person, place, and time. She exhibits normal muscle tone.  Skin: Skin is warm and dry. No rash noted. She is not diaphoretic. No erythema.  Psychiatric: She has a normal mood and affect. Her behavior is normal. Judgment and thought content normal.      Assessment & Plan:   Please see problem oriented charting.

## 2013-05-13 NOTE — Assessment & Plan Note (Signed)
She now meets the diagnostic criteria and for chronic cough. She is not on an ACE inhibitor and has had a recent CT scan without parenchymal disease to explain the cough. She does continue to smoke and I'm concerned this is the main cause for her chronic cough. I stressed the importance of smoking cessation both for her overall health as well as for control of her chronic cough. Other things in the differential for a chronic cough include gastroesophageal reflux disease, although she has been on pantoprazole for more than 3 months with continued cough. She may have an upper airway cough syndrome with a postnasal drip that is driving her chronic cough. If the cough continues, we will consider adding a sedating antihistamine or a nasal steroid spray in hopes of improving any subclinical postnasal drip that is resulting in a chronic cough. Finally, the differential includes cough variant asthma and non-asthmatic eosinophilic bronchitis. If she continues to have a chronic cough despite aggressive therapy for a upper airway cough syndrome we will consider initiation of an inhaled steroid. Given her multiple medical problems and body habitus I am hesitant to start oral prednisone empirically.

## 2013-05-13 NOTE — Assessment & Plan Note (Signed)
At the followup visit we will offer her a Tdap and a Zostavax.

## 2013-05-13 NOTE — Patient Instructions (Signed)
It was good to see you again.  I am sorry that your pain is still bad.  1) Keep taking your medications as you are.  2) I will have [facility] remove the oxygen.  3) Please follow-up with GI as scheduled for your colonoscopy.  4) Keep working on Biomedical scientist exercises.  When you are ready I will send you to GYN or Urology for further evaluation.  5) I will see you back in 2 months to review your progress, sooner if necessary.

## 2013-05-13 NOTE — Assessment & Plan Note (Signed)
She is tolerating the enoxaparin well. We will continue this therapy. 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

## 2013-05-13 NOTE — Assessment & Plan Note (Signed)
At the last clinic visit her pravastatin was increased to 80 mg by mouth daily. This was in response to her LDL being above target. Unfortunately, her pharmacy sent her both the pravastatin 40 mg by mouth daily and the pravastatin 80 mg by mouth daily. She was therefore confused as to which dose she was to take. She therefore decided to take the 40 mg by mouth daily dose. As this dose was inadequate in achieving an LDL at target it does not make sense to recheck the LDL today. I reinformed her that the dose that she is to be on is pravastatin 80 mg by mouth daily. She states she will take this dose from this point forward. At the followup visit we will check a lipid panel to assure that her LDL is at target on this higher dose.

## 2013-06-22 ENCOUNTER — Other Ambulatory Visit: Payer: PRIVATE HEALTH INSURANCE

## 2013-06-22 ENCOUNTER — Encounter: Payer: Self-pay | Admitting: Internal Medicine

## 2013-06-22 ENCOUNTER — Other Ambulatory Visit (INDEPENDENT_AMBULATORY_CARE_PROVIDER_SITE_OTHER): Payer: PRIVATE HEALTH INSURANCE

## 2013-06-22 ENCOUNTER — Ambulatory Visit (INDEPENDENT_AMBULATORY_CARE_PROVIDER_SITE_OTHER): Payer: PRIVATE HEALTH INSURANCE | Admitting: Internal Medicine

## 2013-06-22 VITALS — BP 130/72 | HR 76

## 2013-06-22 DIAGNOSIS — Z1211 Encounter for screening for malignant neoplasm of colon: Secondary | ICD-10-CM

## 2013-06-22 DIAGNOSIS — K8020 Calculus of gallbladder without cholecystitis without obstruction: Secondary | ICD-10-CM

## 2013-06-22 DIAGNOSIS — R109 Unspecified abdominal pain: Secondary | ICD-10-CM

## 2013-06-22 DIAGNOSIS — K219 Gastro-esophageal reflux disease without esophagitis: Secondary | ICD-10-CM

## 2013-06-22 DIAGNOSIS — K31819 Angiodysplasia of stomach and duodenum without bleeding: Secondary | ICD-10-CM

## 2013-06-22 LAB — BASIC METABOLIC PANEL
BUN: 8 mg/dL (ref 6–23)
CALCIUM: 8.9 mg/dL (ref 8.4–10.5)
CO2: 35 mEq/L — ABNORMAL HIGH (ref 19–32)
CREATININE: 0.8 mg/dL (ref 0.4–1.2)
Chloride: 101 mEq/L (ref 96–112)
GFR: 79.09 mL/min (ref >60.00)
Glucose, Bld: 100 mg/dL — ABNORMAL HIGH (ref 70–99)
Potassium: 4 mEq/L (ref 3.5–5.1)
Sodium: 142 mEq/L (ref 135–145)

## 2013-06-22 NOTE — Progress Notes (Signed)
HISTORY OF PRESENT ILLNESS:  [name] is a 68 y.o. female with multiple significant medical problems who is sent here regarding "Hemoccult-positive stool" and for colonoscopy. Patient has a myriad of other complaints include abdominal pain, reflux, belching, bloating, gas, nausea, and irritable bowel. Review of the chart shows that the patient has gastric antral vascular ectasia which she was previously treated by Dr. Saralyn Pilar hung. Subsequently discharged from that practice. Seen in this office September 2014 by the advanced practitioner regarding GAVE. Prior colonoscopy 2005 by Dr. Edwina Barth was normal. Recall for 2015 plan. Terms of the patient's normal pain. She describes it as diffuse, constant, daily. Symptoms are exacerbated by bending and lifting. Improved with lying flat. She is known to have gallstones. She denies melena or hematochezia. She continues on chronic iron therapy. Last hemoglobin normal. Hemoccult studies were NEGATIVE. Last Hemoccult study January 2015 was normal. The patient has had progressive decline in her health status over the past few years. She is essentially wheelchair walker bound. Severe arthritis. Continues to smoke. Multiple medications including chronic anticoagulation with Lovenox. BMI 45-50 range estimated  REVIEW OF SYSTEMS:  All non-GI ROS negative except for anxiety, arthritis, back pain, visual change, cough, depression, fatigue, headaches, muscle cramps, night sweats, shortness of breath, sleeping problems, ankle edema, increased thirst, increased urination, increased urinary frequency, urinary leakage  Past Medical History  Diagnosis Date  . Chronic low back pain 11/05/2005    s/p lumbar stenosis decompression surgery with residual pain  . Arthritis of left knee 01/30/2012  . Fibromyalgia 01/30/2012  . 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  On life-long anticoagulation.   .  Chronic venous insufficiency 01/30/2012    Left lower extremity s/p DVT   . Morbid obesity with BMI of 40.0-44.9, adult 01/02/2011  . Gastroesophageal reflux disease 01/30/2012  . 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 Benson Norway  . Major depression 11/05/2005  . Anxiety 01/30/2012  . Hyperlipidemia LDL goal < 130 01/30/2012  . Urge urinary incontinence 03/25/2008    Urge predominant, minor component or stress.  Nocturnal.   . Obstructive sleep apnea 11/05/2005    Non-compliant with nocturnal nasal CPAP secondary to it being "ineffective"   . Tobacco abuse 11/05/2005  . Irritable bowel syndrome   . Interstitial cystitis     followed by Dr. Risa Grill  . 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 Breast cancer     Bilateral s/p mastectomies  . Left cataract     Surgery pending  . Bell's palsy 1980's  . History of blood transfusion     During GAVE associated GI bleed  . Complication of anesthesia     Hard time putting patient to sleep  . Family history of anesthesia complication     "Mama couldn't wake up easy"    Past Surgical History  Procedure Laterality Date  . Cervical discectomy  11/1995 and 05/2002    by Dr. Hal Neer  . Abdominal hysterectomy  1976  . Bladder suspension  1993  . Orif ankle fracture  1993  . Lumbar laminectomy/decompression microdiscectomy  02/2007    by Dr. Hal Neer  . Carpal tunnel release Left   . Tonsillectomy and adenoidectomy  1953  . Appendectomy    . Dilation and curettage of uterus    . Tubal ligation    . Carpectomy  07/23/2011    Procedure: CARPECTOMY;  Surgeon: Linna Hoff, MD;  Location: [facility];  Service: Orthopedics;  Laterality: Left;  LEFT WRIST PROXIMAL ROW CARPECTOMY  . Esophagogastroduodenoscopy  12/25/2011    Procedure: ESOPHAGOGASTRODUODENOSCOPY (EGD);  Surgeon: Beryle Beams, MD;  Location: [facility];  Service: Endoscopy;  Laterality: N/A;  . Breast surgery  1992    Bilateral mastectomies for breast cancer  .  Rhinoplasty  1993  . Spine surgery    . Hot hemostasis  02/06/2012    Procedure: HOT HEMOSTASIS (ARGON PLASMA COAGULATION/BICAP);  Surgeon: Beryle Beams, MD;  Location: [facility];  Service: Endoscopy;  Laterality: N/A;  . Esophagogastroduodenoscopy  02/06/2012    Procedure: ESOPHAGOGASTRODUODENOSCOPY (EGD);  Surgeon: Beryle Beams, MD;  Location: [facility];  Service: Endoscopy;  Laterality: N/A;  . Esophagogastroduodenoscopy N/A 04/02/2012    Procedure: ESOPHAGOGASTRODUODENOSCOPY (EGD);  Surgeon: Beryle Beams, MD;  Location: [facility];  Service: Endoscopy;  Laterality: N/A;  EGD w/ APC  . Back surgery      x 3  . Tendon transfer Left 06/24/2012    "thumb" (06/24/2012)  . Mastectomy Bilateral   . Melanoma excision Left     "side of my nose" (06/24/2012)  . Tendon transfer Left 06/24/2012    Procedure: LEFT THUMB EIP TO EPL TENDON TRANSFER;  Surgeon: Linna Hoff, MD;  Location: [facility];  Service: Orthopedics;  Laterality: Left;    Social History [name]  reports that she has been smoking Cigarettes.  She has a 20 pack-year smoking history. She has never used smokeless tobacco. She reports that she does not drink alcohol or use illicit drugs.  family history includes Alcohol abuse in her father; Anxiety disorder in her son; Cancer - Lung in her brother; Deep vein thrombosis in her mother; Healthy in her brother and brother; Heart disease in her mother; Schizophrenia in her father.  Allergies  Allergen Reactions  . Adhesive [Tape] Other (See Comments)    "I break out all over"; Can only use paper tape  . Codeine Other (See Comments)    "first dose I took, my aide found me the next day laying on the floor"  . Morphine Other (See Comments)    caused change in mental status, she does not want to try it again.  . Sulfonamide Derivatives Nausea And Vomiting  . Thorazine [Chlorpromazine] Nausea Only       PHYSICAL EXAMINATION: Vital signs: BP 130/72  Pulse 76 . Patient  could not stand to be weighed Constitutional: Morbidly obese and unhealthy-appearing, no acute distress; reeking of tobacco smell Psychiatric: alert and oriented x3, cooperative Eyes: extraocular movements intact, anicteric, conjunctiva pink Mouth: oral pharynx moist, no lesions Neck: supple no lymphadenopathy Cardiovascular: heart regular rate and rhythm, no murmur Lungs: clear to auscultation bilaterally Abdomen: soft, markedly obese, complaints of tenderness with minimal palpation throughout, nondistended, no obvious ascites, no peritoneal signs, normal bowel sounds, no organomegaly. Bruising from subcutaneous injections Rectal: Omitted Extremities: 1+ lower extremity edema bilaterally Skin: no lesions on visible extremities Neuro: No focal deficits. No asterixis.   ASSESSMENT:  #1. Generalized abdominal pain. Features most consistent with musculoskeletal #2. GERD. On PPI #3. Cholelithiasis. Felt to be incidental at this point #4. Colon cancer screening. Normal colonoscopy 2005 #5. GAVE. At risk for underlying liver disease (NASH) given her body habitus   PLAN:  #1. Reflux precautions with attention to weight loss #2. Continue PPI #3. CT of the abdomen to evaluate chronic abdominal pain #4. The patient has not fit  to undergo optical colonoscopy and its is here and risks. Thus, as a compromise, screening with cologard offered #5. Continue iron indefinitely given a diagnosis of GAVE #6. Stop smoking

## 2013-06-22 NOTE — Patient Instructions (Addendum)
You have been given a separate informational sheet regarding your tobacco use, the importance of quitting and local resources to help you quit.  Your physician has requested that you go to the basement for the following lab work before leaving today:  BMET  You have been scheduled for a CT scan of the abdomen and pelvis at [facility] ([address]---this is in the same building as Press [facility]).   You are scheduled on 6/8/2015at 9:00am. You should arrive 15 minutes prior to your appointment time for registration. Please follow the written instructions below on the day of your exam:  WARNING: IF YOU ARE ALLERGIC TO IODINE/X-RAY DYE, PLEASE NOTIFY RADIOLOGY IMMEDIATELY AT [phone]! YOU WILL BE GIVEN A 13 HOUR PREMEDICATION PREP.  1) Do not eat or drink anything after 5:00am (4 hours prior to your test) 2) You have been given 2 bottles of oral contrast to drink. The solution may taste better if refrigerated, but do NOT add ice or any other liquid to this solution. Shake well before drinking.    Drink 1 bottle of contrast @ 7:00am (2 hours prior to your exam)  Drink 1 bottle of contrast @ 8:00am (1 hour prior to your exam)  You may take any medications as prescribed with a small amount of water except for the following: Metformin, Glucophage, Glucovance, Avandamet, Riomet, Fortamet, Actoplus Met, Janumet, Glumetza or Metaglip. The above medications must be held the day of the exam AND 48 hours after the exam.   Continue taking your Protonix daily.  You will be contacted by [employer] to schedule your Cologuard.  The purpose of you drinking the oral contrast is to aid in the visualization of your intestinal tract. The contrast solution may cause some diarrhea. Before your exam is started, you will be given a small amount of fluid to drink. Depending on your individual set of symptoms, you may also receive an intravenous injection of x-ray contrast/dye. Plan on being  at [facility] for 30 minutes or long, depending on the type of exam you are having performed.  If you have any questions regarding your exam or if you need to reschedule, you may call the CT department at [phone] between the hours of 8:00 am and 5:00 pm, Monday-Friday.  ________________________________________________________________________

## 2013-06-27 ENCOUNTER — Ambulatory Visit (INDEPENDENT_AMBULATORY_CARE_PROVIDER_SITE_OTHER)
Admission: RE | Admit: 2013-06-27 | Discharge: 2013-06-27 | Disposition: A | Discharge status: Home / Self Care | Payer: PRIVATE HEALTH INSURANCE | Source: Ambulatory Visit | Attending: Internal Medicine | Admitting: Internal Medicine

## 2013-06-27 DIAGNOSIS — R109 Unspecified abdominal pain: Secondary | ICD-10-CM

## 2013-06-27 MED ORDER — IOHEXOL 300 MG/ML  SOLN: 100.0000 mL | Freq: Once | INTRAMUSCULAR | Status: AC | PRN

## 2013-07-15 ENCOUNTER — Encounter: Payer: Self-pay | Admitting: Internal Medicine

## 2013-07-15 ENCOUNTER — Ambulatory Visit (INDEPENDENT_AMBULATORY_CARE_PROVIDER_SITE_OTHER): Payer: PRIVATE HEALTH INSURANCE | Admitting: Internal Medicine

## 2013-07-15 VITALS — BP 124/71 | HR 79 | Temp 97.9°F | Wt 297.1 lb

## 2013-07-15 DIAGNOSIS — E785 Hyperlipidemia, unspecified: Secondary | ICD-10-CM

## 2013-07-15 DIAGNOSIS — Z720 Tobacco use: Secondary | ICD-10-CM

## 2013-07-15 DIAGNOSIS — N3946 Mixed incontinence: Secondary | ICD-10-CM

## 2013-07-15 DIAGNOSIS — M1712 Unilateral primary osteoarthritis, left knee: Secondary | ICD-10-CM

## 2013-07-15 DIAGNOSIS — F17200 Nicotine dependence, unspecified, uncomplicated: Secondary | ICD-10-CM

## 2013-07-15 DIAGNOSIS — M1710 Unilateral primary osteoarthritis, unspecified knee: Secondary | ICD-10-CM

## 2013-07-15 DIAGNOSIS — N3941 Urge incontinence: Secondary | ICD-10-CM

## 2013-07-15 DIAGNOSIS — Z86718 Personal history of other venous thrombosis and embolism: Secondary | ICD-10-CM

## 2013-07-15 DIAGNOSIS — F329 Major depressive disorder, single episode, unspecified: Secondary | ICD-10-CM

## 2013-07-15 DIAGNOSIS — F331 Major depressive disorder, recurrent, moderate: Secondary | ICD-10-CM

## 2013-07-15 DIAGNOSIS — K8020 Calculus of gallbladder without cholecystitis without obstruction: Secondary | ICD-10-CM

## 2013-07-15 DIAGNOSIS — I82402 Acute embolism and thrombosis of unspecified deep veins of left lower extremity: Secondary | ICD-10-CM

## 2013-07-15 DIAGNOSIS — IMO0002 Reserved for concepts with insufficient information to code with codable children: Secondary | ICD-10-CM

## 2013-07-15 DIAGNOSIS — Z0000 Encounter for general adult medical examination without abnormal findings: Secondary | ICD-10-CM

## 2013-07-15 HISTORY — DX: Calculus of gallbladder without cholecystitis without obstruction: K80.20

## 2013-07-15 LAB — LIPID PANEL
CHOLESTEROL: 188 mg/dL (ref 0–200)
HDL: 45 mg/dL (ref >39)
LDL Cholesterol: 98 mg/dL (ref 0–99)
TRIGLYCERIDES: 226 mg/dL — AB (ref <150)
Total CHOL/HDL Ratio: 4.2 Ratio
VLDL: 45 mg/dL — ABNORMAL HIGH (ref 0–40)

## 2013-07-15 NOTE — Assessment & Plan Note (Signed)
She states she's been on the pravastatin 80 mg daily. We will therefore check an LDL to make sure she is at her target of less than 130. This result is pending at the time of this dictation.

## 2013-07-15 NOTE — Assessment & Plan Note (Signed)
She is continuing the lifelong enoxaparin which she is tolerating well. I plan a making no changes in his regimen at this time.

## 2013-07-15 NOTE — Assessment & Plan Note (Signed)
She received the Pneumovax 13 today. She's not interested in Zostavax at this time and we will discuss the tetanus booster at the followup visit.

## 2013-07-15 NOTE — Assessment & Plan Note (Signed)
Her weight continues to increase and she is now nearly always wheelchair-bound. Given her gastrointestinal bleeding she is not a candidate for nonsteroidal anti-inflammatories. We will continue with the as needed hydrocodone 10-325 mg.

## 2013-07-15 NOTE — Assessment & Plan Note (Signed)
We discussed the importance of smoking cessation, particularly given her shortness of breath, chronic cough, and pulmonary changes seen on CT scan. I am concerned she is developing a respiratory bronchiolitis related to her smoking cessation. She's not ready to quit smoking at this time and I encouraged her to contact me as soon as she is so that we may help her with her efforts that would include starting a nicotine patch. Obviously, this will be discussed at the followup visit.

## 2013-07-15 NOTE — Patient Instructions (Signed)
It was good to see you again.  I am sorry you are still feeling poorly.  1) Keep taking all of the medications as you are.  2) I checked your cholesterol today.  I will call you next week if we have to make any changes in your medications.  3) I referred you to Gyn for your urinary leaking.  4) I gave you a Pneumovax 13 shot today.  5) Please let me know when you are ready to quit smoking.  This is the most important thing you can do for your health given your lung disease.  6) We are working on getting you a wedge for under your knees to help with your pain.  7) We will also work on trying to have you reassessed for the right wheelchair.  I will see you back in 4 months, sooner if necessary.

## 2013-07-15 NOTE — Progress Notes (Signed)
Total cholesterol 188 Triglycerides 226 HDL 45 LDL 98  LDL at target on pravastatin 80 mg daily.  Will continue at that dose.

## 2013-07-15 NOTE — Assessment & Plan Note (Signed)
She notes she's been under considerable stress lately related to concerns over her health. Nonetheless, Zoloft 200 mg daily has been as effective as any therapy she's been on before. For the time being we will continue the Zoloft 200 mg every morning and reassess her symptoms at the followup visit.

## 2013-07-15 NOTE — Progress Notes (Signed)
   Subjective:    Patient ID: [name], female    DOB: 1945/04/18, 68 y.o.   MRN: [id number]  HPI  Please see the A&P for the status of the pt's chronic medical problems.  Review of Systems  Constitutional: Negative for activity change and unexpected weight change.  Respiratory: Positive for cough, shortness of breath and wheezing. Negative for chest tightness.   Cardiovascular: Positive for leg swelling. Negative for chest pain.  Gastrointestinal: Positive for nausea, vomiting, abdominal pain and abdominal distention.  Musculoskeletal: Positive for arthralgias and myalgias. Negative for joint swelling.  Skin: Negative for color change, pallor and rash.  Psychiatric/Behavioral: Positive for dysphoric mood.      Objective:   Physical Exam  Nursing note and vitals reviewed. Constitutional: She is oriented to person, place, and time. She appears well-developed and well-nourished. No distress.  HENT:  Head: Normocephalic and atraumatic.  Eyes: Conjunctivae are normal. Right eye exhibits no discharge. Left eye exhibits no discharge. No scleral icterus.  Cardiovascular: Normal rate, regular rhythm and normal heart sounds.  Exam reveals no gallop and no friction rub.   No murmur heard. Pulmonary/Chest: Effort normal and breath sounds normal. No respiratory distress. She has no wheezes. She has no rales.  Abdominal: Soft. Bowel sounds are normal. She exhibits no distension. There is no tenderness. There is no rebound and no guarding.  Musculoskeletal: Normal range of motion. She exhibits edema.  Neurological: She is alert and oriented to person, place, and time. She exhibits normal muscle tone.  Skin: Skin is warm and dry. No rash noted. She is not diaphoretic. No erythema.  Psychiatric: She has a normal mood and affect. Her behavior is normal. Judgment and thought content normal.      Assessment & Plan:   Please see problem based charting.

## 2013-07-15 NOTE — Assessment & Plan Note (Signed)
She states she's continued to do the Kegel exercises without any help in her symptoms. Although she was urge predominant she now has a larger stress component to her urinary incontinence which can occur with coughing. At this point she is willing to undergo further evaluation with gynecology to see if there are any other options available for her incontinence. She was encouraged to continue with the Kegel exercises.

## 2013-08-01 ENCOUNTER — Other Ambulatory Visit: Payer: Self-pay | Admitting: *Deleted

## 2013-08-01 DIAGNOSIS — E785 Hyperlipidemia, unspecified: Secondary | ICD-10-CM

## 2013-08-03 MED ORDER — PRAVASTATIN SODIUM 80 MG PO TABS: 80.0000 mg | ORAL_TABLET | Freq: Every day | ORAL | Status: DC

## 2013-08-29 ENCOUNTER — Other Ambulatory Visit: Payer: Self-pay | Admitting: *Deleted

## 2013-08-29 ENCOUNTER — Encounter: Payer: Self-pay | Admitting: *Deleted

## 2013-08-29 DIAGNOSIS — I82402 Acute embolism and thrombosis of unspecified deep veins of left lower extremity: Secondary | ICD-10-CM

## 2013-08-29 MED ORDER — ENOXAPARIN SODIUM 100 MG/ML ~~LOC~~ SOLN: 100.0000 mg | Freq: Two times a day (BID) | SUBCUTANEOUS | Status: DC

## 2013-08-29 NOTE — Telephone Encounter (Signed)
Phone call - encounter closed. 

## 2013-10-21 ENCOUNTER — Other Ambulatory Visit: Payer: Self-pay | Admitting: *Deleted

## 2013-10-21 ENCOUNTER — Encounter: Payer: Self-pay | Admitting: Internal Medicine

## 2013-10-21 DIAGNOSIS — I82402 Acute embolism and thrombosis of unspecified deep veins of left lower extremity: Secondary | ICD-10-CM

## 2013-10-21 MED ORDER — ENOXAPARIN SODIUM 100 MG/ML ~~LOC~~ SOLN: 100.0000 mg | Freq: Two times a day (BID) | SUBCUTANEOUS | Status: DC

## 2013-10-24 ENCOUNTER — Other Ambulatory Visit: Payer: Self-pay | Admitting: *Deleted

## 2013-10-24 DIAGNOSIS — M810 Age-related osteoporosis without current pathological fracture: Secondary | ICD-10-CM

## 2013-10-24 MED ORDER — ALENDRONATE SODIUM 70 MG PO TABS: 70.0000 mg | ORAL_TABLET | ORAL | Status: DC

## 2013-10-26 ENCOUNTER — Encounter: Payer: PRIVATE HEALTH INSURANCE | Admitting: Obstetrics & Gynecology

## 2013-11-04 ENCOUNTER — Ambulatory Visit (INDEPENDENT_AMBULATORY_CARE_PROVIDER_SITE_OTHER): Payer: PRIVATE HEALTH INSURANCE | Admitting: Internal Medicine

## 2013-11-04 ENCOUNTER — Encounter: Payer: Self-pay | Admitting: Internal Medicine

## 2013-11-04 VITALS — BP 158/62 | HR 74 | Temp 97.9°F | Wt 293.4 lb

## 2013-11-04 DIAGNOSIS — Z23 Encounter for immunization: Secondary | ICD-10-CM

## 2013-11-04 DIAGNOSIS — M545 Low back pain: Secondary | ICD-10-CM

## 2013-11-04 DIAGNOSIS — I82402 Acute embolism and thrombosis of unspecified deep veins of left lower extremity: Secondary | ICD-10-CM

## 2013-11-04 DIAGNOSIS — Z0000 Encounter for general adult medical examination without abnormal findings: Secondary | ICD-10-CM

## 2013-11-04 DIAGNOSIS — N3941 Urge incontinence: Secondary | ICD-10-CM

## 2013-11-04 DIAGNOSIS — Z720 Tobacco use: Secondary | ICD-10-CM

## 2013-11-04 DIAGNOSIS — Z6841 Body Mass Index (BMI) 40.0 and over, adult: Secondary | ICD-10-CM

## 2013-11-04 DIAGNOSIS — G8929 Other chronic pain: Secondary | ICD-10-CM

## 2013-11-04 MED ORDER — HYDROCODONE-ACETAMINOPHEN 10-325 MG PO TABS: 1.0000 | ORAL_TABLET | Freq: Four times a day (QID) | ORAL | Status: DC | PRN

## 2013-11-04 NOTE — Addendum Note (Signed)
Addended by: Marcelino Duster on: 11/04/2013 02:55 PM   Modules accepted: Orders

## 2013-11-04 NOTE — Assessment & Plan Note (Signed)
She notes some depression particularly at this time of the year related to past events in her life. She states she's working with a therapist to get through this time and is satisfied with this arrangement as well as with the sertraline 200 mg every morning and Ativan twice a day. We will continue with this pharmacotherapy and I encouraged continued visits with her therapist.

## 2013-11-04 NOTE — Assessment & Plan Note (Signed)
We again discussed how continued smoking impacts upon her chronic dyspnea, wheezing, and cough. We had a long discussion about options for therapy once she is mentally ready to quit. She states at this point she's not ready to quit but did agree to call me as soon as she became ready so that we could initiate pharmacotherapy. When I receive this call we will start with nicotine patches. Obviously, this will be addressed at each visit as it is one of the most important interventions to improve her health moving forward.

## 2013-11-04 NOTE — Assessment & Plan Note (Signed)
She is continuing lifelong Lovenox. She is not having any difficulty with this medication at this time.

## 2013-11-04 NOTE — Assessment & Plan Note (Signed)
Her chronic back pain is unchanged. She does have some pain along the right inguinal area. This began about 3-4 days ago and is worsened by any movement. She denies any knowledge of specific trauma to the area and has not tried anything for the pain. In the past hydrocodone 10 mg with acetaminophen was effective in controlling pain and I gave her a prescription for Narco 10-325 mg one to 2 tablets by mouth every 6 hours as needed for pain, dispense #30 with no refills. We will reassess this pain at the return visit, but I believe it is musculoskeletal or related to a tendinitis secondary to her antalgic gait.

## 2013-11-04 NOTE — Assessment & Plan Note (Signed)
Her morbid obesity is adversely impacting upon her ability to ambulate through strain on her joints, particularly her hips and back. We discussed the importance of keeping active in order to avoid being completely nonambulatory and likely ultimately ending up in a nursing home. This conversation upset her but she realizes the importance of maintaining activity. She was open to home physical therapy in order to receive a prescription for home exercises which can be performed with the assistance of her home health aide. Ambulatory referral for home health, specifically physical therapy was placed. We will reassess her response to this intervention at the followup visit.

## 2013-11-04 NOTE — Patient Instructions (Signed)
It was great to see you again.  1) Keep taking your medications as you are.  We will review them at the next visit.  2) I renewed the hydrocodone for your flank pain.  3) We will try to set up home physical therapy to give you exercise to stay active.  We have to keep you active to keep you from heading to a nursing home.  4) We gave you a flu shot and tetanus booster today.  We will give you a pneumonia shot at the next visit.  I will see you back in 3 months, sooner if necessary.

## 2013-11-04 NOTE — Addendum Note (Signed)
Addended by: Oval Linsey D on: 11/04/2013 01:36 PM   Modules accepted: Orders

## 2013-11-04 NOTE — Assessment & Plan Note (Addendum)
She is scheduled to see women's health to address this issue. We will review there findings and suggestions at the followup visit. In the meantime she is encouraged to continue with the Kegel exercises.

## 2013-11-04 NOTE — Progress Notes (Signed)
   Subjective:    Patient ID: [name], female    DOB: 02/10/1945, 68 y.o.   MRN: [id number]  HPI  Please see the A&P for the status of the pt's chronic medical problems. She had a list of 12-15 questions which we spent the whole visit addressing. We therefore did not do a medication reconciliation during this visit due to lack of time. This will be done at the followup visit.  Review of Systems  Constitutional: Positive for activity change. Negative for unexpected weight change.       Less active  Respiratory: Positive for cough, shortness of breath and wheezing.   Cardiovascular: Negative for chest pain and leg swelling.  Gastrointestinal: Negative for abdominal pain.  Musculoskeletal: Positive for arthralgias, back pain, gait problem and myalgias.  Psychiatric/Behavioral: Positive for dysphoric mood and decreased concentration.      Objective:   Physical Exam  Nursing note and vitals reviewed. Constitutional: She is oriented to person, place, and time. She appears well-developed and well-nourished. No distress.  HENT:  Head: Normocephalic and atraumatic.  Eyes: Conjunctivae are normal. Right eye exhibits no discharge. Left eye exhibits no discharge. No scleral icterus.  Cardiovascular: Normal rate, regular rhythm and normal heart sounds.  Exam reveals no gallop and no friction rub.   No murmur heard. Pulmonary/Chest: Effort normal and breath sounds normal. No respiratory distress. She has no wheezes. She has no rales.  Abdominal: Soft. Bowel sounds are normal. She exhibits no distension. There is no tenderness. There is no rebound and no guarding.  Musculoskeletal: Normal range of motion. She exhibits tenderness. She exhibits no edema.  Tenderness along the right inguinal ligament.  No hernias appreciated.  Neurological: She is alert and oriented to person, place, and time. She exhibits normal muscle tone.  Skin: Skin is warm and dry. No rash noted. She is not diaphoretic.  No erythema.  Psychiatric: She has a normal mood and affect. Her behavior is normal. Judgment and thought content normal.      Assessment & Plan:   Please see problem oriented charting.

## 2013-11-04 NOTE — Assessment & Plan Note (Signed)
She received the flu vaccination and Tdap today. At the followup visit we will give her the Pneumovax. Considerable time was spent counseling her on the importance of smoking cessation and initiating activity. We also discussed the importance of decreasing caloric intake in order to assist with weight loss and reviewed some of the foods that were high in calories and likely interfering with her ability to lose weight, especially in the setting of decreased activity.

## 2013-11-07 ENCOUNTER — Telehealth: Payer: Self-pay | Admitting: Licensed Clinical Social Worker

## 2013-11-07 NOTE — Telephone Encounter (Signed)
[name] was referred to CSW for home health services.  CSW placed call to [name].  Pt has used [facility] in the past and has no preference.  [name] is in agreement for referral to [facility].  Pt requesting services to be rendered during the same time her aide is available.  CSW informed [name] she will be contacted by [facility] to set up first assessment, however, CSW will note pt's preference for noon appointments.  CSW confirmed address and phone number.  Referral to [facility] completed.  Pt denies add'l needs at this time and is aware CSW is available to assist as needed.

## 2013-11-09 ENCOUNTER — Other Ambulatory Visit: Payer: Self-pay | Admitting: *Deleted

## 2013-11-09 DIAGNOSIS — J40 Bronchitis, not specified as acute or chronic: Secondary | ICD-10-CM

## 2013-11-09 MED ORDER — ALBUTEROL SULFATE (2.5 MG/3ML) 0.083% IN NEBU: 2.5000 mg | INHALATION_SOLUTION | Freq: Four times a day (QID) | RESPIRATORY_TRACT | Status: DC | PRN

## 2013-11-28 ENCOUNTER — Encounter: Payer: PRIVATE HEALTH INSURANCE | Admitting: Obstetrics and Gynecology

## 2013-12-29 ENCOUNTER — Other Ambulatory Visit: Payer: Self-pay | Admitting: Internal Medicine

## 2013-12-29 DIAGNOSIS — G8929 Other chronic pain: Secondary | ICD-10-CM

## 2013-12-29 DIAGNOSIS — M545 Low back pain: Principal | ICD-10-CM

## 2013-12-29 MED ORDER — HYDROCODONE-ACETAMINOPHEN 10-325 MG PO TABS: 1.0000 | ORAL_TABLET | Freq: Four times a day (QID) | ORAL | Status: DC | PRN

## 2014-01-05 ENCOUNTER — Encounter: Payer: Self-pay | Admitting: Obstetrics & Gynecology

## 2014-01-05 ENCOUNTER — Ambulatory Visit (INDEPENDENT_AMBULATORY_CARE_PROVIDER_SITE_OTHER): Payer: PRIVATE HEALTH INSURANCE | Admitting: Obstetrics & Gynecology

## 2014-01-05 VITALS — BP 146/61 | HR 74 | Ht 67.0 in | Wt 310.0 lb

## 2014-01-05 DIAGNOSIS — N3946 Mixed incontinence: Secondary | ICD-10-CM

## 2014-01-05 NOTE — Progress Notes (Signed)
   Subjective:    Patient ID: [name], female    DOB: Nov 20, 1945, 68 y.o.   MRN: [id number]  HPI  This wheelchair bound morbidly obese pleasant lady was referred to the gynecology office to address her urology issues. She has no gynecology complaints today and no longer needs pap smears.  Review of Systems     Objective:   Physical Exam        Assessment & Plan:  Mixed incontinence- I will refer her to a urologist.

## 2014-01-21 DIAGNOSIS — F329 Major depressive disorder, single episode, unspecified: Secondary | ICD-10-CM | POA: Diagnosis not present

## 2014-01-21 DIAGNOSIS — M5380 Other specified dorsopathies, site unspecified: Secondary | ICD-10-CM | POA: Diagnosis not present

## 2014-01-23 DIAGNOSIS — F329 Major depressive disorder, single episode, unspecified: Secondary | ICD-10-CM | POA: Diagnosis not present

## 2014-01-23 DIAGNOSIS — M5380 Other specified dorsopathies, site unspecified: Secondary | ICD-10-CM | POA: Diagnosis not present

## 2014-01-24 DIAGNOSIS — M5380 Other specified dorsopathies, site unspecified: Secondary | ICD-10-CM | POA: Diagnosis not present

## 2014-01-24 DIAGNOSIS — F329 Major depressive disorder, single episode, unspecified: Secondary | ICD-10-CM | POA: Diagnosis not present

## 2014-01-25 DIAGNOSIS — M5380 Other specified dorsopathies, site unspecified: Secondary | ICD-10-CM | POA: Diagnosis not present

## 2014-01-25 DIAGNOSIS — F329 Major depressive disorder, single episode, unspecified: Secondary | ICD-10-CM | POA: Diagnosis not present

## 2014-01-26 DIAGNOSIS — F329 Major depressive disorder, single episode, unspecified: Secondary | ICD-10-CM | POA: Diagnosis not present

## 2014-01-26 DIAGNOSIS — M5380 Other specified dorsopathies, site unspecified: Secondary | ICD-10-CM | POA: Diagnosis not present

## 2014-01-27 DIAGNOSIS — M5380 Other specified dorsopathies, site unspecified: Secondary | ICD-10-CM | POA: Diagnosis not present

## 2014-01-27 DIAGNOSIS — F329 Major depressive disorder, single episode, unspecified: Secondary | ICD-10-CM | POA: Diagnosis not present

## 2014-01-28 DIAGNOSIS — M5380 Other specified dorsopathies, site unspecified: Secondary | ICD-10-CM | POA: Diagnosis not present

## 2014-01-28 DIAGNOSIS — F329 Major depressive disorder, single episode, unspecified: Secondary | ICD-10-CM | POA: Diagnosis not present

## 2014-01-30 DIAGNOSIS — M5380 Other specified dorsopathies, site unspecified: Secondary | ICD-10-CM | POA: Diagnosis not present

## 2014-01-30 DIAGNOSIS — F329 Major depressive disorder, single episode, unspecified: Secondary | ICD-10-CM | POA: Diagnosis not present

## 2014-01-31 DIAGNOSIS — F329 Major depressive disorder, single episode, unspecified: Secondary | ICD-10-CM | POA: Diagnosis not present

## 2014-01-31 DIAGNOSIS — M5380 Other specified dorsopathies, site unspecified: Secondary | ICD-10-CM | POA: Diagnosis not present

## 2014-02-01 DIAGNOSIS — F329 Major depressive disorder, single episode, unspecified: Secondary | ICD-10-CM | POA: Diagnosis not present

## 2014-02-01 DIAGNOSIS — M5380 Other specified dorsopathies, site unspecified: Secondary | ICD-10-CM | POA: Diagnosis not present

## 2014-02-02 ENCOUNTER — Telehealth: Payer: Self-pay | Admitting: *Deleted

## 2014-02-02 DIAGNOSIS — M5380 Other specified dorsopathies, site unspecified: Secondary | ICD-10-CM | POA: Diagnosis not present

## 2014-02-02 DIAGNOSIS — F329 Major depressive disorder, single episode, unspecified: Secondary | ICD-10-CM | POA: Diagnosis not present

## 2014-02-02 NOTE — Telephone Encounter (Signed)
Pt has been informed.

## 2014-02-02 NOTE — Telephone Encounter (Signed)
I would be unable to prescribe something for her without seeing her. She does sound like she has a URI which is likely viral and I would suggest conservative measures such as steam inhalation, guaifenesin, saline nasal spray and encouraging PO hydration.

## 2014-02-02 NOTE — Telephone Encounter (Signed)
Pt called stating she has been feeling bad for over a week and wanted to know if you can suggest a medication for her symptoms.  She is c/o ear pain, sneezing, non-productive cough, nasal drainage, and feeling weaker than her normal. She feels miserable.  Denies fever and she is able to eat and drink okay. She states she can not take any OTC meds, her doctor needs to write a Rx. She had a scheduled appointment for tomorrow but has no transportation so cancelled. Pt # [id number]

## 2014-02-03 ENCOUNTER — Encounter: Payer: PRIVATE HEALTH INSURANCE | Admitting: Internal Medicine

## 2014-02-03 DIAGNOSIS — F329 Major depressive disorder, single episode, unspecified: Secondary | ICD-10-CM | POA: Diagnosis not present

## 2014-02-03 DIAGNOSIS — M5380 Other specified dorsopathies, site unspecified: Secondary | ICD-10-CM | POA: Diagnosis not present

## 2014-02-04 DIAGNOSIS — F329 Major depressive disorder, single episode, unspecified: Secondary | ICD-10-CM | POA: Diagnosis not present

## 2014-02-04 DIAGNOSIS — M5380 Other specified dorsopathies, site unspecified: Secondary | ICD-10-CM | POA: Diagnosis not present

## 2014-02-06 DIAGNOSIS — M5380 Other specified dorsopathies, site unspecified: Secondary | ICD-10-CM | POA: Diagnosis not present

## 2014-02-06 DIAGNOSIS — F329 Major depressive disorder, single episode, unspecified: Secondary | ICD-10-CM | POA: Diagnosis not present

## 2014-02-07 ENCOUNTER — Other Ambulatory Visit: Payer: Self-pay | Admitting: Urology

## 2014-02-07 DIAGNOSIS — N3946 Mixed incontinence: Secondary | ICD-10-CM | POA: Diagnosis not present

## 2014-02-07 DIAGNOSIS — R32 Unspecified urinary incontinence: Secondary | ICD-10-CM

## 2014-02-07 DIAGNOSIS — M5380 Other specified dorsopathies, site unspecified: Secondary | ICD-10-CM | POA: Diagnosis not present

## 2014-02-07 DIAGNOSIS — F329 Major depressive disorder, single episode, unspecified: Secondary | ICD-10-CM | POA: Diagnosis not present

## 2014-02-08 DIAGNOSIS — F329 Major depressive disorder, single episode, unspecified: Secondary | ICD-10-CM | POA: Diagnosis not present

## 2014-02-08 DIAGNOSIS — M5380 Other specified dorsopathies, site unspecified: Secondary | ICD-10-CM | POA: Diagnosis not present

## 2014-02-09 DIAGNOSIS — F329 Major depressive disorder, single episode, unspecified: Secondary | ICD-10-CM | POA: Diagnosis not present

## 2014-02-09 DIAGNOSIS — M5380 Other specified dorsopathies, site unspecified: Secondary | ICD-10-CM | POA: Diagnosis not present

## 2014-02-10 DIAGNOSIS — F329 Major depressive disorder, single episode, unspecified: Secondary | ICD-10-CM | POA: Diagnosis not present

## 2014-02-10 DIAGNOSIS — M5380 Other specified dorsopathies, site unspecified: Secondary | ICD-10-CM | POA: Diagnosis not present

## 2014-02-11 DIAGNOSIS — F329 Major depressive disorder, single episode, unspecified: Secondary | ICD-10-CM | POA: Diagnosis not present

## 2014-02-11 DIAGNOSIS — M5380 Other specified dorsopathies, site unspecified: Secondary | ICD-10-CM | POA: Diagnosis not present

## 2014-02-13 ENCOUNTER — Encounter: Payer: Self-pay | Admitting: *Deleted

## 2014-02-13 DIAGNOSIS — F329 Major depressive disorder, single episode, unspecified: Secondary | ICD-10-CM | POA: Diagnosis not present

## 2014-02-13 DIAGNOSIS — M5380 Other specified dorsopathies, site unspecified: Secondary | ICD-10-CM | POA: Diagnosis not present

## 2014-02-14 DIAGNOSIS — M5380 Other specified dorsopathies, site unspecified: Secondary | ICD-10-CM | POA: Diagnosis not present

## 2014-02-14 DIAGNOSIS — F329 Major depressive disorder, single episode, unspecified: Secondary | ICD-10-CM | POA: Diagnosis not present

## 2014-02-15 DIAGNOSIS — F329 Major depressive disorder, single episode, unspecified: Secondary | ICD-10-CM | POA: Diagnosis not present

## 2014-02-15 DIAGNOSIS — M5380 Other specified dorsopathies, site unspecified: Secondary | ICD-10-CM | POA: Diagnosis not present

## 2014-02-16 DIAGNOSIS — M5380 Other specified dorsopathies, site unspecified: Secondary | ICD-10-CM | POA: Diagnosis not present

## 2014-02-16 DIAGNOSIS — F329 Major depressive disorder, single episode, unspecified: Secondary | ICD-10-CM | POA: Diagnosis not present

## 2014-02-17 DIAGNOSIS — F329 Major depressive disorder, single episode, unspecified: Secondary | ICD-10-CM | POA: Diagnosis not present

## 2014-02-17 DIAGNOSIS — M5380 Other specified dorsopathies, site unspecified: Secondary | ICD-10-CM | POA: Diagnosis not present

## 2014-02-18 DIAGNOSIS — F329 Major depressive disorder, single episode, unspecified: Secondary | ICD-10-CM | POA: Diagnosis not present

## 2014-02-18 DIAGNOSIS — M5380 Other specified dorsopathies, site unspecified: Secondary | ICD-10-CM | POA: Diagnosis not present

## 2014-02-20 DIAGNOSIS — F329 Major depressive disorder, single episode, unspecified: Secondary | ICD-10-CM | POA: Diagnosis not present

## 2014-02-20 DIAGNOSIS — M5380 Other specified dorsopathies, site unspecified: Secondary | ICD-10-CM | POA: Diagnosis not present

## 2014-02-21 DIAGNOSIS — M5380 Other specified dorsopathies, site unspecified: Secondary | ICD-10-CM | POA: Diagnosis not present

## 2014-02-21 DIAGNOSIS — F329 Major depressive disorder, single episode, unspecified: Secondary | ICD-10-CM | POA: Diagnosis not present

## 2014-02-22 DIAGNOSIS — F329 Major depressive disorder, single episode, unspecified: Secondary | ICD-10-CM | POA: Diagnosis not present

## 2014-02-22 DIAGNOSIS — M5380 Other specified dorsopathies, site unspecified: Secondary | ICD-10-CM | POA: Diagnosis not present

## 2014-02-23 DIAGNOSIS — M5380 Other specified dorsopathies, site unspecified: Secondary | ICD-10-CM | POA: Diagnosis not present

## 2014-02-23 DIAGNOSIS — F329 Major depressive disorder, single episode, unspecified: Secondary | ICD-10-CM | POA: Diagnosis not present

## 2014-02-24 DIAGNOSIS — M5380 Other specified dorsopathies, site unspecified: Secondary | ICD-10-CM | POA: Diagnosis not present

## 2014-02-24 DIAGNOSIS — F329 Major depressive disorder, single episode, unspecified: Secondary | ICD-10-CM | POA: Diagnosis not present

## 2014-02-25 DIAGNOSIS — F329 Major depressive disorder, single episode, unspecified: Secondary | ICD-10-CM | POA: Diagnosis not present

## 2014-02-25 DIAGNOSIS — M5380 Other specified dorsopathies, site unspecified: Secondary | ICD-10-CM | POA: Diagnosis not present

## 2014-02-27 DIAGNOSIS — M5380 Other specified dorsopathies, site unspecified: Secondary | ICD-10-CM | POA: Diagnosis not present

## 2014-02-27 DIAGNOSIS — F329 Major depressive disorder, single episode, unspecified: Secondary | ICD-10-CM | POA: Diagnosis not present

## 2014-02-28 DIAGNOSIS — R32 Unspecified urinary incontinence: Secondary | ICD-10-CM | POA: Diagnosis not present

## 2014-02-28 DIAGNOSIS — M5380 Other specified dorsopathies, site unspecified: Secondary | ICD-10-CM | POA: Diagnosis not present

## 2014-02-28 DIAGNOSIS — F329 Major depressive disorder, single episode, unspecified: Secondary | ICD-10-CM | POA: Diagnosis not present

## 2014-03-01 ENCOUNTER — Encounter: Payer: Self-pay | Admitting: *Deleted

## 2014-03-01 DIAGNOSIS — F329 Major depressive disorder, single episode, unspecified: Secondary | ICD-10-CM | POA: Diagnosis not present

## 2014-03-01 DIAGNOSIS — M5380 Other specified dorsopathies, site unspecified: Secondary | ICD-10-CM | POA: Diagnosis not present

## 2014-03-02 DIAGNOSIS — M5380 Other specified dorsopathies, site unspecified: Secondary | ICD-10-CM | POA: Diagnosis not present

## 2014-03-02 DIAGNOSIS — N3946 Mixed incontinence: Secondary | ICD-10-CM | POA: Diagnosis not present

## 2014-03-02 DIAGNOSIS — F329 Major depressive disorder, single episode, unspecified: Secondary | ICD-10-CM | POA: Diagnosis not present

## 2014-03-03 DIAGNOSIS — F329 Major depressive disorder, single episode, unspecified: Secondary | ICD-10-CM | POA: Diagnosis not present

## 2014-03-03 DIAGNOSIS — M5380 Other specified dorsopathies, site unspecified: Secondary | ICD-10-CM | POA: Diagnosis not present

## 2014-03-04 DIAGNOSIS — F329 Major depressive disorder, single episode, unspecified: Secondary | ICD-10-CM | POA: Diagnosis not present

## 2014-03-04 DIAGNOSIS — M5380 Other specified dorsopathies, site unspecified: Secondary | ICD-10-CM | POA: Diagnosis not present

## 2014-03-06 DIAGNOSIS — F329 Major depressive disorder, single episode, unspecified: Secondary | ICD-10-CM | POA: Diagnosis not present

## 2014-03-06 DIAGNOSIS — M5380 Other specified dorsopathies, site unspecified: Secondary | ICD-10-CM | POA: Diagnosis not present

## 2014-03-07 DIAGNOSIS — M5380 Other specified dorsopathies, site unspecified: Secondary | ICD-10-CM | POA: Diagnosis not present

## 2014-03-07 DIAGNOSIS — F329 Major depressive disorder, single episode, unspecified: Secondary | ICD-10-CM | POA: Diagnosis not present

## 2014-03-08 DIAGNOSIS — F329 Major depressive disorder, single episode, unspecified: Secondary | ICD-10-CM | POA: Diagnosis not present

## 2014-03-08 DIAGNOSIS — M5380 Other specified dorsopathies, site unspecified: Secondary | ICD-10-CM | POA: Diagnosis not present

## 2014-03-09 DIAGNOSIS — M5380 Other specified dorsopathies, site unspecified: Secondary | ICD-10-CM | POA: Diagnosis not present

## 2014-03-09 DIAGNOSIS — F329 Major depressive disorder, single episode, unspecified: Secondary | ICD-10-CM | POA: Diagnosis not present

## 2014-03-10 DIAGNOSIS — M5380 Other specified dorsopathies, site unspecified: Secondary | ICD-10-CM | POA: Diagnosis not present

## 2014-03-10 DIAGNOSIS — F329 Major depressive disorder, single episode, unspecified: Secondary | ICD-10-CM | POA: Diagnosis not present

## 2014-03-11 DIAGNOSIS — F329 Major depressive disorder, single episode, unspecified: Secondary | ICD-10-CM | POA: Diagnosis not present

## 2014-03-11 DIAGNOSIS — M5380 Other specified dorsopathies, site unspecified: Secondary | ICD-10-CM | POA: Diagnosis not present

## 2014-03-13 ENCOUNTER — Inpatient Hospital Stay (HOSPITAL_COMMUNITY)
Admission: EM | Admit: 2014-03-13 | Discharge: 2014-04-06 | DRG: 196 | Disposition: A | Discharge status: Skilled Nursing Facility | Payer: Medicare Other | Attending: Oncology | Admitting: Oncology

## 2014-03-13 ENCOUNTER — Emergency Department (HOSPITAL_COMMUNITY): Payer: Medicare Other

## 2014-03-13 ENCOUNTER — Encounter (HOSPITAL_COMMUNITY): Payer: Self-pay | Admitting: Emergency Medicine

## 2014-03-13 DIAGNOSIS — G4733 Obstructive sleep apnea (adult) (pediatric): Secondary | ICD-10-CM | POA: Diagnosis present

## 2014-03-13 DIAGNOSIS — E876 Hypokalemia: Secondary | ICD-10-CM | POA: Diagnosis not present

## 2014-03-13 DIAGNOSIS — J90 Pleural effusion, not elsewhere classified: Secondary | ICD-10-CM | POA: Diagnosis not present

## 2014-03-13 DIAGNOSIS — I82402 Acute embolism and thrombosis of unspecified deep veins of left lower extremity: Secondary | ICD-10-CM | POA: Diagnosis present

## 2014-03-13 DIAGNOSIS — F419 Anxiety disorder, unspecified: Secondary | ICD-10-CM

## 2014-03-13 DIAGNOSIS — J84111 Idiopathic interstitial pneumonia, not otherwise specified: Principal | ICD-10-CM | POA: Diagnosis present

## 2014-03-13 DIAGNOSIS — Z86718 Personal history of other venous thrombosis and embolism: Secondary | ICD-10-CM | POA: Diagnosis not present

## 2014-03-13 DIAGNOSIS — J1189 Influenza due to unidentified influenza virus with other manifestations: Secondary | ICD-10-CM

## 2014-03-13 DIAGNOSIS — R0902 Hypoxemia: Secondary | ICD-10-CM | POA: Diagnosis not present

## 2014-03-13 DIAGNOSIS — E662 Morbid (severe) obesity with alveolar hypoventilation: Secondary | ICD-10-CM | POA: Diagnosis present

## 2014-03-13 DIAGNOSIS — Z86711 Personal history of pulmonary embolism: Secondary | ICD-10-CM

## 2014-03-13 DIAGNOSIS — E785 Hyperlipidemia, unspecified: Secondary | ICD-10-CM | POA: Diagnosis not present

## 2014-03-13 DIAGNOSIS — J9600 Acute respiratory failure, unspecified whether with hypoxia or hypercapnia: Secondary | ICD-10-CM | POA: Diagnosis not present

## 2014-03-13 DIAGNOSIS — F329 Major depressive disorder, single episode, unspecified: Secondary | ICD-10-CM | POA: Diagnosis not present

## 2014-03-13 DIAGNOSIS — J189 Pneumonia, unspecified organism: Secondary | ICD-10-CM | POA: Diagnosis not present

## 2014-03-13 DIAGNOSIS — Z9013 Acquired absence of bilateral breasts and nipples: Secondary | ICD-10-CM | POA: Diagnosis present

## 2014-03-13 DIAGNOSIS — K219 Gastro-esophageal reflux disease without esophagitis: Secondary | ICD-10-CM | POA: Diagnosis present

## 2014-03-13 DIAGNOSIS — Z9119 Patient's noncompliance with other medical treatment and regimen: Secondary | ICD-10-CM | POA: Diagnosis not present

## 2014-03-13 DIAGNOSIS — R2689 Other abnormalities of gait and mobility: Secondary | ICD-10-CM | POA: Diagnosis not present

## 2014-03-13 DIAGNOSIS — J9620 Acute and chronic respiratory failure, unspecified whether with hypoxia or hypercapnia: Secondary | ICD-10-CM | POA: Diagnosis not present

## 2014-03-13 DIAGNOSIS — F418 Other specified anxiety disorders: Secondary | ICD-10-CM | POA: Diagnosis present

## 2014-03-13 DIAGNOSIS — J9601 Acute respiratory failure with hypoxia: Secondary | ICD-10-CM | POA: Diagnosis not present

## 2014-03-13 DIAGNOSIS — J8489 Other specified interstitial pulmonary diseases: Secondary | ICD-10-CM | POA: Diagnosis not present

## 2014-03-13 DIAGNOSIS — R279 Unspecified lack of coordination: Secondary | ICD-10-CM | POA: Diagnosis not present

## 2014-03-13 DIAGNOSIS — Z85820 Personal history of malignant melanoma of skin: Secondary | ICD-10-CM | POA: Diagnosis not present

## 2014-03-13 DIAGNOSIS — Z853 Personal history of malignant neoplasm of breast: Secondary | ICD-10-CM | POA: Diagnosis not present

## 2014-03-13 DIAGNOSIS — N3010 Interstitial cystitis (chronic) without hematuria: Secondary | ICD-10-CM | POA: Diagnosis present

## 2014-03-13 DIAGNOSIS — L659 Nonscarring hair loss, unspecified: Secondary | ICD-10-CM | POA: Diagnosis not present

## 2014-03-13 DIAGNOSIS — M6281 Muscle weakness (generalized): Secondary | ICD-10-CM | POA: Diagnosis not present

## 2014-03-13 DIAGNOSIS — J9621 Acute and chronic respiratory failure with hypoxia: Secondary | ICD-10-CM | POA: Diagnosis not present

## 2014-03-13 DIAGNOSIS — J188 Other pneumonia, unspecified organism: Secondary | ICD-10-CM | POA: Diagnosis not present

## 2014-03-13 DIAGNOSIS — R0600 Dyspnea, unspecified: Secondary | ICD-10-CM | POA: Insufficient documentation

## 2014-03-13 DIAGNOSIS — R0602 Shortness of breath: Secondary | ICD-10-CM

## 2014-03-13 DIAGNOSIS — J679 Hypersensitivity pneumonitis due to unspecified organic dust: Secondary | ICD-10-CM

## 2014-03-13 DIAGNOSIS — D509 Iron deficiency anemia, unspecified: Secondary | ICD-10-CM | POA: Diagnosis not present

## 2014-03-13 DIAGNOSIS — F4310 Post-traumatic stress disorder, unspecified: Secondary | ICD-10-CM | POA: Diagnosis present

## 2014-03-13 DIAGNOSIS — E559 Vitamin D deficiency, unspecified: Secondary | ICD-10-CM | POA: Diagnosis present

## 2014-03-13 DIAGNOSIS — M797 Fibromyalgia: Secondary | ICD-10-CM | POA: Diagnosis present

## 2014-03-13 DIAGNOSIS — K589 Irritable bowel syndrome without diarrhea: Secondary | ICD-10-CM | POA: Diagnosis present

## 2014-03-13 DIAGNOSIS — A70 Chlamydia psittaci infections: Secondary | ICD-10-CM | POA: Diagnosis not present

## 2014-03-13 DIAGNOSIS — B9689 Other specified bacterial agents as the cause of diseases classified elsewhere: Secondary | ICD-10-CM

## 2014-03-13 DIAGNOSIS — K5900 Constipation, unspecified: Secondary | ICD-10-CM | POA: Diagnosis not present

## 2014-03-13 DIAGNOSIS — Z7983 Long term (current) use of bisphosphonates: Secondary | ICD-10-CM | POA: Diagnosis not present

## 2014-03-13 DIAGNOSIS — T486X5A Adverse effect of antiasthmatics, initial encounter: Secondary | ICD-10-CM | POA: Diagnosis present

## 2014-03-13 DIAGNOSIS — Z7901 Long term (current) use of anticoagulants: Secondary | ICD-10-CM

## 2014-03-13 DIAGNOSIS — G8929 Other chronic pain: Secondary | ICD-10-CM | POA: Diagnosis present

## 2014-03-13 DIAGNOSIS — I517 Cardiomegaly: Secondary | ICD-10-CM | POA: Diagnosis not present

## 2014-03-13 DIAGNOSIS — Z720 Tobacco use: Secondary | ICD-10-CM | POA: Diagnosis present

## 2014-03-13 DIAGNOSIS — Z66 Do not resuscitate: Secondary | ICD-10-CM | POA: Diagnosis present

## 2014-03-13 DIAGNOSIS — R05 Cough: Secondary | ICD-10-CM | POA: Diagnosis not present

## 2014-03-13 DIAGNOSIS — M810 Age-related osteoporosis without current pathological fracture: Secondary | ICD-10-CM

## 2014-03-13 DIAGNOSIS — R918 Other nonspecific abnormal finding of lung field: Secondary | ICD-10-CM | POA: Insufficient documentation

## 2014-03-13 DIAGNOSIS — D649 Anemia, unspecified: Secondary | ICD-10-CM | POA: Diagnosis not present

## 2014-03-13 DIAGNOSIS — J672 Bird fancier's lung: Secondary | ICD-10-CM | POA: Diagnosis present

## 2014-03-13 DIAGNOSIS — Z9981 Dependence on supplemental oxygen: Secondary | ICD-10-CM | POA: Diagnosis not present

## 2014-03-13 DIAGNOSIS — N3941 Urge incontinence: Secondary | ICD-10-CM | POA: Diagnosis present

## 2014-03-13 DIAGNOSIS — F17200 Nicotine dependence, unspecified, uncomplicated: Secondary | ICD-10-CM | POA: Diagnosis not present

## 2014-03-13 DIAGNOSIS — Z789 Other specified health status: Secondary | ICD-10-CM | POA: Diagnosis not present

## 2014-03-13 DIAGNOSIS — F3340 Major depressive disorder, recurrent, in remission, unspecified: Secondary | ICD-10-CM | POA: Diagnosis not present

## 2014-03-13 DIAGNOSIS — I272 Other secondary pulmonary hypertension: Secondary | ICD-10-CM | POA: Diagnosis not present

## 2014-03-13 DIAGNOSIS — K31819 Angiodysplasia of stomach and duodenum without bleeding: Secondary | ICD-10-CM | POA: Diagnosis not present

## 2014-03-13 DIAGNOSIS — J849 Interstitial pulmonary disease, unspecified: Secondary | ICD-10-CM

## 2014-03-13 DIAGNOSIS — R590 Localized enlarged lymph nodes: Secondary | ICD-10-CM | POA: Diagnosis not present

## 2014-03-13 DIAGNOSIS — J9690 Respiratory failure, unspecified, unspecified whether with hypoxia or hypercapnia: Secondary | ICD-10-CM

## 2014-03-13 DIAGNOSIS — R591 Generalized enlarged lymph nodes: Secondary | ICD-10-CM | POA: Diagnosis not present

## 2014-03-13 DIAGNOSIS — Z6841 Body Mass Index (BMI) 40.0 and over, adult: Secondary | ICD-10-CM | POA: Diagnosis not present

## 2014-03-13 DIAGNOSIS — N3946 Mixed incontinence: Secondary | ICD-10-CM | POA: Diagnosis present

## 2014-03-13 DIAGNOSIS — J8410 Pulmonary fibrosis, unspecified: Secondary | ICD-10-CM | POA: Diagnosis not present

## 2014-03-13 DIAGNOSIS — M5380 Other specified dorsopathies, site unspecified: Secondary | ICD-10-CM | POA: Diagnosis not present

## 2014-03-13 DIAGNOSIS — R509 Fever, unspecified: Secondary | ICD-10-CM | POA: Diagnosis not present

## 2014-03-13 DIAGNOSIS — F339 Major depressive disorder, recurrent, unspecified: Secondary | ICD-10-CM | POA: Diagnosis present

## 2014-03-13 DIAGNOSIS — F17210 Nicotine dependence, cigarettes, uncomplicated: Secondary | ICD-10-CM | POA: Diagnosis not present

## 2014-03-13 DIAGNOSIS — Z7401 Bed confinement status: Secondary | ICD-10-CM

## 2014-03-13 DIAGNOSIS — M545 Low back pain: Secondary | ICD-10-CM | POA: Diagnosis not present

## 2014-03-13 DIAGNOSIS — R161 Splenomegaly, not elsewhere classified: Secondary | ICD-10-CM | POA: Diagnosis not present

## 2014-03-13 DIAGNOSIS — I824Z2 Acute embolism and thrombosis of unspecified deep veins of left distal lower extremity: Secondary | ICD-10-CM | POA: Diagnosis not present

## 2014-03-13 LAB — COMPREHENSIVE METABOLIC PANEL
ALK PHOS: 87 U/L (ref 39–117)
ALT: 11 U/L (ref 0–35)
AST: 15 U/L (ref 0–37)
Albumin: 3.4 g/dL — ABNORMAL LOW (ref 3.5–5.2)
Anion gap: 8 (ref 5–15)
BILIRUBIN TOTAL: 0.4 mg/dL (ref 0.3–1.2)
BUN: 13 mg/dL (ref 6–23)
CO2: 29 mmol/L (ref 19–32)
CREATININE: 0.63 mg/dL (ref 0.50–1.10)
Calcium: 8.5 mg/dL (ref 8.4–10.5)
Chloride: 103 mmol/L (ref 96–112)
GFR calc Af Amer: >90 mL/min (ref >90)
GFR calc non Af Amer: 89 mL/min — ABNORMAL LOW (ref >90)
Glucose, Bld: 123 mg/dL — ABNORMAL HIGH (ref 70–99)
POTASSIUM: 3.7 mmol/L (ref 3.5–5.1)
Sodium: 140 mmol/L (ref 135–145)
Total Protein: 6.5 g/dL (ref 6.0–8.3)

## 2014-03-13 LAB — CBC WITH DIFFERENTIAL/PLATELET
BASOS ABS: 0 10*3/uL (ref 0.0–0.1)
BASOS PCT: 0 % (ref 0–1)
EOS ABS: 0.5 10*3/uL (ref 0.0–0.7)
EOS PCT: 4 % (ref 0–5)
HCT: 33.6 % — ABNORMAL LOW (ref 36.0–46.0)
Hemoglobin: 9.8 g/dL — ABNORMAL LOW (ref 12.0–15.0)
Lymphocytes Relative: 10 % — ABNORMAL LOW (ref 12–46)
Lymphs Abs: 1.2 10*3/uL (ref 0.7–4.0)
MCH: 23.4 pg — AB (ref 26.0–34.0)
MCHC: 29.2 g/dL — AB (ref 30.0–36.0)
MCV: 80.4 fL (ref 78.0–100.0)
Monocytes Absolute: 0.9 10*3/uL (ref 0.1–1.0)
Monocytes Relative: 7 % (ref 3–12)
Neutro Abs: 10.5 10*3/uL — ABNORMAL HIGH (ref 1.7–7.7)
Neutrophils Relative %: 79 % — ABNORMAL HIGH (ref 43–77)
Platelets: 252 10*3/uL (ref 150–400)
RBC: 4.18 MIL/uL (ref 3.87–5.11)
RDW: 18.5 % — AB (ref 11.5–15.5)
WBC: 13.1 10*3/uL — AB (ref 4.0–10.5)

## 2014-03-13 LAB — BRAIN NATRIURETIC PEPTIDE: B NATRIURETIC PEPTIDE 5: 142 pg/mL — AB (ref 0.0–100.0)

## 2014-03-13 LAB — POC OCCULT BLOOD, ED: FECAL OCCULT BLD: POSITIVE — AB

## 2014-03-13 LAB — TROPONIN I

## 2014-03-13 MED ORDER — FERROUS SULFATE 325 (65 FE) MG PO TABS
325.0000 mg | ORAL_TABLET | Freq: Three times a day (TID) | ORAL | Status: DC
  Administered 2014-03-14 (×3): 325 mg via ORAL
  Filled 2014-03-13 (×7): qty 1

## 2014-03-13 MED ORDER — ENOXAPARIN SODIUM 100 MG/ML ~~LOC~~ SOLN
100.0000 mg | Freq: Two times a day (BID) | SUBCUTANEOUS | Status: DC
  Administered 2014-03-14 – 2014-04-01 (×39): 100 mg via SUBCUTANEOUS
  Filled 2014-03-13 (×42): qty 1

## 2014-03-13 MED ORDER — ONDANSETRON HCL 4 MG/2ML IJ SOLN
4.0000 mg | Freq: Once | INTRAMUSCULAR | Status: AC
  Administered 2014-03-13: 4 mg via INTRAVENOUS
  Filled 2014-03-13: qty 2

## 2014-03-13 MED ORDER — IPRATROPIUM-ALBUTEROL 0.5-2.5 (3) MG/3ML IN SOLN: 3.0000 mL | RESPIRATORY_TRACT | Status: DC

## 2014-03-13 MED ORDER — LORAZEPAM 0.5 MG PO TABS
0.5000 mg | ORAL_TABLET | Freq: Every day | ORAL | Status: DC
  Administered 2014-03-14: 0.5 mg via ORAL
  Filled 2014-03-13: qty 1

## 2014-03-13 MED ORDER — SODIUM CHLORIDE 0.9 % IJ SOLN
3.0000 mL | Freq: Two times a day (BID) | INTRAMUSCULAR | Status: DC
  Administered 2014-03-14 – 2014-04-06 (×44): 3 mL via INTRAVENOUS

## 2014-03-13 MED ORDER — IPRATROPIUM-ALBUTEROL 0.5-2.5 (3) MG/3ML IN SOLN
3.0000 mL | Freq: Four times a day (QID) | RESPIRATORY_TRACT | Status: DC | PRN
  Administered 2014-03-15: 3 mL via RESPIRATORY_TRACT
  Filled 2014-03-13: qty 3

## 2014-03-13 MED ORDER — LAMOTRIGINE 200 MG PO TABS
200.0000 mg | ORAL_TABLET | Freq: Every morning | ORAL | Status: DC
  Administered 2014-03-14 – 2014-04-06 (×24): 200 mg via ORAL
  Filled 2014-03-13 (×24): qty 1

## 2014-03-13 MED ORDER — PANTOPRAZOLE SODIUM 40 MG PO TBEC
40.0000 mg | DELAYED_RELEASE_TABLET | Freq: Every day | ORAL | Status: DC
  Administered 2014-03-14 – 2014-04-01 (×19): 40 mg via ORAL
  Filled 2014-03-13 (×19): qty 1

## 2014-03-13 MED ORDER — IPRATROPIUM BROMIDE 0.02 % IN SOLN
0.5000 mg | Freq: Once | RESPIRATORY_TRACT | Status: AC
  Administered 2014-03-13: 0.5 mg via RESPIRATORY_TRACT
  Filled 2014-03-13: qty 2.5

## 2014-03-13 MED ORDER — CEFTRIAXONE SODIUM IN DEXTROSE 20 MG/ML IV SOLN
1.0000 g | INTRAVENOUS | Status: DC
  Filled 2014-03-13: qty 50

## 2014-03-13 MED ORDER — PRAVASTATIN SODIUM 80 MG PO TABS
80.0000 mg | ORAL_TABLET | Freq: Every day | ORAL | Status: DC
  Administered 2014-03-14 – 2014-03-31 (×18): 80 mg via ORAL
  Filled 2014-03-13 (×18): qty 1

## 2014-03-13 MED ORDER — QUETIAPINE FUMARATE 100 MG PO TABS
400.0000 mg | ORAL_TABLET | Freq: Every day | ORAL | Status: DC
  Administered 2014-03-13 – 2014-04-05 (×24): 400 mg via ORAL
  Filled 2014-03-13: qty 4
  Filled 2014-03-13 (×20): qty 1
  Filled 2014-03-13: qty 4
  Filled 2014-03-13 (×2): qty 1

## 2014-03-13 MED ORDER — ALBUTEROL SULFATE (2.5 MG/3ML) 0.083% IN NEBU
5.0000 mg | INHALATION_SOLUTION | Freq: Once | RESPIRATORY_TRACT | Status: AC
  Administered 2014-03-13: 5 mg via RESPIRATORY_TRACT
  Filled 2014-03-13: qty 6

## 2014-03-13 MED ORDER — IPRATROPIUM-ALBUTEROL 0.5-2.5 (3) MG/3ML IN SOLN: 3.0000 mL | Freq: Three times a day (TID) | RESPIRATORY_TRACT | Status: DC

## 2014-03-13 MED ORDER — VITAMIN C 500 MG PO TABS
500.0000 mg | ORAL_TABLET | Freq: Every day | ORAL | Status: DC
  Administered 2014-03-14 – 2014-04-06 (×24): 500 mg via ORAL
  Filled 2014-03-13 (×24): qty 1

## 2014-03-13 MED ORDER — SERTRALINE HCL 100 MG PO TABS
200.0000 mg | ORAL_TABLET | Freq: Every morning | ORAL | Status: DC
  Administered 2014-03-14 – 2014-03-31 (×18): 200 mg via ORAL
  Filled 2014-03-13 (×18): qty 2

## 2014-03-13 MED ORDER — GUAIFENESIN-DM 100-10 MG/5ML PO SYRP
5.0000 mL | ORAL_SOLUTION | Freq: Four times a day (QID) | ORAL | Status: DC | PRN
  Administered 2014-03-19 (×2): 5 mL via ORAL
  Filled 2014-03-13 (×3): qty 5

## 2014-03-13 MED ORDER — ALENDRONATE SODIUM 70 MG PO TABS: 70.0000 mg | ORAL_TABLET | ORAL | Status: DC

## 2014-03-13 MED ORDER — DEXTROSE 5 % IV SOLN
500.0000 mg | INTRAVENOUS | Status: DC
  Administered 2014-03-14 – 2014-03-16 (×4): 500 mg via INTRAVENOUS
  Filled 2014-03-13 (×5): qty 500

## 2014-03-13 MED ORDER — HYDROCODONE-ACETAMINOPHEN 10-325 MG PO TABS: 1.0000 | ORAL_TABLET | Freq: Four times a day (QID) | ORAL | Status: DC | PRN

## 2014-03-13 MED ORDER — CEFTRIAXONE SODIUM IN DEXTROSE 40 MG/ML IV SOLN
2.0000 g | INTRAVENOUS | Status: DC
  Administered 2014-03-13 – 2014-03-18 (×6): 2 g via INTRAVENOUS
  Filled 2014-03-13 (×7): qty 50

## 2014-03-13 MED ORDER — LEVOFLOXACIN IN D5W 500 MG/100ML IV SOLN
500.0000 mg | Freq: Once | INTRAVENOUS | Status: AC
  Administered 2014-03-13: 500 mg via INTRAVENOUS
  Filled 2014-03-13: qty 100

## 2014-03-13 MED ORDER — NICOTINE 14 MG/24HR TD PT24
14.0000 mg | MEDICATED_PATCH | Freq: Every day | TRANSDERMAL | Status: DC
  Administered 2014-03-14 – 2014-04-05 (×24): 14 mg via TRANSDERMAL
  Filled 2014-03-13 (×25): qty 1

## 2014-03-13 MED ORDER — IPRATROPIUM-ALBUTEROL 0.5-2.5 (3) MG/3ML IN SOLN: 3.0000 mL | Freq: Once | RESPIRATORY_TRACT | Status: DC

## 2014-03-13 MED ORDER — GABAPENTIN 100 MG PO CAPS
100.0000 mg | ORAL_CAPSULE | Freq: Three times a day (TID) | ORAL | Status: DC
  Administered 2014-03-13 – 2014-04-06 (×72): 100 mg via ORAL
  Filled 2014-03-13 (×72): qty 1

## 2014-03-13 NOTE — ED Notes (Signed)
[facility] was notified of pt's transfer to [facility]. 

## 2014-03-13 NOTE — ED Notes (Signed)
[facility] here to transport pt to [facility]. 

## 2014-03-13 NOTE — ED Provider Notes (Signed)
CSN: [id number]     Arrival date & time 03/13/14  1446 History   First MD Initiated Contact with Patient 03/13/14 1521     Chief Complaint  Patient presents with  . Cough  . Fever     (Consider location/radiation/quality/duration/timing/severity/associated sxs/prior Treatment) Patient is a 69 y.o. female presenting with cough and fever. The history is provided by the patient.  Cough Cough characteristics:  Productive Associated symptoms: chest pain, fever and shortness of breath   Associated symptoms: no headaches and no rash   Fever Associated symptoms: chest pain, cough, dysuria, nausea and vomiting   Associated symptoms: no diarrhea, no headaches and no rash    patient's had a cough over the last month. States sputum went from light to yellow to brown. Has had some chills without fevers. Has had vomiting over the last few days. States she is on some chronic medicines for her dysuria. States she is on antibiotics. States she is scheduled to have a test for that. States she has been on oxygen on the past but is not now. No sick contacts. She has pain in her right chest and shoulder area also. Diffuse abdominal pain.  Past Medical History  Diagnosis Date  . Chronic low back pain 11/05/2005    s/p lumbar stenosis decompression surgery with residual pain  . Arthritis of left knee 01/30/2012  . Fibromyalgia 01/30/2012  . 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  On life-long anticoagulation.   . Chronic venous insufficiency 01/30/2012    Left lower extremity s/p DVT   . Morbid obesity with BMI of 40.0-44.9, adult 01/02/2011  . Gastroesophageal reflux disease 01/30/2012  . 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 Benson Norway  . Major depression 11/05/2005  . Anxiety 01/30/2012  . Hyperlipidemia LDL goal < 130 01/30/2012  . Urge urinary incontinence 03/25/2008    Urge predominant, minor component or stress.   Nocturnal.   . Obstructive sleep apnea 11/05/2005    Non-compliant with nocturnal nasal CPAP secondary to it being "ineffective"   . Tobacco abuse 11/05/2005  . Irritable bowel syndrome   . Interstitial cystitis     followed by Dr. Risa Grill  . Left cataract     Surgery pending  . Bell's palsy 1980's  . History of blood transfusion     During GAVE associated GI bleed  . Complication of anesthesia     Hard time putting patient to sleep  . Family history of anesthesia complication     "Mama couldn't wake up easy"  . Asymptomatic cholelithiasis 07/15/2013  . 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 Breast cancer     Bilateral s/p mastectomies   Past Surgical History  Procedure Laterality Date  . Cervical discectomy  11/1995 and 05/2002    by Dr. Hal Neer  . Abdominal hysterectomy  1976  . Bladder suspension  1993  . Orif ankle fracture  1993  . Lumbar laminectomy/decompression microdiscectomy  02/2007    by Dr. Hal Neer  . Carpal tunnel release Left   . Tonsillectomy and adenoidectomy  1953  . Appendectomy    . Dilation and curettage of uterus    . Tubal ligation    . Carpectomy  07/23/2011    Procedure: CARPECTOMY;  Surgeon: Linna Hoff, MD;  Location: [place];  Service: Orthopedics;  Laterality: Left;  LEFT WRIST PROXIMAL ROW CARPECTOMY  .  Esophagogastroduodenoscopy  12/25/2011    Procedure: ESOPHAGOGASTRODUODENOSCOPY (EGD);  Surgeon: Beryle Beams, MD;  Location: [facility] ENDOSCOPY;  Service: Endoscopy;  Laterality: N/A;  . Breast surgery  1992    Bilateral mastectomies for breast cancer  . Rhinoplasty  1993  . Spine surgery    . Hot hemostasis  02/06/2012    Procedure: HOT HEMOSTASIS (ARGON PLASMA COAGULATION/BICAP);  Surgeon: Beryle Beams, MD;  Location: [facility];  Service: Endoscopy;  Laterality: N/A;  . Esophagogastroduodenoscopy  02/06/2012    Procedure: ESOPHAGOGASTRODUODENOSCOPY (EGD);  Surgeon: Beryle Beams, MD;  Location: [facility];  Service: Endoscopy;   Laterality: N/A;  . Esophagogastroduodenoscopy N/A 04/02/2012    Procedure: ESOPHAGOGASTRODUODENOSCOPY (EGD);  Surgeon: Beryle Beams, MD;  Location: [facility];  Service: Endoscopy;  Laterality: N/A;  EGD w/ APC  . Back surgery      x 3  . Tendon transfer Left 06/24/2012    "thumb" (06/24/2012)  . Mastectomy Bilateral   . Melanoma excision Left     "side of my nose" (06/24/2012)  . Tendon transfer Left 06/24/2012    Procedure: LEFT THUMB EIP TO EPL TENDON TRANSFER;  Surgeon: Linna Hoff, MD;  Location: [place];  Service: Orthopedics;  Laterality: Left;   Family History  Problem Relation Age of Onset  . Heart disease Mother   . Deep vein thrombosis Mother   . Cancer - Lung Brother   . Alcohol abuse Father   . Schizophrenia Father   . Anxiety disorder Son   . Healthy Brother   . Healthy Brother    History  Substance Use Topics  . Smoking status: Current Every Day Smoker -- 0.50 packs/day for 40 years    Types: Cigarettes  . Smokeless tobacco: Never Used     Comment: Trying to cut down.  . Alcohol Use: No   OB History    Gravida Para Term Preterm AB TAB SAB Ectopic Multiple Living   1 1 1       1      Review of Systems  Constitutional: Positive for fever. Negative for activity change and appetite change.  HENT: Negative for facial swelling.   Eyes: Negative for pain.  Respiratory: Positive for cough and shortness of breath. Negative for chest tightness.   Cardiovascular: Positive for chest pain and leg swelling.  Gastrointestinal: Positive for nausea, vomiting and abdominal pain. Negative for diarrhea.  Genitourinary: Positive for dysuria. Negative for flank pain.  Musculoskeletal: Negative for back pain and neck stiffness.  Skin: Negative for rash.  Neurological: Negative for weakness, numbness and headaches.  Psychiatric/Behavioral: Negative for behavioral problems.      Allergies  Adhesive; Codeine; Morphine; Sulfonamide derivatives; and Thorazine  Home Medications    Prior to Admission medications   Medication Sig Start Date End Date Taking? Authorizing Provider  albuterol (PROVENTIL) (2.5 MG/3ML) 0.083% nebulizer solution Take 3 mLs (2.5 mg total) by nebulization every 6 (six) hours as needed for shortness of breath. 11/09/13  Yes Karren Cobble, MD  alendronate (FOSAMAX) 70 MG tablet Take 1 tablet (70 mg total) by mouth every 7 (seven) days. Take with a full glass of water on an empty stomach. 10/24/13  Yes Karren Cobble, MD  calcium citrate-vitamin D (CITRACAL+D) 315-200 MG-UNIT per tablet Take 2 tablets by mouth 2 (two) times daily. 11/29/12  Yes Karren Cobble, MD  enoxaparin (LOVENOX) 100 MG/ML injection Inject 1 mL (100 mg total) into the skin 2 (two) times daily. 10/21/13  Yes  Karren Cobble, MD  ferrous sulfate 325 (65 FE) MG EC tablet Take 325 mg by mouth 3 (three) times daily with meals. 03/23/12  Yes Karren Cobble, MD  gabapentin (NEURONTIN) 100 MG capsule Take 100 mg by mouth 3 (three) times daily.    Yes Historical Provider, MD  lamoTRIgine (LAMICTAL) 200 MG tablet Take 200 mg by mouth every morning.   Yes Historical Provider, MD  LORazepam (ATIVAN) 1 MG tablet Take 1-2 mg by mouth 2 (two) times daily. Takes 1 tablet in morning and 2 tablets at bedtime   Yes Historical Provider, MD  nitrofurantoin, macrocrystal-monohydrate, (MACROBID) 100 MG capsule Take 100 mg by mouth 2 (two) times daily.   Yes Historical Provider, MD  pantoprazole (PROTONIX) 40 MG tablet Take 1 tablet (40 mg total) by mouth daily. 02/16/13  Yes Karren Cobble, MD  pravastatin (PRAVACHOL) 80 MG tablet Take 1 tablet (80 mg total) by mouth daily.   Yes Karren Cobble, MD  QUEtiapine (SEROQUEL) 200 MG tablet Take 400 mg by mouth at bedtime.    Yes Historical Provider, MD  sertraline (ZOLOFT) 100 MG tablet Take 200 mg by mouth every morning.    Yes Historical Provider, MD  vitamin C (ASCORBIC ACID) 500 MG tablet Take 1 tablet (500 mg total) by mouth daily. 06/24/12  Yes  Linna Hoff, MD  HYDROcodone-acetaminophen [facility]) 10-325 MG per tablet Take 1-2 tablets by mouth every 6 (six) hours as needed for moderate pain. Patient not taking: Reported on 03/13/2014 12/29/13   Karren Cobble, MD  Potassium Chloride ER 20 MEQ TBCR Take 80 mEq by mouth daily. 01/25/13 06/22/13  Francesca Oman, DO   BP 131/54 mmHg  Pulse 83  Temp(Src) 99.2 F (37.3 C) (Oral)  Resp 20  Ht 5\' 7"  (1.702 m)  Wt 324 lb 8.3 oz (147.2 kg)  BMI 50.81 kg/m2  SpO2 94% Physical Exam  Constitutional: She appears well-developed.  Patient is in a wheelchair baseline  HENT:  Head: Normocephalic.  Eyes: Pupils are equal, round, and reactive to light.  Neck: No JVD present.  Cardiovascular: Normal rate.   Pulmonary/Chest:  Patient has normal sounding breaths until up with the stethoscope on her chest and then she began to have some upper airway"wheezing". It also resolved when I stop listening to her chest. Does have some harsh breath sounds throughout, and prolonged expirations  Abdominal: There is tenderness.  Patient is obese. Diffuse abdominal tenderness.  Musculoskeletal: She exhibits edema.  Some bilateral lower extremity pitting edema.  Skin: Skin is warm.  Nursing note and vitals reviewed.   ED Course  Procedures (including critical care time) Labs Review Labs Reviewed  CBC WITH DIFFERENTIAL/PLATELET - Abnormal; Notable for the following:    WBC 13.1 (*)    Hemoglobin 9.8 (*)    HCT 33.6 (*)    MCH 23.4 (*)    MCHC 29.2 (*)    RDW 18.5 (*)    Neutrophils Relative % 79 (*)    Neutro Abs 10.5 (*)    Lymphocytes Relative 10 (*)    All other components within normal limits  COMPREHENSIVE METABOLIC PANEL - Abnormal; Notable for the following:    Glucose, Bld 123 (*)    Albumin 3.4 (*)    GFR calc non Af Amer 89 (*)    All other components within normal limits  BRAIN NATRIURETIC PEPTIDE - Abnormal; Notable for the following:    B Natriuretic Peptide 142.0 (*)  All other  components within normal limits  POC OCCULT BLOOD, ED - Abnormal; Notable for the following:    Fecal Occult Bld POSITIVE (*)    All other components within normal limits  CULTURE, BLOOD (ROUTINE X 2)  CULTURE, BLOOD (ROUTINE X 2)  CULTURE, EXPECTORATED SPUTUM-ASSESSMENT  GRAM STAIN  TROPONIN I  LEGIONELLA ANTIGEN, URINE  STREP PNEUMONIAE URINARY ANTIGEN  CBC WITH DIFFERENTIAL/PLATELET  INFLUENZA PANEL BY PCR (TYPE A & B, H1N1)  IRON AND TIBC  FERRITIN  HIV ANTIBODY (ROUTINE TESTING)  TYPE AND SCREEN    Imaging Review Dg Chest 2 View  03/13/2014   CLINICAL DATA:  69 year old female with productive cough and fever for 3 days. Weakness and severe shortness of Breath. Initial encounter.  EXAM: CHEST  2 VIEW  COMPARISON:  Chest CT 02/01/2013 and earlier.  FINDINGS: Large body habitus.  Semi upright AP and lateral views of the chest.  Changes of peripheral pulmonary fibrosis demonstrated by CT in 2015.  Since that time, progressive bilateral pulmonary opacity, mostly interstitial but confluent at the left hilum. Possible small pleural effusions. Stable cardiomegaly and mediastinal contours. No pneumothorax. Partially visible cervical ACDF hardware. No acute osseous abnormality identified.  IMPRESSION: Increased bilateral pulmonary opacity since 2015 favored to reflect acute pneumonia superimposed on chronic pulmonary fibrosis. Possible small pleural effusions.   Electronically Signed   By: Genevie Ann M.D.   On: 03/13/2014 17:09     EKG Interpretation None      MDM   Final diagnoses:  CAP (community acquired pneumonia)  Anemia, unspecified anemia type  Hypoxia    Patient with possible pneumonia. Has had cough for a while. Some hypoxia. PCP is at [facility] and will transfer over there. Levaquin started.    Jasper Riling. Alvino Chapel, MD 03/14/14 [phone]

## 2014-03-13 NOTE — Consult Note (Signed)
PHARMACY NOTE  CONSULT :  Ceftriaxone, Azithromycin INDICATION :  CAP  Patient Weight:  147 kg  ASSESSMENT:  Pharmacy consulted for Pharmacy to adjust antibiotics for renal function as required.     Currently ordered Ceftriaxone and Azithromycin for CAP  .  Dosing Weight  147 kg,  SCr 0.63,  estimated CrCl  100 ml/min  PLAN:  1. Continue Azithromycin 500 mg IV q 24 hours as previously ordered. 2. Change  Ceftriaxone to 2 gm IV q 24 hours.  3. Recommend Monitoring WBC's, fever curve, any cultures/sensitivities, and clinical progression. 4. Pharmacy will Sign Off and follow peripherally given no adjustments in doses or schedules are anticipated.  There are alerts to indicate dramatic changes in renal function or clinical condition that might require dose or schedule adjustments.  Please re-consult if additional assistance is needed. Thank you for allowing Pharmacy to participate in this patient's care   Estelle June,  Pharm.D. ,  03/13/2014,  10:27 PM

## 2014-03-13 NOTE — ED Notes (Signed)
Pt coming from home, c/o fever and productive cough  x3 days.

## 2014-03-13 NOTE — ED Notes (Signed)
Verified with EDP, Dr. Alvino Chapel, that blood culture x 2 not needed--- okay to administer IV antibiotic at this time.

## 2014-03-13 NOTE — ED Notes (Signed)
Bed: [facility] Expected date:  Expected time:  Means of arrival:  Comments: Ems- fever, wheelchair bound

## 2014-03-13 NOTE — H&P (Signed)
Date: 03/13/2014               Patient Name:  [name] MRN: [id number]  DOB: 1946-01-02 Age / Sex: 69 y.o., female   PCP: Karren Cobble, MD         Medical Service: Internal Medicine Teaching Service         Attending Physician: Dr. Karren Cobble, MD    First Contact: Dr. Trudee Kuster Pager: [phone]  Second Contact: Dr. 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  She endorses coughing, sneezing, wheezing, and SOB for last 2 weeks with worsening symptoms and today she was scared and decided to come to the ED. Her sputum was initially white but then now turned brown. No CP, but has chills and thinks she has had fevers as well. She has run out of her nebulizer treatments but usually uses it twice ine one week. She denies any sick contact or travel. She has received the flu vaccine.   She was on o2 at home 2 years ago but not recently. Doesn't use CPAP because she finds it uncomfortable.  Saw Dr. Gaynelle Arabian for mixed urinary incontinence last week and is supposed to get urodynamic studies done. Was put on macrobid for 10 days (likely starting on Wed) then is to transition to 1 tablet daily for 30 days. We cannot find the results. Denies any dysuria but she states she doesn't really feel when she has to urinate. Denies any bleeding or hemoptysis/hematemesis, but says her aid has noticed that her stool has gotten darker brown over the past few days. She is non-compliant with her iron supplements and continues to smoke  1/2 ppd.   Got duoneb, levaquin 1x, and zofran in ED. Feels better after the inhaler treatment.   Meds: Current Facility-Administered Medications  Medication Dose Route Frequency Provider Last Rate Last Dose  . azithromycin (ZITHROMAX) 500 mg in dextrose 5 % 250 mL IVPB  500 mg Intravenous Q24H Sumner Boesch, MD      . cefTRIAXone (ROCEPHIN) 1 g in dextrose 5 % 50 mL IVPB - Premix  1 g Intravenous Q24H Gurkirat Basher, MD      . enoxaparin (LOVENOX) injection 100 mg  100 mg Subcutaneous BID Luke Falero, MD      . ferrous sulfate EC tablet 325 mg  325 mg Oral TID WC Xavi Tomasik, MD      . gabapentin (NEURONTIN) capsule 100 mg  100 mg Oral TID Jeaneen Cala, MD      . guaiFENesin-dextromethorphan (ROBITUSSIN DM) 100-10 MG/5ML syrup 5 mL  5 mL Oral Q6H PRN Tonya Carlile, MD      . HYDROcodone-acetaminophen (NORCO) 10-325 MG per tablet 1-2 tablet  1-2 tablet Oral Q6H PRN Inri Sobieski, MD      . Derrill Memo ON 03/14/2014] ipratropium-albuterol (DUONEB) 0.5-2.5 (3) MG/3ML nebulizer solution 3 mL  3 mL Nebulization Q4H Nelli Swalley, MD      . Derrill Memo ON 03/14/2014] lamoTRIgine (LAMICTAL) tablet 200 mg  200 mg Oral q morning -  10a Stewart Sasaki, MD      . LORazepam (ATIVAN) tablet 0.5 mg  0.5 mg Oral QHS Tullio Chausse, MD      . nicotine (NICODERM CQ - dosed in mg/24 hours) patch 14 mg  14 mg Transdermal Daily Ambika Zettlemoyer, MD      . Derrill Memo ON 03/14/2014] pantoprazole (PROTONIX) EC tablet 40 mg  40 mg Oral Daily Lajuan Godbee, MD      . Derrill Memo ON 03/14/2014] pravastatin (PRAVACHOL) tablet 80 mg  80 mg Oral Daily Azjah Pardo, MD      . QUEtiapine (SEROQUEL) tablet 400 mg  400 mg Oral QHS Keilany Burnette, MD      . Derrill Memo ON 03/14/2014] sertraline (ZOLOFT) tablet 200 mg  200 mg Oral q morning - 10a Chandrea Zellman, MD      . sodium chloride 0.9 % injection 3 mL  3 mL Intravenous Q12H Clemma Johnsen, MD      . Derrill Memo ON 03/14/2014] vitamin C (ASCORBIC ACID) tablet 500 mg  500 mg Oral Daily Jhana Giarratano, MD        Allergies: Allergies as of 03/13/2014 - Review Complete 03/13/2014  Allergen Reaction Noted  . Adhesive [tape] Other (See Comments) 07/03/2011  . Codeine Other (See Comments)   . Morphine Other (See Comments) 12/13/2007  . Sulfonamide derivatives Nausea And Vomiting 12/26/2005  . Thorazine [chlorpromazine] Nausea Only 09/22/2012   Past Medical History  Diagnosis Date  . Chronic low back pain 11/05/2005    s/p lumbar stenosis decompression surgery with residual pain  . Arthritis of left knee 01/30/2012  . Fibromyalgia 01/30/2012  . 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  On life-long anticoagulation.   . Chronic venous insufficiency 01/30/2012    Left lower extremity s/p DVT   . Morbid obesity with BMI of 40.0-44.9, adult 01/02/2011  . Gastroesophageal reflux disease 01/30/2012  . 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 Benson Norway  . Major depression 11/05/2005  . Anxiety 01/30/2012  . Hyperlipidemia LDL goal < 130 01/30/2012  . Urge urinary incontinence 03/25/2008    Urge predominant, minor component or stress.  Nocturnal.   . Obstructive sleep apnea 11/05/2005    Non-compliant with nocturnal nasal CPAP secondary to it being "ineffective"   . Tobacco abuse 11/05/2005  . Irritable bowel syndrome   . Interstitial cystitis     followed by Dr. Risa Grill  . Left cataract     Surgery pending  . Bell's palsy 1980's  . History of blood transfusion     During GAVE associated GI bleed  . Complication of anesthesia     Hard time putting patient to sleep  . Family history of anesthesia complication     "Mama couldn't wake up easy"  . Asymptomatic cholelithiasis 07/15/2013  . 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 Breast cancer     Bilateral s/p mastectomies   Past Surgical History  Procedure Laterality Date  . Cervical discectomy  11/1995 and 05/2002    by Dr. Hal Neer  . Abdominal  hysterectomy  1976  . Bladder suspension  1993  . Orif ankle fracture  1993  . Lumbar laminectomy/decompression microdiscectomy  02/2007    by Dr. Hal Neer  . Carpal tunnel release Left   . Tonsillectomy and adenoidectomy  1953  . Appendectomy    . Dilation and curettage of uterus    .  Tubal ligation    . Carpectomy  07/23/2011    Procedure: CARPECTOMY;  Surgeon: Linna Hoff, MD;  Location: [facility];  Service: Orthopedics;  Laterality: Left;  LEFT WRIST PROXIMAL ROW CARPECTOMY  . Esophagogastroduodenoscopy  12/25/2011    Procedure: ESOPHAGOGASTRODUODENOSCOPY (EGD);  Surgeon: Beryle Beams, MD;  Location: [facility] ENDOSCOPY;  Service: Endoscopy;  Laterality: N/A;  . Breast surgery  1992    Bilateral mastectomies for breast cancer  . Rhinoplasty  1993  . Spine surgery    . Hot hemostasis  02/06/2012    Procedure: HOT HEMOSTASIS (ARGON PLASMA COAGULATION/BICAP);  Surgeon: Beryle Beams, MD;  Location: [facility] ENDOSCOPY;  Service: Endoscopy;  Laterality: N/A;  . Esophagogastroduodenoscopy  02/06/2012    Procedure: ESOPHAGOGASTRODUODENOSCOPY (EGD);  Surgeon: Beryle Beams, MD;  Location: [facility] ENDOSCOPY;  Service: Endoscopy;  Laterality: N/A;  . Esophagogastroduodenoscopy N/A 04/02/2012    Procedure: ESOPHAGOGASTRODUODENOSCOPY (EGD);  Surgeon: Beryle Beams, MD;  Location: [facility] ENDOSCOPY;  Service: Endoscopy;  Laterality: N/A;  EGD w/ APC  . Back surgery      x 3  . Tendon transfer Left 06/24/2012    "thumb" (06/24/2012)  . Mastectomy Bilateral   . Melanoma excision Left     "side of my nose" (06/24/2012)  . Tendon transfer Left 06/24/2012    Procedure: LEFT THUMB EIP TO EPL TENDON TRANSFER;  Surgeon: Linna Hoff, MD;  Location: [facility];  Service: Orthopedics;  Laterality: Left;   Family History  Problem Relation Age of Onset  . Heart disease Mother   . Deep vein thrombosis Mother   . Cancer - Lung Brother   . Alcohol abuse Father   . Schizophrenia Father   . Anxiety disorder Son   . Healthy Brother   .  Healthy Brother    History   Social History  . Marital Status: Single    Spouse Name: N/A  . Number of Children: 1  . Years of Education: N/A   Occupational History  . Not on file.   Social History Main Topics  . Smoking status: Current Every Day Smoker -- 0.50 packs/day for 40 years    Types: Cigarettes  . Smokeless tobacco: Never Used     Comment: Trying to cut down.  . Alcohol Use: No  . Drug Use: No  . Sexual Activity: Not on file     Comment: QUIT ONE MONTH AGO   Other Topics Concern  . Not on file   Social History Narrative   Review of Systems: Review of Systems  Constitutional: Positive for chills, malaise/fatigue and diaphoresis. Negative for fever.  HENT: Positive for congestion and sore throat. Negative for nosebleeds.   Eyes: Negative.   Respiratory: Positive for cough, sputum production, shortness of breath and wheezing. Negative for hemoptysis and stridor.   Cardiovascular: Negative for chest pain, palpitations, leg swelling and PND.  Gastrointestinal: Negative for heartburn, nausea, vomiting, abdominal pain and diarrhea.       +fobt  Genitourinary: Negative for dysuria.       Has urinary incontinence.  Musculoskeletal:       Has b/l leg pain that's chronic. Also has chronic back pain.  Skin: Negative for itching and rash.  Neurological: Negative for dizziness, sensory change, seizures and headaches.  Endo/Heme/Allergies: Negative.   Psychiatric/Behavioral: Negative.    Physical Exam: Blood pressure 131/54, pulse 83, temperature 99.2 F (37.3 C), temperature source Oral, resp. rate 20, height 5\' 7"  (1.702 m), weight 324 lb 8.3 oz (147.2  kg), SpO2 94 %.  Physical Exam  Constitutional: She is oriented to person, place, and time. She appears well-developed and well-nourished. No distress.  Morbidly obese female.  HENT:  Head: Normocephalic and atraumatic.  Right Ear: External ear normal.  Nose: Nose normal.  Mouth/Throat: Oropharynx is clear and  moist. No oropharyngeal exudate.  Eyes: Conjunctivae and EOM are normal.  Neck: Normal range of motion.  Cardiovascular: Normal rate and regular rhythm.  Exam reveals no gallop and no friction rub.   Murmur heard. Has systolic ejection murmur  Respiratory: No stridor. She has no wheezes. She exhibits no tenderness.  desats to 86% on room air, sats back up to 92% on 2L.  Has diffuse rhonchi. Has productive cough with deep inspiration. Has mildly increased WOB.  GI: Soft. She exhibits no mass. There is no tenderness. There is no rebound and no guarding.  obese  Musculoskeletal: She exhibits tenderness. She exhibits no edema.  Has ttp on both legs and back (worse on right side of back than left). Legs are equal in size. No pitting edema.  Neurological: She is alert and oriented to person, place, and time.  Skin: Skin is warm. She is not diaphoretic.   Lab results: Basic Metabolic Panel:  Recent Labs  03/13/14 1554  NA 140  K 3.7  CL 103  CO2 29  GLUCOSE 123*  BUN 13  CREATININE 0.63  CALCIUM 8.5   Liver Function Tests:  Recent Labs  03/13/14 1554  AST 15  ALT 11  ALKPHOS 87  BILITOT 0.4  PROT 6.5  ALBUMIN 3.4*   CBC:  Recent Labs  03/13/14 1554  WBC 13.1*  NEUTROABS 10.5*  HGB 9.8*  HCT 33.6*  MCV 80.4  PLT 252   Cardiac Enzymes:  Recent Labs  03/13/14 1554  TROPONINI <0.03   Imaging results:  Dg Chest 2 View  03/13/2014   CLINICAL DATA:  69 year old female with productive cough and fever for 3 days. Weakness and severe shortness of Breath. Initial encounter.  EXAM: CHEST  2 VIEW  COMPARISON:  Chest CT 02/01/2013 and earlier.  FINDINGS: Large body habitus.  Semi upright AP and lateral views of the chest.  Changes of peripheral pulmonary fibrosis demonstrated by CT in 2015.  Since that time, progressive bilateral pulmonary opacity, mostly interstitial but confluent at the left hilum. Possible small pleural effusions. Stable cardiomegaly and mediastinal  contours. No pneumothorax. Partially visible cervical ACDF hardware. No acute osseous abnormality identified.  IMPRESSION: Increased bilateral pulmonary opacity since 2015 favored to reflect acute pneumonia superimposed on chronic pulmonary fibrosis. Possible small pleural effusions.   Electronically Signed   By: Genevie Ann M.D.   On: 03/13/2014 17:09   Assessment & Plan by Problem: 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   Morbid obesity with BMI of 50.0-59.9, adult   Gastric antral vascular ectasia   Hyperlipidemia LDL goal <130   Anxiety   Gastroesophageal reflux disease   Osteoporosis   CAP (community acquired pneumonia)   Anemia, iron deficiency  Ms. Peltz is a 69 yo morbidly obese smoker female with hx of DVT and PE on chronic anticoagulation admitted for CAP.  Acute hypoxic resp failure 2/2 to CAP - CXR showing multifocal pneumonia (b/l pulm opacity on  top of chronic peripheral pulm fibrosis), Tmax 100F, worsening SOB, productive cough, and mild leukocytosis of 13.1. O2 sat down to 86% on room air.   Noted to have progressive chronic ILD/fibrosis since 2011 on imaging.  Etiologies for multi focal pneumonia includes MRSA, strep pneum, MAC. No hx of immunocompromise. Other contributors of hypoxia includes OHS, resp bronchiolitis, acute anemia. She does not appear volume overloaded on exam, however weight is up from before and BNP is 142. Of note, she is non-complaint with CPAP for her OSA.  Additionally, in the setting of non-compliance with iron supplementation, her Hb is down to 9.8 from 14.6.    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  EF 55-60%, mild LV.  PFT on showing both  FEV1 and FVC improved compared to 12/2007, has restrictive lung disease with decreased Diffusion capacity but TLC is normal. PCP has continuously encouraged smoking cessation for her.  - will get sputum cx, check influenza PCR, strep pneum, legionella, HIV. - got levaquin 1x in ED. Will do ceftriaxone + azithromycin IV for now. - o2 to keep sat >92%. On 2L now. Had desat to 86% on room air when we checked but recovered with 2L.  - continuous pulse ox.  - robitussin for cough - nicotine patch for now. Encouraged smoking cessation - Her PFT did not show improvement with bronchodilator but that could be 2/2 to she already being on bronchodilator. She does feel better with bronchodilator so we will continue it here: duoneb q4hr prn.  Acute on chronic iron deficiency anemia - b/l hgb 13-15. Now 9.8. FOBT positive (has been neg before) and has a hx of GAVE (gastric antral vascular ectasia) - s/p argon plasma coag by Dr. Benson Norway in the past. Last EGD was 03/2012. Last seen by Dr. Henrene Pastor from [facility] 06/2013 (dismissed from Dr. Ulyses Amor practice), screening cologard was offered at that time. Last colonoscopy in 2005 apparently was normal (no report in epic found currently).  -she is currently on lovenox (100mg  bid but based on her weight it should be around 150mg  BID = 1mg /kg). But will keep it same dose as her previous for now and consider going up tomorrow. I believe her risk of stopping anticoag and possible recurrence of DVT is greater than an overt bleed at this time but we will need to monitor CBC and watch for any signs of worsening bleeding closely.  - was supposed to be on iron supplement indefinitely but patient stopped taking it 1 year ago due to cost. Will recheck iron panel. May benefit from IV iron. Will restart oral supplementation at this time.  - will not consult GI yet unless hgb drops significantly or has overt bleeding, but will benefit from outpatient follow up  Hx of DVT and PE--on life long  lovenox.  Currently on 100mg  bid, which is lower than his 1mg /kg weight based dosing - will continue home dose for now, but reassess in AM especially in setting of worsening anemia  Urge and stress incontinence - seen by Dr. Gaynelle [facility] on 02/07/14. Asked for UA and trial of anticholinergics per their note. Per patient, started on macrobid for 10 days on 2/17 and then is supposed to transition to 1 tablet daily for 30 days . Is supposed to get urodynamic study done in the near future.  - hold macrobid for now as ceftriaxone should cover for any UTI - try to get  records from urology in AM for review  GERD - cont PPI qdaily for now. Will do bid if having overt bleeding.  Interstitial cystitis  - on lamictal  Chronic low back pain - saw Dr. Eppie Gibson on 11/04/2013 and got norco 10-325mg  2 tabet q6hr.   Major depression and anxiety  - working with therapist, sertaline daily + ativan BID + seroquel at home - we did ativan only qhs here for now, continued the rest.  HLD - cont pravastatin 80mg  daily.  Tobacco abuse - nicotine patch - cessation strongly encouraged and counseling provided today. She is considering cessation and wishes to discuss further with PCP.   Osteoporosis  - on alendronate (takes every Friday), not taking calcium/vit D supplements  Full code Diet: Regular DVT Ppx: Lovenox Dispo: Disposition is deferred at this time, awaiting improvement of current medical problems. Anticipated discharge in approximately 2-3 day(s).   The patient does have a current PCP Karren Cobble, MD) and does need an [facility] hospital follow-up appointment after discharge.  The patient does have transportation limitations that hinder transportation to clinic appointments.  Signed: Dellia Nims, MD 03/13/2014, 10:19 PM

## 2014-03-13 NOTE — Progress Notes (Signed)
Paged MD oncall to make them aware that pt had arrived from [facility] ED. MD stated they would be up to see pt. Ranelle Oyster, RN (charge nurse)

## 2014-03-14 ENCOUNTER — Encounter (HOSPITAL_COMMUNITY): Payer: Self-pay | Admitting: *Deleted

## 2014-03-14 LAB — EXPECTORATED SPUTUM ASSESSMENT W REFEX TO RESP CULTURE: SPECIAL REQUESTS: NORMAL

## 2014-03-14 LAB — CBC WITH DIFFERENTIAL/PLATELET
BASOS ABS: 0 10*3/uL (ref 0.0–0.1)
Basophils Relative: 0 % (ref 0–1)
EOS ABS: 0.5 10*3/uL (ref 0.0–0.7)
EOS PCT: 3 % (ref 0–5)
HEMATOCRIT: 31.6 % — AB (ref 36.0–46.0)
Hemoglobin: 9.1 g/dL — ABNORMAL LOW (ref 12.0–15.0)
Lymphocytes Relative: 10 % — ABNORMAL LOW (ref 12–46)
Lymphs Abs: 1.5 10*3/uL (ref 0.7–4.0)
MCH: 23 pg — AB (ref 26.0–34.0)
MCHC: 28.8 g/dL — AB (ref 30.0–36.0)
MCV: 80 fL (ref 78.0–100.0)
MONO ABS: 0.9 10*3/uL (ref 0.1–1.0)
Monocytes Relative: 6 % (ref 3–12)
Neutro Abs: 11.8 10*3/uL — ABNORMAL HIGH (ref 1.7–7.7)
Neutrophils Relative %: 81 % — ABNORMAL HIGH (ref 43–77)
Platelets: 250 10*3/uL (ref 150–400)
RBC: 3.95 MIL/uL (ref 3.87–5.11)
RDW: 18.7 % — AB (ref 11.5–15.5)
WBC: 14.6 10*3/uL — ABNORMAL HIGH (ref 4.0–10.5)

## 2014-03-14 LAB — INFLUENZA PANEL BY PCR (TYPE A & B)
H1N1 flu by pcr: NOT DETECTED
INFLAPCR: NEGATIVE
INFLBPCR: NEGATIVE

## 2014-03-14 LAB — MRSA PCR SCREENING: MRSA by PCR: NEGATIVE

## 2014-03-14 LAB — CBC
HEMATOCRIT: 30.9 % — AB (ref 36.0–46.0)
HEMOGLOBIN: 8.9 g/dL — AB (ref 12.0–15.0)
MCH: 23 pg — ABNORMAL LOW (ref 26.0–34.0)
MCHC: 28.8 g/dL — ABNORMAL LOW (ref 30.0–36.0)
MCV: 79.8 fL (ref 78.0–100.0)
Platelets: 235 10*3/uL (ref 150–400)
RBC: 3.87 MIL/uL (ref 3.87–5.11)
RDW: 18.7 % — ABNORMAL HIGH (ref 11.5–15.5)
WBC: 10.3 10*3/uL (ref 4.0–10.5)

## 2014-03-14 LAB — EXPECTORATED SPUTUM ASSESSMENT W GRAM STAIN, RFLX TO RESP C

## 2014-03-14 LAB — IRON AND TIBC
Iron: 13 ug/dL — ABNORMAL LOW (ref 42–145)
Saturation Ratios: 4 % — ABNORMAL LOW (ref 20–55)
TIBC: 341 ug/dL (ref 250–470)
UIBC: 328 ug/dL (ref 125–400)

## 2014-03-14 LAB — TYPE AND SCREEN
ABO/RH(D): A POS
Antibody Screen: NEGATIVE

## 2014-03-14 LAB — FERRITIN: Ferritin: 26 ng/mL (ref 10–291)

## 2014-03-14 MED ORDER — ACETAMINOPHEN 500 MG PO TABS: 500.0000 mg | ORAL_TABLET | Freq: Four times a day (QID) | ORAL | Status: DC | PRN

## 2014-03-14 MED ORDER — CETYLPYRIDINIUM CHLORIDE 0.05 % MT LIQD
7.0000 mL | Freq: Two times a day (BID) | OROMUCOSAL | Status: DC
  Administered 2014-03-14 – 2014-04-06 (×44): 7 mL via OROMUCOSAL

## 2014-03-14 MED ORDER — ACETAMINOPHEN 500 MG PO TABS
500.0000 mg | ORAL_TABLET | Freq: Four times a day (QID) | ORAL | Status: DC | PRN
  Administered 2014-03-14 – 2014-03-20 (×3): 500 mg via ORAL
  Filled 2014-03-14 (×3): qty 1

## 2014-03-14 NOTE — Progress Notes (Addendum)
Patient's sputum - too many epithelial cells in it. Needs to be recollect.

## 2014-03-14 NOTE — H&P (Signed)
Internal Medicine Attending Admission Note Date: 03/14/2014  Patient name: [name] Medical record number: [id number] Date of birth: 1945/05/11 Age: 69 y.o. Gender: female  I saw and evaluated the patient. I reviewed the resident's note and I agree with the resident's findings and plan as documented in the resident's note.  Chief Complaint(s): Subjective fevers and chills, progressive dyspnea, and productive cough 1 week.  History - key components related to admission:  [name] is a 69 year old woman well known to me as I am her primary care physician, who presents with a one-week history of subjective fevers and chills, progressive dyspnea, and productive cough. Initially her sputum was white and has progressed to brown. As her dyspnea progressed she became nervous and presented to the [facility] emergency department. There she was noted to have worsening bilateral interstitial infiltrates and was admitted to the internal medicine teaching service for further evaluation and care. Of note, I been concerned she has mild interstitial lung disease and possible respiratory bronchiolitis from smoking but this is considerably worse radiographically upon presentation.  Physical Exam - key components related to admission:  Filed Vitals:   03/13/14 2107 03/14/14 0629 03/14/14 0632 03/14/14 1457  BP: 131/54 101/38 106/44 125/54  Pulse: 83 80 78 87  Temp: 99.2 F (37.3 C) 98.3 F (36.8 C)  99.1 F (37.3 C)  TempSrc: Oral Oral  Oral  Resp: 20 22 20 22   Height:      Weight:      SpO2: 94% 93% 93% 94%   Gen.: Well-developed, well-nourished, morbidly obese woman lying comfortably in bed in no acute distress. Lungs: Bilateral expiratory wheezes anteriorly with occasional inspiratory rhonchi. There were no bronchial breath sounds appreciated. Heart: Regular rate and rhythm without murmurs, rubs, or gallops.  Lab results:  Basic Metabolic Panel:  Recent Labs  03/13/14 1554  NA 140   K 3.7  CL 103  CO2 29  GLUCOSE 123*  BUN 13  CREATININE 0.63  CALCIUM 8.5   Liver Function Tests:  Recent Labs  03/13/14 1554  AST 15  ALT 11  ALKPHOS 87  BILITOT 0.4  PROT 6.5  ALBUMIN 3.4*   CBC:  Recent Labs  03/13/14 1554 03/14/14 0552 03/14/14 1535  WBC 13.1* 14.6* 10.3  NEUTROABS 10.5* 11.8*  --   HGB 9.8* 9.1* 8.9*  HCT 33.6* 31.6* 30.9*  MCV 80.4 80.0 79.8  PLT 252 250 235   Cardiac Enzymes:  Recent Labs  03/13/14 1554  TROPONINI <0.03   BNP:  142  Anemia Panel:  Recent Labs  03/14/14 0552  FERRITIN 26  TIBC 341  IRON 13*   Imaging results:  Dg Chest 2 View  03/13/2014   CLINICAL DATA:  69 year old female with productive cough and fever for 3 days. Weakness and severe shortness of Breath. Initial encounter.  EXAM: CHEST  2 VIEW  COMPARISON:  Chest CT 02/01/2013 and earlier.  FINDINGS: Large body habitus.  Semi upright AP and lateral views of the chest.  Changes of peripheral pulmonary fibrosis demonstrated by CT in 2015.  Since that time, progressive bilateral pulmonary opacity, mostly interstitial but confluent at the left hilum. Possible small pleural effusions. Stable cardiomegaly and mediastinal contours. No pneumothorax. Partially visible cervical ACDF hardware. No acute osseous abnormality identified.  IMPRESSION: Increased bilateral pulmonary opacity since 2015 favored to reflect acute pneumonia superimposed on chronic pulmonary fibrosis. Possible small pleural effusions.   Electronically Signed   By: Genevie Ann M.D.   On: 03/13/2014  17:09   Assessment & Plan by Problem:  [name] is a 69 year old woman with multiple medical problems who presents with a one-week history of fevers, chills, productive cough, and progressive dyspnea with worsening interstitial infiltrates on chest x-ray. Although she has some underlying interstitial process, likely related to respiratory bronchiolitis from smoking, she has what appears to be a superimposed  atypical community-acquired pneumonia which fits with her clinical symptoms.  1) Atypical community-acquired pneumonia: We will treat with intravenous ceftriaxone and azithromycin to cover the typical and atypical community-acquired organisms. She will also be given bronchodilators to help with mucociliary clearance. We will await blood cultures but I am concerned he may have been obtained after she received antibiotics in the emergency department. We will assess her response clinically. If she improves over the next 3-5 days no further evaluation will be required other than A. fib repeat chest x-ray to look for improvement in the interstitial infiltrates in approximately 6 weeks. If there is no improvement clinically over the next 3-5 days we may be further evaluating these interstitial infiltrates with a CT scan.  2) Tobacco abuse: [name] is now motivated to quit smoking. She was started on a nicotine patch and will be discharged on a decreasing dosing schedule. She was praised for making this step  3) Iron deficiency anemia: related to poor compliance with the iron therapy. The fecal occult blood test positivity is concerning for a possible GI loss of iron as well and may require further evaluation by GI as an outpatient. During the acute infectious phase we will not provide her with IV iron or oral iron well discharge her home on oral iron supplementation.  4) Disposition: Pending her response to the above intervention.

## 2014-03-14 NOTE — Progress Notes (Signed)
Subjective:    Currently, the patient reports slight improvement of her breathing since yesterday, but she continues to have malaise and shortness of breath. She also continues to have a productive cough.  Interval Events: -White blood cell count up to 14.0 this morning. -Hemoglobin down from 9.8 to 9.1.  -BNP 142.    Objective:    Vital Signs:   Temp:  [98.3 F (36.8 C)-100 F (37.8 C)] 98.3 F (36.8 C) (02/23 0629) Pulse Rate:  [71-86] 78 (02/23 0632) Resp:  [20-24] 20 (02/23 2694) BP: (101-137)/(38-85) 106/44 mmHg (02/23 0632) SpO2:  [86 %-95 %] 93 % (02/23 0632) Weight:  [324 lb 8.3 oz (147.2 kg)-380 lb (172.367 kg)] 324 lb 8.3 oz (147.2 kg) (02/22 2100)    24-hour weight change: Weight change:   Intake/Output:   Intake/Output Summary (Last 24 hours) at 03/14/14 1337 Last data filed at 03/14/14 1200  Gross per 24 hour  Intake   1263 ml  Output      0 ml  Net   1263 ml      Physical Exam: General: Morbidly obese. Resting comfortably, alert and oriented.   Lungs:  Increased respiratory effort with diffuse rhonchi and wheezing.  Heart: RRR. 2/6 systolic ejection murmur.  Abdomen:  BS normoactive. Soft, Nondistended, non-tender.  No masses or organomegaly.  Extremities: Trace lower extremity.     Labs:  Basic Metabolic Panel:  Recent Labs Lab 03/13/14 1554  NA 140  K 3.7  CL 103  CO2 29  GLUCOSE 123*  BUN 13  CREATININE 0.63  CALCIUM 8.5    Liver Function Tests:  Recent Labs Lab 03/13/14 1554  AST 15  ALT 11  ALKPHOS 87  BILITOT 0.4  PROT 6.5  ALBUMIN 3.4*   CBC:  Recent Labs Lab 03/13/14 1554 03/14/14 0552  WBC 13.1* 14.6*  NEUTROABS 10.5* 11.8*  HGB 9.8* 9.1*  HCT 33.6* 31.6*  MCV 80.4 80.0  PLT 252 250    Cardiac Enzymes:  Recent Labs Lab 03/13/14 1554  [place] <0.03   Microbiology: Results for orders placed or performed during the hospital encounter of 03/13/14  MRSA PCR Screening     Status: None   Collection Time: 03/14/14  3:25 AM  Result Value Ref Range Status   MRSA by PCR NEGATIVE NEGATIVE Final    Comment:        The GeneXpert MRSA Assay (FDA approved for NASAL specimens only), is one component of a comprehensive MRSA colonization surveillance program. It is not intended to diagnose MRSA infection nor to guide or monitor treatment for MRSA infections.    Imaging: Dg Chest 2 View  03/13/2014   CLINICAL DATA:  69 year old female with productive cough and fever for 3 days. Weakness and severe shortness of Breath. Initial encounter.  EXAM: CHEST  2 VIEW  COMPARISON:  Chest CT 02/01/2013 and earlier.  FINDINGS: Large body habitus.  Semi upright AP and lateral views of the chest.  Changes of peripheral pulmonary fibrosis demonstrated by CT in 2015.  Since that time, progressive bilateral pulmonary opacity, mostly interstitial but confluent at the left hilum. Possible small pleural effusions. Stable cardiomegaly and mediastinal contours. No pneumothorax. Partially visible cervical ACDF hardware. No acute osseous abnormality identified.  IMPRESSION: Increased bilateral pulmonary opacity since 2015 favored to reflect acute pneumonia superimposed on chronic pulmonary fibrosis. Possible small pleural effusions.   Electronically Signed   By: Genevie Ann M.D.   On: 03/13/2014 17:09  Medications:    Infusions:    Scheduled Medications: . antiseptic oral rinse  7 mL Mouth Rinse BID  . azithromycin  500 mg Intravenous Q24H  . cefTRIAXone (ROCEPHIN)  IV  2 g Intravenous Q24H  . enoxaparin  100 mg Subcutaneous BID  . ferrous sulfate  325 mg Oral TID WC  . gabapentin  100 mg Oral TID  . ipratropium-albuterol  3 mL Nebulization Once  . lamoTRIgine  200 mg Oral q morning - 10a  . LORazepam  0.5 mg Oral QHS  . nicotine  14 mg Transdermal QHS  . pantoprazole  40 mg Oral Daily  . pravastatin  80 mg Oral Daily  . QUEtiapine  400 mg Oral QHS  . sertraline  200 mg Oral q morning - 10a  .  sodium chloride  3 mL Intravenous Q12H  . 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 However, cannot rule out in ILD flare given previous CT imaging and her continued smoking. In addition, carcinomatosis could be possible given her history of mastectomy. However, she says that she had cysts in her breasts bilaterally, and she did not complete any additional chemotherapy afterwards, making cancer spread less likely. I doubt heart failure given her BNP of 142, she does not appear volume overloaded on exam. -Continue ceftriaxone and azithromycin, day 1 of 7. -Follow-up sputum and blood cultures. -Continue oxygen supplementation to keep saturations greater than 92%. -Continue DuoNeb every 6 hours as needed. -She is tolerating a diet so will not continue IV fluids, can give boluses as needed. -Continue Robitussin-DM every 6 hours as needed. -Tylenol 500 mg every 6 hours as needed. -Consult PT and OT once status improves.  #Acute on chronic iron deficiency anemia FOBT was positive, but she does not have any signs of overt bleeding currently. Hemoglobin relatively stable this morning, suggesting this is from poor compliance with her  iron supplementation. She is not symptomatic with regard to her anemia, but this would be difficult to tell given her hypoxia. History of gastric antral vascular ectasia, so at risk for GI bleed. She may benefit from IV iron in the future, but we'll hold off on that for now given her pneumonia. -Continue ferrous sulfate 325 mg 3 times a day with meals. -Continue to monitor hemoglobin.  #History of DVT and PE She is on chronic Lovenox at 100 mg twice a day per hematology. This is lower than typical dosing of 1 mg/kg. The reason for this is unclear.  -Check anti-Xa level 4 hours after dosing. Discussed with pharmacy who will place the order. -Continue Lovenox 100 mg twice a day for now.  #Urge and stress incontinence Started on Macrobid at appointment with Dr. Gaynelle Arabian of [facility]. Per the patient, she is supposed to continue  on Macrobid daily for 30 days. -We'll work on getting urology records. -Hold Macrobid currently as she is on ceftriaxone.  #GERD -Continue home Protonix 40 mg daily.  #Chronic low back pain -Continue home Norco every 6 hours as needed.  #Depression and anxiety -Continue Ativan 0.5 mg daily at bedtime. -Continue home Lamictal, Seroquel, and Zoloft.  #Hyperlipidemia -Continue pravastatin 80 mg daily.  #Tobacco abuse Discussed the need to quit smoking. -Nicotine patch.   DVT PPX - low molecular weight heparin  CODE STATUS - Full.  CONSULTS PLACED - None.  DISPO - Disposition is deferred at this time, awaiting improvement of hypoxia.   Anticipated discharge in approximately 3-4 day(s).   The patient does have a current PCP (KLIMA, Damita Lack, MD) and does need an [facility] hospital follow-up appointment after discharge.    Is the [facility] hospital follow-up appointment a one-time only appointment? no.  Does the patient have transportation limitations that hinder transportation to clinic appointments? yes   SERVICE NEEDED AT [place]         Y = Yes, Blank = No PT:   OT:   RN:   Equipment:   Other:      Length of Stay: 1 day(s)   Signed: Arman Filter, MD  PGY-1, Internal Medicine Resident Pager: [phone] (7AM-5PM) 03/14/2014, 1:37 PM

## 2014-03-15 DIAGNOSIS — F17200 Nicotine dependence, unspecified, uncomplicated: Secondary | ICD-10-CM

## 2014-03-15 DIAGNOSIS — J188 Other pneumonia, unspecified organism: Secondary | ICD-10-CM

## 2014-03-15 LAB — LEGIONELLA ANTIGEN, URINE

## 2014-03-15 LAB — STREP PNEUMONIAE URINARY ANTIGEN: Strep Pneumo Urinary Antigen: NEGATIVE

## 2014-03-15 LAB — HIV ANTIBODY (ROUTINE TESTING W REFLEX): HIV SCREEN 4TH GENERATION: NONREACTIVE

## 2014-03-15 LAB — PHOSPHORUS: Phosphorus: 2.9 mg/dL (ref 2.3–4.6)

## 2014-03-15 LAB — MAGNESIUM: Magnesium: 2.2 mg/dL (ref 1.5–2.5)

## 2014-03-15 LAB — EXPECTORATED SPUTUM ASSESSMENT W GRAM STAIN, RFLX TO RESP C: Special Requests: NORMAL

## 2014-03-15 LAB — HEPARIN ANTI-XA: Heparin LMW: 0.72 IU/mL

## 2014-03-15 LAB — EXPECTORATED SPUTUM ASSESSMENT W REFEX TO RESP CULTURE

## 2014-03-15 MED ORDER — LORAZEPAM 1 MG PO TABS
2.0000 mg | ORAL_TABLET | Freq: Every day | ORAL | Status: DC
  Administered 2014-03-15 – 2014-04-05 (×22): 2 mg via ORAL
  Filled 2014-03-15: qty 4
  Filled 2014-03-15: qty 2
  Filled 2014-03-15: qty 4
  Filled 2014-03-15: qty 2
  Filled 2014-03-15 (×4): qty 4
  Filled 2014-03-15 (×2): qty 2
  Filled 2014-03-15 (×3): qty 4
  Filled 2014-03-15: qty 2
  Filled 2014-03-15: qty 4
  Filled 2014-03-15: qty 2
  Filled 2014-03-15 (×5): qty 4
  Filled 2014-03-15: qty 2

## 2014-03-15 MED ORDER — IPRATROPIUM-ALBUTEROL 0.5-2.5 (3) MG/3ML IN SOLN
3.0000 mL | Freq: Four times a day (QID) | RESPIRATORY_TRACT | Status: DC
  Administered 2014-03-15 – 2014-03-17 (×6): 3 mL via RESPIRATORY_TRACT
  Filled 2014-03-15 (×6): qty 3

## 2014-03-15 MED ORDER — ALBUTEROL SULFATE (2.5 MG/3ML) 0.083% IN NEBU
2.5000 mg | INHALATION_SOLUTION | RESPIRATORY_TRACT | Status: DC | PRN
  Administered 2014-03-15 – 2014-03-20 (×4): 2.5 mg via RESPIRATORY_TRACT
  Filled 2014-03-15 (×5): qty 3

## 2014-03-15 MED ORDER — BENZONATATE 100 MG PO CAPS
100.0000 mg | ORAL_CAPSULE | Freq: Three times a day (TID) | ORAL | Status: DC
  Administered 2014-03-15 – 2014-04-06 (×68): 100 mg via ORAL
  Filled 2014-03-15 (×68): qty 1

## 2014-03-15 MED ORDER — LORAZEPAM 1 MG PO TABS
1.0000 mg | ORAL_TABLET | Freq: Every morning | ORAL | Status: DC
  Administered 2014-03-15 – 2014-04-06 (×23): 1 mg via ORAL
  Filled 2014-03-15: qty 1
  Filled 2014-03-15 (×2): qty 2
  Filled 2014-03-15: qty 1
  Filled 2014-03-15: qty 2
  Filled 2014-03-15 (×2): qty 1
  Filled 2014-03-15: qty 2
  Filled 2014-03-15: qty 1
  Filled 2014-03-15 (×3): qty 2
  Filled 2014-03-15: qty 1
  Filled 2014-03-15: qty 2
  Filled 2014-03-15 (×2): qty 1
  Filled 2014-03-15 (×6): qty 2
  Filled 2014-03-15: qty 1

## 2014-03-15 NOTE — Progress Notes (Signed)
After a PRN breathing treatment, satO2 continue to be under 90% (88%). MD informed. No new orders at this time. Will continue to monitor.

## 2014-03-15 NOTE — Progress Notes (Signed)
Internal Medicine Attending  Date: 03/15/2014  Patient name: [name] Medical record number: [id number] Date of birth: 1945/06/02 Age: 69 y.o. Gender: female  I saw and evaluated the patient. I reviewed the resident's note by Dr. Trudee Kuster and I agree with the resident's findings and plans as documented in his progress note.  Symptomatically [name] is relatively unchanged from admission. She continues to have a productive cough of brown sputum. She remains without the desire to smoke and is without new complaints today. We will continue therapy for her presumed pneumonic process.

## 2014-03-15 NOTE — Progress Notes (Signed)
Subjective:    She continues to have difficulty breathing and a productive cough. She thinks she is stable from yesterday. She is requesting her home lorazepam dose.  Interval Events: -Sputum sample insufficient for culture. -White blood cell count improved to 10.3 today. -Hemoglobin stable from yesterday.  -Strep pneumo antigen negative. -Temperature to 100.5 yesterday.  -Satting in the low 90s on 3 L nasal cannula.    Objective:    Vital Signs:   Temp:  [99 F (37.2 C)-100.5 F (38.1 C)] 100 F (37.8 C) (02/24 0612) Pulse Rate:  [87-88] 88 (02/24 0612) Resp:  [21-23] 23 (02/24 0612) BP: (113-142)/(49-62) 113/49 mmHg (02/24 0612) SpO2:  [90 %-94 %] 90 % (02/24 0612) Last BM Date: 03/13/14  24-hour weight change: Weight change:   Intake/Output:   Intake/Output Summary (Last 24 hours) at 03/15/14 0832 Last data filed at 03/14/14 2320  Gross per 24 hour  Intake   2080 ml  Output    100 ml  Net   1980 ml      Physical Exam: General: Morbidly obese. Resting comfortably, alert and oriented.   Lungs:  Increased respiratory effort with diffuse rhonchi and wheezing.  Heart: RRR. 2/6 systolic ejection murmur.  Abdomen:  BS normoactive. Soft, Nondistended, non-tender.  No masses or organomegaly.  Extremities: Trace lower extremity edema.     Labs:  Basic Metabolic Panel:  Recent Labs Lab 03/13/14 1554 03/15/14 0557  NA 140  --   K 3.7  --   CL 103  --   CO2 29  --   GLUCOSE 123*  --   BUN 13  --   CREATININE 0.63  --   CALCIUM 8.5  --   MG  --  2.2  PHOS  --  2.9    Liver Function Tests:  Recent Labs Lab 03/13/14 1554  AST 15  ALT 11  ALKPHOS 87  BILITOT 0.4  PROT 6.5  ALBUMIN 3.4*   CBC:  Recent Labs Lab 03/13/14 1554 03/14/14 0552 03/14/14 1535  WBC 13.1* 14.6* 10.3  NEUTROABS 10.5* 11.8*  --   HGB 9.8* 9.1* 8.9*  HCT 33.6* 31.6* 30.9*  MCV 80.4 80.0 79.8  PLT 252 250 235    Cardiac Enzymes:  Recent Labs Lab 03/13/14 [place] <0.03   Microbiology: Results for orders placed or performed during the hospital encounter of 03/13/14  Culture, blood (routine x 2) Call MD if unable to obtain prior to antibiotics being given     Status: None (Preliminary result)   Collection Time: 03/13/14 10:35 PM  Result Value Ref Range Status   Specimen Description BLOOD LEFT ARM  Final   Special Requests BOTTLES DRAWN AEROBIC AND ANAEROBIC 5CC  Final   Culture   Final           BLOOD CULTURE RECEIVED NO GROWTH TO DATE CULTURE WILL BE HELD FOR 5 DAYS BEFORE ISSUING A FINAL NEGATIVE REPORT Performed at [facility]    Report Status PENDING  Incomplete  Culture, blood (routine x 2) Call MD if unable to obtain prior to antibiotics being given     Status: None (Preliminary result)   Collection Time: 03/13/14 10:40 PM  Result Value Ref Range Status   Specimen Description BLOOD RIGHT ARM  Final   Special Requests BOTTLES DRAWN AEROBIC AND ANAEROBIC 5CC  Final   Culture   Final           BLOOD CULTURE RECEIVED NO GROWTH  TO DATE CULTURE WILL BE HELD FOR 5 DAYS BEFORE ISSUING A FINAL NEGATIVE REPORT Performed at [facility]    Report Status PENDING  Incomplete  MRSA PCR Screening     Status: None   Collection Time: 03/14/14  3:25 AM  Result Value Ref Range Status   MRSA by PCR NEGATIVE NEGATIVE Final    Comment:        The GeneXpert MRSA Assay (FDA approved for NASAL specimens only), is one component of a comprehensive MRSA colonization surveillance program. It is not intended to diagnose MRSA infection nor to guide or monitor treatment for MRSA infections.   Culture, sputum-assessment     Status: None   Collection Time: 03/14/14  4:04 PM  Result Value Ref Range Status   Specimen Description SPUTUM  Final   Special Requests Normal  Final   Sputum evaluation   Final    MICROSCOPIC FINDINGS SUGGEST THAT THIS SPECIMEN IS NOT REPRESENTATIVE OF LOWER RESPIRATORY SECRETIONS. PLEASE RECOLLECT. NOTIFIED  Dallie Piles 03/14/14 1722 M [place]    Report Status 03/14/2014 FINAL  Final   Imaging: Dg Chest 2 View  03/13/2014   CLINICAL DATA:  69 year old female with productive cough and fever for 3 days. Weakness and severe shortness of Breath. Initial encounter.  EXAM: CHEST  2 VIEW  COMPARISON:  Chest CT 02/01/2013 and earlier.  FINDINGS: Large body habitus.  Semi upright AP and lateral views of the chest.  Changes of peripheral pulmonary fibrosis demonstrated by CT in 2015.  Since that time, progressive bilateral pulmonary opacity, mostly interstitial but confluent at the left hilum. Possible small pleural effusions. Stable cardiomegaly and mediastinal contours. No pneumothorax. Partially visible cervical ACDF hardware. No acute osseous abnormality identified.  IMPRESSION: Increased bilateral pulmonary opacity since 2015 favored to reflect acute pneumonia superimposed on chronic pulmonary fibrosis. Possible small pleural effusions.   Electronically Signed   By: Genevie Ann M.D.   On: 03/13/2014 17:09       Medications:    Infusions:    Scheduled Medications: . antiseptic oral rinse  7 mL Mouth Rinse BID  . azithromycin  500 mg Intravenous Q24H  . cefTRIAXone (ROCEPHIN)  IV  2 g Intravenous Q24H  . enoxaparin  100 mg Subcutaneous BID  . gabapentin  100 mg Oral TID  . ipratropium-albuterol  3 mL Nebulization Once  . ipratropium-albuterol  3 mL Nebulization Q6H  . lamoTRIgine  200 mg Oral q morning - 10a  . LORazepam  1 mg Oral q morning - 10a  . LORazepam  2 mg Oral QHS  . nicotine  14 mg Transdermal QHS  . pantoprazole  40 mg Oral Daily  . pravastatin  80 mg Oral Daily  . QUEtiapine  400 mg Oral QHS  . sertraline  200 mg Oral q morning - 10a  . sodium chloride  3 mL Intravenous Q12H  . 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 She will likely take some time to respond to antibiotics given the extensive nature of her pneumonia. Blood pressure stable. -Continue ceftriaxone and azithromycin, day 2 of 7. -Follow-up sputum and blood cultures. Repeat collection of sputum culture. -Continue oxygen supplementation to keep saturations greater than 92%. -Schedule DuoNeb every 6 hours. -Continue Robitussin-DM every 6 hours as needed. -Tessalon Perles 3 times daily. -Tylenol 500 mg every 6 hours as needed. -Incentive spirometry -Consult PT and OT once status improves.  #Acute on chronic iron deficiency anemia Hemoglobin remains stable without overt bleeding, this is most consistent with iron deficiency anemia. We will hold iron in the setting of active infection. -Stop ferrous sulfate 325 mg 3 times a day with meals. -Continue to monitor hemoglobin.  #History of DVT and PE She is on chronic Lovenox at 100 mg twice a day per hematology. This is lower than typical dosing of 1 mg/kg. The reason for this is unclear.  -Check heparin anti-Xa level 4 hours after dosing today. -Continue Lovenox 100 mg twice a day for now.  #Urge and stress incontinence Started on Macrobid at appointment with Dr. Gaynelle Arabian of [facility]. Per the patient, she is supposed to continue on Macrobid daily for 30 days. -Working on getting urology records. -Hold Macrobid  currently as she is on ceftriaxone.  #GERD -Continue home Protonix 40 mg daily.  #Chronic low back pain -Continue home Norco every 6 hours as needed.  #Depression and anxiety She is alert today and should be able to tolerate her home Ativan. -Restart home Ativan 1 mg in the morning and 2 mg at night. -Continue home Lamictal, Seroquel, and Zoloft.  #Hyperlipidemia -Continue pravastatin 80 mg daily.  #Tobacco abuse -Nicotine patch.   DVT PPX - low molecular weight heparin  CODE STATUS - Full.  CONSULTS PLACED - None.  DISPO - Disposition is deferred at this time, awaiting improvement of hypoxia.   Anticipated discharge in approximately 3-4 day(s).   The patient does have a current PCP (KLIMA, Damita Lack, MD) and does need an [facility] hospital follow-up appointment after discharge.    Is the [facility] hospital follow-up appointment a one-time only appointment? no.  Does the patient have transportation limitations that hinder transportation to clinic appointments? yes   SERVICE NEEDED AT [place]         Y = Yes, Blank = No PT:   OT:   RN:   Equipment:   Other:      Length of Stay: 2 day(s)   Signed: Arman Filter, MD  PGY-1, Internal Medicine Resident Pager: [phone] (7AM-5PM) 03/15/2014, 8:32 AM

## 2014-03-15 NOTE — Progress Notes (Signed)
Patient noticed to have sat O2= 88% at 3L via [place]. RN encouraged patient to take deep breaths and to use the incentive spirometer. After 1 hr, sat O2 was the same, 88%. MD informed. Will continue to monitor.

## 2014-03-16 DIAGNOSIS — E876 Hypokalemia: Secondary | ICD-10-CM

## 2014-03-16 LAB — BASIC METABOLIC PANEL
Anion gap: 10 (ref 5–15)
BUN: 7 mg/dL (ref 6–23)
CO2: 31 mmol/L (ref 19–32)
Calcium: 7.9 mg/dL — ABNORMAL LOW (ref 8.4–10.5)
Chloride: 97 mmol/L (ref 96–112)
Creatinine, Ser: 0.69 mg/dL (ref 0.50–1.10)
GFR, EST NON AFRICAN AMERICAN: 87 mL/min — AB (ref >90)
GLUCOSE: 126 mg/dL — AB (ref 70–99)
POTASSIUM: 3.4 mmol/L — AB (ref 3.5–5.1)
SODIUM: 138 mmol/L (ref 135–145)

## 2014-03-16 LAB — CBC
HCT: 31.1 % — ABNORMAL LOW (ref 36.0–46.0)
HEMOGLOBIN: 9 g/dL — AB (ref 12.0–15.0)
MCH: 23.1 pg — ABNORMAL LOW (ref 26.0–34.0)
MCHC: 28.9 g/dL — ABNORMAL LOW (ref 30.0–36.0)
MCV: 79.9 fL (ref 78.0–100.0)
Platelets: 266 10*3/uL (ref 150–400)
RBC: 3.89 MIL/uL (ref 3.87–5.11)
RDW: 18.7 % — ABNORMAL HIGH (ref 11.5–15.5)
WBC: 11.8 10*3/uL — ABNORMAL HIGH (ref 4.0–10.5)

## 2014-03-16 MED ORDER — POTASSIUM CHLORIDE CRYS ER 20 MEQ PO TBCR
40.0000 meq | EXTENDED_RELEASE_TABLET | Freq: Once | ORAL | Status: AC
  Administered 2014-03-16: 40 meq via ORAL
  Filled 2014-03-16: qty 2

## 2014-03-16 NOTE — Evaluation (Signed)
Physical Therapy Evaluation Patient Details Name: [name] MRN: [id number] DOB: 04-22-1945 Today's Date: 03/16/2014   History of Present Illness  69 yo female with onset of fever, cough, SOB was admitted with acute respiratory failure and CAP.    Clinical Impression  Pt is admitted with onset of hypoxic resp failure and is anxious to go straight home from hospital.  Her caregiver is there only a few hours a day, and proposed SNF.  Her reaction was to decline this and did note heavy cigarette smoke smell on her clothing in her closet, which may be an issue for SNF.  Continue therapy as needed to increase gait on RW and transfer independence as able.    Follow Up Recommendations SNF;Supervision/Assistance - 24 hour    Equipment Recommendations  Rolling walker with 5" wheels    Recommendations for Other Services       Precautions / Restrictions Precautions Precautions: Fall Precaution Comments: needs reminders for mobility instructions Restrictions Weight Bearing Restrictions: No      Mobility  Bed Mobility Overal bed mobility: Needs Assistance Bed Mobility: Rolling;Supine to Sit;Sit to Supine Rolling: Mod assist   Supine to sit: Mod assist;Max assist Sit to supine: Mod assist;Max assist   General bed mobility comments: cues for hand rail use and to sequence for spine protection  Transfers                 General transfer comment: declined to stand  Ambulation/Gait             General Gait Details: declined to attempt  Stairs            Wheelchair Mobility    Modified Rankin (Stroke Patients Only)       Balance Overall balance assessment: Needs assistance Sitting-balance support: Bilateral upper extremity supported Sitting balance-Leahy Scale: Fair                                       Pertinent Vitals/Pain Pain Assessment: Faces Faces Pain Scale: Hurts little more Pain Location: knees Pain Intervention(s):  Limited activity within patient's tolerance;Monitored during session;Repositioned  BP was 104/79, pulse 82, O2 sat 92% per nursing collection.    Home Living Family/patient expects to be discharged to:: Private residence Living Arrangements: Alone Available Help at Discharge: Personal care attendant Type of Home: House Home Access: Stairs to enter Entrance Stairs-Rails: Left Entrance Stairs-Number of Steps: 2 Home Layout: One level Home Equipment: [place] - 4 wheels;Shower seat;Bedside commode      Prior Function Level of Independence: Needs assistance   Gait / Transfers Assistance Needed: indepe for short distances with rollator  ADL's / Homemaking Assistance Needed: caregiver to assist        Hand Dominance        Extremity/Trunk Assessment   Upper Extremity Assessment: Generalized weakness           Lower Extremity Assessment: Overall WFL for tasks assessed      Cervical / Trunk Assessment: Normal  Communication   Communication: No difficulties  Cognition Arousal/Alertness: Awake/alert Behavior During Therapy: Anxious Overall Cognitive Status: Within Functional Limits for tasks assessed                      General Comments General comments (skin integrity, edema, etc.): Pt requires help for all movmeent and is declining to be sent to SNF.  Her care level  will be there unless she becomes more capable to try this    Exercises Other Exercises Other Exercises: RLE is 4- strength and LLE 4+ strength      Assessment/Plan    PT Assessment Patient needs continued PT services  PT Diagnosis Generalized weakness   PT Problem List Decreased strength;Decreased range of motion;Decreased activity tolerance;Decreased balance;Decreased mobility;Decreased coordination;Decreased cognition;Decreased knowledge of use of DME;Decreased safety awareness;Decreased knowledge of precautions;Cardiopulmonary status limiting activity;Obesity;Pain  PT Treatment  Interventions DME instruction;Gait training;Stair training;Functional mobility training;Therapeutic activities;Therapeutic exercise;Balance training;Neuromuscular re-education;Cognitive remediation;Patient/family education   PT Goals (Current goals can be found in the Care Plan section) Acute Rehab PT Goals Patient Stated Goal: to go home from hospital PT Goal Formulation: With patient Time For Goal Achievement: 03/30/14 Potential to Achieve Goals: Good    Frequency Min 3X/week   Barriers to discharge Inaccessible home environment;Decreased caregiver support      Co-evaluation               End of Session Equipment Utilized During Treatment: Oxygen Activity Tolerance: Patient tolerated treatment well Patient left: in bed;with call bell/phone within reach;with nursing/sitter in room;with bed alarm set (Pt was unable to get into chair today) Nurse Communication: Mobility status         Time: 7096-4383 PT Time Calculation (min) (ACUTE ONLY): 28 min   Charges:   PT Evaluation $Initial PT Evaluation Tier I: 1 Procedure PT Treatments $Therapeutic Activity: 8-22 mins   PT G Codes:        Ramond Dial 2014-03-29, 11:26 AM   Mee Hives, PT MS Acute Rehab Dept. Number: [phone]

## 2014-03-16 NOTE — Care Management Note (Signed)
  Page 2 of 2   04/04/2014     7:59:59 AM CARE MANAGEMENT NOTE 04/04/2014  Patient:  KYREE, FEDORKO   Account Number:  [id number]  Date Initiated:  03/16/2014  Documentation initiated by:  Tomi Bamberger  Subjective/Objective Assessment:   dx pna, sob,  admit- from home alone.     Action/Plan:   pt eval- rec snf, patient refusing snf.   Anticipated DC Date:  04/06/2014   Anticipated DC Plan:  SKILLED NURSING FACILITY  In-house referral  Clinical Social Worker      DC Forensic scientist  CM consult      [facility] Choice  HOME HEALTH   Choice offered to / List presented to:  C-1 Patient   DME arranged  OTHER - SEE COMMENT      DME agency  [place] arranged  HH-1 RN  [place].   Status of service:  In process, will continue to follow Medicare Important Message given?  YES (If response is "NO", the following Medicare IM given date fields will be blank) Date Medicare IM given:  04/04/2014 Medicare IM given by:  MAYO,HENRIETTA Date Additional Medicare IM given:  03/20/2014 Additional Medicare IM given by:  [facility] TAYLOR  Discharge Disposition:    Per UR Regulation:  Reviewed for med. necessity/level of care/duration of stay  If discussed at Long Length of Stay Meetings, dates discussed:    Comments:  04/04/14 [place] planned with pt and son this evening. Probable plan is SNF with palliative following as PCCM iinformed pt that it would not be possible to supply her with sufficient O2 @ home.  Son will support her decision.  03/31/14 [place] MSN BSN CCM Pt's respiratory status is not improving, pt now willing to consider SNF vs home with hospice services.  03/29/14 [place] MSN BSN CCM Oxymizer obtained - pt now on 6L/min.  PT recommending SNF but pt refusing.  03/21/14 [place] MSN BSN  CCM Transferred to [place] on NRB.  03/16/14 [place], BSN [phone] pateint lives alone, per pt eval rec snf, patient refusing snf, CSW aware.  Patient chose [facility] for hhservices for RN, PT, OT, and social work.  Referral made to [facility] , [place] notified.  Soc will begin 24-48 hrs post dc.

## 2014-03-16 NOTE — Clinical Social Work Note (Signed)
Patient refusing SNF placement. CSW will not get involved in patient's care.    Liz Beach MSW, [place], [place], [phone]

## 2014-03-16 NOTE — Progress Notes (Signed)
Subjective:    She reports improvement of her breathing and cough this morning. Her cough is now productive of white sputum. She was able to get up and sit on the side of her bed yesterday. She reports using her incentive spirometer regularly. She says that the breathing treatments have been helping when she is able to use a mask instead of just tubing.  Interval Events: -Sputum Gram stain and rare white blood cells and no organisms seen. -Saturations decreased to 88% on 4 L yesterday, but improved to 92% this morning. -Low molecular weight heparin levels are therapeutic on her home dose. -Urine Legionella antigen negative. -Continues to be afebrile, white blood cell count stable at 11.8.   Objective:    Vital Signs:   Temp:  [98.7 F (37.1 C)-99.2 F (37.3 C)] 98.7 F (37.1 C) (02/25 0533) Pulse Rate:  [82-88] 82 (02/25 0533) Resp:  [18-22] 21 (02/25 0533) BP: (104-135)/(53-79) 104/79 mmHg (02/25 0533) SpO2:  [87 %-92 %] 92 % (02/25 0533) Last BM Date: 03/13/14  24-hour weight change: Weight change:   Intake/Output:   Intake/Output Summary (Last 24 hours) at 03/16/14 1011 Last data filed at 03/16/14 0160  Gross per 24 hour  Intake    510 ml  Output    350 ml  Net    160 ml      Physical Exam: General: Morbidly obese. Resting comfortably, alert and oriented.   Lungs:  Diffuse rhonchi and wheezing, minimal improvement from prior.   Heart: RRR. 2/6 systolic ejection murmur.  Abdomen:  BS normoactive. Soft, Nondistended, non-tender.  No masses or organomegaly.  Extremities: Trace lower extremity edema.     Labs:  Basic Metabolic Panel:  Recent Labs Lab 03/13/14 1554 03/15/14 0557 03/16/14 0524  NA 140  --  138  K 3.7  --  3.4*  CL 103  --  97  CO2 29  --  31  GLUCOSE 123*  --  126*  BUN 13  --  7  CREATININE 0.63  --  0.69  CALCIUM 8.5  --  7.9*  MG  --  2.2  --   PHOS  --  2.9  --     Liver Function Tests:  Recent Labs Lab 03/13/14 1554  AST  15  ALT 11  ALKPHOS 87  BILITOT 0.4  PROT 6.5  ALBUMIN 3.4*   CBC:  Recent Labs Lab 03/13/14 1554 03/14/14 0552 03/14/14 1535 03/16/14 0524  WBC 13.1* 14.6* 10.3 11.8*  NEUTROABS 10.5* 11.8*  --   --   HGB 9.8* 9.1* 8.9* 9.0*  HCT 33.6* 31.6* 30.9* 31.1*  MCV 80.4 80.0 79.8 79.9  PLT 252 250 235 266    Cardiac Enzymes:  Recent Labs Lab 03/13/14 [place] <0.03   Microbiology: Results for orders placed or performed during the hospital encounter of 03/13/14  Culture, blood (routine x 2) Call MD if unable to obtain prior to antibiotics being given     Status: None (Preliminary result)   Collection Time: 03/13/14 10:35 PM  Result Value Ref Range Status   Specimen Description BLOOD LEFT ARM  Final   Special Requests BOTTLES DRAWN AEROBIC AND ANAEROBIC 5CC  Final   Culture   Final           BLOOD CULTURE RECEIVED NO GROWTH TO DATE CULTURE WILL BE HELD FOR 5 DAYS BEFORE ISSUING A FINAL NEGATIVE REPORT Performed at [facility]    Report Status PENDING  Incomplete  Culture, blood (routine x 2) Call MD if unable to obtain prior to antibiotics being given     Status: None (Preliminary result)   Collection Time: 03/13/14 10:40 PM  Result Value Ref Range Status   Specimen Description BLOOD RIGHT ARM  Final   Special Requests BOTTLES DRAWN AEROBIC AND ANAEROBIC 5CC  Final   Culture   Final           BLOOD CULTURE RECEIVED NO GROWTH TO DATE CULTURE WILL BE HELD FOR 5 DAYS BEFORE ISSUING A FINAL NEGATIVE REPORT Performed at [facility]    Report Status PENDING  Incomplete  MRSA PCR Screening     Status: None   Collection Time: 03/14/14  3:25 AM  Result Value Ref Range Status   MRSA by PCR NEGATIVE NEGATIVE Final    Comment:        The GeneXpert MRSA Assay (FDA approved for NASAL specimens only), is one component of a comprehensive MRSA colonization surveillance program. It is not intended to diagnose MRSA infection nor to guide or monitor  treatment for MRSA infections.   Culture, sputum-assessment     Status: None   Collection Time: 03/14/14  4:04 PM  Result Value Ref Range Status   Specimen Description SPUTUM  Final   Special Requests Normal  Final   Sputum evaluation   Final    MICROSCOPIC FINDINGS SUGGEST THAT THIS SPECIMEN IS NOT REPRESENTATIVE OF LOWER RESPIRATORY SECRETIONS. PLEASE RECOLLECT. NOTIFIED Dallie Piles 03/14/14 1722 M [place]    Report Status 03/14/2014 FINAL  Final  Culture, expectorated sputum-assessment     Status: None   Collection Time: 03/15/14 12:29 PM  Result Value Ref Range Status   Specimen Description SPUTUM  Final   Special Requests Normal  Final   Sputum evaluation   Final    THIS SPECIMEN IS ACCEPTABLE. RESPIRATORY CULTURE REPORT TO FOLLOW.   Report Status 03/15/2014 FINAL  Final  Culture, respiratory (NON-Expectorated)     Status: None (Preliminary result)   Collection Time: 03/15/14 12:29 PM  Result Value Ref Range Status   Specimen Description SPUTUM  Final   Special Requests NONE  Final   Gram Stain   Final    RARE WBC PRESENT, PREDOMINANTLY PMN RARE SQUAMOUS EPITHELIAL CELLS PRESENT NO ORGANISMS SEEN Performed at [facility]    Culture   Final    Culture reincubated for better growth Performed at [facility]    Report Status PENDING  Incomplete   Imaging: No results found.     Medications:    Infusions:    Scheduled Medications: . antiseptic oral rinse  7 mL Mouth Rinse BID  . azithromycin  500 mg Intravenous Q24H  . benzonatate  100 mg Oral TID  . cefTRIAXone (ROCEPHIN)  IV  2 g Intravenous Q24H  . enoxaparin  100 mg Subcutaneous BID  . gabapentin  100 mg Oral TID  . ipratropium-albuterol  3 mL Nebulization Once  . ipratropium-albuterol  3 mL Nebulization Q6H  . lamoTRIgine  200 mg Oral q morning - 10a  . LORazepam  1 mg Oral q morning - 10a  . LORazepam  2 mg Oral QHS  . nicotine  14 mg Transdermal QHS  . pantoprazole  40 mg Oral Daily   . pravastatin  80 mg Oral Daily  . QUEtiapine  400 mg Oral QHS  . sertraline  200 mg Oral q morning - 10a  . sodium chloride  3 mL Intravenous Q12H  .  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  She reports feeling better today, which is encouraging.  However, she has little improvement on lung exam and her saturations remain low on 4 L nasal cannula. Reviewed her pulmonary function tests which showed reduced lung volumes and decreased DLCO with mild obstructive disease. Suggests possible obesity hypoventilation syndrome, which could be confirmed with ABG as outpatient. Likely some atelectasis from immobility. -Continue ceftriaxone and azithromycin, day 3 of 7. -Follow-up sputum and blood cultures. -Continue oxygen supplementation to keep saturations greater than 92%. -Continue scheduled DuoNeb every 6 hours. -Continue Robitussin-DM every 6 hours as needed. -Continue Tessalon Perles 3 times daily. -Tylenol 500 mg every 6 hours as needed. -Incentive spirometry. -Out of bed to chair 2 times daily. -Consult PT and OT. -Discontinue telemetry.  #History of DVT and PE Low molecular weight heparin level therapeutic on current dosing. -Continue Lovenox 100 mg twice a  day.  #Acute on chronic iron deficiency anemia Hemoglobin stable. -Continue to monitor hemoglobin.  #Hypokalemia Likely related to breathing treatments. -Kdur 40 mEq once.  #Urge and stress incontinence Called and discussed with [facility]. She was started on Macrobid to treat a UTI, and she is supposed to continue Macrobid daily for 30 days until her urodynamic study to suppress bacterial growth, which could lead to false positive study. -Hold Macrobid currently as she is on ceftriaxone. -Restart Macrobid daily at discharge until her urology appointment.  #GERD -Continue home Protonix 40 mg daily.  #Chronic low back pain -Continue home Norco every 6 hours as needed.  #Depression and anxiety -Continue home Ativan 1 mg in the morning and 2 mg at night. -Continue home Lamictal, Seroquel, and Zoloft.  #Hyperlipidemia -Continue pravastatin 80 mg daily.  #Tobacco abuse Denies urge to smoke. -Nicotine patch.   DVT PPX - low molecular weight heparin  CODE STATUS - Full.  CONSULTS PLACED - None.  DISPO - Disposition is deferred at this time, awaiting improvement of hypoxia.   Anticipated discharge in approximately 2-3 day(s).   The patient does have a current PCP (KLIMA, Damita Lack, MD) and does need an [facility] hospital follow-up appointment after discharge.    Is the [facility] hospital follow-up appointment a one-time only appointment? no.  Does the patient have transportation limitations that hinder transportation to clinic appointments? yes   SERVICE NEEDED AT [place]         Y = Yes, Blank = No PT:   OT:   RN:   Equipment:   Other:      Length of Stay: 3 day(s)   Signed: Arman Filter, MD  PGY-1, Internal Medicine Resident Pager: [phone] (7AM-5PM) 03/16/2014, 10:11 AM

## 2014-03-16 NOTE — Progress Notes (Signed)
Internal Medicine Attending  Date: 03/16/2014  Patient name: [name] Medical record number: [id number] Date of birth: 08-17-1945 Age: 69 y.o. Gender: female  I saw and evaluated the patient. I reviewed the resident's note by Dr. Trudee Kuster and I agree with the resident's findings and plans as documented in his progress note.  [name] states her breathing is slightly improved today although she still has a productive cough. Examination is remarkable for diffuse inspiratory rhonchi and expiratory wheezes that is only slightly improved from admission. Given her positive clinical response we will continue with the current antibiotic regimen. I suspect she will require another 48-72 hours of inpatient care but should be stable for discharge home with her usual caregiver sometime this weekend.

## 2014-03-16 NOTE — Evaluation (Signed)
Occupational Therapy Evaluation Patient Details Name: [name] MRN: [id number] DOB: [birth year] Today's Date: 03/16/2014    History of Present Illness 69 yo female with onset of fever, cough, SOB was admitted with acute respiratory failure and CAP.     Clinical Impression   This 69 yo female admitted with above presents to acute OT with decreased mobility, decreased balance, obesity, drop in O2 sats with activity all affecting her ability to care for herself at home even with aide 5 hrs/day 7 days/week. She would benefit from a short rehab stay at SNF, but is politely refusing so I will recommend [place]. We will continue to follow.    Follow Up Recommendations  Home health OT (really would benefit from SNF, but is refusing)    Equipment Recommendations  None recommended by OT       Precautions / Restrictions Precautions Precautions: Fall Precaution Comments: needs reminders for mobility instructions Restrictions Weight Bearing Restrictions: No      Mobility Bed Mobility Overal bed mobility: Needs Assistance Bed Mobility: Supine to Sit   Supine to sit: Supervision;HOB elevated (and use of rails, I did not touch her)    Transfers Overall transfer level: Needs assistance   Transfers: Squat Pivot Transfers     Squat pivot transfers:  (I had my hands on her since I was not sure how she would do, but I did not have to help her--she placed hands on recliner in front of her and stood and turned to sit in recliner)     General transfer comment: declined to stand    Balance Overall balance assessment: Needs assistance Sitting-balance support: No upper extremity supported;Feet supported Sitting balance-Leahy Scale: Fair     Standing balance support: Bilateral upper extremity supported Standing balance-Leahy Scale: Fair                              ADL Overall ADL's : Needs assistance/impaired Eating/Feeding: Independent;Sitting   Grooming: Set  up;Sitting   Upper Body Bathing: Minimal assitance;Sitting   Lower Body Bathing: Total assistance (with min A sit to partial stand)   Upper Body Dressing : Set up   Lower Body Dressing: Total assistance (with min A sit<>stand)   Toilet Transfer:  (I had my hands on her but more as a precautionary measure, not sure how she would do)   Toileting- Clothing Manipulation and Hygiene: Total assistance (with min A sit<>stand)                         Pertinent Vitals/Pain Pain Assessment: No/denies pain Faces Pain Scale: Hurts little more Pain Location: knees Pain Intervention(s): Limited activity within patient's tolerance;Monitored during session;Repositioned     Hand Dominance Right   Extremity/Trunk Assessment Upper Extremity Assessment Upper Extremity Assessment: Generalized weakness       Communication Communication Communication: No difficulties   Cognition Arousal/Alertness: Awake/alert Behavior During Therapy: WFL for tasks assessed/performed Overall Cognitive Status: Within Functional Limits for tasks assessed                                Home Living Family/patient expects to be discharged to:: Private residence Living Arrangements: Alone Available Help at Discharge: Personal care attendant (5 hours a day 5 days a week) Type of Home: House Home Access: Stairs to enter [employer] of Steps: 2 Entrance Stairs-Rails: Left Home  Layout: One level         Bathroom Toilet: Handicapped height     Home Equipment: Walker - 4 wheels;Shower seat;Bedside commode;Walker - 2 wheels   Additional Comments: mainly uses a bariatric left platform RW      Prior Functioning/Environment Level of Independence: Needs assistance  Gait / Transfers Assistance Needed: Mod I short distances with left PFRW ADL's / Homemaking Assistance Needed: caregiver to assist--more with LBADLs than UBADLs        OT Diagnosis: Generalized weakness   OT  Problem List: Decreased strength;Obesity;Decreased activity tolerance;Cardiopulmonary status limiting activity   OT Treatment/Interventions: Self-care/ADL training;DME and/or AE instruction;Balance training;Patient/family education;Energy conservation    OT Goals(Current goals can be found in the care plan section) Acute Rehab OT Goals Patient Stated Goal: to go home not to rehab OT Goal Formulation: With patient Time For Goal Achievement: 03/30/14 Potential to Achieve Goals: Good  OT Frequency: Min 2X/week   Barriers to D/C: Decreased caregiver support             End of Session Nurse Communication:  (O2 sat dropped into 75 on RA)  Activity Tolerance:  (drop in O2 sats into 70's on RA with sit>partial stand turn from bed to recliner) Patient left: in chair;with call bell/phone within reach   Time: 1326-1405 OT Time Calculation (min): 39 min Charges:  OT General Charges $OT Visit: 1 Procedure OT Evaluation $Initial OT Evaluation Tier I: 1 Procedure OT Treatments $Self Care/Home Management : 23-37 mins   Almon Register [phone] 03/16/2014, 2:21 PM

## 2014-03-17 ENCOUNTER — Encounter (HOSPITAL_COMMUNITY): Payer: Self-pay | Admitting: Radiology

## 2014-03-17 ENCOUNTER — Inpatient Hospital Stay (HOSPITAL_COMMUNITY): Payer: Medicare Other

## 2014-03-17 DIAGNOSIS — J189 Pneumonia, unspecified organism: Secondary | ICD-10-CM

## 2014-03-17 LAB — BASIC METABOLIC PANEL
ANION GAP: 6 (ref 5–15)
BUN: 7 mg/dL (ref 6–23)
CALCIUM: 8.2 mg/dL — AB (ref 8.4–10.5)
CHLORIDE: 100 mmol/L (ref 96–112)
CO2: 34 mmol/L — AB (ref 19–32)
CREATININE: 0.73 mg/dL (ref 0.50–1.10)
GFR calc non Af Amer: 85 mL/min — ABNORMAL LOW (ref >90)
Glucose, Bld: 132 mg/dL — ABNORMAL HIGH (ref 70–99)
Potassium: 4.3 mmol/L (ref 3.5–5.1)
SODIUM: 140 mmol/L (ref 135–145)

## 2014-03-17 LAB — CBC
HEMATOCRIT: 30.7 % — AB (ref 36.0–46.0)
Hemoglobin: 8.8 g/dL — ABNORMAL LOW (ref 12.0–15.0)
MCH: 22.7 pg — AB (ref 26.0–34.0)
MCHC: 28.7 g/dL — ABNORMAL LOW (ref 30.0–36.0)
MCV: 79.3 fL (ref 78.0–100.0)
Platelets: 274 10*3/uL (ref 150–400)
RBC: 3.87 MIL/uL (ref 3.87–5.11)
RDW: 18.8 % — ABNORMAL HIGH (ref 11.5–15.5)
WBC: 11.7 10*3/uL — ABNORMAL HIGH (ref 4.0–10.5)

## 2014-03-17 LAB — CULTURE, RESPIRATORY

## 2014-03-17 LAB — CULTURE, RESPIRATORY W GRAM STAIN: Culture: NORMAL

## 2014-03-17 MED ORDER — IPRATROPIUM-ALBUTEROL 0.5-2.5 (3) MG/3ML IN SOLN
3.0000 mL | RESPIRATORY_TRACT | Status: DC
  Administered 2014-03-17 – 2014-03-18 (×6): 3 mL via RESPIRATORY_TRACT
  Filled 2014-03-17 (×7): qty 3

## 2014-03-17 MED ORDER — AZITHROMYCIN 500 MG PO TABS
500.0000 mg | ORAL_TABLET | Freq: Every day | ORAL | Status: DC
  Administered 2014-03-17 – 2014-03-18 (×2): 500 mg via ORAL
  Filled 2014-03-17 (×3): qty 1

## 2014-03-17 MED ORDER — PREDNISONE 50 MG PO TABS
60.0000 mg | ORAL_TABLET | Freq: Every day | ORAL | Status: DC
  Filled 2014-03-17 (×2): qty 1

## 2014-03-17 MED ORDER — IOHEXOL 300 MG/ML  SOLN
80.0000 mL | Freq: Once | INTRAMUSCULAR | Status: AC | PRN
  Administered 2014-03-17: 80 mL via INTRAVENOUS

## 2014-03-17 NOTE — Progress Notes (Signed)
Patient was asked if she wants something for constipation. She said she doesn't want because if she will start to have bowel movement, she will not stop. She said she will ask for medication when she will want.

## 2014-03-17 NOTE — Progress Notes (Signed)
Subjective:    She continues to report improvement of her cough and breathing, but she continues to cough up sputum. She says that her breathing treatments have been helping significantly, but she last had one around midnight. She reports getting out of bed to her bedside chair one time yesterday.  Interval Events: -Remains afebrile. -Blood pressure was elevated last night but slightly low this morning at 93/50. -Sputum culture grew normal oropharyngeal flora. -Blood cell count stable at 11.7. -Satting in the low 90s on 4 L nasal cannula, stable.    Objective:    Vital Signs:   Temp:  [98.6 F (37 C)-99.2 F (37.3 C)] 98.6 F (37 C) (02/26 0539) Pulse Rate:  [85] 85 (02/25 1332) Resp:  [18-20] 18 (02/26 0539) BP: (93-151)/(50-70) 93/50 mmHg (02/26 0539) SpO2:  [75 %-93 %] 92 % (02/26 0859) Last BM Date: 03/13/14  24-hour weight change: Weight change:   Intake/Output:   Intake/Output Summary (Last 24 hours) at 03/17/14 1018 Last data filed at 03/17/14 0942  Gross per 24 hour  Intake    120 ml  Output    800 ml  Net   -680 ml      Physical Exam: General: Morbidly obese. Resting comfortably, alert and oriented.   Lungs:  Diffuse rhonchi and wheezing, minimal improvement from prior.   Heart: RRR. 2/6 systolic ejection murmur.  Abdomen:  BS normoactive. Soft, Nondistended, non-tender.  No masses or organomegaly.  Extremities: Trace lower extremity edema.     Labs:  Basic Metabolic Panel:  Recent Labs Lab 03/13/14 1554 03/15/14 0557 03/16/14 0524 03/17/14 0542  NA 140  --  138 140  K 3.7  --  3.4* 4.3  CL 103  --  97 100  CO2 29  --  31 34*  GLUCOSE 123*  --  126* 132*  BUN 13  --  7 7  CREATININE 0.63  --  0.69 0.73  CALCIUM 8.5  --  7.9* 8.2*  MG  --  2.2  --   --   PHOS  --  2.9  --   --     Liver Function Tests:  Recent Labs Lab 03/13/14 1554  AST 15  ALT 11  ALKPHOS 87  BILITOT 0.4  PROT 6.5  ALBUMIN 3.4*   CBC:  Recent Labs Lab  03/13/14 1554 03/14/14 0552 03/14/14 1535 03/16/14 0524 03/17/14 0542  WBC 13.1* 14.6* 10.3 11.8* 11.7*  NEUTROABS 10.5* 11.8*  --   --   --   HGB 9.8* 9.1* 8.9* 9.0* 8.8*  HCT 33.6* 31.6* 30.9* 31.1* 30.7*  MCV 80.4 80.0 79.8 79.9 79.3  PLT 252 250 235 266 274    Cardiac Enzymes:  Recent Labs Lab 03/13/14 [place] <0.03   Microbiology: Results for orders placed or performed during the hospital encounter of 03/13/14  Culture, blood (routine x 2) Call MD if unable to obtain prior to antibiotics being given     Status: None (Preliminary result)   Collection Time: 03/13/14 10:35 PM  Result Value Ref Range Status   Specimen Description BLOOD LEFT ARM  Final   Special Requests BOTTLES DRAWN AEROBIC AND ANAEROBIC 5CC  Final   Culture   Final           BLOOD CULTURE RECEIVED NO GROWTH TO DATE CULTURE WILL BE HELD FOR 5 DAYS BEFORE ISSUING A FINAL NEGATIVE REPORT Performed at [facility]    Report Status PENDING  Incomplete  Culture, blood (  routine x 2) Call MD if unable to obtain prior to antibiotics being given     Status: None (Preliminary result)   Collection Time: 03/13/14 10:40 PM  Result Value Ref Range Status   Specimen Description BLOOD RIGHT ARM  Final   Special Requests BOTTLES DRAWN AEROBIC AND ANAEROBIC 5CC  Final   Culture   Final           BLOOD CULTURE RECEIVED NO GROWTH TO DATE CULTURE WILL BE HELD FOR 5 DAYS BEFORE ISSUING A FINAL NEGATIVE REPORT Performed at [facility]    Report Status PENDING  Incomplete  MRSA PCR Screening     Status: None   Collection Time: 03/14/14  3:25 AM  Result Value Ref Range Status   MRSA by PCR NEGATIVE NEGATIVE Final    Comment:        The GeneXpert MRSA Assay (FDA approved for NASAL specimens only), is one component of a comprehensive MRSA colonization surveillance program. It is not intended to diagnose MRSA infection nor to guide or monitor treatment for MRSA infections.   Culture,  sputum-assessment     Status: None   Collection Time: 03/14/14  4:04 PM  Result Value Ref Range Status   Specimen Description SPUTUM  Final   Special Requests Normal  Final   Sputum evaluation   Final    MICROSCOPIC FINDINGS SUGGEST THAT THIS SPECIMEN IS NOT REPRESENTATIVE OF LOWER RESPIRATORY SECRETIONS. PLEASE RECOLLECT. NOTIFIED Dallie Piles 03/14/14 1722 M [facility]    Report Status 03/14/2014 FINAL  Final  Culture, expectorated sputum-assessment     Status: None   Collection Time: 03/15/14 12:29 PM  Result Value Ref Range Status   Specimen Description SPUTUM  Final   Special Requests Normal  Final   Sputum evaluation   Final    THIS SPECIMEN IS ACCEPTABLE. RESPIRATORY CULTURE REPORT TO FOLLOW.   Report Status 03/15/2014 FINAL  Final  Culture, respiratory (NON-Expectorated)     Status: None   Collection Time: 03/15/14 12:29 PM  Result Value Ref Range Status   Specimen Description SPUTUM  Final   Special Requests NONE  Final   Gram Stain   Final    RARE WBC PRESENT, PREDOMINANTLY PMN RARE SQUAMOUS EPITHELIAL CELLS PRESENT NO ORGANISMS SEEN Performed at [facility]    Culture   Final    NORMAL OROPHARYNGEAL FLORA Performed at [facility]    Report Status 03/17/2014 FINAL  Final   Imaging: No results found.     Medications:    Infusions:    Scheduled Medications: . antiseptic oral rinse  7 mL Mouth Rinse BID  . azithromycin  500 mg Oral QHS  . benzonatate  100 mg Oral TID  . cefTRIAXone (ROCEPHIN)  IV  2 g Intravenous Q24H  . enoxaparin  100 mg Subcutaneous BID  . gabapentin  100 mg Oral TID  . ipratropium-albuterol  3 mL Nebulization Q4H  . lamoTRIgine  200 mg Oral q morning - 10a  . LORazepam  1 mg Oral q morning - 10a  . LORazepam  2 mg Oral QHS  . nicotine  14 mg Transdermal QHS  . pantoprazole  40 mg Oral Daily  . pravastatin  80 mg Oral Daily  . QUEtiapine  400 mg Oral QHS  . sertraline  200 mg Oral q morning - 10a  . sodium chloride   3 mL Intravenous Q12H  . 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 No growth on sputum culture or blood cultures. This could represent a viral pneumonia, but will likely take some time to resolve given her comorbidities. PT and OT recommending skilled nursing facility, but she wants to go home with her home caregiver. -Continue ceftriaxone and azithromycin, day 4 of 7. -Continue oxygen supplementation to keep saturations greater than 92%. -Increase scheduled DuoNeb to every 4 hours. -Continue Robitussin-DM every 6 hours as needed. -Continue Tessalon Perles 3 times daily. -Tylenol 500 mg every 6 hours as needed. -Incentive spirometry. -Out of bed to chair 2 times daily.  #History of DVT and PE Low molecular weight heparin level therapeutic on current dosing. -Continue Lovenox 100 mg twice a day.  #Acute on chronic iron deficiency anemia Hemoglobin remained stable with no signs of bleeding. -Continue to monitor hemoglobin and hold iron in the setting of infection.  #Urge and stress incontinence -Hold Macrobid currently as she is on ceftriaxone. -Restart Macrobid daily at  discharge until her urology appointment.  #GERD -Continue home Protonix 40 mg daily.  #Chronic low back pain -Continue home Norco every 6 hours as needed.  #Depression and anxiety -Continue home Ativan 1 mg in the morning and 2 mg at night. -Continue home Lamictal, Seroquel, and Zoloft.  #Hyperlipidemia -Continue pravastatin 80 mg daily.  #Tobacco abuse -Nicotine patch.   DVT PPX - low molecular weight heparin  CODE STATUS - Full.  CONSULTS PLACED - None.  DISPO - Disposition is deferred at this time, awaiting improvement of hypoxia.   Anticipated discharge in approximately 2-3 day(s).   The patient does have a current PCP (KLIMA, Damita Lack, MD) and does need an [facility] hospital follow-up appointment after discharge.    Is the [facility] hospital follow-up appointment a one-time only appointment? no.  Does the patient have transportation limitations that hinder transportation to clinic appointments? yes   SERVICE NEEDED AT [place]         Y = Yes, Blank = No PT:   OT:   RN:   Equipment:   Other:      Length of Stay: 4 day(s)   Signed: Arman Filter, MD  PGY-1, Internal Medicine Resident Pager: [phone] (7AM-5PM) 03/17/2014, 10:18 AM

## 2014-03-18 DIAGNOSIS — R161 Splenomegaly, not elsewhere classified: Secondary | ICD-10-CM

## 2014-03-18 DIAGNOSIS — J672 Bird fancier's lung: Secondary | ICD-10-CM | POA: Insufficient documentation

## 2014-03-18 DIAGNOSIS — J679 Hypersensitivity pneumonitis due to unspecified organic dust: Secondary | ICD-10-CM

## 2014-03-18 LAB — CBC WITH DIFFERENTIAL/PLATELET
Basophils Absolute: 0.1 10*3/uL (ref 0.0–0.1)
Basophils Relative: 1 % (ref 0–1)
Eosinophils Absolute: 0.5 10*3/uL (ref 0.0–0.7)
Eosinophils Relative: 4 % (ref 0–5)
HEMATOCRIT: 30.2 % — AB (ref 36.0–46.0)
Hemoglobin: 8.8 g/dL — ABNORMAL LOW (ref 12.0–15.0)
Lymphocytes Relative: 15 % (ref 12–46)
Lymphs Abs: 1.7 10*3/uL (ref 0.7–4.0)
MCH: 23 pg — AB (ref 26.0–34.0)
MCHC: 29.1 g/dL — ABNORMAL LOW (ref 30.0–36.0)
MCV: 78.9 fL (ref 78.0–100.0)
Monocytes Absolute: 0.7 10*3/uL (ref 0.1–1.0)
Monocytes Relative: 6 % (ref 3–12)
Neutro Abs: 8.5 10*3/uL — ABNORMAL HIGH (ref 1.7–7.7)
Neutrophils Relative %: 74 % (ref 43–77)
Platelets: 274 10*3/uL (ref 150–400)
RBC: 3.83 MIL/uL — ABNORMAL LOW (ref 3.87–5.11)
RDW: 18.8 % — AB (ref 11.5–15.5)
WBC: 11.5 10*3/uL — ABNORMAL HIGH (ref 4.0–10.5)

## 2014-03-18 LAB — BASIC METABOLIC PANEL
Anion gap: 7 (ref 5–15)
BUN: 9 mg/dL (ref 6–23)
CALCIUM: 8 mg/dL — AB (ref 8.4–10.5)
CHLORIDE: 102 mmol/L (ref 96–112)
CO2: 29 mmol/L (ref 19–32)
CREATININE: 0.69 mg/dL (ref 0.50–1.10)
GFR calc non Af Amer: 87 mL/min — ABNORMAL LOW (ref >90)
Glucose, Bld: 119 mg/dL — ABNORMAL HIGH (ref 70–99)
Potassium: 4 mmol/L (ref 3.5–5.1)
Sodium: 138 mmol/L (ref 135–145)

## 2014-03-18 LAB — TECHNOLOGIST SMEAR REVIEW

## 2014-03-18 LAB — PROCALCITONIN: Procalcitonin: <0.1 ng/mL

## 2014-03-18 MED ORDER — POLYETHYLENE GLYCOL 3350 17 G PO PACK
17.0000 g | PACK | Freq: Every day | ORAL | Status: DC | PRN
  Administered 2014-03-29 – 2014-04-02 (×2): 17 g via ORAL
  Filled 2014-03-18 (×3): qty 1

## 2014-03-18 MED ORDER — IPRATROPIUM-ALBUTEROL 0.5-2.5 (3) MG/3ML IN SOLN: 3.0000 mL | RESPIRATORY_TRACT | Status: DC | PRN

## 2014-03-18 MED ORDER — SENNOSIDES-DOCUSATE SODIUM 8.6-50 MG PO TABS
1.0000 | ORAL_TABLET | Freq: Two times a day (BID) | ORAL | Status: DC | PRN
  Administered 2014-04-02: 1 via ORAL
  Filled 2014-03-18: qty 1

## 2014-03-18 MED ORDER — PREDNISONE 20 MG PO TABS
40.0000 mg | ORAL_TABLET | Freq: Once | ORAL | Status: AC
  Administered 2014-03-18: 40 mg via ORAL
  Filled 2014-03-18: qty 2

## 2014-03-18 NOTE — Progress Notes (Signed)
Internal Medicine Attending  Date: 03/18/2014  Patient name: [name] Medical record number: [id number] Date of birth: 26-Mar-1945 Age: 69 y.o. Gender: female  I saw and evaluated the patient. I reviewed the resident's note by Dr. Naaman Plummer and I agree with the resident's findings and plans as documented in her progress note.  The patient's recovery from presumed pneumonia has been slow and this prompted a CT scan of the thorax yesterday. This demonstrated diffuse bilateral groundglass changes with some adenopathy. The possibility of hypersensitivity pneumonitis was raised and we specifically asked the patient if she had any birds. She stated she has had a Dove for a long time. The [place] lives in a cage in the corner of the living room and the cage is cleaned by her aide, not by his [place]. Nonetheless, this raises the possibility of an acute on chronic hypersensitivity pneumonitis secondary to bird fanciing. I agree with Dr. Harley Hallmark plan of starting a steroid burst with taper and obtaining a hypersensitivity pneumonitis panel. We will assess her response to this therapy, but likely recommend she get rid of the [facility] immediately. When the subject was breached she had no problem with the concept of getting rid of the bird if we felt it might help preserve her health.

## 2014-03-18 NOTE — Progress Notes (Signed)
Subjective:  Pt seen and examined in AM. No acute events overnight. She reports no improvement in her breathing and continues to have cough and wheezing. She does not feel that she has improved much since admission and is not at her baseline respiratory status. She is currently on 3 L of oxygen via [place]. 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 She reports having 1 dove bird at home which she has had for the past 10 years.      Objective: Vital signs in last 24 hours: Filed Vitals:   03/18/14 0015 03/18/14 0405 03/18/14 0558 03/18/14 0839  BP:   106/42   Pulse:   79   Temp:   99.1 F (37.3 C)   TempSrc:   Oral   Resp:   18   Height:      Weight:      SpO2: 94% 95% 93% 91%   Weight change:   Intake/Output Summary (Last 24 hours) at 03/18/14 0933 Last data filed at 03/17/14 2043  Gross per 24 hour  Intake    710 ml  Output    300 ml  Net    410 ml   PHYSICAL EXAMINATION:   General: Morbidly Obese. On 3L O2 via [place] Lungs: Diffuse rhonchi & wheezing  Heart: Regular rate and rhythm, 2/6 systolic murmur  Abdomen:  Normal BS, non-tender, non-distended, splenomegaly Extremities: Trace nonpitting b/l LE edema   Lab Results: Basic Metabolic Panel:  Recent Labs Lab 03/15/14 0557  03/17/14 0542 03/18/14 0613  NA  --   < > 140 138  K  --   < > 4.3 4.0  CL  --   < > 100 102  CO2  --   < > 34* 29  GLUCOSE  --   < > 132* 119*  BUN  --   < > 7 9  CREATININE  --   < > 0.73 0.69  CALCIUM  --   < > 8.2* 8.0*  MG 2.2  --   --   --   PHOS 2.9  --   --   --   < > = values in this interval not displayed. Liver Function Tests:  Recent Labs Lab 03/13/14 1554  AST 15  ALT 11  ALKPHOS 87  BILITOT 0.4  PROT 6.5  ALBUMIN 3.4*   No results for input(s): LIPASE, AMYLASE in the last 168 hours. No results for input(s): AMMONIA in the last 168 hours. CBC:  Recent Labs Lab  03/14/14 0552  03/17/14 0542 03/18/14 0613  WBC 14.6*  < > 11.7* 11.5*  NEUTROABS 11.8*  --   --  8.5*  HGB 9.1*  < > 8.8* 8.8*  HCT 31.6*  < > 30.7* 30.2*  MCV 80.0  < > 79.3 78.9  PLT 250  < > 274 274  < > = values in this interval not displayed. Cardiac Enzymes:  Recent Labs Lab 03/13/14 1554  TROPONINI <0.03   Anemia Panel:  Recent Labs Lab 03/14/14 0552  FERRITIN 26  TIBC 341  IRON 13*   Urine Drug Screen: Drugs of Abuse     Component Value Date/Time   LABOPIA POSITIVE* 03/25/2008 2325   COCAINSCRNUR NONE DETECTED 03/25/2008 2325   LABBENZ POSITIVE* 03/25/2008 2325   AMPHETMU NONE DETECTED 03/25/2008 2325   THCU NONE DETECTED 03/25/2008 2325   LABBARB  03/25/2008 2325    NONE DETECTED  DRUG SCREEN FOR MEDICAL PURPOSES ONLY.  IF CONFIRMATION IS NEEDED FOR ANY PURPOSE, NOTIFY LAB WITHIN 5 DAYS.        LOWEST DETECTABLE LIMITS FOR URINE DRUG SCREEN Drug Class       Cutoff (ng/mL) Amphetamine      1000 Barbiturate      200 Benzodiazepine   811 Tricyclics       914 Opiates          300 Cocaine          300 THC              50    Alcohol Level: No results for input(s): ETH in the last 168 hours. Urinalysis: No results for input(s): COLORURINE, LABSPEC, PHURINE, GLUCOSEU, HGBUR, BILIRUBINUR, KETONESUR, PROTEINUR, UROBILINOGEN, NITRITE, LEUKOCYTESUR in the last 168 hours.  Invalid input(s): APPERANCEUR   Micro Results: Recent Results (from the past 240 hour(s))  Culture, blood (routine x 2) Call MD if unable to obtain prior to antibiotics being given     Status: None (Preliminary result)   Collection Time: 03/13/14 10:35 PM  Result Value Ref Range Status   Specimen Description BLOOD LEFT ARM  Final   Special Requests BOTTLES DRAWN AEROBIC AND ANAEROBIC 5CC  Final   Culture   Final           BLOOD CULTURE RECEIVED NO GROWTH TO DATE CULTURE WILL BE HELD FOR 5 DAYS BEFORE ISSUING A FINAL NEGATIVE REPORT Performed at [facility]     Report Status PENDING  Incomplete  Culture, blood (routine x 2) Call MD if unable to obtain prior to antibiotics being given     Status: None (Preliminary result)   Collection Time: 03/13/14 10:40 PM  Result Value Ref Range Status   Specimen Description BLOOD RIGHT ARM  Final   Special Requests BOTTLES DRAWN AEROBIC AND ANAEROBIC 5CC  Final   Culture   Final           BLOOD CULTURE RECEIVED NO GROWTH TO DATE CULTURE WILL BE HELD FOR 5 DAYS BEFORE ISSUING A FINAL NEGATIVE REPORT Performed at [facility]    Report Status PENDING  Incomplete  MRSA PCR Screening     Status: None   Collection Time: 03/14/14  3:25 AM  Result Value Ref Range Status   MRSA by PCR NEGATIVE NEGATIVE Final    Comment:        The GeneXpert MRSA Assay (FDA approved for NASAL specimens only), is one component of a comprehensive MRSA colonization surveillance program. It is not intended to diagnose MRSA infection nor to guide or monitor treatment for MRSA infections.   Culture, sputum-assessment     Status: None   Collection Time: 03/14/14  4:04 PM  Result Value Ref Range Status   Specimen Description SPUTUM  Final   Special Requests Normal  Final   Sputum evaluation   Final    MICROSCOPIC FINDINGS SUGGEST THAT THIS SPECIMEN IS NOT REPRESENTATIVE OF LOWER RESPIRATORY SECRETIONS. PLEASE RECOLLECT. NOTIFIED Dallie Piles 03/14/14 1722 M [facility]    Report Status 03/14/2014 FINAL  Final  Culture, expectorated sputum-assessment     Status: None   Collection Time: 03/15/14 12:29 PM  Result Value Ref Range Status   Specimen Description SPUTUM  Final   Special Requests Normal  Final   Sputum evaluation   Final    THIS SPECIMEN IS ACCEPTABLE. RESPIRATORY CULTURE REPORT TO FOLLOW.   Report Status 03/15/2014 FINAL  Final  Culture, respiratory (  NON-Expectorated)     Status: None   Collection Time: 03/15/14 12:29 PM  Result Value Ref Range Status   Specimen Description SPUTUM  Final   Special Requests NONE   Final   Gram Stain   Final    RARE WBC PRESENT, PREDOMINANTLY PMN RARE SQUAMOUS EPITHELIAL CELLS PRESENT NO ORGANISMS SEEN Performed at [facility]    Culture   Final    NORMAL OROPHARYNGEAL FLORA Performed at [facility]    Report Status 03/17/2014 FINAL  Final   Studies/Results: Ct Chest W Contrast  03/17/2014   CLINICAL DATA:  69 year old female admitted on March 13, 2014 to the hospital with cough, dyspnea and hypoxia, with clinical evidence of community acquired pneumonia.  EXAM: CT CHEST WITH CONTRAST  TECHNIQUE: Multidetector CT imaging of the chest was performed during intravenous contrast administration.  CONTRAST:  35mL OMNIPAQUE IOHEXOL 300 MG/ML  SOLN  COMPARISON:  Chest CT 02/01/2013.  FINDINGS: Mediastinum/Lymph Nodes: Numerous enlarged mediastinal and hilar lymph nodes are noted, most of which appear to enhance more avidly than typically seen. The largest of these include a 2 cm short axis prevascular lymph node, 1.6 cm short axis high right paratracheal lymph node, 1.9 cm short axis subcarinal lymph node, and 1.7 cm left hilar lymph node. Extent of lymphadenopathy is slightly increased compared to prior examination 02/01/2013. Heart size is borderline enlarged. There is no significant pericardial fluid, thickening or pericardial calcification. Esophagus is unremarkable in appearance. No axillary lymphadenopathy.  Lungs/Pleura: Widespread interlobular septal thickening in the lungs bilaterally, with widespread thickening of the peribronchovascular interstitium. In addition, there are patchy areas of ground-glass attenuation with both inter- and intralobular septal thickening, demonstrating a "crazy paving" appearance. These findings in particular are asymmetric, with greater involvement in the right lung than the left, and greater involvement in the apices relative to the bases. A few thick walled cysts are also noted in the upper lungs. Areas of dependent  subsegmental atelectasis are noted in the lower lobes of the lungs bilaterally. Trace right pleural effusion.  Upper Abdomen: Calcified gallstone in the gallbladder. Spleen is incompletely visualized, but appears enlarged measuring up to 17.8 x 4.6 cm on axial images.  Musculoskeletal/Soft Tissues: Postoperative changes of bilateral subpectoral breast implants noted. The left breast implant is completely collapsed underneath the left pectoralis major musculature. There are no aggressive appearing lytic or blastic lesions noted in the visualized portions of the skeleton. Orthopedic fixation hardware in the lower cervical spine incidentally noted.  IMPRESSION: 1. The appearance of the chest is very unusual, as detailed above. While there is definite evidence of interstitial lung disease, which appears progressive compared to the prior examination, how much of today's findings is related to an acute process versus chronic interstitial lung disease is uncertain. The pattern of interstitial lung findings is favored to reflect a chronic hypersensitivity pneumonitis. The possibility of superimposed acute interstitial pneumonia (AIP) is not excluded. Some of the findings on today's examination could simply be related to pulmonary edema, however, given the asymmetry of findings (right greater than left and apical greater than basal), superimposed atypical infection is also not excluded. A followup nonemergent high-resolution chest CT is recommended in the next several months after resolution of the patient's acute illness to better evaluate the interstitial lung findings. 2. Progressively increasing adenopathy in the thorax, and worsening splenomegaly. This could suggest underlying lymphoproliferative disorder. Notably, the mediastinal adenopathy is also remarkable for relatively avid enhancement of the lymph nodes. This is of uncertain  etiology and significance, but can be seen in the setting of Castleman's disease.  Although that is not a strongly favored diagnosis, clinical correlation is suggested. 3. Additional incidental findings, as above.   Electronically Signed   By: Vinnie Langton M.D.   On: 03/17/2014 14:12   Medications: I have reviewed the patient's current medications. Scheduled Meds: . antiseptic oral rinse  7 mL Mouth Rinse BID  . azithromycin  500 mg Oral QHS  . benzonatate  100 mg Oral TID  . cefTRIAXone (ROCEPHIN)  IV  2 g Intravenous Q24H  . enoxaparin  100 mg Subcutaneous BID  . gabapentin  100 mg Oral TID  . ipratropium-albuterol  3 mL Nebulization Q4H  . lamoTRIgine  200 mg Oral q morning - 10a  . LORazepam  1 mg Oral q morning - 10a  . LORazepam  2 mg Oral QHS  . nicotine  14 mg Transdermal QHS  . pantoprazole  40 mg Oral Daily  . pravastatin  80 mg Oral Daily  . predniSONE  40 mg Oral Once  . QUEtiapine  400 mg Oral QHS  . sertraline  200 mg Oral q morning - 10a  . sodium chloride  3 mL Intravenous Q12H  . 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 Pt has been afebrile with negative sputum and blood cultures, thus less likely bacterial pneumonia. 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 Pt reports having a bird (dove) at home for the past 10 years which could suggest possible bird fancier's lung. -Start prednisone 40 mg daily for 1-2 weeks then taper over the next 2-4 weeks  -Obtain hypersensitivity panel  -Continue ceftriaxone and azithromycin, day 5 of 7, will obtain PCT level, consider discontinuing if PCT low   -Continue oxygen supplementation to keep saturations greater than 92%. -Change scheduled DuoNeb from every 4 hours to PRN and continue albuterol nebulizer Q 2 hr PRN -Continue Robitussin-DM every 6 hours as needed. -Continue Tessalon 3 times daily. -Tylenol 500 mg every 6 hours as needed. -Incentive spirometry. -Out of bed to chair 2 times daily. -Needs follow-up high resolution chest CT in few months   #Worsening Bilateral Hilar/Mediastinal Lymphadenopathy & Splenomegaly - CT chest with progressive mediastinal and hilar adenopathy and enlarging splenomegaly. Etiology unclear, bird fancier's lung vs sarcoidosis vs  lymphoproliferative disease vs Castleman's disease.   -Obtain smear review ---> polychromasia  -Obtain ACE level  -Consider IR guided lymph node biopsy   #History of DVT and PE Low molecular weight heparin level therapeutic on current dosing. -Continue Lovenox 100 mg twice a day.  #Acute on chronic iron deficiency anemia Hemoglobin remains stable at baseline with no signs of bleeding. -Continue to monitor hemoglobin and hold iron in the setting  of infection.  #Urge and stress incontinence -Hold Macrobid currently as she is on ceftriaxone. -Restart Macrobid daily at discharge until her urology appointment.  #Constipation - Reports recent constipation since hospitalization  -Start miralax bedtime PRN -Start senokot-S BID PRN  #GERD -Continue home Protonix 40 mg daily.  #Chronic low back pain -Continue home Norco every 6 hours as needed.  #Depression and  anxiety -Continue home Ativan 1 mg in the morning and 2 mg at night. -Continue home Lamictal, Seroquel, and Zoloft.  #Hyperlipidemia -Continue pravastatin 80 mg daily.  #Tobacco abuse -Nicotine patch.    Dispo: Disposition is deferred at this time, awaiting improvement of current medical problems.    The patient does have a current PCP Karren Cobble, MD) and does need an [facility] hospital follow-up appointment after discharge.  The patient does have transportation limitations that hinder transportation to clinic appointments.  .Services Needed at time of discharge: Y = Yes, Blank = No PT:   OT:   RN:   Equipment:   Other:     LOS: 5 days   Juluis Mire, MD 03/18/2014, 9:33 AM

## 2014-03-19 LAB — CBC
HCT: 31.9 % — ABNORMAL LOW (ref 36.0–46.0)
Hemoglobin: 9.1 g/dL — ABNORMAL LOW (ref 12.0–15.0)
MCH: 22.5 pg — ABNORMAL LOW (ref 26.0–34.0)
MCHC: 28.5 g/dL — AB (ref 30.0–36.0)
MCV: 79 fL (ref 78.0–100.0)
Platelets: 302 10*3/uL (ref 150–400)
RBC: 4.04 MIL/uL (ref 3.87–5.11)
RDW: 18.4 % — ABNORMAL HIGH (ref 11.5–15.5)
WBC: 13.3 10*3/uL — AB (ref 4.0–10.5)

## 2014-03-19 MED ORDER — NITROFURANTOIN MONOHYD MACRO 100 MG PO CAPS
100.0000 mg | ORAL_CAPSULE | Freq: Every day | ORAL | Status: DC
Start: 2014-03-19 — End: 2014-03-20
  Administered 2014-03-19: 100 mg via ORAL
  Filled 2014-03-19 (×2): qty 1

## 2014-03-19 MED ORDER — FERROUS SULFATE 325 (65 FE) MG PO TABS
325.0000 mg | ORAL_TABLET | Freq: Three times a day (TID) | ORAL | Status: DC
  Administered 2014-03-19 – 2014-04-06 (×53): 325 mg via ORAL
  Filled 2014-03-19 (×59): qty 1

## 2014-03-19 NOTE — Progress Notes (Signed)
SATURATION QUALIFICATIONS: (This note is used to comply with regulatory documentation for home oxygen)  Patient Saturations on Room Air at Rest = 76%  Patient Saturations on Room Air while Ambulating = n/a  Patient Saturations on 4 Liters of oxygen at rest = 91%  Please briefly explain why patient needs home oxygen:

## 2014-03-19 NOTE — Progress Notes (Signed)
Subjective:    She that her breathing is stable from yesterday. She continues to have some improvement in her productive cough, but continues to have wheezing and shortness of breath. She has been contemplating getting rid of her bird at home, and she is willing to make this decision to help with her health.  Interval Events: -Satting well on 2 L yesterday, but down to 87% on 4L nasal cannula this morning. -Vital signs otherwise stable. -Pro-calcitonin less than 0.1.    Objective:    Vital Signs:   Temp:  [98.8 F (37.1 C)-99.3 F (37.4 C)] 98.8 F (37.1 C) (02/27 2152) Pulse Rate:  [77-80] 77 (02/28 0646) Resp:  [17-21] 20 (02/28 0646) BP: (136-138)/(49-59) 138/52 mmHg (02/28 0646) SpO2:  [87 %-94 %] 87 % (02/28 0646) Last BM Date: 03/18/14  24-hour weight change: Weight change:   Intake/Output:   Intake/Output Summary (Last 24 hours) at 03/19/14 0833 Last data filed at 03/19/14 0824  Gross per 24 hour  Intake    600 ml  Output      0 ml  Net    600 ml      Physical Exam: General: Morbidly obese. Resting comfortably, alert and oriented.   Lungs:  Diffuse rhonchi and wheezing, minimal improvement from prior.   Heart: RRR. 2/6 systolic ejection murmur.  Abdomen:  BS normoactive. Soft, Nondistended, non-tender.  No masses or organomegaly.  Extremities: Trace lower extremity edema.     Labs:  Basic Metabolic Panel:  Recent Labs Lab 03/13/14 1554 03/15/14 0557 03/16/14 0524 03/17/14 0542 03/18/14 0613  NA 140  --  138 140 138  K 3.7  --  3.4* 4.3 4.0  CL 103  --  97 100 102  CO2 29  --  31 34* 29  GLUCOSE 123*  --  126* 132* 119*  BUN 13  --  7 7 9   CREATININE 0.63  --  0.69 0.73 0.69  CALCIUM 8.5  --  7.9* 8.2* 8.0*  MG  --  2.2  --   --   --   PHOS  --  2.9  --   --   --     Liver Function Tests:  Recent Labs Lab 03/13/14 1554  AST 15  ALT 11  ALKPHOS 87  BILITOT 0.4  PROT 6.5  ALBUMIN 3.4*   CBC:  Recent Labs Lab 03/13/14 1554  03/14/14 0552 03/14/14 1535 03/16/14 0524 03/17/14 0542 03/18/14 0613  WBC 13.1* 14.6* 10.3 11.8* 11.7* 11.5*  NEUTROABS 10.5* 11.8*  --   --   --  8.5*  HGB 9.8* 9.1* 8.9* 9.0* 8.8* 8.8*  HCT 33.6* 31.6* 30.9* 31.1* 30.7* 30.2*  MCV 80.4 80.0 79.8 79.9 79.3 78.9  PLT 252 250 235 266 274 274    Cardiac Enzymes:  Recent Labs Lab 03/13/14 1554  [place] <0.03   Microbiology: Results for orders placed or performed during the hospital encounter of 03/13/14  Culture, blood (routine x 2) Call MD if unable to obtain prior to antibiotics being given     Status: None (Preliminary result)   Collection Time: 03/13/14 10:35 PM  Result Value Ref Range Status   Specimen Description BLOOD LEFT ARM  Final   Special Requests BOTTLES DRAWN AEROBIC AND ANAEROBIC 5CC  Final   Culture   Final           BLOOD CULTURE RECEIVED NO GROWTH TO DATE CULTURE WILL BE HELD FOR 5 DAYS BEFORE ISSUING A FINAL NEGATIVE  REPORT Performed at [facility]    Report Status PENDING  Incomplete  Culture, blood (routine x 2) Call MD if unable to obtain prior to antibiotics being given     Status: None (Preliminary result)   Collection Time: 03/13/14 10:40 PM  Result Value Ref Range Status   Specimen Description BLOOD RIGHT ARM  Final   Special Requests BOTTLES DRAWN AEROBIC AND ANAEROBIC 5CC  Final   Culture   Final           BLOOD CULTURE RECEIVED NO GROWTH TO DATE CULTURE WILL BE HELD FOR 5 DAYS BEFORE ISSUING A FINAL NEGATIVE REPORT Performed at [facility]    Report Status PENDING  Incomplete  MRSA PCR Screening     Status: None   Collection Time: 03/14/14  3:25 AM  Result Value Ref Range Status   MRSA by PCR NEGATIVE NEGATIVE Final    Comment:        The GeneXpert MRSA Assay (FDA approved for NASAL specimens only), is one component of a comprehensive MRSA colonization surveillance program. It is not intended to diagnose MRSA infection nor to guide or monitor treatment for MRSA  infections.   Culture, sputum-assessment     Status: None   Collection Time: 03/14/14  4:04 PM  Result Value Ref Range Status   Specimen Description SPUTUM  Final   Special Requests Normal  Final   Sputum evaluation   Final    MICROSCOPIC FINDINGS SUGGEST THAT THIS SPECIMEN IS NOT REPRESENTATIVE OF LOWER RESPIRATORY SECRETIONS. PLEASE RECOLLECT. NOTIFIED Dallie Piles 03/14/14 1722 M [facility]    Report Status 03/14/2014 FINAL  Final  Culture, expectorated sputum-assessment     Status: None   Collection Time: 03/15/14 12:29 PM  Result Value Ref Range Status   Specimen Description SPUTUM  Final   Special Requests Normal  Final   Sputum evaluation   Final    THIS SPECIMEN IS ACCEPTABLE. RESPIRATORY CULTURE REPORT TO FOLLOW.   Report Status 03/15/2014 FINAL  Final  Culture, respiratory (NON-Expectorated)     Status: None   Collection Time: 03/15/14 12:29 PM  Result Value Ref Range Status   Specimen Description SPUTUM  Final   Special Requests NONE  Final   Gram Stain   Final    RARE WBC PRESENT, PREDOMINANTLY PMN RARE SQUAMOUS EPITHELIAL CELLS PRESENT NO ORGANISMS SEEN Performed at [facility]    Culture   Final    NORMAL OROPHARYNGEAL FLORA Performed at [facility]    Report Status 03/17/2014 FINAL  Final   Imaging: Ct Chest W Contrast  03/17/2014   CLINICAL DATA:  69 year old female admitted on March 13, 2014 to the hospital with cough, dyspnea and hypoxia, with clinical evidence of community acquired pneumonia.  EXAM: CT CHEST WITH CONTRAST  TECHNIQUE: Multidetector CT imaging of the chest was performed during intravenous contrast administration.  CONTRAST:  6mL OMNIPAQUE IOHEXOL 300 MG/ML  SOLN  COMPARISON:  Chest CT 02/01/2013.  FINDINGS: Mediastinum/Lymph Nodes: Numerous enlarged mediastinal and hilar lymph nodes are noted, most of which appear to enhance more avidly than typically seen. The largest of these include a 2 cm short axis prevascular lymph node,  1.6 cm short axis high right paratracheal lymph node, 1.9 cm short axis subcarinal lymph node, and 1.7 cm left hilar lymph node. Extent of lymphadenopathy is slightly increased compared to prior examination 02/01/2013. Heart size is borderline enlarged. There is no significant pericardial fluid, thickening or pericardial calcification. Esophagus is unremarkable  in appearance. No axillary lymphadenopathy.  Lungs/Pleura: Widespread interlobular septal thickening in the lungs bilaterally, with widespread thickening of the peribronchovascular interstitium. In addition, there are patchy areas of ground-glass attenuation with both inter- and intralobular septal thickening, demonstrating a "crazy paving" appearance. These findings in particular are asymmetric, with greater involvement in the right lung than the left, and greater involvement in the apices relative to the bases. A few thick walled cysts are also noted in the upper lungs. Areas of dependent subsegmental atelectasis are noted in the lower lobes of the lungs bilaterally. Trace right pleural effusion.  Upper Abdomen: Calcified gallstone in the gallbladder. Spleen is incompletely visualized, but appears enlarged measuring up to 17.8 x 4.6 cm on axial images.  Musculoskeletal/Soft Tissues: Postoperative changes of bilateral subpectoral breast implants noted. The left breast implant is completely collapsed underneath the left pectoralis major musculature. There are no aggressive appearing lytic or blastic lesions noted in the visualized portions of the skeleton. Orthopedic fixation hardware in the lower cervical spine incidentally noted.  IMPRESSION: 1. The appearance of the chest is very unusual, as detailed above. While there is definite evidence of interstitial lung disease, which appears progressive compared to the prior examination, how much of today's findings is related to an acute process versus chronic interstitial lung disease is uncertain. The pattern of  interstitial lung findings is favored to reflect a chronic hypersensitivity pneumonitis. The possibility of superimposed acute interstitial pneumonia (AIP) is not excluded. Some of the findings on today's examination could simply be related to pulmonary edema, however, given the asymmetry of findings (right greater than left and apical greater than basal), superimposed atypical infection is also not excluded. A followup nonemergent high-resolution chest CT is recommended in the next several months after resolution of the patient's acute illness to better evaluate the interstitial lung findings. 2. Progressively increasing adenopathy in the thorax, and worsening splenomegaly. This could suggest underlying lymphoproliferative disorder. Notably, the mediastinal adenopathy is also remarkable for relatively avid enhancement of the lymph nodes. This is of uncertain etiology and significance, but can be seen in the setting of Castleman's disease. Although that is not a strongly favored diagnosis, clinical correlation is suggested. 3. Additional incidental findings, as above.   Electronically Signed   By: Vinnie Langton M.D.   On: 03/17/2014 14:12       Medications:    Infusions:    Scheduled Medications: . antiseptic oral rinse  7 mL Mouth Rinse BID  . azithromycin  500 mg Oral QHS  . benzonatate  100 mg Oral TID  . cefTRIAXone (ROCEPHIN)  IV  2 g Intravenous Q24H  . enoxaparin  100 mg Subcutaneous BID  . gabapentin  100 mg Oral TID  . lamoTRIgine  200 mg Oral q morning - 10a  . LORazepam  1 mg Oral q morning - 10a  . LORazepam  2 mg Oral QHS  . nicotine  14 mg Transdermal QHS  . pantoprazole  40 mg Oral Daily  . pravastatin  80 mg Oral Daily  . QUEtiapine  400 mg Oral QHS  . sertraline  200 mg Oral q morning - 10a  . sodium chloride  3 mL Intravenous Q12H  . 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 She was saturating better last night prior to this morning, likely due to her obstructive sleep apnea and lack of mobility during the night. She is willing to get rid of her bird, which we recommend given the possibility of bird fancier's lung. Pro-calcitonin less than 0.1, suggesting that this is not a bacterial pneumonia.  -Stop ceftriaxone and azithromycin, completed 5 days. -Continue prednisone 40 mg daily for 1-2 weeks then taper over 2-4 weeks. -Follow-up hypersensitivity panel. -Patient will work on getting rid of her bird today. -Continue oxygen supplementation to keep saturations greater than 92%. -Continue DuoNeb's and albuterol nebulizers when necessary. -Continue Robitussin-DM every 6 hours as needed. -Continue Tessalon Perles 3 times daily. -Tylenol 500 mg every 6 hours as needed. -Incentive spirometry. -Out of bed to chair 2 times daily. -Follow-up high-resolution CT after resolution of acute flare. -Needs Prevnar vaccine as an outpatient.  #Bilateral hilar and mediastinal lymphadenopathy with splenomegaly. Mediastinal lymphadenopathy can be associated with bird fancier's lung, but  could also be related to sarcoidosis, lymphoproliferative disease, or Castleman's disease. Polychromasia on peripheral smear likely related to iron deficiency anemia. -Follow-up ACE level. -Consider lymph node biopsy in the future.  #History of DVT and PE -Continue Lovenox 100 mg twice a day.  #Acute on chronic iron deficiency anemia No evidence of infection currently. -Continue to monitor hemoglobin. -Restart ferrous sulfate 325 mg 3 times a day.  #Urge and stress incontinence No longer on ceftriaxone. -Restart Macrobid daily to continue until her urology appointment.  #GERD -Continue home Protonix 40 mg daily.  #Chronic low back pain -Continue home Norco every 6 hours as needed.  #Depression and anxiety -Continue home Ativan 1 mg in the morning and 2 mg at night. -Continue home Lamictal, Seroquel, and Zoloft.  #Hyperlipidemia -Continue pravastatin 80 mg daily.  #Tobacco abuse -Nicotine patch.   DVT PPX - low molecular weight heparin  CODE STATUS - Full.  CONSULTS PLACED - None.  DISPO - Disposition is deferred at this time, awaiting improvement of hypoxia.   Anticipated discharge in approximately 2-3 day(s).   The patient does have a current PCP (KLIMA, Damita Lack, MD) and does need an [facility] hospital follow-up appointment after discharge.    Is the [facility] hospital follow-up appointment a one-time only appointment? no.  Does the patient have transportation limitations that hinder transportation to clinic appointments? yes   SERVICE NEEDED AT [place]         Y = Yes, Blank = No PT:   OT:   RN:   Equipment:   Other:      Length of Stay: 6 day(s)   Signed: Arman Filter, MD  PGY-1, Internal Medicine Resident Pager: [phone] (7AM-5PM) 03/19/2014, 8:33 AM

## 2014-03-19 NOTE — Progress Notes (Signed)
Internal Medicine Attending  Date: 03/19/2014  Patient name: [name] Medical record number: [id number] Date of birth: 04/20/1945 Age: 69 y.o. Gender: female  I saw and evaluated the patient. I reviewed the resident's note by Dr. Trudee Kuster and I agree with the resident's findings and plans as documented in his progress note.  Symptomatically she is unchanged. This is not surprising as the steroids were just begun yesterday. She is discussing with her aide the need to get rid of the bird which will hopefully happen in the next day or 2. I asked her to ask the aide to clean, specifically vacuuming and have the windows and doors open while the weather is extremely warm over the next couple of days, to further air out her living space after the bird has been removed. I anticipate she will be ready for discharge home on prednisone with eventual taper and hopefully temporary home oxygen therapy in the next 24-48 hours.

## 2014-03-20 ENCOUNTER — Inpatient Hospital Stay (HOSPITAL_COMMUNITY): Payer: Medicare Other

## 2014-03-20 DIAGNOSIS — J9610 Chronic respiratory failure, unspecified whether with hypoxia or hypercapnia: Secondary | ICD-10-CM

## 2014-03-20 DIAGNOSIS — J9600 Acute respiratory failure, unspecified whether with hypoxia or hypercapnia: Secondary | ICD-10-CM

## 2014-03-20 LAB — CBC
HCT: 32.4 % — ABNORMAL LOW (ref 36.0–46.0)
HEMOGLOBIN: 9.4 g/dL — AB (ref 12.0–15.0)
MCH: 23.3 pg — AB (ref 26.0–34.0)
MCHC: 29 g/dL — ABNORMAL LOW (ref 30.0–36.0)
MCV: 80.2 fL (ref 78.0–100.0)
Platelets: 324 10*3/uL (ref 150–400)
RBC: 4.04 MIL/uL (ref 3.87–5.11)
RDW: 18.9 % — AB (ref 11.5–15.5)
WBC: 21 10*3/uL — ABNORMAL HIGH (ref 4.0–10.5)

## 2014-03-20 LAB — BLOOD GAS, ARTERIAL
Acid-Base Excess: 4.3 mmol/L — ABNORMAL HIGH (ref 0.0–2.0)
Bicarbonate: 29.2 mEq/L — ABNORMAL HIGH (ref 20.0–24.0)
Drawn by: 41977
FIO2: 100 %
O2 SAT: 96.7 %
PO2 ART: 94 mmHg (ref 80.0–100.0)
Patient temperature: 98.6
TCO2: 30.8 mmol/L (ref 0–100)
pCO2 arterial: 52 mmHg — ABNORMAL HIGH (ref 35.0–45.0)
pH, Arterial: 7.368 (ref 7.350–7.450)

## 2014-03-20 LAB — TSH: TSH: 2.867 u[IU]/mL (ref 0.350–4.500)

## 2014-03-20 LAB — CULTURE, BLOOD (ROUTINE X 2)
CULTURE: NO GROWTH
Culture: NO GROWTH

## 2014-03-20 LAB — SEDIMENTATION RATE: Sed Rate: 68 mm/hr — ABNORMAL HIGH (ref 0–22)

## 2014-03-20 LAB — VITAMIN D 25 HYDROXY (VIT D DEFICIENCY, FRACTURES): VIT D 25 HYDROXY: 15 ng/mL — AB (ref 30–100)

## 2014-03-20 LAB — PROCALCITONIN: Procalcitonin: 0.39 ng/mL

## 2014-03-20 LAB — ANGIOTENSIN CONVERTING ENZYME: Angiotensin-Converting Enzyme: 50 U/L (ref 14–82)

## 2014-03-20 LAB — BRAIN NATRIURETIC PEPTIDE: B NATRIURETIC PEPTIDE 5: 123.3 pg/mL — AB (ref 0.0–100.0)

## 2014-03-20 MED ORDER — BUDESONIDE 0.25 MG/2ML IN SUSP
0.2500 mg | Freq: Four times a day (QID) | RESPIRATORY_TRACT | Status: DC
  Administered 2014-03-20 – 2014-03-21 (×2): 0.25 mg via RESPIRATORY_TRACT
  Filled 2014-03-20 (×7): qty 2

## 2014-03-20 MED ORDER — IPRATROPIUM-ALBUTEROL 0.5-2.5 (3) MG/3ML IN SOLN
3.0000 mL | Freq: Four times a day (QID) | RESPIRATORY_TRACT | Status: DC
  Administered 2014-03-20 – 2014-04-06 (×66): 3 mL via RESPIRATORY_TRACT
  Filled 2014-03-20 (×63): qty 3

## 2014-03-20 MED ORDER — METHYLPREDNISOLONE SODIUM SUCC 125 MG IJ SOLR
80.0000 mg | Freq: Two times a day (BID) | INTRAMUSCULAR | Status: DC
  Administered 2014-03-21 – 2014-03-27 (×14): 80 mg via INTRAVENOUS
  Filled 2014-03-20 (×14): qty 1.28

## 2014-03-20 MED ORDER — DOXYCYCLINE HYCLATE 100 MG IV SOLR
100.0000 mg | Freq: Two times a day (BID) | INTRAVENOUS | Status: DC
  Administered 2014-03-20 – 2014-03-28 (×16): 100 mg via INTRAVENOUS
  Filled 2014-03-20 (×18): qty 100

## 2014-03-20 MED ORDER — METHYLPREDNISOLONE SODIUM SUCC 40 MG IJ SOLR
40.0000 mg | Freq: Three times a day (TID) | INTRAMUSCULAR | Status: DC
  Administered 2014-03-20: 40 mg via INTRAVENOUS
  Filled 2014-03-20 (×3): qty 1

## 2014-03-20 MED ORDER — FUROSEMIDE 10 MG/ML IJ SOLN
40.0000 mg | Freq: Once | INTRAMUSCULAR | Status: AC
  Administered 2014-03-20: 40 mg via INTRAVENOUS
  Filled 2014-03-20: qty 4

## 2014-03-20 NOTE — Progress Notes (Signed)
Patient is unable to maintain sats without NRB and will not be able to tolerate CPAP tonight. Will continue to monitor.

## 2014-03-20 NOTE — Progress Notes (Signed)
NT called this RN into patient room for low O2 sats. Pt was pale, cold, with low temp 99.5 and with chills. Vitals rechecked several times with no change. O2 as low as 35. Respiratory called and placed patient on nonrebreather 15L with O2 back into the 90's. Rapid response called as well as MD who is now at the bedside. Continuing to monitor. Lanell Dubie, Rande Brunt, RN

## 2014-03-20 NOTE — Progress Notes (Signed)
Was called by RN that patient was found to be pale, o2 sat 30's, having chills. Was put on non-re-breather 15L with o2 sat in high 90's. Denies chest pain (only when coughing). States feeling just cold. She had some watery-brown emesis.  Exam:  General: a&ox3.  Heent: [place]/at Lungs: rhonchi diffusely, mild tachypnea. Failed weaning to [place]. Remains on non-re-breather. Heart: RRR Legs: trace edema   Filed Vitals:   03/20/14 0507  BP: 136/46  Pulse: 102  Temp: 99.5 F (37.5 C)  Resp: 24    Acute on chronic hypoxia  was on 4L oxygen, desatted to 30's all the sudden. Patient was found pale by RN. Recovered to high 90's on 15 L non-re-breather. mentating well now, doing better.  Likely from a thick mucus plug blocking her airway causing her hypoxia. She sounds congested.  Could have a PE (al though on therapeutic dose of lovenox for previous DVT) - with tachycardia and new hypoxia.  - obtained ABG 7.36/52/94/29 bicarb, hgb 9.5 on ABG. - check CXR, check CBC. - will discuss with day team and Dr. Eppie Gibson about further plans.

## 2014-03-20 NOTE — Progress Notes (Signed)
Physical Therapy Treatment Patient Details Name: [name] MRN: [id number] DOB: 1945-10-19 Today's Date: 03/20/2014    History of Present Illness 69 yo female with onset of fever, cough, SOB was admitted with acute respiratory failure and CAP.      PT Comments    Progressing slowly, requiring encouragement to get her to participate.  Need to progress her quickly to walking before she loses the ability.  Follow Up Recommendations  SNF;Supervision/Assistance - 24 hour;Other (comment) (pt talks only about going home, has PCA 6hrs/day, 7 days/wk)     Equipment Recommendations  Rolling walker with 5" wheels    Recommendations for Other Services       Precautions / Restrictions Precautions Precautions: Fall    Mobility  Bed Mobility Overal bed mobility: Needs Assistance Bed Mobility: Supine to Sit     Supine to sit: Min assist     General bed mobility comments: cues for best technique  Transfers Overall transfer level: Needs assistance   Transfers: Sit to/from Stand;Stand Pivot Transfers Sit to Stand: Mod assist Stand pivot transfers: Mod assist       General transfer comment: face to face assist.  Pt needed encouragement to push herself to tr7y  Ambulation/Gait             General Gait Details: pt stated she couldn't walk and would try today.   Stairs            Wheelchair Mobility    Modified Rankin (Stroke Patients Only)       Balance Overall balance assessment: Needs assistance Sitting-balance support: No upper extremity supported Sitting balance-Leahy Scale: Fair       Standing balance-Leahy Scale: Fair                      Cognition Arousal/Alertness: Awake/alert Behavior During Therapy: WFL for tasks assessed/performed Overall Cognitive Status: Within Functional Limits for tasks assessed                      Exercises General Exercises - Lower Extremity Ankle Circles/Pumps: AROM;10 reps;Supine Hip  Flexion/Marching: AAROM;Both;10 reps;Supine Other Exercises Other Exercises: general hip/knee AAROM exercise prior to mobility.    General Comments        Pertinent Vitals/Pain Pain Assessment: Faces Faces Pain Scale: Hurts little more Pain Location: bil knees Pain Descriptors / Indicators: Aching;Constant;Grimacing Pain Intervention(s): Limited activity within patient's tolerance;Monitored during session    Home Living                      Prior Function            PT Goals (current goals can now be found in the care plan section) Acute Rehab PT Goals Patient Stated Goal: to go home not to rehab PT Goal Formulation: With patient Time For Goal Achievement: 03/30/14 Potential to Achieve Goals: Good Progress towards PT goals: Progressing toward goals    Frequency  Min 3X/week    PT Plan Current plan remains appropriate    Co-evaluation             End of Session Equipment Utilized During Treatment: Oxygen Activity Tolerance: Patient tolerated treatment well;Other (comment) (though very limited) Patient left: in chair;with call bell/phone within reach;with nursing/sitter in room     Time: [id number] PT Time Calculation (min) (ACUTE ONLY): 29 min  Charges:  $Therapeutic Exercise: 8-22 mins $Therapeutic Activity: 8-22 mins  G Codes:      Logyn Kendrick, Tessie Fass 03/20/2014, 12:43 PM    03/20/2014  Donnella Sham, PT [phone]  (pager)

## 2014-03-20 NOTE — Progress Notes (Signed)
Subjective:    She continues to have difficulty breathing, but she reports feeling better than this morning. She says that her mouth is feeling dry. She had an episode of productive cough this morning, but she is not coughing currently.  Interval Events: -Desaturated to 35% on 4 L nasal cannula, placed on nonrebreather. -Chest x-ray shows progressive diffuse lung opacification. -Attempted to wean to 55% Venturi, but sats dropped to mid 80s this morning.    Objective:    Vital Signs:   Temp:  [98.5 F (36.9 C)-99.5 F (37.5 C)] 99.5 F (37.5 C) (02/29 0605) Pulse Rate:  [78-102] 87 (02/29 0605) Resp:  [18-24] 22 (02/29 0605) BP: (108-144)/(42-56) 108/53 mmHg (02/29 0605) SpO2:  [91 %-98 %] 98 % (02/29 0605) Last BM Date: 03/19/14  24-hour weight change: Weight change:   Intake/Output:   Intake/Output Summary (Last 24 hours) at 03/20/14 1125 Last data filed at 03/20/14 1003  Gross per 24 hour  Intake    340 ml  Output    500 ml  Net   -160 ml      Physical Exam: General: Morbidly obese, on non-rebreather.  Lungs:  Diffuse rhonchi and wheezing bilaterally.   Heart: RRR. 2/6 systolic ejection murmur.  Abdomen:  BS normoactive. Soft, Nondistended, non-tender.  No masses or organomegaly.  Extremities: Trace lower extremity edema.     Labs:  Basic Metabolic Panel:  Recent Labs Lab 03/13/14 1554 03/15/14 0557 03/16/14 0524 03/17/14 0542 03/18/14 0613  NA 140  --  138 140 138  K 3.7  --  3.4* 4.3 4.0  CL 103  --  97 100 102  CO2 29  --  31 34* 29  GLUCOSE 123*  --  126* 132* 119*  BUN 13  --  7 7 9   CREATININE 0.63  --  0.69 0.73 0.69  CALCIUM 8.5  --  7.9* 8.2* 8.0*  MG  --  2.2  --   --   --   PHOS  --  2.9  --   --   --     Liver Function Tests:  Recent Labs Lab 03/13/14 1554  AST 15  ALT 11  ALKPHOS 87  BILITOT 0.4  PROT 6.5  ALBUMIN 3.4*   CBC:  Recent Labs Lab 03/13/14 1554 03/14/14 0552  03/16/14 0524 03/17/14 0542  03/18/14 0613 03/19/14 0842 03/20/14 0553  WBC 13.1* 14.6*  < > 11.8* 11.7* 11.5* 13.3* 21.0*  NEUTROABS 10.5* 11.8*  --   --   --  8.5*  --   --   HGB 9.8* 9.1*  < > 9.0* 8.8* 8.8* 9.1* 9.4*  HCT 33.6* 31.6*  < > 31.1* 30.7* 30.2* 31.9* 32.4*  MCV 80.4 80.0  < > 79.9 79.3 78.9 79.0 80.2  PLT 252 250  < > 266 274 274 302 324  < > = values in this interval not displayed.  Cardiac Enzymes:  Recent Labs Lab 03/13/14 1554  TROPONINI <0.03   Microbiology: Results for orders placed or performed during the hospital encounter of 03/13/14  Culture, blood (routine x 2) Call MD if unable to obtain prior to antibiotics being given     Status: None   Collection Time: 03/13/14 10:35 PM  Result Value Ref Range Status   Specimen Description BLOOD LEFT ARM  Final   Special Requests BOTTLES DRAWN AEROBIC AND ANAEROBIC 5CC  Final   Culture   Final    NO GROWTH 5 DAYS Performed at [facility]    Report Status 03/20/2014 FINAL  Final  Culture, blood (routine x 2) Call MD if unable to obtain prior to antibiotics being given     Status: None   Collection Time: 03/13/14 10:40 PM  Result Value Ref Range Status   Specimen Description BLOOD RIGHT ARM  Final   Special Requests BOTTLES DRAWN AEROBIC AND ANAEROBIC 5CC  Final   Culture   Final    NO GROWTH 5 DAYS Performed at [facility]    Report Status 03/20/2014 FINAL  Final  MRSA PCR Screening     Status: None   Collection Time: 03/14/14  3:25 AM  Result Value Ref Range Status   MRSA by PCR NEGATIVE NEGATIVE Final    Comment:        The GeneXpert MRSA Assay (FDA approved for NASAL specimens only), is one component of a comprehensive MRSA colonization surveillance program. It is not intended to diagnose MRSA infection nor to guide or monitor treatment for MRSA infections.   Culture, sputum-assessment     Status: None   Collection Time: 03/14/14  4:04 PM  Result Value Ref Range Status   Specimen Description SPUTUM   Final   Special Requests Normal  Final   Sputum evaluation   Final    MICROSCOPIC FINDINGS SUGGEST THAT THIS SPECIMEN IS NOT REPRESENTATIVE OF LOWER RESPIRATORY SECRETIONS. PLEASE RECOLLECT. NOTIFIED Dallie Piles 03/14/14 1722 M [facility]IPMAN    Report Status 03/14/2014 FINAL  Final  Culture, expectorated sputum-assessment     Status: None   Collection Time: 03/15/14 12:29 PM  Result Value Ref Range Status   Specimen Description SPUTUM  Final   Special Requests Normal  Final   Sputum evaluation   Final    THIS SPECIMEN IS ACCEPTABLE. RESPIRATORY CULTURE REPORT TO FOLLOW.   Report Status 03/15/2014 FINAL  Final  Culture, respiratory (NON-Expectorated)     Status: None   Collection Time: 03/15/14 12:29 PM  Result Value Ref Range Status   Specimen Description SPUTUM  Final   Special Requests NONE  Final   Gram Stain   Final    RARE WBC PRESENT, PREDOMINANTLY PMN RARE SQUAMOUS EPITHELIAL CELLS PRESENT NO ORGANISMS SEEN Performed at [facility]    Culture   Final    NORMAL OROPHARYNGEAL FLORA Performed at [facility]    Report Status 03/17/2014 FINAL  Final   Imaging: Dg Chest Port 1 View  03/20/2014   CLINICAL DATA:  Hypoxia  EXAM: PORTABLE CHEST - 1 VIEW  COMPARISON:  03/13/2014  FINDINGS: Progressive diffuse opacification of the lungs. These changes obscure underlying interstitial lung disease, noted on 02/01/2013 chest CT, with fibrotic change.  There is unchanged cardiomegaly. Upper mediastinal widening which is likely technical, but there is adenopathy based on recent chest CT.  IMPRESSION: Progressive diffuse lung opacification, likely pneumonia or edema superimposed on interstitial lung disease.   Electronically Signed   By: Monte Fantasia M.D.   On: 03/20/2014 06:23       Medications:    Infusions:    Scheduled Medications: . antiseptic oral rinse  7 mL Mouth Rinse BID  . benzonatate  100 mg Oral TID  . enoxaparin  100 mg Subcutaneous BID  . ferrous  sulfate  325 mg Oral TID WC  . gabapentin  100 mg Oral TID  . lamoTRIgine  200 mg Oral q morning - 10a  . LORazepam  1 mg Oral q morning - 10a  . LORazepam  2 mg Oral QHS  . methylPREDNISolone (SOLU-MEDROL) injection  40 mg Intravenous Q8H  . nicotine  14 mg Transdermal QHS  . nitrofurantoin (macrocrystal-monohydrate)  100 mg Oral QHS  . pantoprazole  40 mg Oral Daily  . pravastatin  80 mg Oral Daily  . QUEtiapine  400 mg Oral QHS  . sertraline  200 mg Oral q morning - 10a  . sodium chloride  3 mL Intravenous Q12H  . 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 She had appeared to be improving on prednisone, but chest x-ray much worse today. She was restarted on Macrobid yesterday which can worsen interstitial lung disease, and may be contributing to her worsening symptoms today. Pulmonology was consulted and recommended IV steroids. 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 She has removed her bird from her house. Unlikely to be heart failure, but infiltrates could be related to fluid. TSH normal. -Transfer to stepdown unit. -Consulted  pulmonology this morning for additional insight. -Continue oxygen supplementation to keep saturations greater than 92%. -Stop Macrobid. -Start Solu-Medrol 40 mg every 8 hours per pulmonology recommendations. -Continue DuoNeb nebs and albuterol. -Lasix 40 mg IV once. -Follow-up hypersensitivity panel. -Recheck pro-calcitonin per pulmonology recommendations. -Repeat sputum culture. -Continue Robitussin-DM every 6 hours as needed. -Continue Tessalon Perles 3 times daily. -Tylenol 500 mg every 6 hours as needed. -Incentive spirometry and pulmonary hygiene. -Out of bed to chair 2 times daily. -She will need follow-up high-resolution CT after resolution of acute flare. -Needs Prevnar vaccine as an outpatient.  #Bilateral hilar and mediastinal lymphadenopathy with splenomegaly. -Follow-up ACE level. -Consider lymph node biopsy in the future.  #History of DVT and PE -Continue Lovenox 100 mg twice a day.  #Constipation and hair loss TSH normal, constipation possibly due to opioids or iron supplementation. -Senokot-S BID PRN. -MiraLAX daily.  #Acute on chronic iron deficiency anemia -Continue to monitor hemoglobin. -Continue ferrous sulfate 325 mg 3 times a day.  #Urge and stress incontinence -Stop Macrobid given association with pulmonary hypersensitivity.  #GERD -Continue home Protonix 40 mg daily.  #Chronic low back pain -Continue home Norco every 6 hours as needed.  #Depression and anxiety -Continue home Ativan 1 mg in the morning and 2 mg at night. -Continue home Lamictal, Seroquel, and Zoloft.  #Hyperlipidemia -Continue pravastatin 80 mg daily.  #Tobacco abuse -Nicotine patch.   DVT PPX - low molecular weight heparin  CODE STATUS - Full.  CONSULTS PLACED - None.  DISPO - Disposition is deferred at this time, awaiting improvement of hypoxia.   Anticipated discharge in approximately 3-4 day(s).   The patient does have a current PCP (KLIMA, Damita Lack, MD) and  does need an [facility] hospital follow-up appointment after discharge.  Is the [facility] hospital follow-up appointment a one-time only appointment? no.  Does the patient have transportation limitations that hinder transportation to clinic appointments? yes   SERVICE NEEDED AT [place]         Y = Yes, Blank = No PT: Home health  OT: Home health  RN:   Equipment: Rolling walker  Other:      Length of Stay: 7 day(s)   Signed: Arman Filter, MD  PGY-1, Internal Medicine Resident Pager: [phone] (7AM-5PM) 03/20/2014, 11:25 AM

## 2014-03-20 NOTE — Progress Notes (Signed)
OT Cancellation Note  Patient Details Name: [name] MRN: [id number] DOB: 01-24-1945   Cancelled Treatment:    Reason Eval/Treat Not Completed: Medical issues which prohibited therapy, pt. Currently awaiting transfer to ICU secondary to o2 management issues.  Will check back as pt. Medically stable and able to tolerate skilled therapies.  Janice Coffin, COTA/L 03/20/2014, 8:06 AM

## 2014-03-20 NOTE — Progress Notes (Signed)
Internal Medicine Attending  Date: 03/20/2014  Patient name: [name] Medical record number: [id number] Date of birth: 1945/12/22 Age: 69 y.o. Gender: female  I saw and evaluated the patient. I reviewed the resident's note by Dr. Trudee Kuster and I agree with the resident's findings and plans as documented in his progress note.  [name] was found to be pale and hypoxemic this morning on nursing rounds. Upon evaluation a few minutes later I found her work of breathing to be unchanged from yesterday but she was considerably hypoxemic requiring a partial nonrebreather mask. She still complained of difficulty expectorating her mucus but was without other new complaints. Examination revealed diffuse rhonchi and expiratory wheezes that was essentially unchanged from yesterday's exam. We tried to lower her oxygen to a 55% Venturi mask but she quickly desaturated into the mid 80s. Upon replacement of the partial nonrebreather her saturations immediately increased back to the mid 90s. 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 We consulted pulmonary medicine today for additional insight and thoughts and we will follow their recommendations of converting the oral steroids to IV Solu-Medrol. We will be transferring her to a stepdown unit for closer observation. She has been able to get rid of the bird. We are hopeful with the more intense steroid therapy we may see a turn for the better over the next 24 hours with regards to her hypoxemia.

## 2014-03-20 NOTE — Progress Notes (Signed)
Called by RT to see pt for decreased sats.  On arrival pt on a NRB sats 95%.  MD at bedside, ABG obtained, 7.36/52/94/29.2.  PCXR obtained.  Pt breathing better, will cont. To monitor.

## 2014-03-20 NOTE — Consult Note (Signed)
Name: [name] MRN: [id number] DOB: 10-Mar-1945    ADMISSION DATE:  03/13/2014 CONSULTATION DATE:  03/20/2014  REFERRING MD :  Eppie Gibson ([facility])  CHIEF COMPLAINT:  SOB  BRIEF PATIENT DESCRIPTION: 69 y.o. F brought to ED 2/22 for SOB.  CXR revealed b/l infiltrates.  She was admitted and treated for CAP.  CT chest a few days later revealed progressive ILD.  During early AM hours 2/29, pt desaturated to 35 despite 4L O2 via [place].  She was placed on NRB and PCCM consulted for further recs.  SIGNIFICANT EVENTS  2/22 - admit 2/29 - desaturated to 35, placed on NRB, PCCM consulted  STUDIES:  CXR 2/22 >>> diffuse lung opacification superimposed on ILD. 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 GGO's in crazy paving pattern.  Numerous enlarged mediastinal and hilar lymph nodes.   HISTORY OF PRESENT ILLNESS:  [name] is a 69 y.o. F with PMH as outlined below who presented to ED 2/22 for SOB, worsening productive cough, and subjective fevers.  Symptoms had been going on roughly 2 weeks at the time of presentation and did not subside at all therefore she decided to seek medical evaluation.  Sputum had started white but turned to brown.  Denied chest pain, N/V/D, abd pain, myalgias.  No chills or sweats and do documented fevers, only subjective. CXR showed b/l opacity's on top of chronic peripheral pulm fibrosis.  She was admitted with working dx of CAP and treated as such with abx. During early AM hours 2/29, CNA noted that pt's sats were as low as 35 despite 4L O2 via [place] (had good sats prior to this) and pt was cold and experiencing chills.  She was placed on NRB and sats improved to 90's.  ABG was found to be normal except for mild hypercarbia.  PCCM was consulted for further recs. On 2/26, CT chest was obtained and revealed progressive ILD with findings favored to represent chronic  hypersensitivity pneumonitis.  In addition, numerous enlarged mediastinal and hilar lymph nodes were seen.  Pt was previously on 2L O2 as outpatient but this was in the past (unsure how long ago).  She has OSA but does not use a CPAP due to it being uncomfortable.  Of note, pt has had a dove as a pet in her home for the past 7 - 8 years.  She has not noticed any breathing problems when she is around the bird.  She does report worsening dyspnea in her home primarily with exertion.  Since admission, she has made plans to re-home the dove at the advice of her PCP (who is also her attending in hospital).   She has been a heavy smoker, roughly 40 PY history.  She tells me that she is planning to quit once she gets back home.  In addition, she has been on macrobid since ? 2/17 after urology evaluation for mixed urinary incontinence.  She was to to take it for 10 days then transition to 1 tab for 30 days.  At the time of my encounter, she denies any fevers/chills/sweats, chest pain, N/V/D, abd pain, myalgias.  She states her cough had decreased over the past few days but earlier this morning she had a mild episode of productive cough with brown tinged sputum.  She has not coughed up anything since then.  PFT's from 02/09/13:  FEV1 80% pre / 64% post, ratio 74% pre /  65% post.  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  has past surgical history that includes Cervical discectomy (11/1995 and 05/2002); Abdominal hysterectomy (1976); Bladder suspension (1993); ORIF ankle fracture (1993); Lumbar laminectomy/decompression microdiscectomy (02/2007); Carpal tunnel release (Left); Tonsillectomy and adenoidectomy (1953); Appendectomy; Dilation and curettage of uterus; Tubal ligation; Carpectomy (07/23/2011); Esophagogastroduodenoscopy (12/25/2011); Breast surgery (1992); Rhinoplasty (1993); Spine surgery; Hot hemostasis (02/06/2012); Esophagogastroduodenoscopy (02/06/2012); Esophagogastroduodenoscopy (N/A, 04/02/2012); Back surgery; Tendon transfer (Left, 06/24/2012); Mastectomy (Bilateral); Melanoma excision (Left); and Tendon transfer (Left, 06/24/2012). Prior to Admission medications   Medication Sig Start Date End Date Taking? Authorizing Provider  albuterol (PROVENTIL) (2.5 MG/3ML) 0.083% nebulizer solution Take 3 mLs (2.5 mg total) by nebulization every 6 (six) hours as needed for shortness of breath. 11/09/13  Yes Karren Cobble, MD  alendronate (FOSAMAX) 70 MG tablet Take 1 tablet (70 mg total) by mouth every 7 (seven) days. Take with a full glass of water on an empty stomach. 10/24/13  Yes Karren Cobble, MD  calcium citrate-vitamin D (CITRACAL+D) 315-200 MG-UNIT per tablet Take 2 tablets by mouth 2 (two) times daily. 11/29/12  Yes Karren Cobble, MD  enoxaparin (LOVENOX) 100 MG/ML injection Inject 1 mL (100 mg total) into the skin 2 (two) times daily. 10/21/13  Yes Karren Cobble, MD  ferrous sulfate 325 (65 FE) MG EC tablet Take 325 mg by mouth 3 (three) times daily with meals. 03/23/12  Yes Karren Cobble, MD  gabapentin (NEURONTIN) 100 MG capsule Take 100 mg by mouth 3 (three)  times daily.    Yes Historical Provider, MD  lamoTRIgine (LAMICTAL) 200 MG tablet Take 200 mg by mouth every morning.   Yes Historical Provider, MD  LORazepam (ATIVAN) 1 MG tablet Take 1-2 mg by mouth 2 (two) times daily. Takes 1 tablet in morning and 2 tablets at bedtime   Yes Historical Provider, MD  nitrofurantoin, macrocrystal-monohydrate, (MACROBID) 100 MG capsule Take 100 mg by mouth 2 (two) times daily.   Yes Historical Provider, MD  pantoprazole (PROTONIX) 40 MG tablet Take 1 tablet (40 mg total) by mouth daily. 02/16/13  Yes Karren Cobble, MD  pravastatin (PRAVACHOL) 80 MG tablet Take 1 tablet (80 mg total) by mouth daily.   Yes Karren Cobble, MD  QUEtiapine (SEROQUEL) 200 MG tablet Take 400 mg by mouth at bedtime.    Yes Historical Provider, MD  sertraline (ZOLOFT) 100 MG tablet Take 200 mg by mouth every morning.    Yes Historical Provider, MD  vitamin C (ASCORBIC ACID) 500 MG tablet Take 1 tablet (500 mg total) by mouth daily. 06/24/12  Yes Linna Hoff, MD  HYDROcodone-acetaminophen [facility]) 10-325 MG per tablet Take 1-2 tablets by mouth every 6 (six) hours as needed for moderate pain. Patient not taking: Reported on 03/13/2014 12/29/13   Karren Cobble, MD  Potassium Chloride ER 20 MEQ TBCR Take 80  mEq by mouth daily. 01/25/13 06/22/13  Francesca Oman, DO   Allergies  Allergen Reactions  . Adhesive [Tape] Other (See Comments)    "I break out all over"; Can only use paper tape  . Codeine Other (See Comments)    "first dose I took, my aide found me the next day laying on the floor"  . Morphine Other (See Comments)    caused change in mental status, she does not want to try it again.  . Sulfonamide Derivatives Nausea And Vomiting  . Thorazine [Chlorpromazine] Nausea Only    FAMILY HISTORY:  family history includes Alcohol abuse in her father; Anxiety disorder in her son; Cancer - Lung in her brother; Deep vein thrombosis in her mother; Healthy in her brother and brother; Heart  disease in her mother; Schizophrenia in her father. SOCIAL HISTORY:  reports that she has been smoking Cigarettes.  She has a 20 pack-year smoking history. She has never used smokeless tobacco. She reports that she does not drink alcohol or use illicit drugs.  REVIEW OF SYSTEMS:   All negative; except for those that are bolded, which indicate positives.  Constitutional: weight loss, weight gain, night sweats, fevers, chills, fatigue, weakness.  HEENT: headaches, sore throat, sneezing, nasal congestion, post nasal drip, difficulty swallowing, tooth/dental problems, visual complaints, visual changes, ear aches. Neuro: difficulty with speech, weakness, numbness, ataxia. CV:  chest pain, orthopnea, PND, swelling in lower extremities, dizziness, palpitations, syncope.  Resp: cough, hemoptysis, dyspnea, wheezing. GI  heartburn, indigestion, abdominal pain, nausea, vomiting, diarrhea, constipation, change in bowel habits, loss of appetite, hematemesis, melena, hematochezia.  GU: dysuria, change in color of urine, urgency or frequency, flank pain, hematuria. MSK: joint pain or swelling, decreased range of motion. Psych: change in mood or affect, depression, anxiety, suicidal ideations, homicidal ideations. Skin: rash, itching, bruising.    SUBJECTIVE:   VITAL SIGNS: Temp:  [98.5 F (36.9 C)-99.5 F (37.5 C)] 99.5 F (37.5 C) (02/29 0605) Pulse Rate:  [78-102] 87 (02/29 0605) Resp:  [18-24] 22 (02/29 0605) BP: (108-144)/(42-56) 108/53 mmHg (02/29 0605) SpO2:  [91 %-98 %] 98 % (02/29 0605)  PHYSICAL EXAMINATION: General: Morbidly obese female, resting in bed, in NAD. Neuro: A&O x 3, non-focal.  HEENT: [place]/AT. PERRL, sclerae anicteric. Cardiovascular: RRR, soft SEM.  Lungs: Respirations even and unlabored on NRB.  Coarse rhonchi bilaterally with expiratory wheeze. Abdomen: Obese, BS x 4, soft, NT/ND.  Musculoskeletal: No gross deformities, 1+ non-pitting edema.  Skin: Intact, warm, no  rashes.    Recent Labs Lab 03/16/14 0524 03/17/14 0542 03/18/14 0613  NA 138 140 138  K 3.4* 4.3 4.0  CL 97 100 102  CO2 31 34* 29  BUN _0 CREATININE 0.69 0.73 0.69  GLUCOSE 126* 132* 119*    Recent Labs Lab 03/18/14 0613 03/19/14 0842 03/20/14 0553  HGB 8.8* 9.1* 9.4*  HCT 30.2* 31.9* 32.4*  WBC 11.5* 13.3* 21.0*  PLT 274 302 324   Dg Chest Port 1 View  03/20/2014   CLINICAL DATA:  Hypoxia  EXAM: PORTABLE CHEST - 1 VIEW  COMPARISON:  03/13/2014  FINDINGS: Progressive diffuse opacification of the lungs. These changes obscure underlying interstitial lung disease, noted on 02/01/2013 chest CT, with fibrotic change.  There is unchanged cardiomegaly. Upper mediastinal widening which is likely technical, but there is adenopathy based on recent chest CT.  IMPRESSION: Progressive diffuse lung opacification, likely pneumonia or edema superimposed on interstitial lung disease.   Electronically Signed   By:  Monte Fantasia M.D.   On: 03/20/2014 06:23    ASSESSMENT / PLAN:  Acute on chronic hypoxic respiratory failure Progressive ILD, ? Hypersensitivity pneumonitis - CT worse compared to 2015.  Concern for bird fanciers lung certainly valid given her hx of 7 - 8 yr in home exposure to dove droppings, feathers, etc. Recs: Continue supplemental O2 to maintain SpO2 > 92%. Start solumedrol 75m q8hrs then transition to prednisone over next few days. Continue DuoNebs, Albuterol. F/u hypersensitivity pneumonitis panel, HRCT. Will defer on additional serologies given that pt has received 1 - 2 doses of prednisone. Stop macrobid as it can contribute to / worsen ILD. Incentive Spirometry. Pulmonary hygiene.  Mediastinal and hilar lymphadenopathy - chronic dating back to 2011. Recs: F/u ACE . Consider biopsy in future.  ? New HCAP - Doubtful but will evaluate further given worsening leukocytosis (although she is on steroids), brown tinged sputum, worsening hypoxia and  tachypnea. Recs: Repeat PCT to assess for change (note was < 0.10 on 2/27). Repeat sputum culture.  OSA / probable OHS Recs: Continue nocturnal CPAP.  Pulmonary Hypertension (echo from 2013, PAP 40-44) - Class 3 likely due to chronic lung disease and OSA. Recs: Avoid hypoxia. Consider outpatient follow up for further evaluation.  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  Tobacco use disorder Recs: Smoking cessation strongly encouraged.  She tells me that she is planning on quitting once she is back home.  Rest per primary.   RMontey Hora POld [facility] Medicine Pager: [phone] or ([phone]/29/2016, 10:29 AM    PCCM ATTENDING: I have reviewed pt's initial presentation, consultants notes and hospital database in detail.  The above assessment and plan was formulated under my direction.  It is notable that the patient has had abnormal CXRs and CTs of chest dating back at least to 2004. Her series of Xrays over the years demonstrates periodic bouts of increased pulmonary infiltrates in a pattern consistent with pneumonitis. She has had a weakly positive ANA and a modestly elevated ACE level (which is not very useful) in the past but at the same time, her ESR was normal and her anti-dsDNA was negative. Results of other serologies from 2009 cannot be recovered in the system (at least, I can't recover them).   In short, I think this is an exacerbation of some incompletely defined underlying/chronic ILD.   I have resent several serologies inc ANA, ANCA, RF, ESR etc. I have also ordered repeat BNP and an EKG. If either of these latter two studies are significantly abnormal, I suggest further eval with an echocardiogram (though I acknowledge that the current CT chest is much more suggestive of pneumonitis than of pulmonary edema).  Although, I am doubtful that this is a bacterial infection, we will  check PCT today and in AM and I have started doxycycline (noting bird exposure) empirically.  The definitive diagnosis of inflammatory interstitial lung diseases usually requires a surgical lung biopsy for which she is not a candidate due to her obesity and co-morbidities (ie. Risk/benefit profile is not favorable).   From a practical point of view, interstitial lung diseases are either steroid responsive. Therefore, we have increased the dose of steroids to methylpred 80 mg IV q 12 hrs and we will follow her course clinically and radiographically.   Because of finding of wheezes on exam, I have changed her BDs to scheduled  and added nebulized steroids    Merton Border, MD;  PCCM service; Mobile [phone]

## 2014-03-20 NOTE — Progress Notes (Signed)
Patient alert and oriented x 4, out of bed in her chair. Per MD patient will be transferred to 2 C 12. Nurse was called and report was given. Patient will be transferred via bed.

## 2014-03-21 DIAGNOSIS — J9621 Acute and chronic respiratory failure with hypoxia: Secondary | ICD-10-CM

## 2014-03-21 DIAGNOSIS — J189 Pneumonia, unspecified organism: Secondary | ICD-10-CM | POA: Insufficient documentation

## 2014-03-21 LAB — CBC WITH DIFFERENTIAL/PLATELET
BASOS ABS: 0 10*3/uL (ref 0.0–0.1)
Basophils Relative: 0 % (ref 0–1)
EOS PCT: 1 % (ref 0–5)
Eosinophils Absolute: 0.1 10*3/uL (ref 0.0–0.7)
HEMATOCRIT: 32.1 % — AB (ref 36.0–46.0)
Hemoglobin: 8.9 g/dL — ABNORMAL LOW (ref 12.0–15.0)
Lymphocytes Relative: 4 % — ABNORMAL LOW (ref 12–46)
Lymphs Abs: 0.9 10*3/uL (ref 0.7–4.0)
MCH: 22.2 pg — ABNORMAL LOW (ref 26.0–34.0)
MCHC: 27.7 g/dL — ABNORMAL LOW (ref 30.0–36.0)
MCV: 80 fL (ref 78.0–100.0)
Monocytes Absolute: 0.4 10*3/uL (ref 0.1–1.0)
Monocytes Relative: 2 % — ABNORMAL LOW (ref 3–12)
Neutro Abs: 21.7 10*3/uL — ABNORMAL HIGH (ref 1.7–7.7)
Neutrophils Relative %: 93 % — ABNORMAL HIGH (ref 43–77)
Platelets: 331 10*3/uL (ref 150–400)
RBC: 4.01 MIL/uL (ref 3.87–5.11)
RDW: 18.6 % — ABNORMAL HIGH (ref 11.5–15.5)
WBC: 23.2 10*3/uL — ABNORMAL HIGH (ref 4.0–10.5)

## 2014-03-21 LAB — BASIC METABOLIC PANEL
Anion gap: 8 (ref 5–15)
BUN: 17 mg/dL (ref 6–23)
CO2: 32 mmol/L (ref 19–32)
CREATININE: 0.74 mg/dL (ref 0.50–1.10)
Calcium: 8.7 mg/dL (ref 8.4–10.5)
Chloride: 102 mmol/L (ref 96–112)
GFR calc Af Amer: >90 mL/min (ref >90)
GFR calc non Af Amer: 85 mL/min — ABNORMAL LOW (ref >90)
Glucose, Bld: 153 mg/dL — ABNORMAL HIGH (ref 70–99)
Potassium: 4.4 mmol/L (ref 3.5–5.1)
Sodium: 142 mmol/L (ref 135–145)

## 2014-03-21 LAB — PROCALCITONIN: PROCALCITONIN: 1.46 ng/mL

## 2014-03-21 MED ORDER — BUDESONIDE 0.25 MG/2ML IN SUSP
0.2500 mg | Freq: Four times a day (QID) | RESPIRATORY_TRACT | Status: DC
  Administered 2014-03-21 – 2014-04-06 (×61): 0.25 mg via RESPIRATORY_TRACT
  Filled 2014-03-21 (×73): qty 2

## 2014-03-21 NOTE — Progress Notes (Signed)
No new complaints No distress Able to wean FiO2 some today  Filed Vitals:   03/21/14 0736 03/21/14 0849 03/21/14 1147 03/21/14 1438  BP: 134/64  116/56   Pulse: 73  79   Temp: 98 F (36.7 C)  97 F (36.1 C)   TempSrc: Axillary  Axillary   Resp: 20  13   Height:      Weight:      SpO2: 99% 99% 95% 94%   NAD HEENT WNL Cannot visualize JVP Bilateral crackles, no wheezes RRR Obese, soft, NT No LE edema  I have reviewed all of today's lab results. Relevant abnormalities are discussed in the A/P section  Serologies pending  No new CXR  IMPRESSION: Acute on chronic hypoxic respiratory failure - perhaps slightly improved Underlying chronic interstitial fibrosis Diffuse pulmonary infiltrates - pneumonitis pattern  PLAN/REC: Cont doxy 1/7-10 days Cont methylpred @ current dose Recheck CXR AM 3/02 F/U serologies   Merton Border, MD ; [facility] service Mobile [phone].  After 5:30 PM or weekends, call [phone]

## 2014-03-21 NOTE — Progress Notes (Signed)
Patient is unable to maintain sats within normal ranges without the NRB mask. She is unable to tolerate CPAP at this time. Will continue to monitor.

## 2014-03-21 NOTE — Progress Notes (Signed)
Subjective: [name] feels "okay" today. She is eager to get better so that she can leave the hospital, but feels like her breathing is still poor. She is not in pain. She complains of a dry mouth. She has not been coughing.  Interval Events:  Patient was unable to use CPAP last night due to desaturations off of her nonrebreather.   Objective: Vital signs in last 24 hours: Filed Vitals:   03/21/14 0600 03/21/14 0736 03/21/14 0849 03/21/14 1147  BP: 105/48 134/64  116/56  Pulse: 73 73  79  Temp:  98 F (36.7 C)  97 F (36.1 C)  TempSrc:  Axillary  Axillary  Resp: 20 20  13   Height:      Weight:      SpO2: 99% 99% 99% 95%   Weight change:   Intake/Output Summary (Last 24 hours) at 03/21/14 1312 Last data filed at 03/21/14 1041  Gross per 24 hour  Intake    500 ml  Output    650 ml  Net   -150 ml   Physical Exam: Appearance: Morbidly obese, breathing comfortably on non-rebreather, moving mask to the side while eating sausage biscuit for breakfast HEENT: Mouth appears dry, otherwise AT/[place], PERRL, no lymphadenopathy Heart: RRR, 3/6 systolic ejection murmur Lungs: Diffuse rhonchi with audible expiratory wheezing (had not yet had duoneb treatment today) Abdomen: BS+, obese, soft, nontender Extremities: Trace BLE edema Neurologic: A&Ox3, grossly intact Skin: No rashes or lesions  Lab Results: Basic Metabolic Panel:  Recent Labs Lab 03/15/14 0557  03/18/14 0613 03/21/14 0320  NA  --   < > 138 142  K  --   < > 4.0 4.4  CL  --   < > 102 102  CO2  --   < > 29 32  GLUCOSE  --   < > 119* 153*  BUN  --   < > 9 17  CREATININE  --   < > 0.69 0.74  CALCIUM  --   < > 8.0* 8.7  MG 2.2  --   --   --   PHOS 2.9  --   --   --   < > = values in this interval not displayed. CBC:  Recent Labs Lab 03/18/14 0613  03/20/14 0553 03/21/14 0320  WBC 11.5*  < > 21.0* 23.2*  NEUTROABS 8.5*  --   --  21.7*  HGB 8.8*  < > 9.4* 8.9*  HCT 30.2*  < > 32.4* 32.1*  MCV 78.9  < >  80.2 80.0  PLT 274  < > 324 331  < > = values in this interval not displayed.  Thyroid Function Tests:  Recent Labs Lab 03/20/14 0553  TSH 2.867   Urine Drug Screen: Drugs of Abuse     Component Value Date/Time   LABOPIA POSITIVE* 03/25/2008 2325   COCAINSCRNUR NONE DETECTED 03/25/2008 2325   LABBENZ POSITIVE* 03/25/2008 2325   AMPHETMU NONE DETECTED 03/25/2008 2325   THCU NONE DETECTED 03/25/2008 2325   LABBARB  03/25/2008 2325    NONE DETECTED        DRUG SCREEN FOR MEDICAL PURPOSES ONLY.  IF CONFIRMATION IS NEEDED FOR ANY PURPOSE, NOTIFY LAB WITHIN 5 DAYS.        LOWEST DETECTABLE LIMITS FOR URINE DRUG SCREEN Drug Class       Cutoff (ng/mL) Amphetamine      1000 Barbiturate      200 Benzodiazepine   449 Tricyclics  300 Opiates          300 Cocaine          300 THC              50     Micro Results: Recent Results (from the past 240 hour(s))  Culture, blood (routine x 2) Call MD if unable to obtain prior to antibiotics being given     Status: None   Collection Time: 03/13/14 10:35 PM  Result Value Ref Range Status   Specimen Description BLOOD LEFT ARM  Final   Special Requests BOTTLES DRAWN AEROBIC AND ANAEROBIC 5CC  Final   Culture   Final    NO GROWTH 5 DAYS Performed at [facility]    Report Status 03/20/2014 FINAL  Final  Culture, blood (routine x 2) Call MD if unable to obtain prior to antibiotics being given     Status: None   Collection Time: 03/13/14 10:40 PM  Result Value Ref Range Status   Specimen Description BLOOD RIGHT ARM  Final   Special Requests BOTTLES DRAWN AEROBIC AND ANAEROBIC 5CC  Final   Culture   Final    NO GROWTH 5 DAYS Performed at [facility]    Report Status 03/20/2014 FINAL  Final  MRSA PCR Screening     Status: None   Collection Time: 03/14/14  3:25 AM  Result Value Ref Range Status   MRSA by PCR NEGATIVE NEGATIVE Final    Comment:        The GeneXpert MRSA Assay (FDA approved for NASAL  specimens only), is one component of a comprehensive MRSA colonization surveillance program. It is not intended to diagnose MRSA infection nor to guide or monitor treatment for MRSA infections.   Culture, sputum-assessment     Status: None   Collection Time: 03/14/14  4:04 PM  Result Value Ref Range Status   Specimen Description SPUTUM  Final   Special Requests Normal  Final   Sputum evaluation   Final    MICROSCOPIC FINDINGS SUGGEST THAT THIS SPECIMEN IS NOT REPRESENTATIVE OF LOWER RESPIRATORY SECRETIONS. PLEASE RECOLLECT. NOTIFIED Dallie Piles 03/14/14 1722 M [facility]    Report Status 03/14/2014 FINAL  Final  Culture, expectorated sputum-assessment     Status: None   Collection Time: 03/15/14 12:29 PM  Result Value Ref Range Status   Specimen Description SPUTUM  Final   Special Requests Normal  Final   Sputum evaluation   Final    THIS SPECIMEN IS ACCEPTABLE. RESPIRATORY CULTURE REPORT TO FOLLOW.   Report Status 03/15/2014 FINAL  Final  Culture, respiratory (NON-Expectorated)     Status: None   Collection Time: 03/15/14 12:29 PM  Result Value Ref Range Status   Specimen Description SPUTUM  Final   Special Requests NONE  Final   Gram Stain   Final    RARE WBC PRESENT, PREDOMINANTLY PMN RARE SQUAMOUS EPITHELIAL CELLS PRESENT NO ORGANISMS SEEN Performed at [facility]    Culture   Final    NORMAL OROPHARYNGEAL FLORA Performed at [facility]    Report Status 03/17/2014 FINAL  Final   Studies/Results: Dg Chest Port 1 View  03/20/2014   CLINICAL DATA:  Hypoxia  EXAM: PORTABLE CHEST - 1 VIEW  COMPARISON:  03/13/2014  FINDINGS: Progressive diffuse opacification of the lungs. These changes obscure underlying interstitial lung disease, noted on 02/01/2013 chest CT, with fibrotic change.  There is unchanged cardiomegaly. Upper mediastinal widening which is likely technical, but there is  adenopathy based on recent chest CT.  IMPRESSION: Progressive diffuse lung  opacification, likely pneumonia or edema superimposed on interstitial lung disease.   Electronically Signed   By: Monte Fantasia M.D.   On: 03/20/2014 06:23   Medications: I have reviewed the patient's current medications. Scheduled Meds: . antiseptic oral rinse  7 mL Mouth Rinse BID  . benzonatate  100 mg Oral TID  . budesonide  0.25 mg Nebulization 4 times per day  . doxycycline (VIBRAMYCIN) IV  100 mg Intravenous Q12H  . enoxaparin  100 mg Subcutaneous BID  . ferrous sulfate  325 mg Oral TID WC  . gabapentin  100 mg Oral TID  . ipratropium-albuterol  3 mL Nebulization Q6H  . lamoTRIgine  200 mg Oral q morning - 10a  . LORazepam  1 mg Oral q morning - 10a  . LORazepam  2 mg Oral QHS  . methylPREDNISolone (SOLU-MEDROL) injection  80 mg Intravenous Q12H  . nicotine  14 mg Transdermal QHS  . pantoprazole  40 mg Oral Daily  . pravastatin  80 mg Oral Daily  . QUEtiapine  400 mg Oral QHS  . sertraline  200 mg Oral q morning - 10a  . sodium chloride  3 mL Intravenous Q12H  . 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   Acute on  Chronic Hypoxic Respiratory Failure: Patient initially was short of breath with superimposed infiltrate on CXR, but did not respond to ceftriaxone/azithromycin antibiotic therapy. She received a CT that revealed worsened chronic respiratory pneumonitis. Of note, she previously had a dove at home, concerning for bird fanciers lung. She has had the bird removed from her house. Though her repeat procalcitonin rose to 1.46 (from 0.39 on 2/29) and her WBC rose from 11 to 23 during her hospitalization, PCCM favors that this is most likely an acute flare of her chronic pneumonitis. Patient is currently afebrile (97). Hypersensitivity panel, ANCA, RF, Mpo/pr-3 (anca) antibody pending. ESR elevated.  - Appreciate PCCM consult; PCCM does not feel escalation of antibiotics in necessary, in that her doxycycline would cover atypical pneumonia and silicosis. Also do not recommend transbronchial BAL at this point given that the patient is so high risk - Continue nonrebreather - Requested humidified O2 - Continue incentive spirometer and pulmonary hygiene  - OOB BID - Continue doxycycline - Continue solu-medrol 40 mg q8 hours - Continue nebs (duonebs q6 hours, pulmicort q6 hours and albuterol q2 hours PRN) - Continue Robitussin-DM q6 hours PRN - Continue Tessalon Perles TID - Continue tylenol 500 mg PRN q 6 hours - Repeat sputum culture (needs to be collected) - Needs Prevnar vaccine as outpatient - Patient will need high-resolution CT after acute resolution  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 ACE 95 6 years ago. - Repeat ACE pending - Consider transbronchial LN biopsy in the future  Constipation and Hair Loss: TSH normal. Constipation may be secondary to opioids or iron supplementation. - Senokot-S BID PRN - Miralax daily  Acute on Chronic Iron Deficiency Anemia: Hemoglobin 8.9 (down from low 9s). - Continue to trend - Continue oral iron  supplementation TID  Urge and Stress Incontinence: Macrobid has been stopped, as this has an association with pulmonary hypersensitivity. Patient had been on it for her chronic colonization because she was to receive a urodynamic study as an outpatient.  GERD:  - Continue home protonix 40 mg by mouth daily  Depression and Anxiety:  - Continue home Ativan 1 mg by mouth in the am and 2 mg by mouth at night - Continue home Lamictal, Seroquel and Zoloft  Chronic Low Back Pain: Patient has not been using her PRN Norco. - Continue home Norco q6 hours PRN   Tobacco Abuse: Current smoker.  - Cessation counseling - Nicotine patch  History of DVT and PE:  - Continue lovenox 100 mg BID  Diet: Regular  Dispo: Disposition is deferred at this time, awaiting improvement of current medical problems.  Anticipated discharge in approximately 3-4 day(s).   The patient does have a current PCP Karren Cobble, MD) and does need an [facility] hospital follow-up appointment after discharge.  The patient does not have transportation limitations that hinder transportation to clinic appointments.  .Services Needed at time of discharge: Y = Yes, Blank = No PT:   OT:   RN:   Equipment:   Other:     LOS: 8 days   Drucilla Schmidt, MD 03/21/2014, 1:12 PM

## 2014-03-21 NOTE — Progress Notes (Signed)
OT Cancellation Note  Patient Details Name: CORTNEY BEISSEL MRN: [id number] DOB: [birth year]   Cancelled Treatment:    Reason Eval/Treat Not Completed: Medical issues which prohibited therapy. Pt continuing to require NRB to maintain 02 sats. Will continue to follow.  Malka So 03/21/2014, 10:28 AM  [phone]

## 2014-03-22 ENCOUNTER — Inpatient Hospital Stay (HOSPITAL_COMMUNITY): Payer: Medicare Other

## 2014-03-22 DIAGNOSIS — E559 Vitamin D deficiency, unspecified: Secondary | ICD-10-CM | POA: Diagnosis present

## 2014-03-22 LAB — CBC
HCT: 32.7 % — ABNORMAL LOW (ref 36.0–46.0)
Hemoglobin: 9.2 g/dL — ABNORMAL LOW (ref 12.0–15.0)
MCH: 23.1 pg — AB (ref 26.0–34.0)
MCHC: 28.1 g/dL — ABNORMAL LOW (ref 30.0–36.0)
MCV: 82 fL (ref 78.0–100.0)
PLATELETS: 361 10*3/uL (ref 150–400)
RBC: 3.99 MIL/uL (ref 3.87–5.11)
RDW: 18.8 % — AB (ref 11.5–15.5)
WBC: 18.5 10*3/uL — AB (ref 4.0–10.5)

## 2014-03-22 LAB — MPO/PR-3 (ANCA) ANTIBODIES: Myeloperoxidase Abs: <9 U/mL (ref 0.0–9.0)

## 2014-03-22 LAB — RHEUMATOID FACTOR: Rhuematoid fact SerPl-aCnc: 17.3 IU/mL — ABNORMAL HIGH (ref 0.0–13.9)

## 2014-03-22 MED ORDER — VITAMIN D (ERGOCALCIFEROL) 1.25 MG (50000 UNIT) PO CAPS: 50000.0000 [IU] | ORAL_CAPSULE | ORAL | Status: DC

## 2014-03-22 MED ORDER — VITAMIN D (ERGOCALCIFEROL) 1.25 MG (50000 UNIT) PO CAPS
50000.0000 [IU] | ORAL_CAPSULE | ORAL | Status: DC
  Administered 2014-03-23 – 2014-03-30 (×2): 50000 [IU] via ORAL
  Filled 2014-03-22 (×2): qty 1

## 2014-03-22 NOTE — Progress Notes (Signed)
Patient refuses CPAP at this time. RT will continue to monitor

## 2014-03-22 NOTE — Progress Notes (Signed)
Subjective: [name] feels "better" today. She is eager to leave the hospital. She "died in my dream last night" and is upset by this. She is otherwise comfortable.  Interval Events:  - Switched from non-rebreather to partial rebreather - Patient was again unable to use CPAP last night due to desaturations off of her partial rebreather  Objective: Vital signs in last 24 hours: Filed Vitals:   03/22/14 0105 03/22/14 0402 03/22/14 0837 03/22/14 0902  BP:  126/55 120/55   Pulse:  75 73   Temp:  98 F (36.7 C) 97 F (36.1 C)   TempSrc:  Axillary Axillary   Resp:  18 22   Height:      Weight:      SpO2: 95% 93% 98% 93%   Weight change:   Intake/Output Summary (Last 24 hours) at 03/22/14 1305 Last data filed at 03/22/14 1138  Gross per 24 hour  Intake    500 ml  Output    300 ml  Net    200 ml   Physical Exam: Appearance: Morbidly obese, breathing comfortably on partial rebreather, appears comfortable HEENT: Mouth appears dry, otherwise AT/[place], PERRL, no lymphadenopathy Heart: RRR, 3/6 systolic ejection murmur Lungs: Diffuse rhonchi with expiratory wheezing (decreased from yesterday's exam) Abdomen: BS+, obese, soft, nontender Extremities: Trace BLE edema Neurologic: A&Ox3, grossly intact Skin: No rashes or lesions  Lab Results: Basic Metabolic Panel:  Recent Labs Lab 03/18/14 0613 03/21/14 0320  NA 138 142  K 4.0 4.4  CL 102 102  CO2 29 32  GLUCOSE 119* 153*  BUN 9 17  CREATININE 0.69 0.74  CALCIUM 8.0* 8.7   CBC:  Recent Labs Lab 03/18/14 0613  03/21/14 0320 03/22/14 0423  WBC 11.5*  < > 23.2* 18.5*  NEUTROABS 8.5*  --  21.7*  --   HGB 8.8*  < > 8.9* 9.2*  HCT 30.2*  < > 32.1* 32.7*  MCV 78.9  < > 80.0 82.0  PLT 274  < > 331 361  < > = values in this interval not displayed.  Thyroid Function Tests:  Recent Labs Lab 03/20/14 0553  TSH 2.867   Urine Drug Screen: Drugs of Abuse     Component Value Date/Time   LABOPIA POSITIVE*  03/25/2008 2325   COCAINSCRNUR NONE DETECTED 03/25/2008 2325   LABBENZ POSITIVE* 03/25/2008 2325   AMPHETMU NONE DETECTED 03/25/2008 2325   THCU NONE DETECTED 03/25/2008 2325   LABBARB  03/25/2008 2325    NONE DETECTED        DRUG SCREEN FOR MEDICAL PURPOSES ONLY.  IF CONFIRMATION IS NEEDED FOR ANY PURPOSE, NOTIFY LAB WITHIN 5 DAYS.        LOWEST DETECTABLE LIMITS FOR URINE DRUG SCREEN Drug Class       Cutoff (ng/mL) Amphetamine      1000 Barbiturate      200 Benzodiazepine   454 Tricyclics       098 Opiates          300 Cocaine          300 THC              50     Micro Results: Recent Results (from the past 240 hour(s))  Culture, blood (routine x 2) Call MD if unable to obtain prior to antibiotics being given     Status: None   Collection Time: 03/13/14 10:35 PM  Result Value Ref Range Status   Specimen Description BLOOD LEFT ARM  Final  Special Requests BOTTLES DRAWN AEROBIC AND ANAEROBIC 5CC  Final   Culture   Final    NO GROWTH 5 DAYS Performed at [facility]    Report Status 03/20/2014 FINAL  Final  Culture, blood (routine x 2) Call MD if unable to obtain prior to antibiotics being given     Status: None   Collection Time: 03/13/14 10:40 PM  Result Value Ref Range Status   Specimen Description BLOOD RIGHT ARM  Final   Special Requests BOTTLES DRAWN AEROBIC AND ANAEROBIC 5CC  Final   Culture   Final    NO GROWTH 5 DAYS Performed at [facility]    Report Status 03/20/2014 FINAL  Final  MRSA PCR Screening     Status: None   Collection Time: 03/14/14  3:25 AM  Result Value Ref Range Status   MRSA by PCR NEGATIVE NEGATIVE Final    Comment:        The GeneXpert MRSA Assay (FDA approved for NASAL specimens only), is one component of a comprehensive MRSA colonization surveillance program. It is not intended to diagnose MRSA infection nor to guide or monitor treatment for MRSA infections.   Culture, sputum-assessment     Status: None    Collection Time: 03/14/14  4:04 PM  Result Value Ref Range Status   Specimen Description SPUTUM  Final   Special Requests Normal  Final   Sputum evaluation   Final    MICROSCOPIC FINDINGS SUGGEST THAT THIS SPECIMEN IS NOT REPRESENTATIVE OF LOWER RESPIRATORY SECRETIONS. PLEASE RECOLLECT. NOTIFIED Dallie Piles 03/14/14 1722 M [facility]    Report Status 03/14/2014 FINAL  Final  Culture, expectorated sputum-assessment     Status: None   Collection Time: 03/15/14 12:29 PM  Result Value Ref Range Status   Specimen Description SPUTUM  Final   Special Requests Normal  Final   Sputum evaluation   Final    THIS SPECIMEN IS ACCEPTABLE. RESPIRATORY CULTURE REPORT TO FOLLOW.   Report Status 03/15/2014 FINAL  Final  Culture, respiratory (NON-Expectorated)     Status: None   Collection Time: 03/15/14 12:29 PM  Result Value Ref Range Status   Specimen Description SPUTUM  Final   Special Requests NONE  Final   Gram Stain   Final    RARE WBC PRESENT, PREDOMINANTLY PMN RARE SQUAMOUS EPITHELIAL CELLS PRESENT NO ORGANISMS SEEN Performed at [facility]    Culture   Final    NORMAL OROPHARYNGEAL FLORA Performed at [facility]    Report Status 03/17/2014 FINAL  Final   Studies/Results: Dg Chest Port 1 View  03/22/2014   CLINICAL DATA:  Hypoxia.  EXAM: PORTABLE CHEST - 1 VIEW  COMPARISON:  03/20/2014.  FINDINGS: Mediastinum and hilar structures normal. Stable cardiomegaly. Persistent dense consolidation of both lungs. No pleural effusion or pneumothorax. Prior cervical spine fusion.  IMPRESSION: 1. Persistent dense consolidation of both lungs. No interim clearing. 2. Persistent cardiomegaly.   Electronically Signed   By: Marcello Moores  Register   On: 03/22/2014 07:11   Medications: I have reviewed the patient's current medications. Scheduled Meds: . antiseptic oral rinse  7 mL Mouth Rinse BID  . benzonatate  100 mg Oral TID  . budesonide  0.25 mg Nebulization 4 times per day  . doxycycline  (VIBRAMYCIN) IV  100 mg Intravenous Q12H  . enoxaparin  100 mg Subcutaneous BID  . ferrous sulfate  325 mg Oral TID WC  . gabapentin  100 mg Oral TID  . ipratropium-albuterol  3 mL Nebulization Q6H  . lamoTRIgine  200 mg Oral q morning - 10a  . LORazepam  1 mg Oral q morning - 10a  . LORazepam  2 mg Oral QHS  . methylPREDNISolone (SOLU-MEDROL) injection  80 mg Intravenous Q12H  . nicotine  14 mg Transdermal QHS  . pantoprazole  40 mg Oral Daily  . pravastatin  80 mg Oral Daily  . QUEtiapine  400 mg Oral QHS  . sertraline  200 mg Oral q morning - 10a  . sodium chloride  3 mL Intravenous Q12H  . vitamin C  500 mg Oral Daily  . [START ON 03/23/2014] Vitamin D (Ergocalciferol)  50,000 Units Oral Q7 days   Continuous Infusions:  PRN Meds:.acetaminophen, albuterol, guaiFENesin-dextromethorphan, HYDROcodone-acetaminophen, polyethylene glycol, senna-docusate Assessment/Plan: 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   Morbid obesity with BMI of 50.0-59.9, adult   Gastric antral vascular ectasia   Hyperlipidemia LDL goal <130   Anxiety   Gastroesophageal reflux disease   Osteoporosis   CAP (community acquired pneumonia)   Anemia, iron deficiency   Pneumonitis, hypersensitivity, avian   Pneumonitis   Vitamin D deficiency  [name] is a 69 yo woman who is a current smoker with restrictive lung disease who has been found to have 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   Acute on Chronic Hypoxic Respiratory Failure: Patient initially was short of breath with superimposed infiltrate on CXR, but did not respond to ceftriaxone/azithromycin antibiotic therapy. She then received a CT that revealed worsened chronic respiratory pneumonitis. 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 She has had the bird removed from her house. Though her repeat procalcitonin rose to 1.46 (from 0.39 on 2/29) and she had a white count (11-->23-->18), PCCM confident that this is likely an acute flare of her chronic pneumonitis. Patient is currently afebrile (98.1). Hypersensitivity panel, ANCA, RF, Mpo/pr-3 (anca) antibody pending. ESR elevated at 68.  - Appreciate PCCM consult; PCCM does not feel escalation of antibiotics in necessary, in that her doxycycline would cover atypical pneumonia - May need transbronchial BAL once she is lower risk; this could help to confirm bird fancier's disease - Attempt trial on venturi mask this afternoon - Continue incentive spirometer and pulmonary hygiene  - OOB BID - Continue PT and OT - Continue doxycycline - Continue solu-medrol 80 mg q12 hours - Continue nebs (duonebs q6 hours, pulmicort q6 hours and albuterol q2 hours PRN) - Continue Robitussin-DM q6 hours PRN - Continue Tessalon Perles TID - Continue tylenol 500 mg PRN q 6 hours - Repeat sputum culture (needs to be collected) - Hypersensitivity panel pending - Needs Prevnar vaccine as outpatient - Patient will need high-resolution CT after acute resolution  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 ACE 95 (H) 6 years ago but 50 (WNL) on this admission. - Consider transbronchial LN biopsy in the future  Constipation and Hair Loss: TSH normal. Constipation may be secondary to opioids or iron supplementation. - Senokot-S BID PRN - Miralax daily  Chronic Iron Deficiency Anemia: Hemoglobin 8.9-->9.2 (back to baseline). MCV 82.0. - Continue to trend - Continue oral iron supplementation TID  Urge and Stress Incontinence: Macrobid has been stopped,  as this has an association with pulmonary hypersensitivity. Patient had been on it for her chronic colonization because she was  to receive a urodynamic study as an outpatient.  GERD:  - Continue home protonix 40 mg by mouth daily  Depression and Anxiety: Anxiety about health has been present on this admission (dreams about death etc). - Continue home Ativan 1 mg by mouth in the am and 2 mg by mouth at night - Continue home Lamictal, Seroquel and Zoloft  Chronic Low Back Pain: Patient has not been using her PRN Norco. - Continue home Norco q6 hours PRN   Tobacco Abuse: Current smoker.  - Cessation counseling - Nicotine patch  Vitamin D Deficiency: 15 - Started ergo 50 U weekly  History of DVT and PE:  - Continue lovenox 100 mg BID  Diet: Regular  Dispo: Disposition is deferred at this time, awaiting improvement of current medical problems.  Anticipated discharge in approximately 2-3 day(s).   The patient does have a current PCP Karren Cobble, MD) and does need an [facility] hospital follow-up appointment after discharge.  The patient does not have transportation limitations that hinder transportation to clinic appointments.  .Services Needed at time of discharge: Y = Yes, Blank = No PT:   OT:   RN:   Equipment:   Other:     LOS: 9 days   Drucilla Schmidt, MD 03/22/2014, 1:05 PM

## 2014-03-22 NOTE — Progress Notes (Signed)
Pt place on venturi mask. Sats between 80-90.  MD verbalized satisfaction with sats above 85%. Pt stable and will continue to monitor. Dorena Cookey, RN

## 2014-03-22 NOTE — Progress Notes (Signed)
Pt placed back on nonrebreather from venturi mask. O2 sats down to 75%. Pt taking mask on and off while trying to eat so she was put back on the nonrebreather for some extra support. Pt currently stable with sats at 98%. Will continue to monitor. Dorena Cookey, RN

## 2014-03-22 NOTE — Progress Notes (Signed)
Physical Therapy Treatment Patient Details Name: [name] MRN: [id number] DOB: Sep 23, 1945 Today's Date: 03/22/2014    History of Present Illness 69 yo female with onset of fever, cough, SOB was admitted with acute respiratory failure and CAP. Transfered to step down due increased WOB 03/20/14 and placed on NRB.    PT Comments    Patient making some progress towards PT goals. Patient tolerated EOB >6 minutes with monitoring of VS, SpO2 remained >96% on NRB, agreeable to transfer to chair. Assist patient with elevation and pivot to chair. SpO2 improved to 98% in chair despite increased fatigue. Patient educated regarding BLE ROM and OOB mobility. Will continue to see and progress as tolerated.   Follow Up Recommendations  SNF;Supervision/Assistance - 24 hour;Other (comment) (pt talks only about going home, has PCA 6hrs/day, 7 days/wk)     Equipment Recommendations  Rolling walker with 5" wheels    Recommendations for Other Services       Precautions / Restrictions Precautions Precautions: Fall Restrictions Weight Bearing Restrictions: No    Mobility  Bed Mobility Overal bed mobility: Needs Assistance Bed Mobility: Supine to Sit     Supine to sit: Mod assist     General bed mobility comments: VCs for hand placement, assist to elevate trunk to upright, cues to reposition with hip rotation at EOB  Transfers Overall transfer level: Needs assistance Equipment used:  (face to face with gait belt and chuck pad)   Sit to Stand: Mod assist;+2 physical assistance Stand pivot transfers: Mod assist;+2 physical assistance       General transfer comment: face to face, patient able to elevate to standing with moderate assist, continued assist to pivot to chair with small shuffling steps. BLE instability noted (BLE buckles at baseline)  Ambulation/Gait                 Stairs            Wheelchair Mobility    Modified Rankin (Stroke Patients Only)        Balance     Sitting balance-Leahy Scale: Fair                              Cognition Arousal/Alertness: Awake/alert Behavior During Therapy: Anxious Overall Cognitive Status: Within Functional Limits for tasks assessed                      Exercises General Exercises - Upper Extremity Shoulder Flexion: AROM;Both;10 reps;Supine Elbow Flexion: AROM;Both;10 reps;Supine Elbow Extension: AROM;Both;10 reps;Supine General Exercises - Lower Extremity Hip ABduction/ADduction: AROM;Both;10 reps;Supine Hip Flexion/Marching: AAROM;Both;10 reps;Supine    General Comments General comments (skin integrity, edema, etc.): patient cued for pursed lip breathing and deep inhalation techniques. Patient tolerated EOB >6 minutes with monitoring of VS, SpO2 remained >96% on NRB, agreeable to transfer to chair. Assist patient with elevation and pivot to chair.       Pertinent Vitals/Pain Pain Assessment: Faces Faces Pain Scale: Hurts little more Pain Location: R wrist (pt pulled out IV earlier)    Home Living                      Prior Function            PT Goals (current goals can now be found in the care plan section) Acute Rehab PT Goals Patient Stated Goal: to go home not to rehab PT Goal Formulation: With patient Time  For Goal Achievement: 03/30/14 Potential to Achieve Goals: Good Progress towards PT goals: Progressing toward goals (modest)    Frequency  Min 3X/week    PT Plan Current plan remains appropriate    Co-evaluation             End of Session Equipment Utilized During Treatment: Oxygen Activity Tolerance: Patient tolerated treatment well;Other (comment) (though very limited) Patient left: in chair;with call bell/phone within reach;with nursing/sitter in room     Time: 1155-1214 PT Time Calculation (min) (ACUTE ONLY): 19 min  Charges:  $Therapeutic Activity: 8-22 mins                    G CodesDuncan Dull March 27, 2014,  1:54 PM Alben Deeds, [place] DPT  [phone]

## 2014-03-22 NOTE — Progress Notes (Signed)
Occupational Therapy Treatment Patient Details Name: [name] MRN: [id number] DOB: [birth year] Today's Date: 03/22/2014    History of present illness 69 yo female with onset of fever, cough, SOB was admitted with acute respiratory failure and CAP. Transfered to step down due increased WOB 03/20/14 and placed on NRB.   OT comments  Pt anxious about breathing difficulties with exertion, but agreeable to bed level exercises to see how she tolerated and then progressing activity.  Pt able to maintain 02 sats at 95% on NRB.  Pt verbalizing anxiety about weaning to venti mask later today.  Practiced breathing and relaxation techniques to reduce anxiety. Will continue to follow.  Follow Up Recommendations  Home health OT (would benefit from SNF, but is refusing)    Equipment Recommendations  None recommended by OT    Recommendations for Other Services      Precautions / Restrictions Precautions Precautions: Fall       Mobility Bed Mobility                  Transfers                      Balance                                   ADL                                         General ADL Comments: Instructed and practiced breathing techniques and relaxation techniques. Instructed in energy conservation/pacing.      Vision                     Perception     Praxis      Cognition   Behavior During Therapy: Anxious Overall Cognitive Status: Within Functional Limits for tasks assessed                       Extremity/Trunk Assessment               Exercises General Exercises - Upper Extremity Shoulder Flexion: AROM;Both;10 reps;Supine Elbow Flexion: AROM;Both;10 reps;Supine Elbow Extension: AROM;Both;10 reps;Supine General Exercises - Lower Extremity Hip ABduction/ADduction: AROM;Both;10 reps;Supine Hip Flexion/Marching: AAROM;Both;10 reps;Supine   Shoulder Instructions       General  Comments      Pertinent Vitals/ Pain       Pain Assessment: Faces Faces Pain Scale: Hurts little more Pain Location: R wrist (pt pulled out IV earlier)  Home Living                                          Prior Functioning/Environment              Frequency Min 2X/week     Progress Toward Goals  OT Goals(current goals can now be found in the care plan section)  Progress towards OT goals: Progressing toward goals  Acute Rehab OT Goals Patient Stated Goal: to go home not to rehab  Plan Discharge plan remains appropriate    Co-evaluation                 End of Session  Activity Tolerance Patient tolerated treatment well   Patient Left in bed;with call bell/phone within reach (PT with pt)   Nurse Communication  (activity tolerance)        Time: [id number] OT Time Calculation (min): 19 min  Charges: OT General Charges $OT Visit: 1 Procedure OT Treatments $Therapeutic Exercise: 8-22 mins  Malka So 03/22/2014, 12:05 PM  [phone]

## 2014-03-22 NOTE — Progress Notes (Addendum)
[facility] PCCM  S: Ms Greaves says she thinks that she is feeling better since coming to the hospital, but she remains tachypnic and hypoxemic.  ODanley Danker Vitals:   03/22/14 0837 03/22/14 0902 03/22/14 1308 03/22/14 1423  BP: 120/55  120/57   Pulse: 73  75   Temp: 97 F (36.1 C)  98.1 F (36.7 C)   TempSrc: Axillary  Axillary   Resp: 22  16   Height:      Weight:      SpO2: 98% 93% 100% 97%   FiO2: 55% face mask (on my exam her O2 saturation remained between 83-88% on 55%)  Gen: mildly tachypnic, speaking in full sentences HEENT: NCAT, EOMi PULM: Wheezing bilatrally, few crackles CV: RRR, distant heart sounds, cannot assess JVD AB: BS+, soft, nontender Ext: warm no edema on my exam in legs Neuro: A&Ox4, maew  I have reviewed all CT scans of her lungs dating back to 2004.  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  She was advised to quit smoking but unfortunately she continued.  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  She now has an acute exacerbation of hypoxemia with cough, mucus production, and subjective fever that coincided with the onset of macrobid.  She says she has had symptoms for one month which is about the time that she started taking the macrobid.    My ddx includes acute on chronic lung disease from macrobid vs less likely an acute flare of underlying hypersensitivity pneumonitis or atypical infection (such as crypto, given bird exposure).  One would expect fevers now with active infection and we would also expect  fairly rapid improvement in hospital if this was hypersensitivity as we have removed her from the underlying exposing cause.  Rec: Hold macrobid indefinitely Coninue steroids F/U sputum culture > would not ignore yeast if positive (crypto?) Send hypersensitivity pneumonitis panel and serum crypto antigen/Ab and antiphospholipid Ab panel  Wean O2 for O2 saturation> 88%, expect transient desaturation I would not perform a bronchoscopy as I think it would be more harmful than good.  It would not aid in diagnosis of ILD (doubt sarcoid) and we can look for HP and crypto initially with (albeit fairly low yield) serum tests. If she has not improved by next week or if she decompensates sooner I would obtain tissue  Roselie Awkward, MD [place] [facility] Pager: [phone] Cell: [phone] If no response, call [phone]

## 2014-03-23 LAB — CRYPTOCOCCAL ANTIGEN: Crypto Ag: NEGATIVE

## 2014-03-23 LAB — ANCA TITERS: Atypical P-ANCA titer: <1:20 {titer}

## 2014-03-23 NOTE — Progress Notes (Signed)
Subjective: [name] feels "better" today. She thinks her breathing is a bit easier today, and she says, "I think I might make it".   Interval Events:  - Maintaining saturations in the high 90s-100 on partial rebreather; desaturated to low 80s on venti-mask yesterday and found it uncomfortable on her face  Objective: Vital signs in last 24 hours: Filed Vitals:   03/22/14 2054 03/23/14 0000 03/23/14 0143 03/23/14 0400  BP:      Pulse:      Temp:  98.1 F (36.7 C)  98.5 F (36.9 C)  TempSrc:  Axillary  Axillary  Resp:      Height:      Weight:      SpO2: 92%  96%    Weight change:   Intake/Output Summary (Last 24 hours) at 03/23/14 0803 Last data filed at 03/23/14 0050  Gross per 24 hour  Intake    120 ml  Output    550 ml  Net   -430 ml   Physical Exam: Appearance: Morbidly obese, breathing comfortably on partial rebreather (actually has it completely off, eating a graham cracker at beginning of exam), appears comfortable and more relaxed today HEENT: AT/[place], PERRL, no lymphadenopathy Heart: RRR, 3/6 systolic ejection murmur Lungs: Decreased but present diffuse rhonchi with expiratory wheezing (again decreased from yesterday's exam) Abdomen: BS+, obese, soft, nontender Extremities: Trace BLE edema Neurologic: A&Ox3, grossly intact Skin: No rashes or lesions  Lab Results: Basic Metabolic Panel:  Recent Labs Lab 03/18/14 0613 03/21/14 0320  NA 138 142  K 4.0 4.4  CL 102 102  CO2 29 32  GLUCOSE 119* 153*  BUN 9 17  CREATININE 0.69 0.74  CALCIUM 8.0* 8.7   CBC:  Recent Labs Lab 03/18/14 0613  03/21/14 0320 03/22/14 0423  WBC 11.5*  < > 23.2* 18.5*  NEUTROABS 8.5*  --  21.7*  --   HGB 8.8*  < > 8.9* 9.2*  HCT 30.2*  < > 32.1* 32.7*  MCV 78.9  < > 80.0 82.0  PLT 274  < > 331 361  < > = values in this interval not displayed.  Thyroid Function Tests:  Recent Labs Lab 03/20/14 0553  TSH 2.867   Urine Drug Screen: Drugs of Abuse       Component Value Date/Time   LABOPIA POSITIVE* 03/25/2008 2325   COCAINSCRNUR NONE DETECTED 03/25/2008 2325   LABBENZ POSITIVE* 03/25/2008 2325   AMPHETMU NONE DETECTED 03/25/2008 2325   THCU NONE DETECTED 03/25/2008 2325   LABBARB  03/25/2008 2325    NONE DETECTED        DRUG SCREEN FOR MEDICAL PURPOSES ONLY.  IF CONFIRMATION IS NEEDED FOR ANY PURPOSE, NOTIFY LAB WITHIN 5 DAYS.        LOWEST DETECTABLE LIMITS FOR URINE DRUG SCREEN Drug Class       Cutoff (ng/mL) Amphetamine      1000 Barbiturate      200 Benzodiazepine   711 Tricyclics       657 Opiates          300 Cocaine          300 THC              50     Micro Results: Recent Results (from the past 240 hour(s))  Culture, blood (routine x 2) Call MD if unable to obtain prior to antibiotics being given     Status: None   Collection Time: 03/13/14 10:35 PM  Result Value  Ref Range Status   Specimen Description BLOOD LEFT ARM  Final   Special Requests BOTTLES DRAWN AEROBIC AND ANAEROBIC 5CC  Final   Culture   Final    NO GROWTH 5 DAYS Performed at [facility]    Report Status 03/20/2014 FINAL  Final  Culture, blood (routine x 2) Call MD if unable to obtain prior to antibiotics being given     Status: None   Collection Time: 03/13/14 10:40 PM  Result Value Ref Range Status   Specimen Description BLOOD RIGHT ARM  Final   Special Requests BOTTLES DRAWN AEROBIC AND ANAEROBIC 5CC  Final   Culture   Final    NO GROWTH 5 DAYS Performed at [facility]    Report Status 03/20/2014 FINAL  Final  MRSA PCR Screening     Status: None   Collection Time: 03/14/14  3:25 AM  Result Value Ref Range Status   MRSA by PCR NEGATIVE NEGATIVE Final    Comment:        The GeneXpert MRSA Assay (FDA approved for NASAL specimens only), is one component of a comprehensive MRSA colonization surveillance program. It is not intended to diagnose MRSA infection nor to guide or monitor treatment for MRSA infections.    Culture, sputum-assessment     Status: None   Collection Time: 03/14/14  4:04 PM  Result Value Ref Range Status   Specimen Description SPUTUM  Final   Special Requests Normal  Final   Sputum evaluation   Final    MICROSCOPIC FINDINGS SUGGEST THAT THIS SPECIMEN IS NOT REPRESENTATIVE OF LOWER RESPIRATORY SECRETIONS. PLEASE RECOLLECT. NOTIFIED Dallie Piles 03/14/14 1722 M [facility]    Report Status 03/14/2014 FINAL  Final  Culture, expectorated sputum-assessment     Status: None   Collection Time: 03/15/14 12:29 PM  Result Value Ref Range Status   Specimen Description SPUTUM  Final   Special Requests Normal  Final   Sputum evaluation   Final    THIS SPECIMEN IS ACCEPTABLE. RESPIRATORY CULTURE REPORT TO FOLLOW.   Report Status 03/15/2014 FINAL  Final  Culture, respiratory (NON-Expectorated)     Status: None   Collection Time: 03/15/14 12:29 PM  Result Value Ref Range Status   Specimen Description SPUTUM  Final   Special Requests NONE  Final   Gram Stain   Final    RARE WBC PRESENT, PREDOMINANTLY PMN RARE SQUAMOUS EPITHELIAL CELLS PRESENT NO ORGANISMS SEEN Performed at [facility]    Culture   Final    NORMAL OROPHARYNGEAL FLORA Performed at [facility]    Report Status 03/17/2014 FINAL  Final   Studies/Results: Dg Chest Port 1 View  03/22/2014   CLINICAL DATA:  Hypoxia.  EXAM: PORTABLE CHEST - 1 VIEW  COMPARISON:  03/20/2014.  FINDINGS: Mediastinum and hilar structures normal. Stable cardiomegaly. Persistent dense consolidation of both lungs. No pleural effusion or pneumothorax. Prior cervical spine fusion.  IMPRESSION: 1. Persistent dense consolidation of both lungs. No interim clearing. 2. Persistent cardiomegaly.   Electronically Signed   By: Marcello Moores  Register   On: 03/22/2014 07:11   Medications: I have reviewed the patient's current medications. Scheduled Meds: . antiseptic oral rinse  7 mL Mouth Rinse BID  . benzonatate  100 mg Oral TID  . budesonide  0.25  mg Nebulization 4 times per day  . doxycycline (VIBRAMYCIN) IV  100 mg Intravenous Q12H  . enoxaparin  100 mg Subcutaneous BID  . ferrous sulfate  325 mg Oral  TID WC  . gabapentin  100 mg Oral TID  . ipratropium-albuterol  3 mL Nebulization Q6H  . lamoTRIgine  200 mg Oral q morning - 10a  . LORazepam  1 mg Oral q morning - 10a  . LORazepam  2 mg Oral QHS  . methylPREDNISolone (SOLU-MEDROL) injection  80 mg Intravenous Q12H  . nicotine  14 mg Transdermal QHS  . pantoprazole  40 mg Oral Daily  . pravastatin  80 mg Oral Daily  . QUEtiapine  400 mg Oral QHS  . sertraline  200 mg Oral q morning - 10a  . sodium chloride  3 mL Intravenous Q12H  . vitamin C  500 mg Oral Daily  . 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   Acute on Chronic Hypoxic Respiratory Failure: On this admission, patient was initially short of breath with superimposed infiltrate on CXR, but did not respond to ceftriaxone/azithromycin antibiotic therapy. Her CT then revealed worsened chronic respiratory  pneumonitis. 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 She has had the bird removed from her house. Her procalcitonin rose to 1.46 (from 0.39 on 2/29) and her WBC have been 11-->23-->18. This is likely an acute flare of her chronic pneumonitis. Patient is currently afebrile (98.5). Crypto antigen negative. RF elevated (17.3). Mpo/pr-3 antibody and anca proteinase 3 WNL. Hypersensitivity panel, antiphospholipid syndrom eval, ANCA pending. ESR elevated at 68.  - Appreciate PCCM consult; no bronchoscopy at this time (more harmful than good), but if decompensates or does not improve by next week, obtain tissue - Attempt trial on venturi mask again today for saturations above 85-88%; expect transient desaturation - Continue incentive spirometer and pulmonary hygiene  - OOB BID - Continue PT and OT; patient not interested in a facility after this hospitalization, but will participate in therapy from home - Continue doxycycline - Continue solu-medrol 80 mg q12 hours - Continue nebs (duonebs q6 hours, pulmicort q6 hours and albuterol q2 hours PRN) - Continue Robitussin-DM q6 hours PRN - Continue Tessalon Perles TID - Continue tylenol 500 mg PRN q 6 hours - Repeat sputum culture (needs to be collected) - Needs Prevnar vaccine as outpatient - Patient will need high-resolution CT after acute resolution  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 ACE 95 (H) 6 years ago but 50 (WNL) on this admission. - Consider transbronchial LN biopsy in the future  Constipation and Hair Loss: TSH normal. Constipation may be secondary to opioids or iron supplementation. - Senokot-S BID PRN - Miralax daily  Chronic Iron Deficiency Anemia: Hemoglobin 8.9-->9.2 (back to  baseline). MCV 82.0. - Continue to trend - Continue oral iron supplementation TID  Urge and Stress Incontinence: Macrobid has been stopped, as this has an  association with pulmonary hypersensitivity. Patient had been on it for her chronic colonization because she was to receive a urodynamic study as an outpatient.  GERD:  - Continue home protonix 40 mg by mouth daily  Depression and Anxiety: Anxiety about health has been present on this admission (dreams about death etc). - Continue home Ativan 1 mg by mouth in the am and 2 mg by mouth at night - Continue home Lamictal, Seroquel and Zoloft  Chronic Low Back Pain: Patient has not been using her PRN Norco. - Continue home Norco q6 hours PRN   Tobacco Abuse: Current smoker.  - Cessation counseling - Nicotine patch  Vitamin D Deficiency: 15 - Started ergo 50 U weekly  History of DVT and PE:  - Continue lovenox 100 mg BID  Diet: Regular  Dispo: Disposition is deferred at this time, awaiting improvement of current medical problems.  Anticipated discharge in approximately 2-3 day(s).   The patient does have a current PCP Karren Cobble, MD) and does need an [facility] hospital follow-up appointment after discharge.  The patient does not have transportation limitations that hinder transportation to clinic appointments.  .Services Needed at time of discharge: Y = Yes, Blank = No PT:   OT:   RN:   Equipment:   Other:     LOS: 10 days   Drucilla Schmidt, MD 03/23/2014, 8:03 AM

## 2014-03-24 ENCOUNTER — Inpatient Hospital Stay (HOSPITAL_COMMUNITY): Payer: Medicare Other

## 2014-03-24 LAB — CBC
HCT: 32.3 % — ABNORMAL LOW (ref 36.0–46.0)
Hemoglobin: 9.2 g/dL — ABNORMAL LOW (ref 12.0–15.0)
MCH: 23 pg — ABNORMAL LOW (ref 26.0–34.0)
MCHC: 28.5 g/dL — AB (ref 30.0–36.0)
MCV: 80.8 fL (ref 78.0–100.0)
Platelets: 354 10*3/uL (ref 150–400)
RBC: 4 MIL/uL (ref 3.87–5.11)
RDW: 18.7 % — AB (ref 11.5–15.5)
WBC: 12.7 10*3/uL — AB (ref 4.0–10.5)

## 2014-03-24 LAB — BASIC METABOLIC PANEL
Anion gap: 6 (ref 5–15)
BUN: 16 mg/dL (ref 6–23)
CHLORIDE: 103 mmol/L (ref 96–112)
CO2: 31 mmol/L (ref 19–32)
CREATININE: 0.59 mg/dL (ref 0.50–1.10)
Calcium: 8.4 mg/dL (ref 8.4–10.5)
GFR calc non Af Amer: >90 mL/min (ref >90)
GLUCOSE: 129 mg/dL — AB (ref 70–99)
Potassium: 4 mmol/L (ref 3.5–5.1)
Sodium: 140 mmol/L (ref 135–145)

## 2014-03-24 MED ORDER — IPRATROPIUM-ALBUTEROL 0.5-2.5 (3) MG/3ML IN SOLN
RESPIRATORY_TRACT | Status: AC
  Filled 2014-03-24: qty 3

## 2014-03-24 NOTE — Progress Notes (Signed)
OT Cancellation Note  Patient Details Name: [name] MRN: [id number] DOB: 1945/12/24   Cancelled Treatment:    Reason Eval/Treat Not Completed:  Pt placed on venti mask from NRB. Declining OOB stating she wanted to see the doctor first. Pt educated on the benefits of OOB activity.  Will try back as schedule allows.  Malka So 03/24/2014, 9:09 AM

## 2014-03-24 NOTE — Progress Notes (Signed)
Patient refuses CPAP at this time. RT will continue to monitor. RN placed on NRB while sleeping to maintain spO2.

## 2014-03-24 NOTE — Progress Notes (Signed)
PT Cancellation Note  Patient Details Name: [name] MRN: [id number] DOB: November 15, 1945   Cancelled Treatment:    Reason Eval/Treat Not Completed: OT and RN with pt when PT arrived. Pt placed on venti-mask from NRB and is declining any OOB activity at this time. Will check back as schedule allows.    Rolinda Roan 03/24/2014, 9:15 AM  Rolinda Roan, PT, DPT Acute Rehabilitation Services Pager: [phone]

## 2014-03-24 NOTE — Progress Notes (Signed)
Pt refuseing CPap for the night. Pt reminded that if she changes her mind to notify RN and she would call this RT. Pt found with SATs of 82% on 50% venturi mask. Pt stated Dr. Michela Pitcher 85 was ok. SATs have ran 80_89 on 03/24/14. Will monotor pt.

## 2014-03-24 NOTE — Progress Notes (Signed)
No new complaints No distress Able to wean FiO2 some today  Filed Vitals:   03/24/14 0922 03/24/14 1036 03/24/14 1054 03/24/14 1144  BP:    131/57  Pulse:  81 84 80  Temp:    97.7 F (36.5 C)  TempSrc:    Oral  Resp:  22 27 19   Height:      Weight:      SpO2: 89% 92% 80% 90%   NAD HEENT WNL Cannot visualize JVP Bilateral crackles, no wheezes RRR Obese, soft, NT No LE edema  I have reviewed all of today's lab results. Relevant abnormalities are discussed in the A/P section   CXR: mild improvement in aeration  IMPRESSION: Acute on chronic hypoxic respiratory failure - perhaps slightly improved Underlying chronic interstitial fibrosis Diffuse pulmonary infiltrates /pneumonitis  PLAN/REC: Cont doxy 7-10 days total Cont methylpred @ current dose through WE Wean O2 as able keeping SpO2 88-94% Recheck CXR AM 3/07  PCCM will see PRN this WE (call if needed) and will see again Monday 3/07   Merton Border, MD ; [facility] service Mobile [phone].  After 5:30 PM or weekends, call [phone]

## 2014-03-24 NOTE — Progress Notes (Signed)
Physical Therapy Treatment Patient Details Name: [name] MRN: [id number] DOB: 1945/02/23 Today's Date: 03/24/2014    History of Present Illness 69 yo female with onset of fever, cough, SOB was admitted with acute respiratory failure and CAP. Transfered to step down due increased WOB 03/20/14 and placed on NRB.    PT Comments    Pt progressing slowly towards physical therapy goals. Pt required increased motivation to sit up in the recliner at end of session, and declined any attempt at ambulation. Overall does well with transfers, and +2 was available for safety. PT continues to recommend SNF at d/c. Will continue to follow.   Follow Up Recommendations  SNF;Supervision/Assistance - 24 hour     Equipment Recommendations  Rolling walker with 5" wheels    Recommendations for Other Services       Precautions / Restrictions Precautions Precautions: Fall Restrictions Weight Bearing Restrictions: No    Mobility  Bed Mobility Overal bed mobility: Needs Assistance Bed Mobility: Supine to Sit     Supine to sit: Min guard;+2 for safety/equipment;HOB elevated     General bed mobility comments: VC's for hand placement and technique. Pt was able to transition to EOB with increased time and use of bed rails for assist.   Transfers Overall transfer level: Needs assistance Equipment used: None Transfers: Sit to/from [employer] Sit to Stand: Min guard;+2 safety/equipment   Squat pivot transfers: Min guard;+2 safety/equipment     General transfer comment: Pt did not want therapist's assist and declined having gait belt donned. Pt was able to move bed to [facility], and BSC to recliner without physical assist. Pt did not actually come to full standing, however squatted as she pivoted around each time.   Ambulation/Gait             General Gait Details: Pt declined to attempt any ambulation.    Stairs            Wheelchair Mobility    Modified Rankin  (Stroke Patients Only)       Balance Overall balance assessment: Needs assistance Sitting-balance support: Feet supported;No upper extremity supported Sitting balance-Leahy Scale: Fair     Standing balance support: Bilateral upper extremity supported;During functional activity Standing balance-Leahy Scale: Poor Standing balance comment: Pt requires UE support to maintain standing balance.                     Cognition Arousal/Alertness: Awake/alert Behavior During Therapy: Anxious Overall Cognitive Status: Within Functional Limits for tasks assessed                      Exercises      General Comments        Pertinent Vitals/Pain Pain Assessment: No/denies pain    Home Living                      Prior Function            PT Goals (current goals can now be found in the care plan section) Acute Rehab PT Goals Patient Stated Goal: to go home not to rehab PT Goal Formulation: With patient Time For Goal Achievement: 03/30/14 Potential to Achieve Goals: Good Progress towards PT goals: Progressing toward goals    Frequency  Min 3X/week    PT Plan Current plan remains appropriate    Co-evaluation PT/OT/SLP Co-Evaluation/Treatment: Yes Reason for Co-Treatment: Complexity of the patient's impairments (multi-system involvement);For patient/therapist safety PT goals addressed  during session: Mobility/safety with mobility;Balance       End of Session Equipment Utilized During Treatment: Oxygen Activity Tolerance: Patient tolerated treatment well Patient left: in chair;with call bell/phone within reach     Time: 1430-1458 PT Time Calculation (min) (ACUTE ONLY): 28 min  Charges:  $Therapeutic Activity: 8-22 mins                    G Codes:      Rolinda Roan 04-08-2014, 3:28 PM  Rolinda Roan, PT, DPT Acute Rehabilitation Services Pager: [phone]

## 2014-03-24 NOTE — Progress Notes (Signed)
Occupational Therapy Treatment Patient Details Name: [name] MRN: [id number] DOB: [birth year] Today's Date: 03/24/2014    History of present illness 69 yo female with onset of fever, cough, SOB was admitted with acute respiratory failure and CAP. Transfered to step down due increased WOB 03/20/14 and placed on NRB.   OT comments  Pt agreeable to use of BSC instead of bedpan and then performed second transfer to stay up in chair.  Moves with min guard assist for OOB with second person for safety and lines. Pt directing all aspects of therapy session. SNF is the optimal d/c environment for continued therapy.  Follow Up Recommendations  Home health OT (pt is declining SNF)    Equipment Recommendations  None recommended by OT    Recommendations for Other Services      Precautions / Restrictions Precautions Precautions: Fall Precaution Comments: watch sats Restrictions Weight Bearing Restrictions: No       Mobility Bed Mobility Overal bed mobility: Needs Assistance Bed Mobility: Supine to Sit     Supine to sit: Min guard;+2 for safety/equipment;HOB elevated     General bed mobility comments: VC's for hand placement and technique. Pt was able to transition to EOB with increased time and use of bed rails for assist.   Transfers Overall transfer level: Needs assistance Equipment used: None Transfers: Sit to/from [employer] Sit to Stand: Min guard;+2 safety/equipment   Squat pivot transfers: Min guard;+2 safety/equipment     General transfer comment: Pt did not want therapist's assist and declined having gait belt donned. Pt was able to move bed to [facility], and BSC to recliner without physical assist. Pt did not actually come to full standing, however squatted as she pivoted around each time.     Balance Overall balance assessment: Needs assistance Sitting-balance support: Feet supported;No upper extremity supported Sitting balance-Leahy Scale: Fair     Standing balance support: Bilateral upper extremity supported;During functional activity Standing balance-Leahy Scale: Poor Standing balance comment: Pt requires UE support to maintain standing balance.                    ADL Overall ADL's : Needs assistance/impaired                 Upper Body Dressing : Set up;Sitting       Toilet Transfer: +2 for physical assistance;Min guard;BSC;Requires wide/bariatric;Stand-pivot   Toileting- Clothing Manipulation and Hygiene: Total assistance;Sit to/from stand         General ADL Comments: Pt intensely negative and controlling of set up, managment of lines and activity.  Agreeable to staying up in chair after toileting and gown change.      Vision                     Perception     Praxis      Cognition   Behavior During Therapy: Anxious Overall Cognitive Status: Within Functional Limits for tasks assessed                       Extremity/Trunk Assessment               Exercises     Shoulder Instructions       General Comments      Pertinent Vitals/ Pain       Pain Assessment: No/denies pain  Home Living  Prior Functioning/Environment              Frequency Min 2X/week     Progress Toward Goals  OT Goals(current goals can now be found in the care plan section)  Progress towards OT goals: Progressing toward goals  Acute Rehab OT Goals Patient Stated Goal: to go home not to rehab  Plan Discharge plan remains appropriate    Co-evaluation    PT/OT/SLP Co-Evaluation/Treatment: Yes Reason for Co-Treatment: Complexity of the patient's impairments (multi-system involvement) PT goals addressed during session: Mobility/safety with mobility;Balance OT goals addressed during session: ADL's and self-care      End of Session Equipment Utilized During Treatment: Oxygen (100% NRB)   Activity Tolerance Patient tolerated  treatment well   Patient Left in chair;with call bell/phone within reach   Nurse Communication Mobility status (activity tolerance)        Time: 4695-0722 OT Time Calculation (min): 28 min  Charges: OT General Charges $OT Visit: 1 Procedure OT Treatments $Self Care/Home Management : 8-22 mins  Malka So 03/24/2014, 3:43 PM  [phone]

## 2014-03-24 NOTE — Progress Notes (Signed)
Subjective: [name] feels fine this morning. She is in the middle of a breathing treatment.  Interval Events:  - Satted from 88-93% yesterday during the day on the venti-mask - Desaturated to 80% when sleeping; switched from venti-mask to parial-rebreather overnight   Objective: Vital signs in last 24 hours: Filed Vitals:   03/23/14 1948 03/23/14 2033 03/23/14 2341 03/24/14 0400  BP:   96/46 129/62  Pulse: 88  86 88  Temp: 98.6 F (37 C)  98 F (36.7 C) 98.2 F (36.8 C)  TempSrc: Axillary  Axillary Oral  Resp: _0 Height:      Weight:      SpO2: 90% 91%  93%   Weight change:   Intake/Output Summary (Last 24 hours) at 03/24/14 0605 Last data filed at 03/23/14 2005  Gross per 24 hour  Intake   1330 ml  Output    400 ml  Net    930 ml   Physical Exam: Appearance: Morbidly obese, in the middle of a breathing treatment, about to be taken for CXR HEENT: AT/[place], PERRL, no lymphadenopathy Heart: RRR, 3/6 systolic ejection murmur Lungs: Decreased but present diffuse rhonchi with expiratory wheezing (decreased from prior exams) Abdomen: BS+, obese, soft, nontender Extremities: Trace BLE edema Neurologic: A&Ox3, grossly intact Skin: No rashes or lesions  Lab Results: Basic Metabolic Panel:  Recent Labs Lab 03/18/14 0613 03/21/14 0320  NA 138 142  K 4.0 4.4  CL 102 102  CO2 29 32  GLUCOSE 119* 153*  BUN 9 17  CREATININE 0.69 0.74  CALCIUM 8.0* 8.7   CBC:  Recent Labs Lab 03/18/14 0613  03/21/14 0320 03/22/14 0423  WBC 11.5*  < > 23.2* 18.5*  NEUTROABS 8.5*  --  21.7*  --   HGB 8.8*  < > 8.9* 9.2*  HCT 30.2*  < > 32.1* 32.7*  MCV 78.9  < > 80.0 82.0  PLT 274  < > 331 361  < > = values in this interval not displayed.  Thyroid Function Tests:  Recent Labs Lab 03/20/14 0553  TSH 2.867   Urine Drug Screen: Drugs of Abuse     Component Value Date/Time   LABOPIA POSITIVE* 03/25/2008 2325   COCAINSCRNUR NONE DETECTED 03/25/2008 2325   LABBENZ POSITIVE* 03/25/2008 2325   AMPHETMU NONE DETECTED 03/25/2008 2325   THCU NONE DETECTED 03/25/2008 2325   LABBARB  03/25/2008 2325    NONE DETECTED        DRUG SCREEN FOR MEDICAL PURPOSES ONLY.  IF CONFIRMATION IS NEEDED FOR ANY PURPOSE, NOTIFY LAB WITHIN 5 DAYS.        LOWEST DETECTABLE LIMITS FOR URINE DRUG SCREEN Drug Class       Cutoff (ng/mL) Amphetamine      1000 Barbiturate      200 Benzodiazepine   716 Tricyclics       967 Opiates          300 Cocaine          300 THC              50     Micro Results: Recent Results (from the past 240 hour(s))  Culture, sputum-assessment     Status: None   Collection Time: 03/14/14  4:04 PM  Result Value Ref Range Status   Specimen Description SPUTUM  Final   Special Requests Normal  Final   Sputum evaluation   Final    MICROSCOPIC FINDINGS SUGGEST THAT THIS SPECIMEN  IS NOT REPRESENTATIVE OF LOWER RESPIRATORY SECRETIONS. PLEASE RECOLLECT. NOTIFIED Dallie Piles 03/14/14 1722 M [facility]    Report Status 03/14/2014 FINAL  Final  Culture, expectorated sputum-assessment     Status: None   Collection Time: 03/15/14 12:29 PM  Result Value Ref Range Status   Specimen Description SPUTUM  Final   Special Requests Normal  Final   Sputum evaluation   Final    THIS SPECIMEN IS ACCEPTABLE. RESPIRATORY CULTURE REPORT TO FOLLOW.   Report Status 03/15/2014 FINAL  Final  Culture, respiratory (NON-Expectorated)     Status: None   Collection Time: 03/15/14 12:29 PM  Result Value Ref Range Status   Specimen Description SPUTUM  Final   Special Requests NONE  Final   Gram Stain   Final    RARE WBC PRESENT, PREDOMINANTLY PMN RARE SQUAMOUS EPITHELIAL CELLS PRESENT NO ORGANISMS SEEN Performed at [facility]    Culture   Final    NORMAL OROPHARYNGEAL FLORA Performed at [facility]    Report Status 03/17/2014 FINAL  Final   Studies/Results: No results found. Medications: I have reviewed the patient's current  medications. Scheduled Meds: . antiseptic oral rinse  7 mL Mouth Rinse BID  . benzonatate  100 mg Oral TID  . budesonide  0.25 mg Nebulization 4 times per day  . doxycycline (VIBRAMYCIN) IV  100 mg Intravenous Q12H  . enoxaparin  100 mg Subcutaneous BID  . ferrous sulfate  325 mg Oral TID WC  . gabapentin  100 mg Oral TID  . ipratropium-albuterol  3 mL Nebulization Q6H  . lamoTRIgine  200 mg Oral q morning - 10a  . LORazepam  1 mg Oral q morning - 10a  . LORazepam  2 mg Oral QHS  . methylPREDNISolone (SOLU-MEDROL) injection  80 mg Intravenous Q12H  . nicotine  14 mg Transdermal QHS  . pantoprazole  40 mg Oral Daily  . pravastatin  80 mg Oral Daily  . QUEtiapine  400 mg Oral QHS  . sertraline  200 mg Oral q morning - 10a  . sodium chloride  3 mL Intravenous Q12H  . vitamin C  500 mg Oral Daily  . 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   Acute on Chronic Hypoxic Respiratory Failure: On this admission, patient was initially short of breath with superimposed infiltrate on  CXR, but did not respond to ceftriaxone/azithromycin antibiotic therapy. Her CT then revealed worsened chronic respiratory pneumonitis. 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 She has had the bird removed from her house. Her procalcitonin was elevated to 1.46 and her WBC have  been 11-->23-->18.5. This is likely an acute flare of her chronic pneumonitis. Patient is currently afebrile (98.2). Crypto antigen negative. RF elevated (17.3). ANCA WNL. Mpo/pr-3 antibody and anca proteinase 3 WNL. Hypersensitivity panel, antiphospholipid syndrom eval pending. ESR elevated at 68. Patient appears to be tolerating venti-mask while awake, but needs more support while sleeping. - Appreciate PCCM consult; no bronchoscopy at this time (more harmful than good), but if decompensates or does not improve by next week, obtain tissue - Resume venturi mask again today once patient is back from CXR for saturations above 85-88%; expect transient desaturation - F/u CXR - Continue incentive spirometer and pulmonary hygiene  - OOB BID - Continue PT and OT; patient not interested in a facility after this hospitalization, but will participate in therapy from home - Continue doxycycline - Continue solu-medrol 80 mg q12 hours - Continue nebs (duonebs q6 hours, pulmicort q6 hours and albuterol q2 hours PRN) - Continue Robitussin-DM q6 hours PRN - Continue Tessalon Perles TID - Continue tylenol 500 mg PRN q 6 hours - Repeat sputum culture (needs to be collected) - Needs Prevnar vaccine as outpatient - Patient will need high-resolution CT after acute resolution - Consider transbronchial LN biopsy in the future for bilateral hilar and mediastinal LN with splenomegaly  Constipation and Hair Loss: TSH normal. Constipation may be secondary to opioids or iron supplementation. - Senokot-S BID PRN - Miralax daily  Chronic Iron Deficiency Anemia: Hemoglobin 8.9-->9.2 (back to baseline). MCV  82.0. - Continue to trend - Continue oral iron supplementation TID  Urge and Stress Incontinence: Macrobid has been stopped, as this has an association with pulmonary hypersensitivity. Patient had been on it for her chronic colonization because she was to receive a urodynamic study as an outpatient.  GERD:  - Continue home protonix 40 mg by mouth daily  Depression and Anxiety: Anxiety about health has been present on this admission; improved somewhat. - Continue home Ativan 1 mg by mouth in the am and 2 mg by mouth at night - Continue home Lamictal, Seroquel and Zoloft  Chronic Low Back Pain: Patient has not been using her PRN Norco. - Continue home Norco q6 hours PRN   Tobacco Abuse: Current smoker.  - Cessation counseling - Nicotine patch in place  Vitamin D Deficiency: 15 - Started ergo 50 U weekly  History of DVT and PE:  - Continue lovenox 100 mg BID  Diet: Regular  Dispo: Disposition is deferred at this time, awaiting improvement of current medical problems.  Anticipated discharge in approximately 2-3 day(s).   The patient does have a current PCP Karren Cobble, MD) and does need an [facility] hospital follow-up appointment after discharge.  The patient does not have transportation limitations that hinder transportation to clinic appointments.  .Services Needed at time of discharge: Y = Yes, Blank = No PT:   OT:   RN:   Equipment:   Other:     LOS: 11 days   Drucilla Schmidt, MD 03/24/2014, 6:05 AM

## 2014-03-25 NOTE — Progress Notes (Signed)
Subjective: Patient doing well on Venti Mask- saturating- 91-95% while I was in room. Was initially sleeping. Feels she is breathing much better since she started the Steroids. Patient wanted to know what would have been done if she could not breath, and i explained to her she would have been intubated. Patient is okay with that for now, but says she will like to talk to her son about it.  Objective: Vital signs in last 24 hours: Filed Vitals:   03/25/14 0557 03/25/14 0700 03/25/14 0742 03/25/14 0917  BP:   121/58   Pulse:   74   Temp:   98.3 F (36.8 C)   TempSrc:   Axillary   Resp:   23   Height:      Weight: 324 lb 4.8 oz (147.1 kg)     SpO2:  86% 91% 94%   Weight change:   Intake/Output Summary (Last 24 hours) at 03/25/14 1034 Last data filed at 03/25/14 0835  Gross per 24 hour  Intake   1073 ml  Output    300 ml  Net    773 ml   Physical Exam: Appearance: Morbidly obese, Sleepy but easily arousable. HEENT: AT/[place] Heart: RRR, 3/6 systolic ejection murmur Lungs: Coarse rhonchrous breathing, but equal bilat Abdomen: BS+, obese, soft, nontender Extremities: Trace BLE edema, no tenderness. Neurologic: A&Ox3  Lab Results: Basic Metabolic Panel:  Recent Labs Lab 03/21/14 0320 03/24/14 1040  NA 142 140  K 4.4 4.0  CL 102 103  CO2 32 31  GLUCOSE 153* 129*  BUN 17 16  CREATININE 0.74 0.59  CALCIUM 8.7 8.4   CBC:  Recent Labs Lab 03/21/14 0320 03/22/14 0423 03/24/14 1040  WBC 23.2* 18.5* 12.7*  NEUTROABS 21.7*  --   --   HGB 8.9* 9.2* 9.2*  HCT 32.1* 32.7* 32.3*  MCV 80.0 82.0 80.8  PLT 331 361 354    Thyroid Function Tests:  Recent Labs Lab 03/20/14 0553  TSH 2.867    Studies/Results: Dg Chest 2 View  03/24/2014   CLINICAL DATA:  Congestion  EXAM: CHEST  2 VIEW  COMPARISON:  03/22/2014  FINDINGS: Cardiomegaly is again identified. Diffuse bilateral infiltrates are again identified although some improved aeration in the left midlung is noted. No  new focal abnormality is seen. Postsurgical changes in the cervical spine are noted.  IMPRESSION: Slight improved aeration on the left. Diffuse bilateral infiltrates remain.   Electronically Signed   By: Inez Catalina M.D.   On: 03/24/2014 09:56   Medications: I have reviewed the patient's current medications. Scheduled Meds: . antiseptic oral rinse  7 mL Mouth Rinse BID  . benzonatate  100 mg Oral TID  . budesonide  0.25 mg Nebulization 4 times per day  . doxycycline (VIBRAMYCIN) IV  100 mg Intravenous Q12H  . enoxaparin  100 mg Subcutaneous BID  . ferrous sulfate  325 mg Oral TID WC  . gabapentin  100 mg Oral TID  . ipratropium-albuterol  3 mL Nebulization Q6H  . lamoTRIgine  200 mg Oral q morning - 10a  . LORazepam  1 mg Oral q morning - 10a  . LORazepam  2 mg Oral QHS  . methylPREDNISolone (SOLU-MEDROL) injection  80 mg Intravenous Q12H  . nicotine  14 mg Transdermal QHS  . pantoprazole  40 mg Oral Daily  . pravastatin  80 mg Oral Daily  . QUEtiapine  400 mg Oral QHS  . sertraline  200 mg Oral q morning - 10a  .  sodium chloride  3 mL Intravenous Q12H  . vitamin C  500 mg Oral Daily  . Vitamin D (Ergocalciferol)  50,000 Units Oral Q7 days   Assessment/Plan:  Ms. 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   Acute on Chronic Hypoxic Respiratory Failure: Being managed for Hypersensivity Pneumonitis- Exposure to birds and MAcrobid. Initial concern for infection- with initial 3 days of Coverage for CAP.  WBC trending down, 12.7 from 23 four days ago. Presently on Doxycycline and Steroid. Chest Xray- 03/23/2013- showed some improvement. - IV steroids- Solumedrol- 80mg  BID.  -IV Doxycycline- Day 6. - Appreciate PCCM consult;  -Cont venturi mask, maintain O2 sats > 85%; expect transient desaturation - F/u CXR - PT and OT- recs- SNF - Continue nebs (duonebs q6 hours, pulmicort q6  hours and albuterol q2 hours PRN) - Robitussin-DM q6 hours PRN - Continue Tessalon Perles TID - Continue tylenol 500 mg PRN q 6 hours - Needs Prevnar vaccine as outpatient - Patient will need high-resolution CT after acute resolution - Consider transbronchial LN biopsy in the future for bilateral hilar and mediastinal LN with splenomegaly  Constipation and Hair Loss: TSH normal. Constipation may be secondary to opioids or iron supplementation. - Senokot-S BID PRN - Miralax daily  Chronic Iron Deficiency Anemia: Hemoglobin stable- 9.2. MCV 82.0. - CBC- Monday - Continue oral iron supplementation TID  Urge and Stress Incontinence: Macrobid has been stopped, as this has an association with pulmonary hypersensitivity. Patient had been on it for her chronic colonization because she was to receive a urodynamic study as an outpatient.  GERD:  - Continue home protonix 40 mg by mouth daily  Depression and Anxiety: Anxiety about health has been present on this admission; improved somewhat. - Continue home Ativan 1 mg by mouth in the am and 2 mg by mouth at night - Continue home Lamictal, Seroquel and Zoloft  Chronic Low Back Pain: Patient has not been using her PRN Norco. - Continue home Norco q6 hours PRN   Tobacco Abuse: Current smoker.  - Cessation counseling - Nicotine patch in place  Vitamin D Deficiency: 15 - Started ergo 50 U weekly  History of DVT and PE:  - Continue lovenox 100 mg BID  Diet: Regular  Dispo: Disposition is deferred at this time, awaiting improvement of current medical problems.     LOS: 12 days   [name], MD 03/25/2014, 10:34 AM

## 2014-03-25 NOTE — Progress Notes (Signed)
Called into the room by NT. Pt had desat into the 60's on venti mask at 55% when trying to place on BedPan.    Alessandra Grout, RN

## 2014-03-26 LAB — BASIC METABOLIC PANEL
Anion gap: 6 (ref 5–15)
BUN: 16 mg/dL (ref 6–23)
CO2: 33 mmol/L — ABNORMAL HIGH (ref 19–32)
Calcium: 8.7 mg/dL (ref 8.4–10.5)
Chloride: 101 mmol/L (ref 96–112)
Creatinine, Ser: 0.63 mg/dL (ref 0.50–1.10)
GFR calc Af Amer: >90 mL/min (ref >90)
GFR calc non Af Amer: 89 mL/min — ABNORMAL LOW (ref >90)
GLUCOSE: 190 mg/dL — AB (ref 70–99)
Potassium: 4.6 mmol/L (ref 3.5–5.1)
Sodium: 140 mmol/L (ref 135–145)

## 2014-03-26 LAB — CBC
HEMATOCRIT: 33.8 % — AB (ref 36.0–46.0)
Hemoglobin: 9.6 g/dL — ABNORMAL LOW (ref 12.0–15.0)
MCH: 22.4 pg — AB (ref 26.0–34.0)
MCHC: 28.4 g/dL — ABNORMAL LOW (ref 30.0–36.0)
MCV: 79 fL (ref 78.0–100.0)
PLATELETS: 400 10*3/uL (ref 150–400)
RBC: 4.28 MIL/uL (ref 3.87–5.11)
RDW: 19.2 % — AB (ref 11.5–15.5)
WBC: 13.4 10*3/uL — ABNORMAL HIGH (ref 4.0–10.5)

## 2014-03-26 NOTE — Progress Notes (Signed)
Orders were received to place a foley in pt because she desat when turning, laying flat, or trying to place bedpan. The pt desat 4 times into the 60's when turning and trying to place bedpan or clean pt. She is incontinent and has needed the bedpan and sheet changed 4 time this night, so far.  A foley time out was observed and all options explored. The pt will have foley for the next 24-48 hour and RNs/MDs will reassess need for foley based of pt respiatory condition.     Alessandra Grout, RN

## 2014-03-26 NOTE — Progress Notes (Signed)
Subjective: Pt back on non -rebreather from venti mask, as she desat overnight when she was been moved around so that she could urinate. She is saturating high 90s now, oriented. Also some complaints of pain in her left side, of her upper abdomen, intermittent, describes it like an ache, no rash.  Objective: Vital signs in last 24 hours: Filed Vitals:   03/26/14 0340 03/26/14 0406 03/26/14 0727 03/26/14 0915  BP:  104/33 115/63   Pulse: 82 78 81   Temp:  97.6 F (36.4 C) 97.5 F (36.4 C)   TempSrc:  Oral Oral   Resp: 22 16 15    Height:      Weight:      SpO2: 87% 99% 98% 97%   Weight change:   Intake/Output Summary (Last 24 hours) at 03/26/14 1014 Last data filed at 03/26/14 0730  Gross per 24 hour  Intake    870 ml  Output   1100 ml  Net   -230 ml   Physical Exam: Appearance: Morbidly obese, awake, alert HEENT: AT/[place], on O3 by venti mask. Heart: RRR Lungs: Still Coarse rhonchrous breathing slight improvement from prior. Abdomen: BS+, obese, soft, nontender,, minimal tenderness area she points to on her left upper flank area, no rash, no erythema. Extremities: No BLE edema, no tenderness. Neurologic: A&Ox3  Lab Results: Basic Metabolic Panel:  Recent Labs Lab 03/24/14 1040 03/26/14 0330  NA 140 140  K 4.0 4.6  CL 103 101  CO2 31 33*  GLUCOSE 129* 190*  BUN 16 16  CREATININE 0.59 0.63  CALCIUM 8.4 8.7   CBC:  Recent Labs Lab 03/21/14 0320  03/24/14 1040 03/26/14 0330  WBC 23.2*  < > 12.7* 13.4*  NEUTROABS 21.7*  --   --   --   HGB 8.9*  < > 9.2* 9.6*  HCT 32.1*  < > 32.3* 33.8*  MCV 80.0  < > 80.8 79.0  PLT 331  < > 354 400  < > = values in this interval not displayed.  Thyroid Function Tests:  Recent Labs Lab 03/20/14 0553  TSH 2.867    Studies/Results: No results found. Medications: I have reviewed the patient's current medications. Scheduled Meds: . antiseptic oral rinse  7 mL Mouth Rinse BID  . benzonatate  100 mg Oral TID  .  budesonide  0.25 mg Nebulization 4 times per day  . doxycycline (VIBRAMYCIN) IV  100 mg Intravenous Q12H  . enoxaparin  100 mg Subcutaneous BID  . ferrous sulfate  325 mg Oral TID WC  . gabapentin  100 mg Oral TID  . ipratropium-albuterol  3 mL Nebulization Q6H  . lamoTRIgine  200 mg Oral q morning - 10a  . LORazepam  1 mg Oral q morning - 10a  . LORazepam  2 mg Oral QHS  . methylPREDNISolone (SOLU-MEDROL) injection  80 mg Intravenous Q12H  . nicotine  14 mg Transdermal QHS  . pantoprazole  40 mg Oral Daily  . pravastatin  80 mg Oral Daily  . QUEtiapine  400 mg Oral QHS  . sertraline  200 mg Oral q morning - 10a  . sodium chloride  3 mL Intravenous Q12H  . vitamin C  500 mg Oral Daily  . 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   Acute on  Chronic Hypoxic Respiratory Failure: Being managed for Hypersensivity Pneumonitis- Exposure to birds and MAcrobid. Initial concern for infection- with initial 3 days of Coverage for CAP.  WBC 13.4 today, slight increase form 12.7 yesterday. Presently on Doxycycline and Steroid. Chest Xray- 03/23/2013- showed some improvement. - Cont IV steroids- Solumedrol- 80mg  BID.  - IV Doxycycline- Day 6. - Appreciate PCCM recs; will see patient back 03/27/2014 - Wean of rebreather back to venti mask,  maintain O2 sats > 85%; expect transient desaturation - F/u CXR- for 03/27/2014 - PT and OT- recs- SNF - Continue nebs (duonebs q6 hours, pulmicort q6 hours and albuterol q2 hours PRN) - Robitussin-DM q6 hours PRN - Continue Tessalon Perles TID - Continue tylenol 500 mg PRN q 6 hours - Needs Prevnar vaccine as outpatient - Patient will need high-resolution CT after acute resolution - At this point, considering patient has not significantly improved, a bronchoscopy, lung biopsy  might be helpful.  Constipation and Hair Loss: TSH normal. Constipation may be secondary to opioids or iron supplementation. - Senokot-S BID PRN - Miralax daily  Chronic Iron Deficiency Anemia: Hemoglobin stable- 9.2. MCV 82.0. - CBC- Monday - Continue oral iron supplementation TID  Urge and Stress Incontinence: Macrobid has been stopped, as this has an association with pulmonary hypersensitivity. Patient had been on it for her chronic colonization because she was to receive a urodynamic study as an outpatient.  GERD:  - Continue home protonix 40 mg by mouth daily  Depression and Anxiety: Anxiety about health has been present on this admission; improved somewhat. - Continue home Ativan 1 mg by mouth in the am and 2 mg by mouth at night - Continue home Lamictal, Seroquel and Zoloft  Chronic Low Back Pain: Patient has not been using her PRN Norco. - Continue home Norco q6 hours PRN   Tobacco Abuse: Current smoker.  - Cessation counseling - Nicotine patch in place  Vitamin D Deficiency: 15 - Started ergo 50 U weekly  History of DVT and PE:  - Continue lovenox 100 mg BID  Diet: Regular  Dispo: Disposition is deferred at this time, awaiting improvement of current medical problems.     LOS: 13 days   Jennifer Roys, [name] 03/26/2014, 10:14 AM

## 2014-03-26 NOTE — Progress Notes (Signed)
Patient refused CPAP at this time.  Patient states she does not wear CPAP at home.  Patient encouraged to call for respiratory if CPAP desired during hospital stay.

## 2014-03-26 NOTE — Progress Notes (Signed)
Lab came to collect pt AM labs and the pt was extremely confused. She was trying to get out of bed and didn't understand where she was. I was called into the room and the pt told me, "I need to go back to my room, this is not my room, who are these people."  I placed the pt on a Nonrebreather and notified MD of pt condition. The pt sat was at 85% on the venti mask. O2 is at 97% on the Nonrebreather currently.    Alessandra Grout,

## 2014-03-27 ENCOUNTER — Inpatient Hospital Stay (HOSPITAL_COMMUNITY): Payer: Medicare Other

## 2014-03-27 ENCOUNTER — Encounter: Payer: Medicaid Other | Admitting: Internal Medicine

## 2014-03-27 DIAGNOSIS — D649 Anemia, unspecified: Secondary | ICD-10-CM

## 2014-03-27 LAB — CRYPTOCOCCUS ANTIBODY

## 2014-03-27 MED ORDER — METHYLPREDNISOLONE SODIUM SUCC 125 MG IJ SOLR
60.0000 mg | Freq: Two times a day (BID) | INTRAMUSCULAR | Status: DC
  Administered 2014-03-27 – 2014-03-30 (×5): 60 mg via INTRAVENOUS
  Filled 2014-03-27 (×6): qty 0.96

## 2014-03-27 MED ORDER — FUROSEMIDE 10 MG/ML IJ SOLN
40.0000 mg | Freq: Two times a day (BID) | INTRAMUSCULAR | Status: AC
  Administered 2014-03-27 – 2014-03-29 (×4): 40 mg via INTRAVENOUS
  Filled 2014-03-27 (×4): qty 4

## 2014-03-27 NOTE — Consult Note (Signed)
Name: [name] MRN: [id number] DOB: Jul 19, 1945    ADMISSION DATE:  03/13/2014 CONSULTATION DATE:  03/27/2014  REFERRING MD :  Eppie Gibson ([facility])  CHIEF COMPLAINT:  SOB  BRIEF PATIENT DESCRIPTION: 69 y.o. F brought to ED 2/22 for SOB.  CXR revealed b/l infiltrates.  She was admitted and treated for CAP.  CT chest a few days later revealed progressive ILD.  During early AM hours 2/29, pt desaturated to 35 despite 4L O2 via [place].  She was placed on NRB and PCCM consulted for further recs.  SIGNIFICANT EVENTS  2/22 - admit 2/29 - desaturated to 35, placed on NRB, PCCM consulted 3/7- on lower fio2  STUDIES:  CXR 2/22 >>> diffuse lung opacification superimposed on ILD. 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 GGO's in crazy paving pattern.  Numerous enlarged mediastinal and hilar lymph nodes.   SUBJECTIVE: " i have bird flu", no distress, O needs lower overall  VITAL SIGNS: Temp:  [97.7 F (36.5 C)-98.5 F (36.9 C)] 98.2 F (36.8 C) (03/07 1538) Pulse Rate:  [64-81] 80 (03/07 1549) Resp:  [15-31] 22 (03/07 1549) BP: (100-128)/(52-60) 128/55 mmHg (03/07 1538) SpO2:  [84 %-100 %] 84 % (03/07 1549) FiO2 (%):  [55 %] 55 % (03/07 1347)  PHYSICAL EXAMINATION: General: Morbidly obese female, resting in bed, in NAD. Neuro: A&O x 3, non-focal.  HEENT: [place]/AT. PERRL, sclerae anicteric. Cardiovascular: RRR s1 s2  Lungs: Respirations Coarse, no wheeze Abdomen: Obese, BS x 4, soft, NT/ND.  Musculoskeletal: No gross deformities, 1+ non-pitting edema.  Skin: Intact, warm, no rashes.    Recent Labs Lab 03/21/14 0320 03/24/14 1040 03/26/14 0330  NA 142 140 140  K 4.4 4.0 4.6  CL 102 103 101  CO2 32 31 33*  BUN 17 16 16   CREATININE 0.74 0.59 0.63  GLUCOSE 153* 129* 190*    Recent Labs Lab 03/22/14 0423 03/24/14 1040 03/26/14 0330  HGB 9.2* 9.2* 9.6*  HCT 32.7* 32.3* 33.8*   WBC 18.5* 12.7* 13.4*  PLT 361 354 400   Dg Chest Port 1 View  03/27/2014   CLINICAL DATA:  Respiratory failure.  EXAM: PORTABLE CHEST - 1 VIEW  COMPARISON:  03/24/2014.  03/20/2014.  FINDINGS: Cardiomegaly with diffuse bilateral pulmonary infiltrates are noted. These are unchanged. Stable cardiomegaly. No pleural effusion or pneumothorax. Prior cervical spine fusion.  IMPRESSION: Persistent dense bilateral pulmonary infiltrates, no change from prior exams.   Electronically Signed   By: Marcello Moores  Register   On: 03/27/2014 07:39    ASSESSMENT / PLAN:  Acute on chronic hypoxic respiratory failure Progressive ILD, ? Hypersensitivity pneumonitis - CT worse compared to 2015.  Concern for bird fanciers lung noted Recs: Continue DuoNebs, Albuterol. Doxy course She was on much higher fio2 last week overall, now on 50% or less, can maintain steroids but reduce slight Esr follow up Noted not a candidate in last pccm consult for open lung , and likley to show DAD with steroid exposure with higher risk for OR pcxr slight worse last 24 hr, edema on top?, renal fxn allows neg balance, lasix restarted Will follow up pcxr in am  Will discuss with Dr Chase Caller (ILD expert) any further options treatment medical if becomes stagnent further on O2 needs Is there serology for bird flu? ID consult?  Mediastinal and hilar lymphadenopathy - chronic dating back to 2011. Recs: See above section  ? New HCAP - Doubtful but  will evaluate further given worsening leukocytosis (although she is on steroids), brown tinged sputum, worsening hypoxia and tachypnea. Recs: Repeat PCT unimpressive Repeat sputum culture neg thus far  OSA / probable OHS Recs: Continue nocturnal CPAP.  Pulmonary Hypertension (echo from 2013, PAP 40-44) - Class 3 likely due to chronic lung disease and OSA. Recs: Avoid hypoxia. 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  D/w Resident staff  Lavon Paganini. Titus Mould, MD, [place] Pgr: [facility]

## 2014-03-27 NOTE — Progress Notes (Signed)
Subjective: Pt back on partial non -rebreather from venti mask, as she desatted overnight. Has had no further episodes of confusion. No further pain.  Objective: Vital signs in last 24 hours: Filed Vitals:   03/27/14 0418 03/27/14 0800 03/27/14 0934 03/27/14 1132  BP: 102/60 100/59  124/55  Pulse: 81 71  80  Temp: 97.7 F (36.5 C) 97.8 F (36.6 C)  98.4 F (36.9 C)  TempSrc: Oral Oral  Oral  Resp: 31 18  16   Height:      Weight:      SpO2: 93% 93% 100% 92%   Weight change:   Intake/Output Summary (Last 24 hours) at 03/27/14 1400 Last data filed at 03/27/14 1134  Gross per 24 hour  Intake    640 ml  Output   2025 ml  Net  -1385 ml   Physical Exam: Appearance: Morbidly obese, awake, alert, in NAD on partial nonrebreather HEENT: AT/[place], PERRL, EOMi Heart: RRR, normal S1S2, no MRG Lungs: Still coarse, rhonchrous breathing bilaterally Abdomen: BS+, obese, soft, nontender Extremities: No BLE edema, no tenderness Neurologic: A&Ox3, grossly intact  Lab Results: Basic Metabolic Panel:  Recent Labs Lab 03/24/14 1040 03/26/14 0330  NA 140 140  K 4.0 4.6  CL 103 101  CO2 31 33*  GLUCOSE 129* 190*  BUN 16 16  CREATININE 0.59 0.63  CALCIUM 8.4 8.7   CBC:  Recent Labs Lab 03/21/14 0320  03/24/14 1040 03/26/14 0330  WBC 23.2*  < > 12.7* 13.4*  NEUTROABS 21.7*  --   --   --   HGB 8.9*  < > 9.2* 9.6*  HCT 32.1*  < > 32.3* 33.8*  MCV 80.0  < > 80.8 79.0  PLT 331  < > 354 400  < > = values in this interval not displayed.  Thyroid Function Tests: No results for input(s): TSH, T4TOTAL, FREET4, T3FREE, THYROIDAB in the last 168 hours.  Studies/Results: Dg Chest Port 1 View  03/27/2014   CLINICAL DATA:  Respiratory failure.  EXAM: PORTABLE CHEST - 1 VIEW  COMPARISON:  03/24/2014.  03/20/2014.  FINDINGS: Cardiomegaly with diffuse bilateral pulmonary infiltrates are noted. These are unchanged. Stable cardiomegaly. No pleural effusion or pneumothorax. Prior cervical  spine fusion.  IMPRESSION: Persistent dense bilateral pulmonary infiltrates, no change from prior exams.   Electronically Signed   By: Marcello Moores  Register   On: 03/27/2014 07:39   Medications: I have reviewed the patient's current medications. Scheduled Meds: . antiseptic oral rinse  7 mL Mouth Rinse BID  . benzonatate  100 mg Oral TID  . budesonide  0.25 mg Nebulization 4 times per day  . doxycycline (VIBRAMYCIN) IV  100 mg Intravenous Q12H  . enoxaparin  100 mg Subcutaneous BID  . ferrous sulfate  325 mg Oral TID WC  . furosemide  40 mg Intravenous Q12H  . gabapentin  100 mg Oral TID  . ipratropium-albuterol  3 mL Nebulization Q6H  . lamoTRIgine  200 mg Oral q morning - 10a  . LORazepam  1 mg Oral q morning - 10a  . LORazepam  2 mg Oral QHS  . methylPREDNISolone (SOLU-MEDROL) injection  80 mg Intravenous Q12H  . nicotine  14 mg Transdermal QHS  . pantoprazole  40 mg Oral Daily  . pravastatin  80 mg Oral Daily  . QUEtiapine  400 mg Oral QHS  . sertraline  200 mg Oral q morning - 10a  . sodium chloride  3 mL Intravenous Q12H  . vitamin C  500 mg Oral Daily  . Vitamin D (Ergocalciferol)  50,000 Units Oral Q7 days   Assessment/Plan:  Ms. 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 Prior exposure to birds and macrobid. Initial concern for infection, with initial 3 days of coverage for CAP. WBC 13.4, slight increase form 12.7 on 3/5. Presently on doxycycline and steroid. CXR 3/7 stable dense bilateral pulmonary infiltrates. - Appreciate PCCM recommendations; to see the patient for re-evaluation today - Cont IV steroids- Solumedrol- 80mg  BID - IV Doxycycline- Day 8 - Wean of rebreather back to venti mask,  maintain O2 sats > 85%; expect transient desaturation - PT and OT- recs- SNF -  Continue nebs (duonebs q6 hours, pulmicort q6 hours and albuterol q2 hours PRN) - Robitussin-DM q6 hours PRN - Continue Tessalon Perles TID - Continue tylenol 500 mg PRN q 6 hours - Needs Prevnar vaccine as outpatient - Patient will need high-resolution CT after acute resolution - At this point, considering patient has not significantly improved, a bronchoscopy, lung biopsy might be helpful  Constipation and Hair Loss: TSH normal. Constipation may be secondary to opioids or iron supplementation. - Senokot-S BID PRN - Miralax daily  Chronic Iron Deficiency Anemia: Hemoglobin stable- 9.6. MCV 79.0. - Continue oral iron supplementation TID  Urge and Stress Incontinence: Macrobid has been stopped, as this has an association with pulmonary hypersensitivity. Patient had been on it for her chronic colonization because she was to receive a urodynamic study as an outpatient.  GERD:  - Continue home protonix 40 mg by mouth daily  Depression and Anxiety: Anxiety about health has been present on this admission; improved somewhat. - Continue home Ativan 1 mg by mouth in the am and 2 mg by mouth at night - Continue home Lamictal, Seroquel and Zoloft  Chronic Low Back Pain: Patient has not been using her PRN Norco. - Continue home Norco q6 hours PRN   Tobacco Abuse: Current smoker.  - Cessation counseling - Nicotine patch in place  Vitamin D Deficiency: 15 - Started ergo 50 U weekly  History of DVT and PE:  - Continue lovenox 100 mg BID  Diet: Regular  Dispo: Disposition is deferred at this time, awaiting improvement of current medical problems.     LOS: 14 days   Karlene Einstein, MD 03/27/2014, 2:00 PM

## 2014-03-27 NOTE — Progress Notes (Signed)
Pt refused CPAP for tonight. Pt states she has OSA but has not worn CPAP in about 6 months. Pt states its noisy and she feels like she's being smothered when wearing full face mask. Pt remains on 12L 50% venturi mask SpO2 94%. Pt was encouraged to contact RT if she changes her mind about wearing CPAP. RT will continue to monitor.

## 2014-03-27 NOTE — Progress Notes (Signed)
Physical Therapy Treatment Patient Details Name: [name] MRN: [id number] DOB: Jul 29, 1945 Today's Date: 03/27/2014    History of Present Illness 69 yo female with onset of fever, cough, SOB was admitted with acute respiratory failure and CAP. Transfered to step down due increased WOB 03/20/14 and placed on NRB.    PT Comments    Patient OOB to chair, once again unable to tolerate ambulation activity at this time. Patient highly anxious, this worsened post MD conversation. Patient on venti mask with saturations dropping into the 70s, extensive time spent with patient for breathing cues and monitoring of VS. Max cues to direct to task and focus breathing. Saturations improved to 89% on venti mask with nursing in room and patient position in chair. Continue to recommend ST SNF upon acute discharge as patient still unable to demonstrate ability to ambulate or perform various aspects of self care.    Follow Up Recommendations  SNF;Supervision/Assistance - 24 hour;Other (comment)     Equipment Recommendations  Rolling walker with 5" wheels    Recommendations for Other Services       Precautions / Restrictions Precautions Precautions: Fall Precaution Comments: watch sats Restrictions Weight Bearing Restrictions: No    Mobility  Bed Mobility Overal bed mobility: Needs Assistance Bed Mobility: Supine to Sit Rolling: Min assist   Supine to sit: Mod assist;+2 for physical assistance     General bed mobility comments: Patient with more difficulty coming to EOB this session, possibly due to flat HOB. VCs for hand positioning and sequencing.  Transfers Overall transfer level: Needs assistance Equipment used: None Transfers: Sit to/from [facility] Sit to Stand: Min assist;+2 safety/equipment Stand pivot transfers: Mod assist;+2 physical assistance       General transfer comment: Patient required increased assist to pivot to chair today, very anxious,  patient required Max cues for safety and encouragement for relaxation and breathing focus.   Ambulation/Gait             General Gait Details: too anxious to ambulate safely this session   Stairs            Wheelchair Mobility    Modified Rankin (Stroke Patients Only)       Balance   Sitting-balance support: Feet supported Sitting balance-Leahy Scale: Fair     Standing balance support: Bilateral upper extremity supported Standing balance-Leahy Scale: Poor Standing balance comment: UE support for static standing, patient very anxious could not tolerate for extended time                    Cognition Arousal/Alertness: Awake/alert Behavior During Therapy: Anxious Overall Cognitive Status: Within Functional Limits for tasks assessed                      Exercises      General Comments General comments (skin integrity, edema, etc.): patient highly anxious s/p conversation with MD, patient with a lot of questions, max cues to redirect to task and encourage relaxation and focused breathing. Extensive time monitoring breathing with cues for breathing activities including talk control and supoorted breathing in upright positioning.       Pertinent Vitals/Pain Pain Assessment: No/denies pain    Home Living                      Prior Function            PT Goals (current goals can now be found in the care plan  section) Acute Rehab PT Goals Patient Stated Goal: to go home not to rehab PT Goal Formulation: With patient Time For Goal Achievement: 03/30/14 Potential to Achieve Goals: Good Progress towards PT goals: Progressing toward goals (modestly)    Frequency  Min 3X/week    PT Plan Current plan remains appropriate    Co-evaluation             End of Session Equipment Utilized During Treatment: Oxygen Activity Tolerance: Patient tolerated treatment well Patient left: in chair;with call bell/phone within reach      Time: 1159-1228 PT Time Calculation (min) (ACUTE ONLY): 29 min  Charges:  $Therapeutic Activity: 23-37 mins                    G CodesDuncan Dull 04-22-2014, 4:13 PM Alben Deeds, [place] DPT  [phone]

## 2014-03-28 ENCOUNTER — Inpatient Hospital Stay (HOSPITAL_COMMUNITY): Payer: Medicare Other

## 2014-03-28 DIAGNOSIS — R918 Other nonspecific abnormal finding of lung field: Secondary | ICD-10-CM | POA: Insufficient documentation

## 2014-03-28 DIAGNOSIS — J9690 Respiratory failure, unspecified, unspecified whether with hypoxia or hypercapnia: Secondary | ICD-10-CM | POA: Insufficient documentation

## 2014-03-28 DIAGNOSIS — I272 Other secondary pulmonary hypertension: Secondary | ICD-10-CM

## 2014-03-28 DIAGNOSIS — J672 Bird fancier's lung: Secondary | ICD-10-CM

## 2014-03-28 LAB — BASIC METABOLIC PANEL
ANION GAP: 5 (ref 5–15)
BUN: 18 mg/dL (ref 6–23)
CALCIUM: 8.8 mg/dL (ref 8.4–10.5)
CO2: 39 mmol/L — AB (ref 19–32)
Chloride: 95 mmol/L — ABNORMAL LOW (ref 96–112)
Creatinine, Ser: 0.71 mg/dL (ref 0.50–1.10)
GFR calc Af Amer: >90 mL/min (ref >90)
GFR calc non Af Amer: 86 mL/min — ABNORMAL LOW (ref >90)
Glucose, Bld: 202 mg/dL — ABNORMAL HIGH (ref 70–99)
POTASSIUM: 4.4 mmol/L (ref 3.5–5.1)
Sodium: 139 mmol/L (ref 135–145)

## 2014-03-28 LAB — ANTIPHOSPHOLIPID SYNDROME EVAL, BLD
Anticardiolipin IgA: <9 APL U/mL (ref 0–11)
Anticardiolipin IgG: <9 GPL U/mL (ref 0–14)
DRVVT: 39.6 s (ref 0.0–55.1)
PHOSPHATYDALSERINE, IGG: 3 {GPS'U} (ref 0–11)
PTT Lupus Anticoagulant: 41 s (ref 0.0–50.0)
Phosphatydalserine, IgA: <1 APS IgA (ref 0–20)
Phosphatydalserine, IgM: 85 MPS IgM — ABNORMAL HIGH (ref 0–25)

## 2014-03-28 LAB — PROCALCITONIN: Procalcitonin: <0.1 ng/mL

## 2014-03-28 LAB — APTT: aPTT: 31 seconds (ref 24–37)

## 2014-03-28 LAB — PROTIME-INR
INR: 1.16 (ref 0.00–1.49)
PROTHROMBIN TIME: 14.9 s (ref 11.6–15.2)

## 2014-03-28 LAB — MAGNESIUM: MAGNESIUM: 2.2 mg/dL (ref 1.5–2.5)

## 2014-03-28 LAB — CK TOTAL AND CKMB (NOT AT ARMC)
CK, MB: 0.7 ng/mL (ref 0.3–4.0)
Relative Index: INVALID (ref 0.0–2.5)
Total CK: 35 U/L (ref 7–177)

## 2014-03-28 LAB — SEDIMENTATION RATE: SED RATE: 28 mm/h — AB (ref 0–22)

## 2014-03-28 LAB — PHOSPHORUS: Phosphorus: 4.7 mg/dL — ABNORMAL HIGH (ref 2.3–4.6)

## 2014-03-28 MED ORDER — DOXYCYCLINE HYCLATE 100 MG PO TABS
100.0000 mg | ORAL_TABLET | ORAL | Status: DC
  Administered 2014-03-28 – 2014-04-01 (×8): 100 mg via ORAL
  Filled 2014-03-28 (×11): qty 1

## 2014-03-28 NOTE — Progress Notes (Signed)
Subjective: Ms. Nodal feels "a little better" today. She would like to know what is wrong with her.  Interval Events: - On 50% ventimask, desats to 77% when talking for a long period of time, recovers to 90% when not talking and taking deep breaths  Objective: Vital signs in last 24 hours: Filed Vitals:   03/28/14 0251 03/28/14 0434 03/28/14 0926 03/28/14 1006  BP:  115/47  132/60  Pulse:  66  74  Temp:  97.8 F (36.6 C)  97.9 F (36.6 C)  TempSrc:  Axillary  Axillary  Resp:  20  17  Height:      Weight:      SpO2: 91% 93% 92% 90%   Weight change:   Intake/Output Summary (Last 24 hours) at 03/28/14 1111 Last data filed at 03/28/14 1008  Gross per 24 hour  Intake   1100 ml  Output   3450 ml  Net  -2350 ml   Physical Exam: Appearance: Morbidly obese, awake, alert, in NAD on 50% ventimask HEENT: AT/[place], PERRL, EOMi Heart: RRR, normal S1S2, no MRG Lungs: Still coarse, rhonchrous breathing bilaterally Abdomen: BS+, obese, soft, nontender Extremities: No BLE edema, no tenderness Neurologic: A&Ox3, grossly intact  Lab Results: Basic Metabolic Panel:  Recent Labs Lab 03/26/14 0330 03/28/14 0407  NA 140 139  K 4.6 4.4  CL 101 95*  CO2 33* 39*  GLUCOSE 190* 202*  BUN 16 18  CREATININE 0.63 0.71  CALCIUM 8.7 8.8  MG  --  2.2  PHOS  --  4.7*   CBC:  Recent Labs Lab 03/24/14 1040 03/26/14 0330  WBC 12.7* 13.4*  HGB 9.2* 9.6*  HCT 32.3* 33.8*  MCV 80.8 79.0  PLT 354 400   Studies/Results: Dg Chest Port 1 View  03/28/2014   CLINICAL DATA:  Pneumonia, shortness of breath  EXAM: PORTABLE CHEST - 1 VIEW  COMPARISON:  Portable chest x-ray of 03/26/2016  FINDINGS: There is little change to very minimal improvement in diffuse airspace disease right greater than left most consistent with pneumonia. Superimposed edema cannot be excluded. No definite effusion is seen. Cardiomegaly is stable.  IMPRESSION: Little change to minimal improvement in airspace disease right  greater than left.   Electronically Signed   By: Ivar Drape M.D.   On: 03/28/2014 07:20   Dg Chest Port 1 View  03/27/2014   CLINICAL DATA:  Respiratory failure.  EXAM: PORTABLE CHEST - 1 VIEW  COMPARISON:  03/24/2014.  03/20/2014.  FINDINGS: Cardiomegaly with diffuse bilateral pulmonary infiltrates are noted. These are unchanged. Stable cardiomegaly. No pleural effusion or pneumothorax. Prior cervical spine fusion.  IMPRESSION: Persistent dense bilateral pulmonary infiltrates, no change from prior exams.   Electronically Signed   By: Marcello Moores  Register   On: 03/27/2014 07:39   Medications: I have reviewed the patient's current medications. Scheduled Meds: . antiseptic oral rinse  7 mL Mouth Rinse BID  . benzonatate  100 mg Oral TID  . budesonide  0.25 mg Nebulization 4 times per day  . doxycycline  100 mg Oral 2 times per day  . enoxaparin  100 mg Subcutaneous BID  . ferrous sulfate  325 mg Oral TID WC  . furosemide  40 mg Intravenous Q12H  . gabapentin  100 mg Oral TID  . ipratropium-albuterol  3 mL Nebulization Q6H  . lamoTRIgine  200 mg Oral q morning - 10a  . LORazepam  1 mg Oral q morning - 10a  . LORazepam  2 mg Oral  QHS  . methylPREDNISolone (SOLU-MEDROL) injection  60 mg Intravenous Q12H  . nicotine  14 mg Transdermal QHS  . pantoprazole  40 mg Oral Daily  . pravastatin  80 mg Oral Daily  . QUEtiapine  400 mg Oral QHS  . sertraline  200 mg Oral q morning - 10a  . sodium chloride  3 mL Intravenous Q12H  . vitamin C  500 mg Oral Daily  . Vitamin D (Ergocalciferol)  50,000 Units Oral Q7 days   Assessment/Plan:  Ms. 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 Prior exposure to birds and macrobid. Initial concern for infection, with initial 3 days of coverage for CAP. WBC 13.4,  slight increase form 12.7 on 3/5. Presently on doxycycline and steroid. CXR 03/27/14 stable dense bilateral pulmonary infiltrates. Aldolase, anti-scl antibody, CK, CKMB, cyclic citrul peptide antibody IgG, hypersensitivity pneumonitis, procalcitonin, procalcitonin baseline, sjogren's A and B antibody pending. - Appreciate PCCM recommendations; to see the patient for re-evaluation today - Plan for bronchoscopy with BAL tomorrow at 1:00PM to investigate for cell count diff, eos, DC4/DC8, rule out alveolar proteinosis, increase diagnostics for pneumonitis (which could encourage use of long-term steroids) - Continue IV steroids- Solumedrol- 80mg  BID - Continue IV Doxycycline (started 2/28) - Continue to diurese as tolerated; lasix 40 mg IV q 12 hours - Trend BP, BUN, Cr  - Continue to attempt use of venti mask,  maintain O2 sats > 85%; expect transient desaturation - Continue nebs (duonebs q6 hours, pulmicort q6 hours and albuterol q2 hours PRN) - Robitussin-DM q6 hours PRN - Continue Tessalon Perles TID - Continue tylenol 500 mg PRN q 6 hours - Needs Prevnar vaccine as outpatient - Patient may need high-resolution CT after acute resolution - PT and OT- recs- SNF, patient refusing  Possible New HCAP: White count 13.4. - Repeat sputum culture  Mediastinal and Hilar Lymphadenopathy: Not a candidate for biopsy.  Pulmonary Hypertension: Complicated by OSA, OHS. Patient refuses CPAP. Echo 2013 showed PAP 40-44. Investigating ILD. - Avoid hypoxia  Constipation and Hair Loss: TSH normal. Constipation may be secondary to opioids or iron supplementation. - Senokot-S BID PRN - Miralax daily  Chronic Iron Deficiency Anemia: Hemoglobin stable- 9.6. MCV 79.0. - Continue oral iron supplementation TID  Urge and Stress Incontinence: Macrobid has been stopped, as this has an association with pulmonary hypersensitivity. Patient had been on it for her chronic colonization because she was to receive a  urodynamic study as an outpatient.  GERD:  - Continue home protonix 40 mg by mouth daily  Depression and Anxiety: Anxiety about health has been present on this admission; improved somewhat. - Continue home Ativan 1 mg by mouth in the am and 2 mg by mouth at night - Continue home Lamictal, Seroquel and Zoloft  Chronic Low Back Pain: Patient has not been using her PRN Norco. - Continue home Norco q6 hours PRN   Tobacco Abuse: Current smoker.  - Cessation counseling - Nicotine patch in place  Vitamin D Deficiency: 15 - Started ergo 50 U weekly  History of DVT and PE:  - Continue chronic lovenox 100 mg BID  Diet: Regular then NPO at midnight for tomorrow's bronchoscopy  Dispo: Disposition is deferred at this time, awaiting improvement of current medical problems.     LOS: 15 days   Alveta Heimlich  Jeovanny Cuadros, MD 03/28/2014, 11:11 AM

## 2014-03-28 NOTE — Progress Notes (Signed)
Physical Therapy Treatment Patient Details Name: [name] MRN: [id number] DOB: 01/23/1945 Today's Date: 03/28/2014    History of Present Illness 69 yo female with onset of fever, cough, SOB was admitted with acute respiratory failure and CAP. Transfered to step down due increased WOB 03/20/14 and placed on NRB.    PT Comments    Patient performed OOB to chair mobility with max encouragement and increased reluctance. Patient required significant time to carry out task but was able to do so without physical assist and saturations on NRB remained mid 80s.  Patient very confabulatory in the presence of this therapist. Patient also states that a doctor came in this morning and told her that she shouldn't get up in the chair, this again contradicts specifics orders for "OOB to chair 2x a day." Patient also stating that she has not walked in 4 years, when inquired further about equipment needs she states she has a wheel chair and a power chair and that it is not her goal to walk. Goals downgraded for ambulation, and she has met other goals established, at this time will decrease frequency to 1x/wk.     Follow Up Recommendations  SNF;Supervision/Assistance - 24 hour;Other (comment)     Equipment Recommendations  None recommended by PT (patient states she has wheel chair and power chair)    Recommendations for Other Services       Precautions / Restrictions Precautions Precautions: Fall Precaution Comments: watch sats Restrictions Weight Bearing Restrictions: No    Mobility  Bed Mobility Overal bed mobility: Needs Assistance Bed Mobility: Supine to Sit Rolling: Min guard   Supine to sit: Min guard     General bed mobility comments: Significantly increased time to perform, occasional cues for positioning, patient very self directed with max cues to direct to task  Transfers Overall transfer level: Needs assistance Equipment used: None Transfers: Sit to/from [employer] Sit to Stand: Min guard (did not reach upright position) Stand pivot transfers: Min guard       General transfer comment: patient positioned herself face to face with chair rails, came to semi stand position, alternated hand placement to pivot around to sitting. Min guard for stability, no physical assist provided.  Patient requesting to 'give her space'. Patient positioned in chair with cues for scooting, able to perform without assist. Saturations remained mid to high 80s with activity on NRB.   Ambulation/Gait             General Gait Details: patient refusing ambulation, states she hasn't walked in four years, prompted to readdress goals of therapy at this time.   Stairs            Wheelchair Mobility    Modified Rankin (Stroke Patients Only)       Balance Overall balance assessment: Needs assistance Sitting-balance support: Feet supported Sitting balance-Leahy Scale: Fair                              Cognition Arousal/Alertness: Awake/alert Behavior During Therapy: Anxious Overall Cognitive Status: Within Functional Limits for tasks assessed                      Exercises      General Comments        Pertinent Vitals/Pain Pain Assessment: No/denies pain    Home Living  Prior Function            PT Goals (current goals can now be found in the care plan section) Acute Rehab PT Goals Patient Stated Goal: to go home not to rehab PT Goal Formulation: With patient Time For Goal Achievement: 03/30/14 Potential to Achieve Goals: Good Progress towards PT goals: Goals met and updated - see care plan (downgraded ambulation per request, pt states does not walk)    Frequency  Min 1X/week    PT Plan Current plan remains appropriate;Frequency needs to be updated    Co-evaluation PT/OT/SLP Co-Evaluation/Treatment: Yes Reason for Co-Treatment: Complexity of the patient's impairments  (multi-system involvement) PT goals addressed during session: Mobility/safety with mobility OT goals addressed during session: ADL's and self-care     End of Session Equipment Utilized During Treatment: Oxygen (NRB) Activity Tolerance: Patient tolerated treatment well Patient left: in chair;with call bell/phone within reach     Time: 2202-6691 PT Time Calculation (min) (ACUTE ONLY): 34 min  Charges:  $Therapeutic Activity: 8-22 mins                    G CodesDuncan [name] Apr 21, 2014, 9:35 AM Alben Deeds, PT DPT  [phone]

## 2014-03-28 NOTE — Progress Notes (Signed)
Pt refused CPAP for tonight and prefers to wear 55% Venturi mask (SpO2 92%) instead. Pt was informed should her oxygen drop throughout the night, CPAP would be administered. RT will continue to monitor.

## 2014-03-28 NOTE — Progress Notes (Signed)
Occupational Therapy Treatment Patient Details Name: [name] MRN: [id number] DOB: 1945/01/24 Today's Date: 03/28/2014    History of present illness 69 yo female with onset of fever, cough, SOB was admitted with acute respiratory failure and CAP. Transfered to step down due increased WOB 03/20/14 and placed on NRB.   OT comments  Pt with poor insight into requirements of mobility for safe discharge home. All OOB activity requires extensive coaxing. Performed UB bathing and dressing and grooming once she transferred to chair.  Requires min guard assist and management of lines for transfer. Will continue to follow.  Follow Up Recommendations  SNF (pt is declining SNF)    Equipment Recommendations  None recommended by OT    Recommendations for Other Services      Precautions / Restrictions Precautions Precautions: Fall Precaution Comments: watch sats Restrictions Weight Bearing Restrictions: No       Mobility Bed Mobility Overal bed mobility: Needs Assistance Bed Mobility: Supine to Sit Rolling: Min guard   Supine to sit: Min guard     General bed mobility comments: Significantly increased time to perform, occasional cues for positioning, patient very self directed with max cues to direct to task  Transfers Overall transfer level: Needs assistance Equipment used: None Transfers: Sit to/from [employer] Sit to Stand: Min guard (did not reach upright position) Stand pivot transfers: Min guard       General transfer comment: patient positioned herself face to face with chair rails, came to semi stand position, alternated hand placement to pivot around to sitting. Min guard for stability, no physical assist provided.  Patient requesting to 'give her space'. Patient positioned in chair with cues for scooting, able to perform without assist. Saturations remained mid to high 80s with activity on NRB.     Balance   Sitting-balance support: Feet  supported Sitting balance-Leahy Scale: Fair                             ADL Overall ADL's : Needs assistance/impaired     Grooming: Sitting;Brushing hair;Set up   Upper Body Bathing: Minimal assitance;Sitting       Upper Body Dressing : Minimal assistance Upper Body Dressing Details (indicate cue type and reason): assist for lines                          Vision                     Perception     Praxis      Cognition   Behavior During Therapy: Anxious Overall Cognitive Status: Within Functional Limits for tasks assessed                       Extremity/Trunk Assessment               Exercises     Shoulder Instructions       General Comments      Pertinent Vitals/ Pain       Pain Assessment: No/denies pain  Home Living                                          Prior Functioning/Environment              Frequency Min  2X/week     Progress Toward Goals  OT Goals(current goals can now be found in the care plan section)  Progress towards OT goals: Progressing toward goals  Acute Rehab OT Goals Patient Stated Goal: to go home not to rehab Time For Goal Achievement: 03/30/14  Plan Discharge plan remains appropriate    Co-evaluation    PT/OT/SLP Co-Evaluation/Treatment: Yes Reason for Co-Treatment: Complexity of the patient's impairments (multi-system involvement) PT goals addressed during session: Mobility/safety with mobility OT goals addressed during session: ADL's and self-care      End of Session Equipment Utilized During Treatment: Oxygen (100% NRB)   Activity Tolerance Patient tolerated treatment well   Patient Left in chair;with call bell/phone within reach   Nurse Communication          Time: 3888-2800 OT Time Calculation (min): 42 min  Charges: OT General Charges $OT Visit: 1 Procedure OT Treatments $Self Care/Home Management : 23-37 mins  Malka So 03/28/2014, 9:34 AM  [phone]

## 2014-03-28 NOTE — Progress Notes (Signed)
Remote eval of story and cT and labs  She has GGO bialteral scattered with som cysts like lesion in UL since 2008/2009 No prior hx of bronch or lung bx as best  I can determine Now worse Autoimmune -t race positive RF, Centrometer and ANA PCT low positive only  Currently worse. 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 SARCOID (ACE level is 95)  P In ideal world needs surgical lung bx but appears to be risk due to obesity, and high PAP IF possible recommend atleast BAL from lingula / RML  - CD4:CD8 ratio flow cytometry - to be sent to Dr Susanne Greenhouse at [facility] - instructions need to be specific with call ahead  - Cell count and differential  - Micro, Cyrtology, gram stain, PCP, legionella - standard stuff  Also, have ordred repeat PCT, CCP antibody and HP panel, aldolase, CK, ssA, ssB, scl-70 Continue steroids for now''   D/w feinstein  Dr. Brand Males, M.D., [facility].C.P Pulmonary and Critical Care Medicine Staff Physician [facility] Pager: [phone], If no answer or between  15:00h - 7:00h: call [phone]  03/28/2014 3:48 PM

## 2014-03-28 NOTE — Progress Notes (Signed)
Name: [name] MRN: [id number] DOB: [birth year]    ADMISSION DATE:  03/13/2014 CONSULTATION DATE:  03/28/2014  REFERRING MD :  Eppie Gibson ([facility])  CHIEF COMPLAINT:  SOB  BRIEF PATIENT DESCRIPTION:  69 y.o. F brought to ED 2/22 for SOB.  CXR revealed b/l infiltrates.  She was admitted and treated for CAP.  CT chest a few days later revealed progressive ILD.  During early AM hours 2/29, pt desaturated to 35 despite 4L O2 via [place].  She was placed on NRB and PCCM consulted for further recs.  SIGNIFICANT EVENTS  2/22 - admit 2/29 - desaturated to 35, placed on NRB, PCCM consulted 3/7- on lower fio2  STUDIES:  CXR 2/22 >>> diffuse lung opacification superimposed on ILD. 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 GGO's in crazy paving pattern.  Numerous enlarged mediastinal and hilar lymph nodes.  SUBJECTIVE: no distress  VITAL SIGNS: Temp:  [97.8 F (36.6 C)-98.7 F (37.1 C)] 98.1 F (36.7 C) (03/08 1259) Pulse Rate:  [66-80] 74 (03/08 1259) Resp:  [17-22] 18 (03/08 1259) BP: (113-132)/(47-97) 132/53 mmHg (03/08 1259) SpO2:  [80 %-95 %] 93 % (03/08 1259) FiO2 (%):  [50 %-55 %] 55 % (03/08 1136)  Intake/Output Summary (Last 24 hours) at 03/28/14 1352 Last data filed at 03/28/14 1300  Gross per 24 hour  Intake   1100 ml  Output   4050 ml  Net  -2950 ml   PHYSICAL EXAMINATION: General: Morbidly obese female, resting in bed, in NAD. Neuro: A&O x 3, non-focal.  HEENT: [place]/AT. PERRL, sclerae anicteric. Cardiovascular: RRR s1 s2  Lungs: Respirations Coarse, crackles posteriorly  Abdomen: Obese, BS x 4, soft, NT/ND.  Musculoskeletal: No gross deformities, 1+ non-pitting edema.  Skin: Intact, warm, no rashes.   Recent Labs Lab 03/24/14 1040 03/26/14 0330 03/28/14 0407  NA 140 140 139  K 4.0 4.6 4.4  CL 103 101 95*  CO2 31 33* 39*  BUN 16 16 18   CREATININE 0.59 0.63 0.71   GLUCOSE 129* 190* 202*    Recent Labs Lab 03/22/14 0423 03/24/14 1040 03/26/14 0330  HGB 9.2* 9.2* 9.6*  HCT 32.7* 32.3* 33.8*  WBC 18.5* 12.7* 13.4*  PLT 361 354 400   Dg Chest Port 1 View  03/28/2014   CLINICAL DATA:  Pneumonia, shortness of breath  EXAM: PORTABLE CHEST - 1 VIEW  COMPARISON:  Portable chest x-ray of 03/26/2016  FINDINGS: There is little change to very minimal improvement in diffuse airspace disease right greater than left most consistent with pneumonia. Superimposed edema cannot be excluded. No definite effusion is seen. Cardiomegaly is stable.  IMPRESSION: Little change to minimal improvement in airspace disease right greater than left.   Electronically Signed   By: Ivar Drape M.D.   On: 03/28/2014 07:20   Dg Chest Port 1 View  03/27/2014   CLINICAL DATA:  Respiratory failure.  EXAM: PORTABLE CHEST - 1 VIEW  COMPARISON:  03/24/2014.  03/20/2014.  FINDINGS: Cardiomegaly with diffuse bilateral pulmonary infiltrates are noted. These are unchanged. Stable cardiomegaly. No pleural effusion or pneumothorax. Prior cervical spine fusion.  IMPRESSION: Persistent dense bilateral pulmonary infiltrates, no change from prior exams.   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  >she has not improved much so wonder at this point how much of this is disease progression > Noted not a candidate in last pccm consult for open lung , and likley to show DAD with steroid exposure with higher risk for OR Recs: Continue DuoNebs, Albuterol Complete empiric abx Push diuresis as BP,BUN and creatinine allow.  Will try oximizer to improve O2 delivery and comfort.  Early involvement of palliative care would be appropriate in this setting assuming this is progression  of the ILD  Will schedule for bronch bal for am (if O2 needs allow) for cell count diff, eos, cd4/cd8, ensure alveolar proteinosis? Increase diagnostics for pneumonitis If suggestive would stick with steroids longer No BX, poor candidate at this stage Npo midnight  Mediastinal and hilar lymphadenopathy - chronic dating back to 2011. Recs: See above section Not candidate for bronchoscopy - bx   ? New HCAP - Doubtful but will evaluate further given worsening leukocytosis (although she is on steroids), brown tinged sputum, worsening hypoxia and tachypnea. Recs: Repeat sputum culture neg thus far  OSA / probable OHS Recs: Continue nocturnal CPAP.  Pulmonary Hypertension (echo from 2013, PAP 40-44) - Class 3 likely due to chronic lung disease and OSA. Recs: Avoid hypoxia. Treat underlying ILD  Known DVT / PE (2011) - on lifelong lovenox Recs: Continue therapeutic dose Lovenox.  Erick Colace ACNP-BC [facility] [place] Pager # [phone] OR # [phone] if no answer   STAFF NOTE: I, Merrie Roof, MD FACP have personally reviewed patient's available data, including medical history, events of note, physical examination and test results as part of my evaluation. I have discussed with resident/NP and other care providers such as pharmacist, RN and RRT. In addition, I personally evaluated patient and elicited key findings of: will discuss bronch BAL for 1 pm weed in lab, if O2 needs allow, have had some improvements, maintain steroids, complete abx course, will also send culture from bronch, prognosis concerning Lasix  D/w resident  Lavon Paganini. Titus Mould, MD, [place] Pgr: [facility] 03/28/2014 3:34 PM  '

## 2014-03-29 ENCOUNTER — Encounter (HOSPITAL_COMMUNITY)
Admission: EM | Disposition: A | Discharge status: Skilled Nursing Facility | Payer: Self-pay | Source: Home / Self Care | Attending: Internal Medicine

## 2014-03-29 ENCOUNTER — Encounter (HOSPITAL_COMMUNITY): Payer: Medicare Other

## 2014-03-29 ENCOUNTER — Inpatient Hospital Stay (HOSPITAL_COMMUNITY): Payer: Medicare Other

## 2014-03-29 DIAGNOSIS — G4733 Obstructive sleep apnea (adult) (pediatric): Secondary | ICD-10-CM

## 2014-03-29 DIAGNOSIS — J8489 Other specified interstitial pulmonary diseases: Secondary | ICD-10-CM

## 2014-03-29 DIAGNOSIS — Z9981 Dependence on supplemental oxygen: Secondary | ICD-10-CM

## 2014-03-29 LAB — BASIC METABOLIC PANEL
ANION GAP: 7 (ref 5–15)
BUN: 21 mg/dL (ref 6–23)
CALCIUM: 8.4 mg/dL (ref 8.4–10.5)
CO2: 36 mmol/L — ABNORMAL HIGH (ref 19–32)
Chloride: 95 mmol/L — ABNORMAL LOW (ref 96–112)
Creatinine, Ser: 0.74 mg/dL (ref 0.50–1.10)
GFR calc Af Amer: >90 mL/min (ref >90)
GFR calc non Af Amer: 85 mL/min — ABNORMAL LOW (ref >90)
GLUCOSE: 188 mg/dL — AB (ref 70–99)
Potassium: 4 mmol/L (ref 3.5–5.1)
Sodium: 138 mmol/L (ref 135–145)

## 2014-03-29 LAB — PROCALCITONIN: Procalcitonin: <0.1 ng/mL

## 2014-03-29 LAB — ANTI-SCLERODERMA ANTIBODY: Scleroderma (Scl-70) (ENA) Antibody, IgG: <1

## 2014-03-29 SURGERY — VIDEO BRONCHOSCOPY WITHOUT FLUORO
Anesthesia: Moderate Sedation | Laterality: Bilateral

## 2014-03-29 MED ORDER — FUROSEMIDE 10 MG/ML IJ SOLN
40.0000 mg | Freq: Every day | INTRAMUSCULAR | Status: DC
  Administered 2014-03-29: 40 mg via INTRAVENOUS
  Filled 2014-03-29 (×2): qty 4

## 2014-03-29 NOTE — Progress Notes (Signed)
Pt refused to change to High Flow Oxygen. She is currently on 6L Oxymizer, Sats 91%. I explained to the pt if her O2 demand increased we would have to change her. She did not seem to accept this. RT will monitor.

## 2014-03-29 NOTE — Progress Notes (Signed)
Name: [name] MRN: [id number] DOB: 01/02/1946    ADMISSION DATE:  03/13/2014 CONSULTATION DATE:  03/29/2014  REFERRING MD :  Eppie Gibson ([facility])  CHIEF COMPLAINT:  SOB  BRIEF PATIENT DESCRIPTION:  69 y.o. F brought to ED 2/22 for SOB.  CXR revealed b/l infiltrates.  She was admitted and treated for CAP.  CT chest a few days later revealed progressive ILD.  During early AM hours 2/29, pt desaturated to 35 despite 4L O2 via [place].  She was placed on NRB and PCCM consulted for further recs.  SIGNIFICANT EVENTS  2/22 - admit 2/29 - desaturated to 35, placed on NRB, PCCM consulted 3/7- on lower fio2  STUDIES:  CXR 2/22 >>> diffuse lung opacification superimposed on ILD. 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 GGO's in crazy paving pattern.  Numerous enlarged mediastinal and hilar lymph nodes.  SUBJECTIVE:  Desaturation today on 50% or higher  VITAL SIGNS: Temp:  [97.8 F (36.6 C)-98.1 F (36.7 C)] 97.8 F (36.6 C) (03/09 1159) Pulse Rate:  [66-84] 69 (03/09 1159) Resp:  [18-27] 18 (03/09 1159) BP: (117-151)/(43-118) 123/109 mmHg (03/09 1159) SpO2:  [88 %-96 %] 92 % (03/09 0854) FiO2 (%):  [55 %] 55 % (03/09 0852)  Intake/Output Summary (Last 24 hours) at 03/29/14 1241 Last data filed at 03/29/14 1154  Gross per 24 hour  Intake    466 ml  Output   3750 ml  Net  -3284 ml   PHYSICAL EXAMINATION: General: Morbidly obese female, some increase rr slight Neuro: A&O x 3, non-focal anxious at times HEENT: [place]/AT. PERRL, sclerae anicteric Cardiovascular: RRR s1 s2  Lungs: unchanged BS coarse Abdomen: Obese, BS x 4, soft, NT/ND.  Musculoskeletal: No gross deformities, 1+ non-pitting edema remains Skin: Intact, warm, no rashes.   Recent Labs Lab 03/26/14 0330 03/28/14 0407 03/29/14 0325  NA 140 139 138  K 4.6 4.4 4.0  CL 101 95* 95*  CO2 33* 39* 36*  BUN 16 18 21    CREATININE 0.63 0.71 0.74  GLUCOSE 190* 202* 188*    Recent Labs Lab 03/24/14 1040 03/26/14 0330  HGB 9.2* 9.6*  HCT 32.3* 33.8*  WBC 12.7* 13.4*  PLT 354 400   Dg Chest Port 1 View  03/28/2014   CLINICAL DATA:  Pneumonia, shortness of breath  EXAM: PORTABLE CHEST - 1 VIEW  COMPARISON:  Portable chest x-ray of 03/26/2016  FINDINGS: There is little change to very minimal improvement in diffuse airspace disease right greater than left most consistent with pneumonia. Superimposed edema cannot be excluded. No definite effusion is seen. Cardiomegaly is stable.  IMPRESSION: Little change to minimal improvement in airspace disease right greater than left.   Electronically Signed   By: Ivar Drape M.D.   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  Mediastinal and hilar lymphadenopathy - chronic dating back to 2011. >may have some response to roids  > Noted not a candidate in last pccm consult for open lung , and likley to show DAD with steroid exposure with higher risk for OR Recs: Continue DuoNebs, Albuterol  Will re order high flow O2 nasal cannula reservoir  Early involvement of palliative care per primary - please consider No BX, poor candidate at this stage  Cancel bronch as unsafe with current O2 needs and desaturation noted With such a robust response and tolerating well lasix, wold continue this with hopes to have safe bronch in next 48 hrs keep steroids, likely reduce in am , unclear if she is a responder as of now Sed rate has reduced on steroids 68 to 28 Will follow repeat labs sent by Dr Chase Caller, recs noted and appreciated Will feed Repeat pcxr now and in am   ? New HCAP - Doubtful but will evaluate further given worsening leukocytosis (although she is on steroids), brown tinged sputum,  worsening hypoxia and tachypnea. Recs: Repeat sputum culture neg thus far  OSA / probable OHS Recs: Continue nocturnal CPAP  Pulmonary Hypertension (echo from 2013, PAP 40-44) - Class 3 likely due to chronic lung disease and OSA. 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 Titus Mould, MD, [place] Pgr: [facility] 03/29/2014 12:41 PM  '

## 2014-03-29 NOTE — Progress Notes (Signed)
Subjective: [name] feels "nervous" for her bronchoscopy. She remembers feeling anxious each time she awoke overnight, but was able to get back to sleep. Her son, [name], is coming to visit her today.   Interval Events: - On 50% ventimask overnight - Bronchoscopy cancelled for today due to patient hypoxia, per PCCM  Objective: Vital signs in last 24 hours: Filed Vitals:   03/28/14 2019 03/29/14 0000 03/29/14 0224 03/29/14 0449  BP:    117/43  Pulse:    73  Temp:  98 F (36.7 C)  98.1 F (36.7 C)  TempSrc:  Axillary  Axillary  Resp:    19  Height:      Weight:      SpO2: 91%  92% 88%   Weight change:   Intake/Output Summary (Last 24 hours) at 03/29/14 0723 Last data filed at 03/29/14 0400  Gross per 24 hour  Intake   1073 ml  Output   3650 ml  Net  -2577 ml   Physical Exam: Appearance: Morbidly obese, awake, alert, in NAD on 50% ventimask, satting 90-92% even while talking HEENT: AT/, PERRL, EOMi Heart: RRR, normal S1S2, no MRG Lungs: coarse, rhonchrous breathing bilaterally, somewhat reduced from yesterday's exam Abdomen: BS+, obese, soft, nontender Extremities: No BLE edema, no tenderness Neurologic: A&Ox3, grossly intact  Lab Results: Basic Metabolic Panel:  Recent Labs Lab 03/28/14 0407 03/29/14 0325  NA 139 138  K 4.4 4.0  CL 95* 95*  CO2 39* 36*  GLUCOSE 202* 188*  BUN 18 21  CREATININE 0.71 0.74  CALCIUM 8.8 8.4  MG 2.2  --   PHOS 4.7*  --    CBC:  Recent Labs Lab 03/24/14 1040 03/26/14 0330  WBC 12.7* 13.4*  HGB 9.2* 9.6*  HCT 32.3* 33.8*  MCV 80.8 79.0  PLT 354 400   Studies/Results: Dg Chest Port 1 View  03/28/2014   CLINICAL DATA:  Pneumonia, shortness of breath  EXAM: PORTABLE CHEST - 1 VIEW  COMPARISON:  Portable chest x-ray of 03/26/2016  FINDINGS: There is little change to very minimal improvement in diffuse airspace disease right greater than left most consistent with pneumonia. Superimposed edema cannot be excluded. No  definite effusion is seen. Cardiomegaly is stable.  IMPRESSION: Little change to minimal improvement in airspace disease right greater than left.   Electronically Signed   By: Ivar Drape M.D.   On: 03/28/2014 07:20   Medications: I have reviewed the patient's current medications. Scheduled Meds: . antiseptic oral rinse  7 mL Mouth Rinse BID  . benzonatate  100 mg Oral TID  . budesonide  0.25 mg Nebulization 4 times per day  . doxycycline  100 mg Oral 2 times per day  . enoxaparin  100 mg Subcutaneous BID  . ferrous sulfate  325 mg Oral TID WC  . gabapentin  100 mg Oral TID  . ipratropium-albuterol  3 mL Nebulization Q6H  . lamoTRIgine  200 mg Oral q morning - 10a  . LORazepam  1 mg Oral q morning - 10a  . LORazepam  2 mg Oral QHS  . methylPREDNISolone (SOLU-MEDROL) injection  60 mg Intravenous Q12H  . nicotine  14 mg Transdermal QHS  . pantoprazole  40 mg Oral Daily  . pravastatin  80 mg Oral Daily  . QUEtiapine  400 mg Oral QHS  . sertraline  200 mg Oral q morning - 10a  . sodium chloride  3 mL Intravenous Q12H  . vitamin C  500 mg Oral  Daily  . 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 She is going for bronchoscopy with bronchoalveolar lavage today.  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 Differential remains broad (Broncheolitis obliterans organizing pneumonia, pulmonary alveolar proteinosis, hypersensitivity pneumonitis, eosinophilic granuloma, desquamative interstitial pneumonia, acute interstitial pneumonia, acute eosinophilic pneumonia, sarcoid). Prior exposure to birds and macrobid. Initial concern for infection, with 3 days of coverage for CAP. WBC 13.4. Presently on doxycycline and steroid. CXR 03/28/14 little change in airspace  disease R>L. Aldolase, anti-scl antibody, CK, CKMB, cyclic citrul peptide antibody IgG, hypersensitivity pneumonitis, sjogren's A and B antibody pending. Crypto antigen negative, antiphospholipid syndrome negative except for phosphatydalserine IgM. Procalcitonin 1.46 with baseline 0.39. RF elevated (17.3). ESR elevated (68). ANCA WNL. Mpo/pr-3 WNL. TSH WNL. Creatinine .0.74.  - Appreciate PCCM recommendations - Plan was for bronchoscopy with BAL,but cancelled due to patient's hypoxia - Goal of bronchoscopy will be for BAL from lingula / RML. Attempt to investigate for cell count and differential, eos, micro, cytology, gram stain, PCP, legionella, CD4:CD8 ratio flow cytometry (to be sent to Dr. Susanne Greenhouse at [place]), rule out alveolar proteinosis, increase diagnostics for pneumonitis (which could encourage use of long-term steroids) - Repeat procalcitonin, CCP antibody, hypersensitivity panel, aldolase, CK, ssA, ssB, scl-70 pending - Call ahead to Dr. Susanne Greenhouse ahead with instructions - Continue IV steroids- Solumedrol- 80mg  BID - Continue IV Doxycycline (started 2/28) - Continue to diurese as tolerated; lasix 40 mg IV q 12 hours - Trend BP, BUN, Cr  - Continue to attempt use of venti mask,  maintain O2 sats > 85%; expect transient desaturation - Continue nebs (duonebs q6 hours, pulmicort q6 hours and albuterol q2 hours PRN) - Robitussin-DM q6 hours PRN - Continue Tessalon Perles TID - Continue tylenol 500 mg PRN q 6 hours - Needs Prevnar vaccine as outpatient - PT and OT- recs- SNF, patient refusing  Possible New HCAP: White count 13.4. - Repeat sputum culture  Mediastinal and Hilar Lymphadenopathy: ACE 95 in 2009-->50 in 02/2014. Not a candidate for biopsy.  Pulmonary Hypertension: Complicated by OSA, OHS. Patient refuses CPAP. Echo 2013 showed PAP 40-44. Investigating ILD. - Avoid hypoxia  Constipation and Hair Loss: TSH normal. Constipation may be secondary to opioids or iron  supplementation. - Senokot-S BID PRN - Miralax daily  Chronic Iron Deficiency Anemia: Hemoglobin stable- 9.6. MCV 79.0. - Continue oral iron supplementation TID  Urge and Stress Incontinence: Macrobid has been stopped, as this has an association with pulmonary hypersensitivity. Patient had been on it for her chronic colonization because she was to receive a urodynamic study as an outpatient.  GERD:  - Continue home protonix 40 mg by mouth daily  Depression and Anxiety: Anxiety about health has been present on this admission; improved somewhat. - Continue home Ativan 1 mg by mouth in the am and 2 mg by mouth at night - Continue home Lamictal, Seroquel and Zoloft  Chronic Low Back Pain: Patient has not been using her PRN Norco. - Continue home Norco q6 hours PRN   Tobacco Abuse: Current smoker.  - Cessation counseling - Nicotine patch in place  Vitamin D Deficiency: 15 - Started ergo 50 U weekly  History of DVT and PE:  - Continue chronic lovenox 100 mg BID  Diet: Regular then  NPO at midnight for bronchoscopy, then regular  Dispo: Disposition is deferred at this time, awaiting improvement of current medical problems.     LOS: 16 days   Karlene Einstein, MD 03/29/2014, 7:23 AM

## 2014-03-29 NOTE — Progress Notes (Signed)
Pt is refusing to wear CPAP tonight. States that she would rather wear 6L [place]. RT will monitor.

## 2014-03-29 NOTE — Progress Notes (Addendum)
Pt is resting and has been NPO since Midnight for a possible bronchoscopy today. No order seen for procedure. Pt is alert and oriented x 3. Pt states she is aware she is going for procedure. Holding all po medications and pt is NPO.

## 2014-03-30 ENCOUNTER — Inpatient Hospital Stay (HOSPITAL_COMMUNITY): Payer: Medicare Other

## 2014-03-30 DIAGNOSIS — R0600 Dyspnea, unspecified: Secondary | ICD-10-CM | POA: Insufficient documentation

## 2014-03-30 DIAGNOSIS — F29 Unspecified psychosis not due to a substance or known physiological condition: Secondary | ICD-10-CM

## 2014-03-30 DIAGNOSIS — J849 Interstitial pulmonary disease, unspecified: Secondary | ICD-10-CM | POA: Insufficient documentation

## 2014-03-30 LAB — CBC
HEMATOCRIT: 34.2 % — AB (ref 36.0–46.0)
Hemoglobin: 9.9 g/dL — ABNORMAL LOW (ref 12.0–15.0)
MCH: 22.8 pg — ABNORMAL LOW (ref 26.0–34.0)
MCHC: 28.9 g/dL — ABNORMAL LOW (ref 30.0–36.0)
MCV: 78.8 fL (ref 78.0–100.0)
Platelets: 314 10*3/uL (ref 150–400)
RBC: 4.34 MIL/uL (ref 3.87–5.11)
RDW: 19.9 % — ABNORMAL HIGH (ref 11.5–15.5)
WBC: 11.7 10*3/uL — ABNORMAL HIGH (ref 4.0–10.5)

## 2014-03-30 LAB — PROCALCITONIN: Procalcitonin: <0.1 ng/mL

## 2014-03-30 LAB — SJOGRENS SYNDROME-A EXTRACTABLE NUCLEAR ANTIBODY: SSA (Ro) (ENA) Antibody, IgG: <1

## 2014-03-30 LAB — BASIC METABOLIC PANEL
Anion gap: 6 (ref 5–15)
BUN: 22 mg/dL (ref 6–23)
CHLORIDE: 99 mmol/L (ref 96–112)
CO2: 33 mmol/L — ABNORMAL HIGH (ref 19–32)
Calcium: 8.4 mg/dL (ref 8.4–10.5)
Creatinine, Ser: 0.64 mg/dL (ref 0.50–1.10)
GFR calc Af Amer: >90 mL/min (ref >90)
GFR calc non Af Amer: 89 mL/min — ABNORMAL LOW (ref >90)
Glucose, Bld: 128 mg/dL — ABNORMAL HIGH (ref 70–99)
Potassium: 3.5 mmol/L (ref 3.5–5.1)
Sodium: 138 mmol/L (ref 135–145)

## 2014-03-30 LAB — SJOGRENS SYNDROME-B EXTRACTABLE NUCLEAR ANTIBODY: SSB (La) (ENA) Antibody, IgG: <1

## 2014-03-30 LAB — PHOSPHORUS: PHOSPHORUS: 3.5 mg/dL (ref 2.3–4.6)

## 2014-03-30 LAB — MAGNESIUM: MAGNESIUM: 2.3 mg/dL (ref 1.5–2.5)

## 2014-03-30 MED ORDER — METHYLPREDNISOLONE SODIUM SUCC 40 MG IJ SOLR
40.0000 mg | Freq: Two times a day (BID) | INTRAMUSCULAR | Status: DC
  Administered 2014-03-30 – 2014-03-31 (×2): 40 mg via INTRAVENOUS
  Filled 2014-03-30 (×4): qty 1

## 2014-03-30 MED ORDER — FUROSEMIDE 10 MG/ML IJ SOLN
40.0000 mg | Freq: Two times a day (BID) | INTRAMUSCULAR | Status: DC
  Administered 2014-03-30 – 2014-04-04 (×12): 40 mg via INTRAVENOUS
  Filled 2014-03-30 (×12): qty 4

## 2014-03-30 NOTE — Progress Notes (Signed)
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   Morbid obesity with BMI of 50.0-59.9, adult   Gastric antral vascular ectasia   Hyperlipidemia LDL goal <130   Anxiety   Gastroesophageal reflux disease   Osteoporosis   CAP (community acquired pneumonia)   Anemia, iron deficiency   Pneumonitis, hypersensitivity, avian   Pneumonitis   Vitamin D deficiency   Absolute anemia   Pulmonary infiltrates   Respiratory failure   Length of Stay (days):17   SUBJECTIVE/24 HOUR EVENTS: 69 y.o. 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 Patient's O2 sat is doing better than yesterday on 6L nasal canula (Oxymizer) (O2 sats ranging from lower 90s at rest to mid 80s while speaking). Patient was in good spirits on our first visit earlier this morning and had noticed improvements in her memory; however, after pulmonary f/u for possible rescheduling of bronchoalveolar lavage she was very upset/emotional and confused about DNR status and the possbility and reasons for a ventilator if she were to experience lung decline during the procedure. Pulmonary canceled bronch for today citing no improvement/worsening of pcxr. Palliative care was consulted for goals of care discussion with the patient and her son per her request for him to be present. Repeat sputum culture and labs still pending. No changes otherwise.   OBJECTIVE: Filed Vitals:   03/30/14 0414 03/30/14 0848 03/30/14 0852 03/30/14 0909  BP:    125/46  Pulse:    73  Temp: 97.7 F (36.5 C)   98.1 F (36.7 C)  TempSrc: Axillary   Oral  Resp:    19  Height:      Weight:      SpO2:  92% 94% 92%    Intake/Output Summary (Last 24 hours)  at 03/30/14 1127 Last data filed at 03/30/14 1052  Gross per 24 hour  Intake   1489 ml  Output    501 ml  Net    988 ml    Intake/Output last 3 shifts: I/O last 3 completed shifts: In: 3903 [P.O.:1480; I.V.:9] Out: 2451 [Urine:2450; Stool:1]   Allergies  Allergen Reactions  . Adhesive [Tape] Other (See Comments)    "I break out all over"; Can only use paper tape  . Codeine Other (See Comments)    "first dose I took, my aide found me the next day laying on the floor"  . Morphine Other (See Comments)    caused change in mental status, she does not want to try it again.  . Sulfonamide Derivatives Nausea And Vomiting  . Thorazine [Chlorpromazine] Nausea Only   Medications: Scheduled Meds: . antiseptic oral rinse  7 mL Mouth Rinse BID  . benzonatate  100 mg Oral TID  . budesonide  0.25 mg Nebulization 4 times per day  . doxycycline  100 mg Oral 2 times per day  . enoxaparin  100 mg Subcutaneous BID  . ferrous sulfate  325 mg Oral TID WC  . furosemide  40 mg Intravenous Q12H  . gabapentin  100 mg Oral TID  . ipratropium-albuterol  3 mL Nebulization Q6H  . lamoTRIgine  200 mg Oral q morning - 10a  . LORazepam  1 mg Oral q morning - 10a  .  LORazepam  2 mg Oral QHS  . methylPREDNISolone (SOLU-MEDROL) injection  40 mg Intravenous Q12H  . nicotine  14 mg Transdermal QHS  . pantoprazole  40 mg Oral Daily  . pravastatin  80 mg Oral Daily  . QUEtiapine  400 mg Oral QHS  . sertraline  200 mg Oral q morning - 10a  . sodium chloride  3 mL Intravenous Q12H  . vitamin C  500 mg Oral Daily  . Vitamin D (Ergocalciferol)  50,000 Units Oral Q7 days   Continuous Infusions:  PRN Meds:.acetaminophen, albuterol, guaiFENesin-dextromethorphan, HYDROcodone-acetaminophen, polyethylene glycol, senna-docusate  Physical Exam: GEN: AAOx4, WDWN, very upset/anxious about diagnosis and risks associated with bronch.  HEENT: PERRLA, EOMI CV: RRR, S1S2nl, no murmurs/rubs/gallops PULM: coarse, rhonchus  breathing bilaterally. No wheezing  ABD: normal/active bowel sounds, NTND, obese EXT: No edema, no tenderness    Labs: Recent Labs     03/28/14  0407  03/29/14  0325  03/30/14  0535  HGB   --    --   9.9*  HCT   --    --   34.2*  PLT   --    --   314  NA  139  138  138  K  4.4  4.0  3.5  CL  95*  95*  99  CO2  39*  36*  33*  BUN  18  21  22   CREATININE  0.71  0.74  0.64  CALCIUM  8.8  8.4  8.4  PHOS  4.7*   --   3.5    Vitals, labs, and images reviewed.   - PCXR unchanged.    ASSESSMENT AND PLAN: 69 y.o. 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 No change seen on PCXR, however O2 sats improving with slightly less O2 requirement (went from desaturating on 50% ventimask to remaining relatively stable on 6L Oxymizer nasal canula). Patient is anxious and upset after pulmonary f/u with discussion for possible need to ventilator during/after requested bronchoalveolar lavage.   Hypoxic espiratory failure: The differential remains broad for the cause of her ideopathic interstitial pneumonitis and current symptoms. Possibly diagnoses include but are no limited to hypersensitivity pneumonitis (possibly from [place]anc[place]'s lung due to prior exposure to pet bird), broncheolitis obliterans organizing pneumonia, pulmonary alveolar proteinosis, eosinophilic pathologies such as eosinophilic granuloma or acute pneumonia, and acute interstitial pneumonia. Patient is currently being treated for an atypical pneumonia with doxycycline 193m bid (she is currently on her 12th day of treatment - will continue for 2 more days) and steroid.  Results of the pending sputum culture will hopefully reveal more useful/specific information for antibiotic recommendations (especially for possible new HCAP in th  setting of high WBC although it has decreased from 13.4 to 11.7). Sjogren's A and B antibody negative. Anti-scl antibody negative. Aldolase, CK, CKMB, cyclic citrul peptide antibody IgG, hypersensitivity pneumonitis antibody pending. Crypto antigen negative, antiphospholipid syndrome negative except for phosphatydalserine IgM. Procalcitonin < 0.10. RF elevated (17.3). ESR decreased from 68 to 28. ANCA WNL. Mpo/pr-3 WNL. TSH WNL. Creatinine 0.64.   - Continue IV doxycyline 1074mbid  - Continue IV steroids - Solumedrol 8016mid  - Continue to diurese as tolerated.   - maintain O2 stats >85% (expected transient desaturation)  - continue nebs (duonebs q6 hrs, pulmicort q6 hrs and albuterol  q2 hrs PRN)  - Robitussin DM q6 hrs PRN  - Continue Tessalon Perles TID  - Continue tylenol 500 mg PRN q 6 hours  - Needs Prevnar vaccine as outpatient  - Patient tired of PT - continue to try with PT  - palliative care consulted for goals of care talk with her and her son   - continue to pursue goal of bronchoalveolar lavage according to Pulmonary recommendations   - repeat PCXR to monitor progression    Pulmonary Hypertension: Complicated by OSA, OHS. Patient refuses CPAP. Echo 2013 showed PAP 40-44. Investigating ILD. - Avoid hypoxia  Constipation and Hair Loss: TSH normal. Constipation may be secondary to opioids or iron supplementation. - Senokot-S BID PRN - Miralax daily  Chronic Iron Deficiency Anemia: Hemoglobin stable- 9.9. MCV 78.8. - Continue oral iron supplementation TID  Urge and Stress Incontinence: Macrobid has been stopped, as this has an association with pulmonary hypersensitivity. Patient had been on it for her chronic colonization because she was to receive a urodynamic study as an outpatient.  GERD:  - Continue home protonix 40 mg by mouth daily  Depression and Anxiety: Anxiety increased today due to DNR and risks association discussion with Pulmonary. Will speak with palliative care  with her son to determine goals of care.  - Continue home Ativan 1 mg by mouth in the am and 2 mg by mouth at night - Continue home Lamictal, Seroquel and Zoloft  Tobacco Abuse: Current smoker. Cessation counseling needed, especially given her extensive lung disease and respiratory failure. Nicotine patch in place.  Vitamin D Deficiency: 15 - Started ergo 50 U weekly  History of DVT and PE:  - Continue chronic lovenox 100 mg BID  Diet: Regular (if bronchoscopy scheduled, patient needs to be NPO at midnight the night before).   Decision on DNR status: After speaking with Pulmonary today after they spoke with the patient about the possibility of ventilator use during/after bronchoalveolar lavage, we agreed the patient needed to be switched back to full code due to her misunderstanding during the pulmonary consultation. Palliative Care consultation scheduled to better document her goals of care.

## 2014-03-30 NOTE — Progress Notes (Signed)
Pt is refusing to wear CPAP tonight. Pt understands that she can call if she changes her mind. RT will monitor.

## 2014-03-30 NOTE — Progress Notes (Signed)
Subjective: [name] is very worried about her prognosis. She is terrified that her son, [name], will not take it well that her prognosis has worsened. She feels that she is all he has, and doesn't want to die. She is amenable to a discussion with the Palliative Care team.  Interval Events: - On 6 L oxymizer (reservoir) - Repeat CXR unchanged - Bronchoscopy postponed / cancelled due to patient condition - Code status discussion has been initiated - Son, [name] was called, but did not answer. Message left for him to notify [place] when he has time to talk to physician  Objective: Vital signs in last 24 hours: Filed Vitals:   03/30/14 0414 03/30/14 0848 03/30/14 0852 03/30/14 0909  BP:    125/46  Pulse:    73  Temp: 97.7 F (36.5 C)   98.1 F (36.7 C)  TempSrc: Axillary   Oral  Resp:    19  Height:      Weight:      SpO2:  92% 94% 92%   Weight change:   Intake/Output Summary (Last 24 hours) at 03/30/14 1120 Last data filed at 03/30/14 1052  Gross per 24 hour  Intake   1489 ml  Output    501 ml  Net    988 ml   Physical Exam: Appearance: Morbidly obese, awake, alert, in NAD on 6 L oxymizer ([place] with reservoir) and satting 90-92% HEENT: AT/[place], PERRL, EOMi Heart: RRR, normal S1S2, no MRG Lungs: coarse, rhonchrous breathing bilaterally, stable Abdomen: BS+, obese, soft, nontender Extremities: No BLE edema, no tenderness Neurologic: A&Ox3, grossly intact  Lab Results: Basic Metabolic Panel:  Recent Labs Lab 03/28/14 0407 03/29/14 0325 03/30/14 0535  NA 139 138 138  K 4.4 4.0 3.5  CL 95* 95* 99  CO2 39* 36* 33*  GLUCOSE 202* 188* 128*  BUN _0 CREATININE 0.71 0.74 0.64  CALCIUM 8.8 8.4 8.4  MG 2.2  --  2.3  PHOS 4.7*  --  3.5   CBC:  Recent Labs Lab 03/26/14 0330 03/30/14 0535  WBC 13.4* 11.7*  HGB 9.6* 9.9*  HCT 33.8* 34.2*  MCV 79.0 78.8  PLT 400 314   Studies/Results: Dg Chest Port 1 View  03/30/2014   CLINICAL DATA:  Pneumonitis.  EXAM:  PORTABLE CHEST - 1 VIEW  COMPARISON:  03/29/2014.  FINDINGS: Mediastinum and hilar structures are stable. Stable cardiomegaly. Stable diffuse severe pulmonary infiltrates. No pleural effusion or pneumothorax. Prior cervical spine fusion.  IMPRESSION: Persistent diffuse dense bilateral pulmonary infiltrates. No change from prior exam.   Electronically Signed   By: [place]   On: 03/30/2014 07:22   Dg Chest Port 1 View  03/29/2014   CLINICAL DATA:  Increased oxygen demands. Persistent coughing and congestion.  EXAM: PORTABLE CHEST - 1 VIEW  COMPARISON:  03/28/2014  FINDINGS: Persistent diffuse asymmetric airspace process which may be slightly progressive when compared to the prior chest x-ray. No pleural effusions or pneumothorax.  IMPRESSION: Slight progression of diffuse asymmetric airspace process.   Electronically Signed   By: Marijo Sanes M.D.   On: 03/29/2014 15:12   Medications: I have reviewed the patient's current medications. Scheduled Meds: . antiseptic oral rinse  7 mL Mouth Rinse BID  . benzonatate  100 mg Oral TID  . budesonide  0.25 mg Nebulization 4 times per day  . doxycycline  100 mg Oral 2 times per day  . enoxaparin  100 mg Subcutaneous BID  . ferrous  sulfate  325 mg Oral TID WC  . furosemide  40 mg Intravenous Q12H  . gabapentin  100 mg Oral TID  . ipratropium-albuterol  3 mL Nebulization Q6H  . lamoTRIgine  200 mg Oral q morning - 10a  . LORazepam  1 mg Oral q morning - 10a  . LORazepam  2 mg Oral QHS  . methylPREDNISolone (SOLU-MEDROL) injection  40 mg Intravenous Q12H  . nicotine  14 mg Transdermal QHS  . pantoprazole  40 mg Oral Daily  . pravastatin  80 mg Oral Daily  . QUEtiapine  400 mg Oral QHS  . sertraline  200 mg Oral q morning - 10a  . sodium chloride  3 mL Intravenous Q12H  . vitamin C  500 mg Oral Daily  . 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 Her bronchoscopy has been postponed and pulmonology believes her prognosis to be very poor. She is amenable to meeting with Palliative care.  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 Differential remains broad (Broncheolitis obliterans organizing pneumonia, pulmonary alveolar proteinosis, hypersensitivity pneumonitis, eosinophilic granuloma, desquamative interstitial pneumonia, acute interstitial pneumonia, acute eosinophilic pneumonia, sarcoid). Prior exposure to birds and macrobid. Initial concern for infection, with 3 days of coverage for CAP. WBC 11.7. Presently on doxycycline and steroid. CXR 03/30/14 no change. Repeat CCP antibody, hypersensitivity panel, aldolase, cyclic citrul peptide antibody IgG, hypersensitivity pneumonitis, sjogren's A and B antibody pending. Crypto antigen negative, antiphospholipid syndrome negative except for phosphatydalserine IgM. Procalcitonin 1.46 with baseline 0.39. RF elevated (17.3). Scl-70 negative. ESR elevated (68). ANCA WNL. CK and CKMB WNL. Mpo/pr-3 WNL. TSH WNL. Creatinine .0.74. Repeat procalcitonin <0.10,  - Appreciate PCCM recommendations - Plan was for bronchoscopy with BAL,but cancelled due to patient's hypoxia - Goal of bronchoscopy was to be for BAL from lingula / RML. Attempt to investigate for cell count and differential, eos, micro, cytology, gram stain, PCP, legionella, CD4:CD8 ratio flow cytometry (to be sent to Dr. Susanne Greenhouse at [facility]), rule out alveolar proteinosis, increase diagnostics for pneumonitis (which could encourage use of long-term steroids) - Continue IV steroids- decrease solumedrol from 60m BID-->40 mg BID - Continue IV Doxycycline (started 2/28-->to finish 14 day course on 3/12) - Resume lasix 40 mg IV q 12 hours - Trend BP, BUN, Cr  - Continue to wean off oxygen as  tolerated - Continue nebs (duonebs q6 hours, pulmicort q6 hours and albuterol q2 hours PRN) - Robitussin-DM q6 hours PRN - Continue Tessalon Perles TID - Continue tylenol 500 mg PRN q 6 hours - Needs Prevnar vaccine as outpatient - PT and OT- recs- SNF, patient refusing  Possible New HCAP: White count 11.7. - Repeat sputum culture pending (adequate sample)  Mediastinal and Hilar Lymphadenopathy: ACE 95 in 2009-->50 in 02/2014. Not a candidate for biopsy.  Pulmonary Hypertension: Complicated by OSA, OHS. Patient refuses CPAP. Echo 2013 showed PAP 40-44. Investigating ILD. - Avoid hypoxia  Constipation and Hair Loss: TSH normal. Constipation may be secondary to opioids or iron supplementation. - Senokot-S BID PRN - Miralax daily  Chronic Iron Deficiency Anemia: Hemoglobin stable- 9.6. MCV 79.0. - Continue oral iron supplementation TID  Urge and Stress Incontinence: Macrobid has been stopped, as this has an association with pulmonary hypersensitivity. Patient had been on it for her chronic  colonization because she was to receive a urodynamic study as an outpatient.  GERD:  - Continue home protonix 40 mg by mouth daily  Depression and Anxiety: Anxiety about health has been present on this admission; improved somewhat. - Continue home Ativan 1 mg by mouth in the am and 2 mg by mouth at night - Continue home Lamictal, Seroquel and Zoloft  Chronic Low Back Pain: Patient has not been using her PRN Norco. - Continue home Norco q6 hours PRN   Tobacco Abuse: Current smoker.  - Cessation counseling - Nicotine patch in place  Vitamin D Deficiency: 15 - Started ergo 50 U weekly  History of DVT and PE:  - Continue chronic lovenox 100 mg BID  Diet: Regular  Dispo: Disposition is deferred at this time, awaiting improvement of current medical problems.     LOS: 17 days   Karlene Einstein, MD 03/30/2014, 11:20 AM

## 2014-03-30 NOTE — Progress Notes (Signed)
Name: [name] MRN: [id number] DOB: December 22, 1945    ADMISSION DATE:  03/13/2014 CONSULTATION DATE:  03/30/2014  REFERRING MD :  Eppie Gibson ([facility])  CHIEF COMPLAINT:  SOB  BRIEF PATIENT DESCRIPTION:  69 y.o. F brought to ED 2/22 for SOB.  CXR revealed b/l infiltrates.  She was admitted and treated for CAP.  CT chest a few days later revealed progressive ILD.  During early AM hours 2/29, pt desaturated to 35 despite 4L O2 via [place].  She was placed on NRB and PCCM consulted for further recs.  SIGNIFICANT EVENTS  2/22 - admit 2/29 - desaturated to 35, placed on NRB, PCCM consulted 3/7- on lower fio2 3/10 PCXR worsening  STUDIES:  CXR 2/22 >>> diffuse lung opacification superimposed on ILD. 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 GGO's in crazy paving pattern.  Numerous enlarged mediastinal and hilar lymph nodes.  SUBJECTIVE: did well with reservoir O2 nasal cannula  VITAL SIGNS: Temp:  [97.7 F (36.5 C)-98.4 F (36.9 C)] 98.1 F (36.7 C) (03/10 0909) Pulse Rate:  [69-93] 73 (03/10 0909) Resp:  [15-26] 19 (03/10 0909) BP: (123-146)/(46-109) 125/46 mmHg (03/10 0909) SpO2:  [86 %-94 %] 92 % (03/10 0909)  Intake/Output Summary (Last 24 hours) at 03/30/14 0936 Last data filed at 03/30/14 0900  Gross per 24 hour  Intake   1486 ml  Output    501 ml  Net    985 ml   PHYSICAL EXAMINATION: General: Morbidly obese female, no distress Neuro: A&O x 3, non-focal anxious at times HEENT: /AT. PERRL Cardiovascular: RRR s1 s2  Lungs: unchanged anterior clear, reduced, slight coarse Abdomen: Obese, BS x 4, soft, NT/ND.  Musculoskeletal: No gross deformities edema unchanged Skin: Intact, warm, no rashes.   Recent Labs Lab 03/28/14 0407 03/29/14 0325 03/30/14 0535  NA 139 138 138  K 4.4 4.0 3.5  CL 95* 95* 99  CO2 39* 36* 33*  BUN 18 21 22   CREATININE 0.71 0.74 0.64   GLUCOSE 202* 188* 128*    Recent Labs Lab 03/24/14 1040 03/26/14 0330 03/30/14 0535  HGB 9.2* 9.6* 9.9*  HCT 32.3* 33.8* 34.2*  WBC 12.7* 13.4* 11.7*  PLT 354 400 314   Dg Chest Port 1 View  03/30/2014   CLINICAL DATA:  Pneumonitis.  EXAM: PORTABLE CHEST - 1 VIEW  COMPARISON:  03/29/2014.  FINDINGS: Mediastinum and hilar structures are stable. Stable cardiomegaly. Stable diffuse severe pulmonary infiltrates. No pleural effusion or pneumothorax. Prior cervical spine fusion.  IMPRESSION: Persistent diffuse dense bilateral pulmonary infiltrates. No change from prior exam.   Electronically Signed   By: [place]   On: 03/30/2014 07:22   Dg Chest Port 1 View  03/29/2014   CLINICAL DATA:  Increased oxygen demands. Persistent coughing and congestion.  EXAM: PORTABLE CHEST - 1 VIEW  COMPARISON:  03/28/2014  FINDINGS: Persistent diffuse asymmetric airspace process which may be slightly progressive when compared to the prior chest x-ray. No pleural effusions or pneumothorax.  IMPRESSION: Slight progression of diffuse asymmetric airspace process.   Electronically Signed   By: Marijo Sanes M.D.   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  Mediastinal and hilar lymphadenopathy - chronic dating back to 2011. >may have some response to  roids  > Noted not a candidate in last pccm consult for open lung , and likley to show DAD with steroid exposure with higher risk for OR Recs: DuoNebs, Albuterol  Will re order high flow O2 nasal cannula reservoir  Early involvement of palliative care per primary - please consider I have had extensive discussions with her. We discussed patients current circumstances and organ failures. We also discussed patient's prior wishes under circumstances such as this. Patient has  decided to NOT perform resuscitation if arrest but to continue current medical support for now. Does NOT want intubation under any circumstances No BX, poor candidate at this stage Worsen pcxr, bronch would harm and minimal to gain at this stage, re eval in am  Does not appear that steroid are helping, will reduce slight Will follow repeat labs sent by Dr Chase Caller, awaited still Repeat pcxr am  ? New HCAP - Doubtful but will evaluate further given worsening leukocytosis (although she is on steroids), brown tinged sputum, worsening hypoxia and tachypnea. Recs: No fevers Culture remains neg  OSA / probable OHS Recs: Continue nocturnal CPAP  Pulmonary Hypertension (echo from 2013, PAP 40-44) - Class 3 likely due to chronic lung disease and OSA. 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 Titus Mould, MD, [place] Pgr: [facility] 03/30/2014 9:36 AM

## 2014-03-31 ENCOUNTER — Inpatient Hospital Stay (HOSPITAL_COMMUNITY): Payer: Medicare Other

## 2014-03-31 ENCOUNTER — Encounter: Payer: Medicaid Other | Admitting: Internal Medicine

## 2014-03-31 DIAGNOSIS — Z789 Other specified health status: Secondary | ICD-10-CM

## 2014-03-31 DIAGNOSIS — J9601 Acute respiratory failure with hypoxia: Secondary | ICD-10-CM

## 2014-03-31 LAB — EXPECTORATED SPUTUM ASSESSMENT W GRAM STAIN, RFLX TO RESP C

## 2014-03-31 LAB — BASIC METABOLIC PANEL
Anion gap: 7 (ref 5–15)
BUN: 18 mg/dL (ref 6–23)
CHLORIDE: 95 mmol/L — AB (ref 96–112)
CO2: 38 mmol/L — AB (ref 19–32)
Calcium: 8.3 mg/dL — ABNORMAL LOW (ref 8.4–10.5)
Creatinine, Ser: 0.69 mg/dL (ref 0.50–1.10)
GFR calc non Af Amer: 87 mL/min — ABNORMAL LOW (ref >90)
Glucose, Bld: 141 mg/dL — ABNORMAL HIGH (ref 70–99)
Potassium: 3.8 mmol/L (ref 3.5–5.1)
Sodium: 140 mmol/L (ref 135–145)

## 2014-03-31 LAB — EXPECTORATED SPUTUM ASSESSMENT W REFEX TO RESP CULTURE

## 2014-03-31 MED ORDER — HYDROCODONE-ACETAMINOPHEN 10-325 MG PO TABS
1.0000 | ORAL_TABLET | Freq: Four times a day (QID) | ORAL | Status: DC | PRN
  Filled 2014-03-31: qty 2

## 2014-03-31 MED ORDER — SERTRALINE HCL 100 MG PO TABS
250.0000 mg | ORAL_TABLET | Freq: Every morning | ORAL | Status: DC
  Administered 2014-04-01 – 2014-04-06 (×6): 250 mg via ORAL
  Filled 2014-03-31 (×2): qty 1
  Filled 2014-03-31: qty 2
  Filled 2014-03-31 (×3): qty 1
  Filled 2014-03-31: qty 2
  Filled 2014-03-31: qty 1
  Filled 2014-03-31: qty 2

## 2014-03-31 MED ORDER — METHYLPREDNISOLONE SODIUM SUCC 125 MG IJ SOLR
125.0000 mg | Freq: Three times a day (TID) | INTRAMUSCULAR | Status: AC
  Administered 2014-03-31 – 2014-04-02 (×6): 125 mg via INTRAVENOUS
  Filled 2014-03-31 (×4): qty 2

## 2014-03-31 MED ORDER — PRAVASTATIN SODIUM 40 MG PO TABS
40.0000 mg | ORAL_TABLET | Freq: Every day | ORAL | Status: DC
  Administered 2014-04-01 – 2014-04-06 (×6): 40 mg via ORAL
  Filled 2014-03-31 (×3): qty 1
  Filled 2014-03-31: qty 4
  Filled 2014-03-31: qty 1
  Filled 2014-03-31: qty 4
  Filled 2014-03-31 (×2): qty 1

## 2014-03-31 MED ORDER — LORAZEPAM 2 MG/ML IJ SOLN: 1.0000 mg | INTRAMUSCULAR | Status: DC | PRN

## 2014-03-31 NOTE — Progress Notes (Signed)
Name: [name] MRN: [id number] DOB: [birth year]    ADMISSION DATE:  03/13/2014 CONSULTATION DATE:  03/31/2014  REFERRING MD :  Eppie Gibson ([facility])  CHIEF COMPLAINT:  SOB  BRIEF PATIENT DESCRIPTION:  69 y.o. F brought to ED 2/22 for SOB.  CXR revealed b/l infiltrates.  She was admitted and treated for CAP.  CT chest a few days later revealed progressive ILD.  On review of  imaging, ILD dates back to 2010. Seen by [place] in the past  SIGNIFICANT EVENTS  2/22 - admit 2/29 - desaturated to 35, placed on NRB, PCCM consulted 3/7- on lower fio2 3/10 PCXR worsening  STUDIES:  CXR 2/22 >>> diffuse lung opacification superimposed on ILD. 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 GGO's in crazy paving pattern.  Numerous enlarged mediastinal and hilar lymph nodes.  SUBJECTIVE:  Tolerating reservoir O2 nasal cannula 10 L   VITAL SIGNS: Temp:  [97.7 F (36.5 C)-98.4 F (36.9 C)] 97.7 F (36.5 C) (03/11 0745) Pulse Rate:  [71-91] 72 (03/11 1200) Resp:  [13-23] 13 (03/11 1200) BP: (111-150)/(48-130) 118/78 mmHg (03/11 1200) SpO2:  [86 %-100 %] 100 % (03/11 1200) FiO2 (%):  [100 %] 100 % (03/11 0301)  Intake/Output Summary (Last 24 hours) at 03/31/14 1507 Last data filed at 03/31/14 0145  Gross per 24 hour  Intake      3 ml  Output   1400 ml  Net  -1397 ml   PHYSICAL EXAMINATION: General: Morbidly obese female, no distress Neuro: A&O x 3, non-focal anxious at times HEENT: [place]/AT. PERRL Cardiovascular: RRR s1 s2  Lungs: unchanged anterior clear,  coarse crackles posterior basal Abdomen: Obese, BS x 4, soft, NT/ND.  Musculoskeletal: No gross deformities edema unchanged Skin: Intact, warm, no rashes.   Recent Labs Lab 03/29/14 0325 03/30/14 0535 03/31/14 0316  NA 138 138 140  K 4.0 3.5 3.8  CL 95* 99 95*  CO2 36* 33* 38*  BUN 21 22 18   CREATININE 0.74 0.64 0.69  GLUCOSE  188* 128* 141*    Recent Labs Lab 03/26/14 0330 03/30/14 0535  HGB 9.6* 9.9*  HCT 33.8* 34.2*  WBC 13.4* 11.7*  PLT 400 314   Dg Chest Port 1 View  03/31/2014   CLINICAL DATA:  Interstitial lung disease.  EXAM: PORTABLE CHEST - 1 VIEW  COMPARISON:  03/30/2014 .  03/22/2014.  01/21/2013.  FINDINGS: Stable cardiomegaly. Diffuse dense bilateral pulmonary pulmonary infiltrates. No pleural effusion or pneumothorax. Prior cervical spine fusion.  IMPRESSION: Diffuse dense unchanged bilateral pulmonary infiltrates.   Electronically Signed   By: [place]   On: 03/31/2014 07:52   Dg Chest Port 1 View  03/30/2014   CLINICAL DATA:  Pneumonitis.  EXAM: PORTABLE CHEST - 1 VIEW  COMPARISON:  03/29/2014.  FINDINGS: Mediastinum and hilar structures are stable. Stable cardiomegaly. Stable diffuse severe pulmonary infiltrates. No pleural effusion or pneumothorax. Prior cervical spine fusion.  IMPRESSION: Persistent diffuse dense bilateral pulmonary infiltrates. No change from prior exam.   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  Mediastinal and hilar lymphadenopathy - chronic dating back to 2011. Radiology not typical for IPF > Minimal response to steroids   Recs: DuoNebs,  Albuterol  ct high flow O2 nasal cannula reservoir  will treat as IPF flare with high-dose Solu-Medrol 125 every 86 doses Doubt bronchoscopy will add much to management.   ? New HCAP - Doubtful , note low pro-calcitonin Recs: Culture remains neg  OSA / probable OHS Recs: Continue nocturnal CPAP  Pulmonary Hypertension (echo from 2013, PAP 40-44) - Class 3 likely due to chronic lung disease and OSA. Recs: goal sats 92% or greater    Known DVT / PE (2011) - on lifelong  lovenox Recs: Continue therapeutic dose Lovenox  I gave her a poor prognosis -due to persistent hypoxia. Not improved with steroids for 2 weeks. Would advocate for care limitation She expressed her desire to go home for a few days-this may be difficult given high flow oxygen requirements, but can attempt to do so under hospice  [facility] V. MD  03/31/2014 3:07 PM

## 2014-03-31 NOTE — Progress Notes (Addendum)
Subjective: [name] feels "okay" today; she is tired.   Interval Events: - On  L oxymizer (reservoir) - Needed venti-mask, dipped to 60s% multiple times - Repeat CXR 3/11 unchanged dense infiltrates bilaterally - Bronchoscopy postponed / cancelled due to patient condition - Code status discussion has been initiated - Palliative Care to see the patient - Son, [name] was called on 3/10, did not answer. Message left for him to notify [place] with time when he can talk to physician; patient aware  Objective: Vital signs in last 24 hours: Filed Vitals:   03/31/14 0145 03/31/14 0301 03/31/14 0430 03/31/14 0600  BP: 122/53  130/56   Pulse: 73  71 73  Temp:   97.7 F (36.5 C)   TempSrc:   Axillary   Resp: 14  16 16   Height:      Weight:      SpO2: 88% 92% 93% 92%   Weight change:   Intake/Output Summary (Last 24 hours) at 03/31/14 0707 Last data filed at 03/31/14 0145  Gross per 24 hour  Intake    546 ml  Output   2250 ml  Net  -1704 ml   Physical Exam: Appearance: Morbidly obese, awake, alert, in NAD on 9 L oxymizer ([place] with reservoir) and satting 92-96% HEENT: AT/[place], PERRL, EOMi Heart: RRR, normal S1S2, no MRG Lungs: coarse, rhonchrous breathing bilaterally, stable Abdomen: BS+, obese, soft, nontender Extremities: No BLE edema, no tenderness Neurologic: A&Ox3, grossly intact  Lab Results: Basic Metabolic Panel:  Recent Labs Lab 03/28/14 0407  03/30/14 0535 03/31/14 0316  NA 139  < > 138 140  K 4.4  < > 3.5 3.8  CL 95*  < > 99 95*  CO2 39*  < > 33* 38*  GLUCOSE 202*  < > 128* 141*  BUN 18  < > 22 18  CREATININE 0.71  < > 0.64 0.69  CALCIUM 8.8  < > 8.4 8.3*  MG 2.2  --  2.3  --   PHOS 4.7*  --  3.5  --   < > = values in this interval not displayed. CBC:  Recent Labs Lab 03/26/14 0330 03/30/14 0535  WBC 13.4* 11.7*  HGB 9.6* 9.9*  HCT 33.8* 34.2*  MCV 79.0 78.8  PLT 400 314   Studies/Results: Dg Chest Port 1 View  03/30/2014   CLINICAL DATA:   Pneumonitis.  EXAM: PORTABLE CHEST - 1 VIEW  COMPARISON:  03/29/2014.  FINDINGS: Mediastinum and hilar structures are stable. Stable cardiomegaly. Stable diffuse severe pulmonary infiltrates. No pleural effusion or pneumothorax. Prior cervical spine fusion.  IMPRESSION: Persistent diffuse dense bilateral pulmonary infiltrates. No change from prior exam.   Electronically Signed   By: Lyons   On: 03/30/2014 07:22   Dg Chest Port 1 View  03/29/2014   CLINICAL DATA:  Increased oxygen demands. Persistent coughing and congestion.  EXAM: PORTABLE CHEST - 1 VIEW  COMPARISON:  03/28/2014  FINDINGS: Persistent diffuse asymmetric airspace process which may be slightly progressive when compared to the prior chest x-ray. No pleural effusions or pneumothorax.  IMPRESSION: Slight progression of diffuse asymmetric airspace process.   Electronically Signed   By: Marijo Sanes M.D.   On: 03/29/2014 15:12   Medications: I have reviewed the patient's current medications. Scheduled Meds: . antiseptic oral rinse  7 mL Mouth Rinse BID  . benzonatate  100 mg Oral TID  . budesonide  0.25 mg Nebulization 4 times per day  . doxycycline  100 mg  Oral 2 times per day  . enoxaparin  100 mg Subcutaneous BID  . ferrous sulfate  325 mg Oral TID WC  . furosemide  40 mg Intravenous Q12H  . gabapentin  100 mg Oral TID  . ipratropium-albuterol  3 mL Nebulization Q6H  . lamoTRIgine  200 mg Oral q morning - 10a  . LORazepam  1 mg Oral q morning - 10a  . LORazepam  2 mg Oral QHS  . methylPREDNISolone (SOLU-MEDROL) injection  40 mg Intravenous Q12H  . nicotine  14 mg Transdermal QHS  . pantoprazole  40 mg Oral Daily  . pravastatin  80 mg Oral Daily  . QUEtiapine  400 mg Oral QHS  . sertraline  200 mg Oral q morning - 10a  . sodium chloride  3 mL Intravenous Q12H  . vitamin C  500 mg Oral Daily  . 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 Her bronchoscopy has been postponed and pulmonology believes her prognosis to be very poor. She is very emotional about her health, but amenable to meeting with Palliative Care.  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 Differential remains broad (Broncheolitis obliterans organizing pneumonia, pulmonary alveolar proteinosis, hypersensitivity pneumonitis, eosinophilic granuloma, desquamative interstitial pneumonia, acute interstitial pneumonia, acute eosinophilic pneumonia, sarcoid). Prior exposure to birds and macrobid. WBC 11.7. Afebrile. BP WNL. Presently on doxycycline, steroid and lasix. CXR 03/31/14 with unchanged b/l dense infiltrates. Repeat hypersensitivity panel, aldolase, cyclic citrul peptide antibody IgG, hypersensitivity pneumonitis, sjogren's A and B antibody pending. Crypto antigen negative, antiphospholipid syndrome negative except for phosphatydalserine IgM. Procalcitonin 1.46 with baseline 0.39. RF elevated (17.3). Scl-70 negative. ESR elevated (68). ANCA WNL. CK and CKMB WNL. Mpo/pr-3 WNL. TSH WNL. Creatinine .0.69. Repeat procalcitonin <0.10. - Appreciate PCCM recommendations - Plan was for bronchoscopy with BAL,but cancelled due to patient's hypoxia - Goal of bronchoscopy was to be for BAL from lingula / RML. Attempt to investigate for cell count and differential, eos, micro, cytology, gram stain, PCP, legionella, CD4:CD8 ratio flow cytometry (to be sent to Dr. Susanne Greenhouse at [facility]), rule out alveolar proteinosis, increase diagnostics for pneumonitis (which could encourage use of long-term steroids) - Continue IV steroids at decreased rate of 40 mg BID - Continue IV Doxycycline (started 2/28-->to finish 14 day course on 3/12) - Resume lasix 40 mg IV q 12 hours - Trend BP, BUN, Cr  - Continue to wean off oxygen as  tolerated - Continue nebs (duonebs q6 hours, pulmicort q6 hours and albuterol q2 hours PRN) - Robitussin-DM q6 hours PRN - Continue Tessalon Perles TID - Continue tylenol 500 mg PRN q 6 hours - Needs Prevnar vaccine as outpatient - PT and OT- recs- SNF, patient refusing  Possible New HCAP: White count 11.7. - Repeat sputum culture pending (sample needs to be collected)  Mediastinal and Hilar Lymphadenopathy: ACE 95 in 2009-->50 in 02/2014. Not a candidate for biopsy.  Pulmonary Hypertension: Complicated by OSA, OHS. Patient refuses CPAP. Echo 2013 showed PAP 40-44. Investigating ILD. - Avoid hypoxia  Constipation and Hair Loss: TSH normal. Constipation may be secondary to opioids or iron supplementation. - Senokot-S BID PRN - Miralax daily  Chronic Iron Deficiency Anemia: Hemoglobin stable- 9.6. MCV 79.0. - Continue oral iron supplementation TID  Urge and Stress Incontinence: Macrobid has  been stopped, as this has an association with pulmonary hypersensitivity. Patient had been on it for her chronic colonization because she was to receive a urodynamic study as an outpatient.  GERD:  - Continue home protonix 40 mg by mouth daily  Depression and Anxiety: Anxiety about health has been present on this admission; improved somewhat. - Continue home Ativan 1 mg by mouth in the am and 2 mg by mouth at night - Continue home Lamictal, Seroquel and Zoloft - Continue discussions about goals of care, wishes for dispo, appreciate Palliative input  Chronic Low Back Pain: Patient has not been using her PRN Norco. - Continue home Norco q6 hours PRN   Tobacco Abuse: Current smoker.  - Cessation counseling - Nicotine patch in place  Vitamin D Deficiency: 15 - Started ergo 50 U weekly  History of DVT and PE:  - Continue chronic lovenox 100 mg BID  Diet: Regular  Dispo: Disposition is deferred at this time, awaiting improvement of current medical problems.     LOS: 18 days   Karlene Einstein, MD 03/31/2014, 7:06 AM

## 2014-03-31 NOTE — Progress Notes (Signed)
Occupational Therapy Treatment Patient Details Name: SALLY-ANN CUTBIRTH MRN: [id number] DOB: [birth year] Today's Date: 03/31/2014    History of present illness 69 yo female with onset of fever, cough, SOB was admitted with acute respiratory failure and CAP. Transfered to step down due increased WOB 03/20/14 and placed on NRB.   OT comments  Pt performed bil. UE exercises bed level.  Sats 89-95%.   Pt voiced that she knows she can not return home given current status and prognosis.  She was very sad and tearful during OT session - allowed her to talk.  She is unsure if she wants to OT and PT to continue, but asks that we check on her next week.  Encouraged her to perform bil. UE AROM a few times per day.   Her current goals are no longer appropriate, but will avoid re-establishing goals until she makes a decision about continuing vs. Discharging OT.    Follow Up Recommendations  SNF    Equipment Recommendations  None recommended by OT    Recommendations for Other Services      Precautions / Restrictions Precautions Precautions: Fall Precaution Comments: watch sats       Mobility Bed Mobility                  Transfers                      Balance                                   ADL                                         General ADL Comments: Pt performed 10 reps shoulder flexion each UE.  Sats dropped to 89%, but rebounded quickly.  Pt indicates she knows she won't be able to return home.  She voiced fear and frustration  about prognosis.  Allowed her to talk.  Discussed if she wished for therapy to continue working with her, if she would like [place] to discharge.  She is not sure at this time, and asked that we check back early next week.  Encouraged her to perform light UE exercises, and she agreed.       Vision                     Perception     Praxis      Cognition   Behavior During Therapy: Anxious Overall  Cognitive Status: Within Functional Limits for tasks assessed                       Extremity/Trunk Assessment               Exercises     Shoulder Instructions       General Comments      Pertinent Vitals/ Pain       Pain Assessment: No/denies pain  Home Living                                          Prior Functioning/Environment              Frequency  Min 2X/week     Progress Toward Goals  OT Goals(current goals can now be found in the care plan section)  Progress towards OT goals: Not progressing toward goals - comment (due to medical issues.  Will re-establish goals next week)  ADL Goals Pt Will Transfer to Toilet: with modified independence;ambulating;bedside commode Pt Will Perform Toileting - Clothing Manipulation and hygiene: with modified independence;sit to/from stand Additional ADL Goal #1: Pt will be aware of energy conservation stratgies that may be of benefit to her at home Additional ADL Goal #2: Will asses need for AE at home and issue as needed (medicaid secondary)  Plan Discharge plan remains appropriate    Co-evaluation                 End of Session Equipment Utilized During Treatment: Oxygen   Activity Tolerance Patient tolerated treatment well   Patient Left in bed;with call bell/phone within reach   Nurse Communication Mobility status        Time: 0938-1829 OT Time Calculation (min): 14 min  Charges: OT General Charges $OT Visit: 1 Procedure OT Treatments $Therapeutic Exercise: 8-22 mins  Aubryn Spinola M 03/31/2014, 5:57 PM
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
## 2014-04-01 DIAGNOSIS — J679 Hypersensitivity pneumonitis due to unspecified organic dust: Secondary | ICD-10-CM

## 2014-04-01 DIAGNOSIS — R918 Other nonspecific abnormal finding of lung field: Secondary | ICD-10-CM

## 2014-04-01 MED ORDER — FENTANYL 12 MCG/HR TD PT72
12.5000 ug | MEDICATED_PATCH | TRANSDERMAL | Status: DC
  Administered 2014-04-01 – 2014-04-04 (×2): 12.5 ug via TRANSDERMAL
  Filled 2014-04-01 (×2): qty 1

## 2014-04-01 MED ORDER — PANTOPRAZOLE SODIUM 40 MG PO TBEC
40.0000 mg | DELAYED_RELEASE_TABLET | Freq: Two times a day (BID) | ORAL | Status: DC
  Administered 2014-04-01 – 2014-04-06 (×11): 40 mg via ORAL
  Filled 2014-04-01 (×11): qty 1

## 2014-04-01 NOTE — Progress Notes (Signed)
Subjective: Ms. Traxler feels "down" today. She has been replaying her conversation with the Palliative Care team, and she is very sad about her prognosis. She is worried about her insurance with this hospital stay, concerned that she will not be allowed to stay for over 30 days.  Interval Events: - On 7.5 L oxymizer (reservoir) - Needed venti-mask overnight, dipped to 80s% multiple times - Repeat CXR 3/11 unchanged dense infiltrates bilaterally - Bronchoscopy postponed / cancelled due to patient condition - Patient is now DNR (changed from full code on 3/11) - Son, [name] to join in family meeting on 3/14   Objective: Vital signs in last 24 hours: Filed Vitals:   04/01/14 0300 04/01/14 0400 04/01/14 0851 04/01/14 1009  BP: 126/52 134/48 135/59   Pulse: 67 76 75   Temp:   97.6 F (36.4 C)   TempSrc:  Oral Oral   Resp: _0 Height:      Weight:      SpO2: 95% 88% 93% 89%   Weight change:   Intake/Output Summary (Last 24 hours) at 04/01/14 1028 Last data filed at 04/01/14 1017  Gross per 24 hour  Intake    483 ml  Output    925 ml  Net   -442 ml   Physical Exam: Appearance: Morbidly obese, awake, alert, in NAD on 7.5 L oxymizer ([place] with reservoir) and satting 90-92% HEENT: AT/[place], PERRL, EOMi Heart: RRR, normal S1S2, no MRG Lungs: coarse, rhonchrous breathing bilaterally, stable Abdomen: BS+, obese, soft, nontender Extremities: No BLE edema, no tenderness Neurologic: A&Ox3, grossly intact  Lab Results: Basic Metabolic Panel:  Recent Labs Lab 03/28/14 0407  03/30/14 0535 03/31/14 0316  NA 139  < > 138 140  K 4.4  < > 3.5 3.8  CL 95*  < > 99 95*  CO2 39*  < > 33* 38*  GLUCOSE 202*  < > 128* 141*  BUN 18  < > 22 18  CREATININE 0.71  < > 0.64 0.69  CALCIUM 8.8  < > 8.4 8.3*  MG 2.2  --  2.3  --   PHOS 4.7*  --  3.5  --   < > = values in this interval not displayed. CBC:  Recent Labs Lab 03/26/14 0330 03/30/14 0535  WBC 13.4* 11.7*  HGB 9.6*  9.9*  HCT 33.8* 34.2*  MCV 79.0 78.8  PLT 400 314   Studies/Results: Dg Chest Port 1 View  03/31/2014   CLINICAL DATA:  Interstitial lung disease.  EXAM: PORTABLE CHEST - 1 VIEW  COMPARISON:  03/30/2014 .  03/22/2014.  01/21/2013.  FINDINGS: Stable cardiomegaly. Diffuse dense bilateral pulmonary pulmonary infiltrates. No pleural effusion or pneumothorax. Prior cervical spine fusion.  IMPRESSION: Diffuse dense unchanged bilateral pulmonary infiltrates.   Electronically Signed   By: Marcello Moores  Register   On: 03/31/2014 07:52   Medications: I have reviewed the patient's current medications. Scheduled Meds: . antiseptic oral rinse  7 mL Mouth Rinse BID  . benzonatate  100 mg Oral TID  . budesonide  0.25 mg Nebulization 4 times per day  . doxycycline  100 mg Oral 2 times per day  . enoxaparin  100 mg Subcutaneous BID  . ferrous sulfate  325 mg Oral TID WC  . furosemide  40 mg Intravenous Q12H  . gabapentin  100 mg Oral TID  . ipratropium-albuterol  3 mL Nebulization Q6H  . lamoTRIgine  200 mg Oral q morning - 10a  . LORazepam  1 mg Oral q morning - 10a  . LORazepam  2 mg Oral QHS  . methylPREDNISolone (SOLU-MEDROL) injection  125 mg Intravenous 3 times per day  . nicotine  14 mg Transdermal QHS  . pantoprazole  40 mg Oral Daily  . pravastatin  40 mg Oral Daily  . QUEtiapine  400 mg Oral QHS  . sertraline  250 mg Oral q morning - 10a  . sodium chloride  3 mL Intravenous Q12H  . vitamin C  500 mg Oral Daily   Assessment/Plan:  Ms. Sannes is a 69 yo woman who is a current smoker who has been found to be in hypoxic respiratory failure with worsening of her idiopathic interstitial pneumonia. Her bronchoscopy has been postponed and pulmonology believes her prognosis to be very poor. She is very emotional about her current condition. A follow-up Palliative Care family meeting has been scheduled for 3/14.  Acute on Chronic Hypoxic Respiratory Failure: Differential for causes of her interstitial  lung disease and exacerbating factors remains broad (Broncheolitis obliterans organizing pneumonia, pulmonary alveolar proteinosis, hypersensitivity pneumonitis, eosinophilic granuloma, desquamative interstitial pneumonia, acute interstitial pneumonia, acute eosinophilic pneumonia, sarcoid). Prior exposure to birds and macrobid. Bronchoscopy or open lung biopsy is not a viable option at this point, in a high risk patient where results would not change management (given severity of disease). WBC 11.7. Afebrile. BP WNL. Has been treated here with full course of doxycycline, increased amount of solumedrol and lasix. CXR 03/31/14 with unchanged b/l dense infiltrates. Repeat hypersensitivity panel, aldolase, cyclic citrul peptide antibody IgG, hypersensitivity pneumonitis pending. Repeat sjogren's A and B antibody negative, crypto antigen negative, antiphospholipid syndrome negative except for phosphatydalserine IgM. Procalcitonin 1.46 with baseline 0.39--> less than 0.10. RF elevated (17.3). Scl-70 negative. ESR elevated (68). ANCA WNL. CK and CKMB WNL. Mpo/pr-3 WNL. TSH WNL. Creatinine .0.69. - Appreciate PCCM recommendations - Increased IV steroids yesterday to 125 mg q8 hours for 6 doses (from 40 mg BID); will expire today - Added pantoprazole while on steroids - D/c IV Doxycycline (started 2/28-->finished 14 day course today) - Resume lasix 40 mg IV q 12 hours - Continue to wean off oxygen as tolerated - Continue nebs (duonebs q6 hours, pulmicort q6 hours and albuterol q2 hours PRN) - Robitussin-DM q6 hours PRN - Continue Tessalon Perles TID - Continue tylenol 500 mg PRN q 6 hours - Needs Prevnar vaccine as outpatient - PT and OT- recs- SNF, palliative care helping to establish dispo goals, family meeting 3/14 - Repeat sputum culture pending (sample needs to be collected)  Mediastinal and Hilar Lymphadenopathy: ACE 95 in 2009-->50 in 02/2014. Not a candidate for biopsy.  Pulmonary Hypertension:  Complicated by OSA, OHS. Patient refuses CPAP. Echo 2013 showed PAP 40-44. Investigating ILD. - Avoid hypoxia  Constipation and Hair Loss: TSH normal. Constipation may be secondary to opioids or iron supplementation. - Senokot-S BID PRN - Miralax daily  Chronic Iron Deficiency Anemia: Hemoglobin stable- 9.6. MCV 79.0. - Continue oral iron supplementation TID  Urge and Stress Incontinence: Macrobid has been stopped, as this has an association with pulmonary hypersensitivity. Patient had been on it for her chronic colonization because she was to receive a urodynamic study as an outpatient.  GERD:  - Continue home protonix 40 mg by mouth daily  Depression and Anxiety: Anxiety about health has been present on this admission. - Continue home Ativan 1 mg by mouth in the am and 2 mg by mouth at night; added ativan 1 mg in the morning  and 1 mg PRN for anxiety - Continue home Lamictal, Seroquel - Increased home Zoloft dosing 250 mg daily - Continue discussions about goals of care, wishes for dispo, appreciate Palliative input  Chronic Low Back Pain: Patient has not been using her PRN Norco. - Continue home Norco q6 hours PRN - Added fentanyl patch for pain    Tobacco Abuse: Current smoker.  - Nicotine patch in place  History of DVT and PE:  - Continue chronic lovenox 100 mg BID  Diet: Regular  Dispo: Disposition is deferred at this time, awaiting improvement of current medical problems.     LOS: 19 days   Karlene Einstein, MD 04/01/2014, 10:28 AM

## 2014-04-01 NOTE — Progress Notes (Signed)
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 female on day 36 of her hospital stay for hypoxic respiratory failure due to ideopathic interstitial lung disease. Patient's O2 sat has remained at baseline on 7.5L nasal canula (Oxymizer) - up from 6L yesterday. Patient was in good spirits during our talk yesterday afternoon after she spoke with Palliative care; however, this morning she is very down and depressed/emotional. She continues to worry about her prognosis, which the pulmonary team has told her is very poor, and seems to be struggling with loneliness, fear of dying alone, insurance issues with continued care and preparing things for her son. Yesterday, she decided with the Palliative Care physician to be changed back to DNR based on her wishes to not be put on a ventilator given a better understanding of her condition and prognosis. Will continue with Palliative care. A meeting has been scheduled for Monday with her care team and her son.   OBJECTIVE: Filed Vitals:   04/01/14 0300 04/01/14 0400 04/01/14 0851 04/01/14 1009  BP: 126/52 134/48 135/59   Pulse: 67 76 75   Temp:   97.6 F (36.4 C)   TempSrc:  Oral Oral   Resp: 20 24 14    Height:      Weight:      SpO2: 95% 88% 93% 89%     Intake/Output Summary (Last 24 hours) at 04/01/14 1118 Last data filed at 04/01/14 1017  Gross per 24 hour  Intake    483 ml  Output    925 ml  Net   -442 ml    Intake/Output last 3 shifts: I/O last 3 completed shifts: In: 243 [P.O.:240; I.V.:3] Out: 2325 [Urine:2325]  Medications: Scheduled Meds: . antiseptic oral rinse  7 mL Mouth Rinse BID  . benzonatate  100 mg Oral TID  . budesonide  0.25 mg Nebulization 4 times per day  . enoxaparin  100 mg Subcutaneous BID  . fentaNYL  12.5 mcg Transdermal Q72H  . ferrous sulfate  325 mg Oral TID WC  . furosemide  40 mg Intravenous Q12H  . gabapentin  100 mg Oral TID  . ipratropium-albuterol  3 mL Nebulization Q6H  . lamoTRIgine  200 mg Oral q morning - 10a  . LORazepam  1 mg Oral q morning - 10a  . LORazepam  2 mg Oral QHS  . methylPREDNISolone (SOLU-MEDROL) injection  125 mg Intravenous 3 times per day  . nicotine  14 mg Transdermal QHS  . pantoprazole  40 mg Oral Daily  . pravastatin  40 mg Oral Daily  . QUEtiapine  400 mg Oral QHS  . sertraline  250 mg Oral q morning - 10a  . sodium chloride  3 mL Intravenous Q12H  . vitamin C  500 mg Oral Daily   Continuous Infusions:  PRN Meds:.acetaminophen, albuterol, HYDROcodone-acetaminophen, LORazepam, polyethylene glycol, senna-docusate  Physical Exam: GEN: AAOx3, depressed/emotional, morbidly obese HEENT: MMM, EOMI, PERRLA, b/l sclera anicteric  CV: RRR, S1S2nl, no murmurs/rubs/gallops PULM: coarse, rhonchrous breathing bilaterally, no change  EXT: no cyanosis, clubbing or edema  NEURO:  no focal deficits  PSYCH: nl affect, nl speech   Labs: BMP Latest Ref Rng 03/31/2014 03/30/2014 03/29/2014  Glucose 70 - 99 mg/dL 141(H) 128(H) 188(H)  BUN 6 - 23 mg/dL 18 22 21   Creatinine 0.50 - 1.10 mg/dL 0.69 0.64 0.74  Sodium 135 - 145 mmol/L 140 138 138  Potassium 3.5 - 5.1 mmol/L 3.8 3.5 4.0  Chloride 96 - 112 mmol/L 95(L) 99 95(L)  CO2 19 - 32 mmol/L 38(H) 33(H) 36(H)  Calcium  8.4 - 10.5 mg/dL 8.3(L) 8.4 8.4   CBC Latest Ref Rng 03/30/2014 03/26/2014 03/24/2014  WBC 4.0 - 10.5 K/uL 11.7(H) 13.4(H) 12.7(H)  Hemoglobin 12.0 - 15.0 g/dL 9.9(L) 9.6(L) 9.2(L)  Hematocrit 36.0 - 46.0 % 34.2(L) 33.8(L) 32.3(L)  Platelets 150 - 400 K/uL 314 400 354   CBC    Component Value Date/Time   WBC 11.7* 03/30/2014 0535   WBC 5.5 10/19/2012 1248   RBC 4.34 03/30/2014 0535   RBC 4.85 10/19/2012 1248   RBC 2.92* 12/23/2011 0200   HGB 9.9* 03/30/2014 0535   HGB 14.1 10/19/2012 1248   HCT 34.2* 03/30/2014 0535   HCT 43.5 10/19/2012 1248   PLT 314 03/30/2014 0535   PLT 237 10/19/2012 1248   MCV 78.8 03/30/2014 0535   MCV 89.6 10/19/2012 1248   MCH 22.8* 03/30/2014 0535   MCH 29.0 10/19/2012 1248   MCHC 28.9* 03/30/2014 0535   MCHC 32.3 10/19/2012 1248   RDW 19.9* 03/30/2014 0535   RDW 15.7* 10/19/2012 1248   LYMPHSABS 0.9 03/21/2014 0320   LYMPHSABS 1.3 10/19/2012 1248   MONOABS 0.4 03/21/2014 0320   MONOABS 0.3 10/19/2012 1248   EOSABS 0.1 03/21/2014 0320   EOSABS 0.1 10/19/2012 1248   BASOSABS 0.0 03/21/2014 0320   BASOSABS 0.0 10/19/2012 1248   Imaging: Last chest xray done 03/31/2014. Diffuse, dense unchanged bilateral pulmonary infiltrates. No pleural effusion or pneumothorax. Stable cardiomegaly.   Medications, Vitals, Labs, and Images reviewed.   ASSESSMENT AND PLAN: 69 y.o. female with a PMH significant for pulmonary hypertension, OSA, OHS, history of DVT and PE, depression, anxiety and smoking who is on day 19 of hospital stay for hypoxic respiratory failure with idiopathic interstitial lung disease. No change seen on PCXR, O2 sats in the 90s on 7.5L Oxymizer nasal canula. Patient is emotional and depressed about diagnosis and progrnosis. A meeting with Palliative Care and her son has been scheduled for Monday to determine her goals of care and dispo.   Hypoxic respiratory failure: The differential remains broad for the cause of her ideopathic interstitial lung  disease and current symptoms. Possible diagnoses include but are not limited to hypersensitivity pneumonitis (possibly from Perry Fancier's lung due to prior exposure to pet bird - Dr. Tommy Medal suggested ordering a chlamydia, psittacosis antibody screen to r/o) - however, her lack of improvement on steroids makes this less likely, broncheolitis obliterans organizing pneumonia, pulmonary alveolar proteinosis, eosinophilic pathologies such as eosinophilic granuloma or acute pneumonia, and acute interstitial pneumonia. Patient is currently being treated for an atypical pneumonia with doxycycline 136m bid (she is currently on her 14th day of treatment) and increased steroid dose. Sjogren's A and B antibody  negative. Anti-scl antibody negative. CK and CKMB w/in normal limits. Aldolase, cyclic citrul peptide antibody IgG, hypersensitivity pneumonitis antibody pending. Crypto antigen negative, antiphospholipid syndrome negative except for phosphatydalserine IgM. Procalcitonin < 0.10. RF elevated (17.3). ESR decreased from 68 to 28. ANCA WNL. Mpo/pr-3 WNL. TSH WNL. Creatinine 0.64.  - discontinue IV doxycyline 164m bid (finished 14 day course today with minimal/no improvement of symptoms/CT) - Continue increased IV steroids - Solumedrol 126mq8 for 6 doses (from 4039mid); will expire today.   - added pantoprazole while on steroids  - Continue to diurese with Lasix 20m107m q12 as tolerated.  - maintain O2 stats >85% (expected transient desaturation) - continue nebs (duonebs q6 hrs, pulmicort q6 hrs and albuterol q2 hrs PRN) - Robitussin DM q6 hrs PRN - Continue Tessalon Perles TID - Continue tylenol 500 mg PRN q 6 hours - Needs Prevnar vaccine as outpatient - palliative care meeting with her and her son on Monday for goals of care/goals of dispo  - no longer pursuing goal of BAL  or biopsy given that it would not change treatment.   - repeat sputum culture pending  - repeat PCXR to monitor progression    Pulmonary Hypertension: Complicated by OSA, OHS. Patient refuses CPAP. Echo 2013 showed PAP 40-44. Investigating ILD. - Avoid hypoxia  Constipation and Hair Loss: TSH normal. Constipation may be secondary to opioids or iron supplementation. - Senokot-S BID PRN - Miralax daily  Chronic Iron Deficiency Anemia: Hemoglobin stable- 9.9. MCV 78.8. - Continue oral iron supplementation TID  Urge and Stress Incontinence: Macrobid has been stopped, as this has an association with pulmonary hypersensitivity. Patient had been on it for her chronic colonization because she was to receive a urodynamic study as an outpatient.  GERD:  - Continue home protonix 40 mg by mouth daily  Depression and Anxiety: Anxiety/depression increased today. Will speak with palliative care with her son to determine goals of care on Monday.  - Continue home Ativan 1 mg by mouth in the am and 2 mg by mouth at night - Continue home Lamictal, Seroquel and Zoloft  Tobacco Abuse: Current smoker. Nicotine patch in place.  Vitamin D Deficiency: 15 - Started ergo 50 U weekly  History of DVT and PE:  - Continue chronic lovenox 100 mg BID  Diet: Regular

## 2014-04-01 NOTE — Progress Notes (Signed)
Pt desat to low 80's, when I entered the room pt was mouth breathing. I applied venti mask at 55%. O2 sat improved and maintaining greater than 85%  Alessandra Grout, RN

## 2014-04-01 NOTE — Progress Notes (Signed)
Pt is refusing CPAP tonight. No CPAP in room. Made pt aware that if she changed her mind to please call for RT. RT will continue to monitor.

## 2014-04-01 NOTE — Progress Notes (Signed)
Name: [name] MRN: [id number] DOB: 1945-07-16    ADMISSION DATE:  03/13/2014 CONSULTATION DATE:  04/01/2014  REFERRING MD :  Eppie Gibson ([facility])  CHIEF COMPLAINT:  SOB  BRIEF PATIENT DESCRIPTION: 75 yowf  brought to ED 2/22 for SOB.  CXR revealed b/l infiltrates.  She was admitted and treated for CAP.  CT chest a few days later revealed progressive ILD On review of  imaging, ILD dates back to 2010. Seen by Clance in the past Was on macrodantin at admit  SIGNIFICANT EVENTS  2/22 - admit 2/29 - desaturated to 35, placed on NRB, PCCM consulted 3/7- on lower fio2 3/10 PCXR worsening  STUDIES:  CXR 2/22 >>> diffuse lung opacification superimposed on ILD. 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 GGO's in crazy paving pattern.  Numerous enlarged mediastinal and hilar lymph nodes.  SUBJECTIVE:   Hopeless affect/ not getting oob or using IS at bedside    VITAL SIGNS: Temp:  [97.6 F (36.4 C)-98.3 F (36.8 C)] 98.2 F (36.8 C) (03/12 1216) Pulse Rate:  [67-82] 80 (03/12 1216) Resp:  [14-24] 17 (03/12 1216) BP: (123-157)/(46-68) 157/57 mmHg (03/12 1216) SpO2:  [85 %-100 %] 89 % (03/12 1216) FiO2 (%):  [55 %] 55 % (03/12 0200)  Intake/Output Summary (Last 24 hours) at 04/01/14 1312 Last data filed at 04/01/14 1017  Gross per 24 hour  Intake    483 ml  Output    925 ml  Net   -442 ml   PHYSICAL EXAMINATION: General: Morbidly obese female, no distress Neuro: A&O x 3, non-focal anxious at times HEENT: [place]/AT. PERRL Cardiovascular: RRR s1 s2  Lungs: unchanged anterior clear,  coarse crackles posterior basal Abdomen: Obese, BS x 4, soft, NT/ND.  Musculoskeletal: No gross deformities edema unchanged Skin: Intact, warm, no rashes.   Recent Labs Lab 03/29/14 0325 03/30/14 0535 03/31/14 0316  NA 138 138 140  K 4.0 3.5 3.8  CL 95* 99 95*  CO2 36* 33* 38*  BUN 21 22 18    CREATININE 0.74 0.64 0.69  GLUCOSE 188* 128* 141*    Recent Labs Lab 03/26/14 0330 03/30/14 0535  HGB 9.6* 9.9*  HCT 33.8* 34.2*  WBC 13.4* 11.7*  PLT 400 314   Dg Chest Port 1 View  03/31/2014   CLINICAL DATA:  Interstitial lung disease.  EXAM: PORTABLE CHEST - 1 VIEW  COMPARISON:  03/30/2014 .  03/22/2014.  01/21/2013.  FINDINGS: Stable cardiomegaly. Diffuse dense bilateral pulmonary pulmonary infiltrates. No pleural effusion or pneumothorax. Prior cervical spine fusion.  IMPRESSION: Diffuse dense unchanged bilateral pulmonary infiltrates.   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  Mediastinal and hilar lymphadenopathy - chronic dating back to 2011. Radiology not typical for IPF > Minimal response to steroids    Lab Results  Component Value Date   ESRSEDRATE 28* 03/28/2014   ESRSEDRATE 68* 03/20/2014   ESRSEDRATE 6 06/01/2007     Recs: DuoNebs, Albuterol  ct high flow O2 nasal cannula reservoir  will treat as IPF flare with high-dose Solu-Medrol 125 every 86 doses    ? New HCAP - Doubtful , note low pro-calcitonin Recs: Culture remains neg  OSA / probable OHS Recs:  Continue nocturnal CPAP  Pulmonary Hypertension (echo from 2013, PAP 40-44) - Class 3 likely due to chronic lung disease and OSA. 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

## 2014-04-02 DIAGNOSIS — J9620 Acute and chronic respiratory failure, unspecified whether with hypoxia or hypercapnia: Secondary | ICD-10-CM

## 2014-04-02 DIAGNOSIS — G8929 Other chronic pain: Secondary | ICD-10-CM

## 2014-04-02 DIAGNOSIS — R591 Generalized enlarged lymph nodes: Secondary | ICD-10-CM

## 2014-04-02 DIAGNOSIS — F418 Other specified anxiety disorders: Secondary | ICD-10-CM

## 2014-04-02 DIAGNOSIS — D509 Iron deficiency anemia, unspecified: Secondary | ICD-10-CM

## 2014-04-02 DIAGNOSIS — N3946 Mixed incontinence: Secondary | ICD-10-CM

## 2014-04-02 DIAGNOSIS — K219 Gastro-esophageal reflux disease without esophagitis: Secondary | ICD-10-CM

## 2014-04-02 DIAGNOSIS — K5900 Constipation, unspecified: Secondary | ICD-10-CM

## 2014-04-02 DIAGNOSIS — F329 Major depressive disorder, single episode, unspecified: Secondary | ICD-10-CM

## 2014-04-02 DIAGNOSIS — M545 Low back pain: Secondary | ICD-10-CM

## 2014-04-02 DIAGNOSIS — L659 Nonscarring hair loss, unspecified: Secondary | ICD-10-CM

## 2014-04-02 DIAGNOSIS — Z720 Tobacco use: Secondary | ICD-10-CM

## 2014-04-02 DIAGNOSIS — A70 Chlamydia psittaci infections: Secondary | ICD-10-CM | POA: Insufficient documentation

## 2014-04-02 LAB — CBC
HEMATOCRIT: 38.4 % (ref 36.0–46.0)
Hemoglobin: 11 g/dL — ABNORMAL LOW (ref 12.0–15.0)
MCH: 22.8 pg — ABNORMAL LOW (ref 26.0–34.0)
MCHC: 28.6 g/dL — ABNORMAL LOW (ref 30.0–36.0)
MCV: 79.5 fL (ref 78.0–100.0)
PLATELETS: 308 10*3/uL (ref 150–400)
RBC: 4.83 MIL/uL (ref 3.87–5.11)
RDW: 20.4 % — AB (ref 11.5–15.5)
WBC: 13.1 10*3/uL — ABNORMAL HIGH (ref 4.0–10.5)

## 2014-04-02 MED ORDER — ENOXAPARIN SODIUM 150 MG/ML ~~LOC~~ SOLN
150.0000 mg | Freq: Two times a day (BID) | SUBCUTANEOUS | Status: DC
  Administered 2014-04-02 – 2014-04-03 (×3): 150 mg via SUBCUTANEOUS
  Filled 2014-04-02 (×4): qty 1

## 2014-04-02 MED ORDER — DOXYCYCLINE HYCLATE 100 MG PO TABS
100.0000 mg | ORAL_TABLET | Freq: Two times a day (BID) | ORAL | Status: DC
  Administered 2014-04-02 – 2014-04-06 (×9): 100 mg via ORAL
  Filled 2014-04-02 (×9): qty 1

## 2014-04-02 MED ORDER — SENNOSIDES-DOCUSATE SODIUM 8.6-50 MG PO TABS
2.0000 | ORAL_TABLET | Freq: Two times a day (BID) | ORAL | Status: DC
  Administered 2014-04-02 – 2014-04-06 (×7): 2 via ORAL
  Filled 2014-04-02 (×8): qty 2

## 2014-04-02 MED ORDER — METHYLPREDNISOLONE SODIUM SUCC 125 MG IJ SOLR
80.0000 mg | Freq: Two times a day (BID) | INTRAMUSCULAR | Status: DC
Start: 2014-04-02 — End: 2014-04-06
  Administered 2014-04-02 – 2014-04-05 (×8): 80 mg via INTRAVENOUS
  Filled 2014-04-02: qty 1.28
  Filled 2014-04-02 (×5): qty 2
  Filled 2014-04-02: qty 1.28
  Filled 2014-04-02 (×2): qty 2

## 2014-04-02 NOTE — Progress Notes (Addendum)
Name: SULAMITA LAFOUNTAIN MRN: [id number] DOB: 09/03/1945    ADMISSION DATE:  03/13/2014 CONSULTATION DATE:  04/02/2014  REFERRING MD :  Eppie Gibson ([facility])  CHIEF COMPLAINT:  SOB  BRIEF PATIENT DESCRIPTION: 75 yowf  brought to ED 2/22 for SOB.  CXR revealed b/l infiltrates.  She was admitted and treated for CAP.  CT chest a few days later revealed progressive ILD On review of  imaging, ILD dates back to 2010. Seen by Clance in the past Was on macrodantin at admit and d/c'd 2/29  SIGNIFICANT EVENTS  2/22 - admit 2/29 - desaturated to 35, placed on NRB, PCCM consulted 3/7- on lower fio2 3/10 PCXR worsening 3/11 started High dose solumedrol x 6 doses 3/13 reduced solumedrol to 80 q 12  STUDIES:  CXR 2/22 >>> diffuse lung opacification superimposed on ILD. 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 GGO's in crazy paving pattern.  Numerous enlarged mediastinal and hilar lymph nodes.  SUBJECTIVE:   Hopeless affect/ not getting oob or using IS at bedside   Bad heart "have it all the time "   Rx per [facility] On 8lpm 02   VITAL SIGNS: Temp:  [97.8 F (36.6 C)-98.4 F (36.9 C)] 97.8 F (36.6 C) (03/13 0912) Pulse Rate:  [62-85] 68 (03/13 0912) Resp:  [12-19] 14 (03/13 0912) BP: (91-157)/(48-79) 106/48 mmHg (03/13 0912) SpO2:  [85 %-96 %] 89 % (03/13 0851) Weight:  [309 lb 1.4 oz (140.2 kg)] 309 lb 1.4 oz (140.2 kg) (03/13 0402)  Intake/Output Summary (Last 24 hours) at 04/02/14 1200 Last data filed at 04/02/14 1038  Gross per 24 hour  Intake    363 ml  Output   1725 ml  Net  -1362 ml   PHYSICAL EXAMINATION: General: Morbidly obese female, no distress Neuro: A&O x 3, non-focal anxious at times HEENT: [place]/AT. PERRL Cardiovascular: RRR s1 s2  Lungs: unchanged anterior clear,  coarse crackles posterior basal Abdomen: Obese, BS x 4, soft, NT/ND.  Musculoskeletal: No gross deformities  edema unchanged Skin: Intact, warm, no rashes.   Recent Labs Lab 03/29/14 0325 03/30/14 0535 03/31/14 0316  NA 138 138 140  K 4.0 3.5 3.8  CL 95* 99 95*  CO2 36* 33* 38*  BUN 21 22 18   CREATININE 0.74 0.64 0.69  GLUCOSE 188* 128* 141*    Recent Labs Lab 03/30/14 0535 04/02/14 0450  HGB 9.9* 11.0*  HCT 34.2* 38.4  WBC 11.7* 13.1*  PLT 314 308   No results found.    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 Radiology not typical for IPF > Minimal response to steroids this admit  > Macrodantin d/c 03/20/14   Lab Results  Component Value Date   ESRSEDRATE 28* 03/28/2014   ESRSEDRATE 68* 03/20/2014   ESRSEDRATE 6 06/01/2007     Recs: DuoNebs, Albuterol    high flow O2 nasal cannula reservoir  will treat as IPF flare but reduce solumedrol to 80 q 12 as of 3/13     ? New HCAP - Doubtful , note low pro-calcitonin Recs: Culture remains neg  OSA / probable OHS Recs: Continue nocturnal CPAP  Pulmonary Hypertension (echo from 2013, PAP 40-44) -  Class 3 likely due to chronic lung disease and OSA. 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

## 2014-04-02 NOTE — Progress Notes (Signed)
  Date: 04/02/2014  Patient name: [name]  Medical record number: [id number]  Date of birth: April 20, 1945   This patient's plan of care was discussed with the house staff. Please see their note for complete details. I concur with their findings.    Exam:  Pt  Less  sad today, dysphoric, also c/o dyspnea CV: RRR, , no mgr Pulm: wheezes posteriorly GI: nd,  Neuro: nonfocal  A/P  #1 Acute on Chronic Respiratory Failure: Seems most likely a NSIP with progressive IPF vs flare to new irritant such as macrobid, or environmental +  Greatly appreciate CCM, Palliative care help  Agree with high dose steroids which we will continue though little improvement .  She is anxious to have meeting on Monday and to see Dr. Lysbeth Galas again  She is also being rx for chlamydia psittaci infection given hx of having a pet pigeon (though her pigeon was not known to be ill)  While I HAVE SIGNIFICANT DOUBTS FOR PSITTACOSIS, I would go ahead and keep her on DOXY for another 7 days, just in case of this remote possibility and given that doxy not likely to cause harm  I will also check serologies for C psittaci  Again not high yield but pt and family have already "gotten rid of her pet" and it may be helpful if we have negative serologies to have an definitive answer to what this is NOT if she continues to not respond and if Psittaci antibodies more than 2 weeks into rx are negative    #2 Goals of care: to have meeting on Monday with family to discuss.  #3 and anxiety continue anxiolytics.   Truman Hayward, MD 04/02/2014, 11:45 AM

## 2014-04-02 NOTE — Progress Notes (Signed)
ANTICOAGULATION CONSULT NOTE - Initial Consult  Pharmacy Consult for Lovenox Indication: Hx of DVT and PE  Allergies  Allergen Reactions  . Adhesive [Tape] Other (See Comments)    "I break out all over"; Can only use paper tape  . Codeine Other (See Comments)    "first dose I took, my aide found me the next day laying on the floor"  . Morphine Other (See Comments)    caused change in mental status, she does not want to try it again.  . Sulfonamide Derivatives Nausea And Vomiting  . Thorazine [Chlorpromazine] Nausea Only    Patient Measurements: Height: 5\' 7"  (170.2 cm) Weight: (!) 309 lb 1.4 oz (140.2 kg) IBW/kg (Calculated) : 61.6  Vital Signs: Temp: 97.8 F (36.6 C) (03/13 0912) Temp Source: Oral (03/13 0912) BP: 106/48 mmHg (03/13 0912) Pulse Rate: 68 (03/13 0912)  Labs:  Recent Labs  03/31/14 0316 04/02/14 0450  HGB  --  11.0*  HCT  --  38.4  PLT  --  308  CREATININE 0.69  --     Estimated Creatinine Clearance: 97.4 mL/min (by C-G formula based on Cr of 0.69).   Medical History: Past Medical History  Diagnosis Date  . Chronic low back pain 11/05/2005    s/p lumbar stenosis decompression surgery with residual pain  . Arthritis of left knee 01/30/2012  . Fibromyalgia 01/30/2012  . 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  On life-long anticoagulation.   . Chronic venous insufficiency 01/30/2012    Left lower extremity s/p DVT   . Morbid obesity with BMI of 40.0-44.9, adult 01/02/2011  . Gastroesophageal reflux disease 01/30/2012  . 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 Benson Norway  . Major depression 11/05/2005  . Anxiety 01/30/2012  . Hyperlipidemia LDL goal < 130 01/30/2012  . Urge urinary incontinence 03/25/2008    Urge predominant, minor component or stress.  Nocturnal.   . Obstructive sleep apnea 11/05/2005    Non-compliant with nocturnal nasal CPAP secondary to it  being "ineffective"   . Tobacco abuse 11/05/2005  . Irritable bowel syndrome   . Interstitial cystitis     followed by Dr. Risa Grill  . Left cataract     Surgery pending  . Bell's palsy 1980's  . History of blood transfusion     During GAVE associated GI bleed  . Complication of anesthesia     Hard time putting patient to sleep  . Family history of anesthesia complication     "Mama couldn't wake up easy"  . Asymptomatic cholelithiasis 07/15/2013  . 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 Breast cancer     Bilateral s/p mastectomies    Assessment: 69yo female on chronic Lovenox for h/o DVT and PE.  PTA dose of 100mg  Q12h is a bit odd considering Wt ~150kg.  CBC trend up, PLT WNL, CrCl 97 mL/min, BMI 48. Discussed w/ Dr. Denton Brick who agrees that Lovenox dose should be increased for more appropriate dosing.  Plan:  Recommend increasing Lovenox to 150mg  subcut Q12h Monitor CBC, s/sx of bleeding  Drucie Opitz, PharmD Clinical Pharmacy Resident Pager: [phone] 04/02/2014 9:46 AM

## 2014-04-02 NOTE — Progress Notes (Signed)
Subjective: Patient says foley had problems overnight, but denies pain due to presence of cathether. Presently on O2- oximizer saturating 90-95 % while in room with patient.   Objective: Vital signs in last 24 hours: Filed Vitals:   04/02/14 0159 04/02/14 0402 04/02/14 0500 04/02/14 0600  BP: 128/57 120/54 114/79 108/50  Pulse: 64 65 79 62  Temp:  98 F (36.7 C)    TempSrc:  Axillary    Resp: 13 15 19 15   Height:      Weight:  309 lb 1.4 oz (140.2 kg)    SpO2: 95% 92% 92% 95%   Weight change:   Intake/Output Summary (Last 24 hours) at 04/02/14 0847 Last data filed at 04/02/14 0400  Gross per 24 hour  Intake    603 ml  Output   1725 ml  Net  -1122 ml   Physical Exam: Appearance: Morbidly obese, sleeping, not in any distress. HEENT: AT/[place],  Heart: RRR,  Lungs: moving equal vols of air bilat. Abdomen: BS+, obese, Extremities: No BLE edema, no tenderness Neurologic: Answered questions appropriately.  Lab Results: Basic Metabolic Panel:  Recent Labs Lab 03/28/14 0407  03/30/14 0535 03/31/14 0316  NA 139  < > 138 140  K 4.4  < > 3.5 3.8  CL 95*  < > 99 95*  CO2 39*  < > 33* 38*  GLUCOSE 202*  < > 128* 141*  BUN 18  < > 22 18  CREATININE 0.71  < > 0.64 0.69  CALCIUM 8.8  < > 8.4 8.3*  MG 2.2  --  2.3  --   PHOS 4.7*  --  3.5  --   < > = values in this interval not displayed. CBC:  Recent Labs Lab 03/30/14 0535 04/02/14 0450  WBC 11.7* 13.1*  HGB 9.9* 11.0*  HCT 34.2* 38.4  MCV 78.8 79.5  PLT 314 308   Medications: I have reviewed the patient's current medications. Scheduled Meds: . antiseptic oral rinse  7 mL Mouth Rinse BID  . benzonatate  100 mg Oral TID  . budesonide  0.25 mg Nebulization 4 times per day  . enoxaparin  100 mg Subcutaneous BID  . fentaNYL  12.5 mcg Transdermal Q72H  . ferrous sulfate  325 mg Oral TID WC  . furosemide  40 mg Intravenous Q12H  . gabapentin  100 mg Oral TID  . ipratropium-albuterol  3 mL Nebulization Q6H  .  lamoTRIgine  200 mg Oral q morning - 10a  . LORazepam  1 mg Oral q morning - 10a  . LORazepam  2 mg Oral QHS  . nicotine  14 mg Transdermal QHS  . pantoprazole  40 mg Oral BID AC  . pravastatin  40 mg Oral Daily  . QUEtiapine  400 mg Oral QHS  . sertraline  250 mg Oral q morning - 10a  . sodium chloride  3 mL Intravenous Q12H  . vitamin C  500 mg Oral Daily   Assessment/Plan:  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 Her bronchoscopy has been postponed and pulmonology believes her prognosis to be very poor. She is very emotional about her current condition. A follow-up Palliative Care family meeting has been scheduled for 3/14.  Acute on Chronic Hypoxic Respiratory Failure: Working diagnosis- IPF, per PCCM, radiographic findings not typical. Differential for causes of her interstitial lung disease  and exacerbating factors remains broad with considerations for hypersensitivity pneumonitis- Exposure to birds and macrobid.  - Appreciate PCCM recommendations - Completed Increased IV steroids yesterday to 125 mg q8 hours for 6 doses (from 40 mg BID);  - Added pantoprazole while on steroids - D/c IV Doxycycline (started 2/28-->finished 14 day course today) - Resume lasix 40 mg IV q 12 hours, Cr holding steady, Co2- 38, Net neg- -3.3L - Continue to wean off oxygen as tolerated - Continue nebs (duonebs q6 hours, pulmicort q6 hours and albuterol q2 hours PRN) - Robitussin-DM q6 hours PRN - Continue Tessalon Perles TID - Continue tylenol 500 mg PRN q 6 hours - Needs Prevnar vaccine as outpatient - PT and OT- recs- SNF, palliative care helping to establish dispo goals, family meeting 3/14 - Repeat sputum culture pending (sample needs to be collected)  Mediastinal and Hilar Lymphadenopathy: ACE 95 in 2009-->50 in 02/2014. Not a candidate for biopsy.  Pulmonary Hypertension:  Complicated by OSA, OHS. Patient refuses CPAP. Echo 2013 showed PAP 40-44. Investigating ILD. - Avoid hypoxia  Constipation and Hair Loss: TSH normal. Constipation may be secondary to opioids or iron supplementation. - Senokot-S BID PRN - Miralax daily  Chronic Iron Deficiency Anemia: Hemoglobin stable- 9.6. MCV 79.0. - Continue oral iron supplementation TID  Urge and Stress Incontinence: Macrobid has been stopped, as this has an association with pulmonary hypersensitivity. Patient had been on it for her chronic colonization because she was to receive a urodynamic study as an outpatient.  GERD:  - Continue home protonix 40 mg by mouth daily  Depression and Anxiety: Anxiety about health has been present on this admission. - Continue home Ativan 1 mg by mouth in the am and 2 mg by mouth at night; added ativan 1 mg in the morning and 1 mg PRN for anxiety - Continue home Lamictal, Seroquel - Increased home Zoloft dosing 250 mg daily - Continue discussions about goals of care, wishes for dispo, appreciate Palliative input  Chronic Low Back Pain: Patient has not been using her PRN Norco. - Continue home Norco q6 hours PRN - Added fentanyl patch for pain    Tobacco Abuse: Current smoker.  - Nicotine patch in place  History of DVT and PE:  - Continue chronic lovenox 100 mg BID  Diet: Regular  Dispo: Disposition is deferred at this time, awaiting improvement of current medical problems.     LOS: 20 days   Jennifer Roys, MD 04/02/2014, 8:47 AM

## 2014-04-02 NOTE — Progress Notes (Signed)
Pt states that she is hard painful bowel movements.  Gave pt both senna and miralax with some relief. Notified MD, requested bowel regime to be set up for pt due to pain medications and iron usage.

## 2014-04-03 DIAGNOSIS — I82402 Acute embolism and thrombosis of unspecified deep veins of left lower extremity: Secondary | ICD-10-CM

## 2014-04-03 DIAGNOSIS — J849 Interstitial pulmonary disease, unspecified: Secondary | ICD-10-CM

## 2014-04-03 LAB — MISCELLANEOUS TEST

## 2014-04-03 MED ORDER — SUCRALFATE 1 G PO TABS
1.0000 g | ORAL_TABLET | Freq: Two times a day (BID) | ORAL | Status: DC
Start: 2014-04-03 — End: 2014-04-06
  Administered 2014-04-03 – 2014-04-06 (×7): 1 g via ORAL
  Filled 2014-04-03 (×7): qty 1

## 2014-04-03 MED ORDER — ENOXAPARIN SODIUM 150 MG/ML ~~LOC~~ SOLN
140.0000 mg | Freq: Two times a day (BID) | SUBCUTANEOUS | Status: DC
  Administered 2014-04-03 – 2014-04-04 (×3): 140 mg via SUBCUTANEOUS
  Filled 2014-04-03 (×6): qty 1

## 2014-04-03 NOTE — Progress Notes (Signed)
Subjective: [name] is very emotional today. She feels like everyone is giving up on her. She is unsure whether her son can "take this". She does not like that her doctors keep changing. She would like to know what the "average length of life of a lung" is.   Interval Events: - Patient to have Family Meeting with Palliative Care today  Objective: Vital signs in last 24 hours: Filed Vitals:   04/03/14 0000 04/03/14 0100 04/03/14 0350 04/03/14 0418  BP: 139/63 128/50  116/50  Pulse: 64 71  92  Temp:    98 F (36.7 C)  TempSrc:    Oral  Resp: 13 17  15   Height:      Weight:    307 lb 1.6 oz (139.3 kg)  SpO2: 96% 92% 81% 91%   Weight change: -1 lb 15.8 oz (-0.9 kg)  Intake/Output Summary (Last 24 hours) at 04/03/14 0723 Last data filed at 04/03/14 0424  Gross per 24 hour  Intake    243 ml  Output   2350 ml  Net  -2107 ml   Physical Exam: Appearance: Morbidly obese, lying in bed in NAD on 10 L O2 [place] through oxymizer HEENT: AT/[place], PERRL, EOMi Heart: RRR, no murmurs Lungs: continues to have coarse, rhonchorous breathing b/l, but breathing comfortably and moving equal volumes of air, saturations drop when patient is anxious / upset Abdomen: BS+, obese, soft, nontender Extremities: No BLE edema, no tenderness Neurologic: A&Ox3  Lab Results: Basic Metabolic Panel:  Recent Labs Lab 03/28/14 0407  03/30/14 0535 03/31/14 0316  NA 139  < > 138 140  K 4.4  < > 3.5 3.8  CL 95*  < > 99 95*  CO2 39*  < > 33* 38*  GLUCOSE 202*  < > 128* 141*  BUN 18  < > 22 18  CREATININE 0.71  < > 0.64 0.69  CALCIUM 8.8  < > 8.4 8.3*  MG 2.2  --  2.3  --   PHOS 4.7*  --  3.5  --   < > = values in this interval not displayed. CBC:  Recent Labs Lab 03/30/14 0535 04/02/14 0450  WBC 11.7* 13.1*  HGB 9.9* 11.0*  HCT 34.2* 38.4  MCV 78.8 79.5  PLT 314 308   Medications: I have reviewed the patient's current medications. Scheduled Meds: . antiseptic oral rinse  7 mL Mouth Rinse  BID  . benzonatate  100 mg Oral TID  . budesonide  0.25 mg Nebulization 4 times per day  . doxycycline  100 mg Oral Q12H  . enoxaparin (LOVENOX) injection  150 mg Subcutaneous Q12H  . fentaNYL  12.5 mcg Transdermal Q72H  . ferrous sulfate  325 mg Oral TID WC  . furosemide  40 mg Intravenous Q12H  . gabapentin  100 mg Oral TID  . ipratropium-albuterol  3 mL Nebulization Q6H  . lamoTRIgine  200 mg Oral q morning - 10a  . LORazepam  1 mg Oral q morning - 10a  . LORazepam  2 mg Oral QHS  . methylPREDNISolone (SOLU-MEDROL) injection  80 mg Intravenous Q12H  . nicotine  14 mg Transdermal QHS  . pantoprazole  40 mg Oral BID AC  . pravastatin  40 mg Oral Daily  . QUEtiapine  400 mg Oral QHS  . senna-docusate  2 tablet Oral BID  . sertraline  250 mg Oral q morning - 10a  . sodium chloride  3 mL Intravenous Q12H  . vitamin C  500 mg Oral Daily   Assessment/Plan:  Ms. 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 Her bronchoscopy has been postponed and pulmonology believes her prognosis to be very poor. She is very emotional about her current condition. A follow-up Palliative Care family meeting has been scheduled for today.  Acute on Chronic Hypoxic Respiratory Failure: Working diagnosis- IPF, per PCCM, radiographic findings not typical. Differential for causes of her interstitial lung disease and exacerbating factors remains broad with considerations for hypersensitivity pneumonitis- exposure to dove and macrobid.  - Palliative Care family meeting today; appreciate Palliative guidance - Appreciate PCCM recommendations - IV steroids decreased yesterday to 80 mg q12 hours --> will be transitioned to 50 mg PO daily on 3/17 - Continue pantoprazole while on steroids - Extend IV Doxycycline for extra 7 days, will be a total of 21 (started 2/28-->continue through 3/19) - Resume lasix  40 mg IV q 12 hours, Cr stable at 0.69, Co2- 38, cumulative net neg is -9.2L - Continue to wean off oxygen as tolerated - Continue nebs (duonebs q6 hours, pulmicort q6 hours and albuterol q2 hours PRN) - Robitussin-DM q6 hours PRN - Continue Tessalon Perles TID - Continue tylenol 500 mg PRN q 6 hours - Needs Prevnar vaccine as outpatient - PT and OT- recs- SNF, palliative care helping to establish dispo goals, family meeting today - Repeat sputum culture pending (sample needs to be collected)  Mediastinal and Hilar Lymphadenopathy: ACE 95 in 2009-->50 in 02/2014. Not a candidate for biopsy.  Pulmonary Hypertension: Complicated by OSA, OHS. Patient refuses CPAP. Echo 2013 showed PAP 40-44. Investigating ILD. - Avoid hypoxia  Constipation and Hair Loss: TSH normal. Constipation may be secondary to opioids or iron supplementation. - Senokot-S 2 tablets BID PRN - Miralax daily PRN  Chronic Iron Deficiency Anemia: Hemoglobin stable- 9.6. MCV 79.0. - Continue oral iron supplementation TID  Urge and Stress Incontinence: Macrobid has been stopped, as this has an association with pulmonary hypersensitivity. Patient had been on it for her chronic colonization because she was to receive a urodynamic study as an outpatient.  GERD:  - Continue home protonix 40 mg by mouth daily  Depression and Anxiety: Anxiety about health, son and isolation has been present on this admission. - Continue home Ativan 1 mg by mouth in the am and 2 mg by mouth at night; added ativan 1 mg in the morning and 1 mg PRN for anxiety - Continue home Lamictal, Seroquel - Increased home Zoloft dosing 250 mg daily - Continue discussions about goals of care, wishes for dispo, appreciate Palliative input  Chronic Low Back Pain: Patient has not been using her PRN Norco. - Continue home Norco q6 hours PRN - Added fentanyl patch for pain    Tobacco Abuse: Current smoker.  - Nicotine patch in place  History of DVT and PE:  -  Continue chronic lovenox 100 mg BID  Diet: Regular  Dispo: Disposition is deferred at this time, awaiting improvement of current medical problems.     LOS: 21 days   Karlene Einstein, MD 04/03/2014, 7:23 AM

## 2014-04-03 NOTE — Progress Notes (Signed)
Name: DIAMOND JENTZ MRN: [id number] DOB: 22-Sep-1945    ADMISSION DATE:  03/13/2014 CONSULTATION DATE:  04/03/2014  REFERRING MD :  Eppie Gibson ([facility])  CHIEF COMPLAINT:  SOB  BRIEF PATIENT DESCRIPTION: 44 yowf  brought to ED 2/22 for SOB.  CXR revealed b/l infiltrates.  She was admitted and treated for CAP.  CT chest a few days later revealed progressive ILD On review of  imaging, ILD dates back to 2010. Seen by [facility] in the past Was on macrodantin at admit and d/c'd 2/29  SIGNIFICANT EVENTS  2/22 - admit 2/29 - desaturated to 35, placed on NRB, PCCM consulted 3/7- on lower fio2 3/10 PCXR worsening 3/11 started High dose solumedrol x 6 doses 3/13 reduced solumedrol to 80 q 12  STUDIES:  CXR 2/22 >>> diffuse lung opacification superimposed on ILD. 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 GGO's in crazy paving pattern.  Numerous enlarged mediastinal and hilar lymph nodes.  SUBJECTIVE:   Hopeless affect/ not getting oob or using IS at bedside   Bad heart "have it all the time "   Rx per [facility] On 8lpm 02   VITAL SIGNS: Temp:  [97.3 F (36.3 C)-98.5 F (36.9 C)] 98.2 F (36.8 C) (03/14 0737) Pulse Rate:  [55-92] 64 (03/14 0818) Resp:  [13-24] 24 (03/14 0818) BP: (116-150)/(50-94) 119/57 mmHg (03/14 0737) SpO2:  [81 %-98 %] 95 % (03/14 0818) Weight:  [139.3 kg (307 lb 1.6 oz)] 139.3 kg (307 lb 1.6 oz) (03/14 0418)  Intake/Output Summary (Last 24 hours) at 04/03/14 1100 Last data filed at 04/03/14 1018  Gross per 24 hour  Intake    418 ml  Output   2580 ml  Net  -2162 ml   PHYSICAL EXAMINATION: General: Morbidly obese female, no distress Neuro: A&O x 3, non-focal anxious at times HEENT: [place]/AT, PERRL, EOM-I and +BS. Cardiovascular: RRR, Nl S1/S2, -M/R/G Lungs: Diffuse crackles, distant BS Abdomen: Obese, BS x 4, soft, NT/ND.  Musculoskeletal: No gross deformities edema  unchanged Skin: Intact, warm, no rashes.  Recent Labs Lab 03/29/14 0325 03/30/14 0535 03/31/14 0316  NA 138 138 140  K 4.0 3.5 3.8  CL 95* 99 95*  CO2 36* 33* 38*  BUN 21 22 18   CREATININE 0.74 0.64 0.69  GLUCOSE 188* 128* 141*   Recent Labs Lab 03/30/14 0535 04/02/14 0450  HGB 9.9* 11.0*  HCT 34.2* 38.4  WBC 11.7* 13.1*  PLT 314 308   No results found.    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 Radiology not typical for IPF > Minimal response to steroids this admit  > Macrodantin d/c 03/20/14   Lab Results  Component Value Date   ESRSEDRATE 28* 03/28/2014   ESRSEDRATE 68* 03/20/2014   ESRSEDRATE 6 06/01/2007     Recs: DuoNebs, Albuterol  High flow O2 nasal cannula reservoir  Continue solumedrol at 80 q12 for 3 more days then change to prednisone 50 mg PO daily.  ? New HCAP - Doubtful , note low pro-calcitonin Recs: Culture remains neg Continue doxycycline per primary team, recommend treating for a total of 8 days.  OSA / probable OHS Recs: CPAP while asleep.  Pulmonary  Hypertension (echo from 2013, PAP 40-44) - Class 3 likely due to chronic lung disease and OSA. 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  Rush Farmer, M.D. [facility] Pulmonary/Critical Care Medicine. Pager: [phone]. After hours pager: [phone].

## 2014-04-03 NOTE — Progress Notes (Signed)
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   Morbid obesity with BMI of 50.0-59.9, adult   Gastric antral vascular ectasia   Hyperlipidemia LDL goal <130   Anxiety   Gastroesophageal reflux disease   Osteoporosis   CAP (community acquired pneumonia)   Anemia, iron deficiency   Pneumonitis, hypersensitivity, avian   Pneumonitis   Vitamin D deficiency   Absolute anemia   Pulmonary infiltrates   Respiratory failure   Dyspnea   ILD (interstitial lung disease)   Psittacosis   Length of Stay (days):21   SUBJECTIVE/24 HOUR EVENTS: 69 y.o. female on day 85 of her hospital stay for hypoxic respiratory failure due to ideopathic interstitial lung disease. Patient's O2 sat has remained at baseline with occasional desats to lower 80s at night time (on 10L nasal canula (Oxymizer)). Patient was very emotional and upset during our visit this morning - she is worried about the meeting with Dr. Hilma Favors and [place] today and whether or not her son will be able to make it. She is also still very emotional and unsure about her prognosis and has a bleak outlook on the future. She seems upset with how exhausted she feels and stated that she just felt like her body was giving out. She has been having some issues with her folly catheter and some pain with bowel movements due to hard stool. She also complained of increased acid reflux causing her significant discomfort - she states drinking milk helps.   OBJECTIVE: Filed Vitals:   04/03/14 0737 04/03/14 0816 04/03/14 0818 04/03/14 1100  BP: 119/57   128/59  Pulse: 63  64 67  Temp: 98.2 F (36.8 C)   97.6 F (36.4 C)  TempSrc: Oral   Oral  Resp: 14  24 14   Height:      Weight:      SpO2: 95% 94% 95% 90%    Intake/Output  Summary (Last 24 hours) at 04/03/14 1240 Last data filed at 04/03/14 1018  Gross per 24 hour  Intake    418 ml  Output   2580 ml  Net  -2162 ml    Intake/Output last 3 shifts: I/O last 3 completed shifts: In: 74 [P.O.:600; I.V.:3] Out: 3175 [Urine:3175]  Medications: Scheduled Meds: . antiseptic oral rinse  7 mL Mouth Rinse BID  . benzonatate  100 mg Oral TID  . budesonide  0.25 mg Nebulization 4 times per day  . doxycycline  100 mg Oral Q12H  . enoxaparin (LOVENOX) injection  150 mg Subcutaneous Q12H  . fentaNYL  12.5 mcg Transdermal Q72H  . ferrous sulfate  325 mg Oral TID WC  . furosemide  40 mg Intravenous Q12H  . gabapentin  100 mg Oral TID  . ipratropium-albuterol  3 mL Nebulization Q6H  . lamoTRIgine  200 mg Oral q morning - 10a  . LORazepam  1 mg Oral q morning - 10a  . LORazepam  2 mg Oral QHS  . methylPREDNISolone (SOLU-MEDROL) injection  80 mg Intravenous Q12H  . nicotine  14 mg Transdermal QHS  . pantoprazole  40 mg Oral BID AC  . pravastatin  40 mg Oral Daily  . QUEtiapine  400 mg Oral QHS  . senna-docusate  2 tablet Oral BID  . sertraline  250  mg Oral q morning - 10a  . sodium chloride  3 mL Intravenous Q12H  . vitamin C  500 mg Oral Daily   Continuous Infusions:  PRN Meds:.acetaminophen, albuterol, HYDROcodone-acetaminophen, LORazepam, polyethylene glycol  Physical Exam: GEN: AAOx3, depressed/emotional, morbidly obese  HEENT: MMM, EOMI, PERRLA, b/l sclera anicteric, no conjunctival injection CV: RRR, S1S2nl, no murmurs/rubs/gallops PULM: coarse, rhonchrous breathing bilaterally, no change  ABD: soft, ND/NT, no rebound, no guarding, normal/active bowel sounds  EXT: no cyanosis, clubbing or edema  NEURO: no focal deficits  PSYCH: nl affect, nl speech   Labs: BMP Latest Ref Rng 03/31/2014 03/30/2014 03/29/2014  Glucose 70 - 99 mg/dL 141(H) 128(H) 188(H)  BUN 6 - 23 mg/dL 18 22 21   Creatinine 0.50 - 1.10 mg/dL 0.69 0.64 0.74  Sodium 135 - 145 mmol/L  140 138 138  Potassium 3.5 - 5.1 mmol/L 3.8 3.5 4.0  Chloride 96 - 112 mmol/L 95(L) 99 95(L)  CO2 19 - 32 mmol/L 38(H) 33(H) 36(H)  Calcium 8.4 - 10.5 mg/dL 8.3(L) 8.4 8.4   CBC Latest Ref Rng 04/02/2014 03/30/2014 03/26/2014  WBC 4.0 - 10.5 K/uL 13.1(H) 11.7(H) 13.4(H)  Hemoglobin 12.0 - 15.0 g/dL 11.0(L) 9.9(L) 9.6(L)  Hematocrit 36.0 - 46.0 % 38.4 34.2(L) 33.8(L)  Platelets 150 - 400 K/uL 308 314 400   Imaging: Last chest xray done 03/31/2014. Diffuse, dense unchanged bilateral pulmonary infiltrates. No pleural effusion or pneumothorax. Stable cardiomegaly.   Medications, Vitals, Labs, and Images reviewed.   ASSESSMENT AND PLAN: 69 y.o. female with a PMH significant for pulmonary hypertension, OSA, OHS, history of DVT and PE, depression, anxiety and smoking who is on day 21 of hospital stay for hypoxic respiratory failure with idiopathic interstitial lung disease. No change seen on PCXR, O2 sats in the 90s on 10L Oxymizer nasal canula. Patient is emotional and depressed about diagnosis and progrnosis. A meeting with Palliative Care and her son has been scheduled for today.   Hypoxic respiratory failure: The differential remains broad for the cause of her ideopathic interstitial lung disease and current symptoms. Possible diagnoses include but are not limited to hypersensitivity pneumonitis (possibly from Cowen Fancier's lung due to prior exposure to pet bird - chlamydia, psittacosis antibody screen results pending) - however, her lack of improvement on steroids makes this less likely, broncheolitis obliterans organizing pneumonia, pulmonary alveolar proteinosis, eosinophilic pathologies such as eosinophilic granuloma or acute pneumonia, and acute interstitial pneumonia. Patient is currently being treated for an atypical pneumonia with doxycycline 11m bid (she is currently on her 16th day of treatment) and steroids. Sjogren's A and B antibody negative. Anti-scl antibody negative. CK and CKMB w/in  normal limits. Aldolase, cyclic citrul peptide antibody IgG, hypersensitivity pneumonitis antibody pending. Crypto antigen negative, antiphospholipid syndrome negative except for phosphatydalserine IgM. Procalcitonin < 0.10. RF elevated (17.3). ESR decreased from 68 to 28. ANCA WNL. Mpo/pr-3 WNL. TSH WNL. Creatinine 0.64.  - IV doxycyline 1011mbid (continue for another 7 days per pulmonary's recommendation for possible psittacosis pending lab results) - Continue IV steroids - Solumedrol 80 mg q12 (down from increased dose over the weekend).  - Continue to diurese with Lasix 4032mV q12 as tolerated.  - maintain O2 stats >85% (expected transient desaturation) - continue nebs (duonebs q6 hrs, pulmicort q6 hrs and albuterol q2 hrs PRN) - Robitussin DM q6 hrs PRN - Continue Tessalon Perles TID - Continue tylenol 500 mg PRN q 6 hours - Needs Prevnar vaccine as outpatient - palliative care meeting with her and her  son today - no longer pursuing goal of BAL or biopsy given that it would not change treatment.  - repeat sputum culture pending  - repeat PCXR to monitor progression   Pulmonary Hypertension: Complicated by OSA, OHS. Patient refuses CPAP. Echo 2013 showed PAP 40-44. Investigating ILD. - Avoid hypoxia  Constipation and Hair Loss: TSH normal. Constipation may be secondary to opioids or iron supplementation. - Senokot-S BID PRN - Miralax daily  Chronic Iron Deficiency Anemia: Hemoglobin stable- 9.9. MCV 78.8. - Continue oral iron supplementation TID  Urge and Stress Incontinence: Macrobid has been stopped, as this has an association with pulmonary hypersensitivity. Patient had been on it for her chronic colonization because she was to receive a urodynamic study as an outpatient.  GERD:  - Continue Protonix 40 mg by  mouth daily. Steroid dose has been lowered back to original dose after being increased this weekend. Add Sucralfate 1g qid on empty stomach.   Depression and Anxiety: Anxiety/depression/crying increased today. Will speak with palliative care with her son to determine goals of care today. Considered adding a mood stabilizer, however given her respiratory failure and already increased dose of Zoloft (up from 261m to 2554m and Ativan PRN, we are going to hold off on this unless she continues to emotionally deteriorate.  - Continue Ativan 1 mg PRN - Continue Lamictal, Seroquel and Zoloft  Tobacco Abuse: Current smoker. Nicotine patch in place.  Vitamin D Deficiency: 15 - Started ergo 50 U weekly  History of DVT and PE:  - Continue chronic lovenox 100 mg BID  Diet: Regular

## 2014-04-03 NOTE — Progress Notes (Addendum)
Palliative Medicine Team Family Meeting  Met with [name] and her son [name]- very psychologically challenging visit. [name] is very quiet, but listens intently and when he did speak it was very clear to me that he had a very good understanding of his mothers entire situation. [name] has from a medical standpoint and from a pulmonary standpoint been mostly stable with no signs of acute decline- she is extremely nihilistic. I may have been to positive today with her as I think her one desire is to have her son [name] want to be with her and show care and concern for her. I stressed how serious her illness is but it is almost impossible to precisely prognosticate. I think it is reasonable to say <6 months given her imaging and O2 requirements but a serious PNA could cause her to decline quickly.  We spent most of our conversation talking about her plans for after hospitalization-she desperately wanted her son to help her with decisions but he simply would not give his opinion and would support her in her own decisions. He told her that he expected her to make her own decisions up until she could no longer make them and until then he would not give his opinion.  [name] was very stoic and shows little outward emotion but asked good questions. He told me that his mom is really afraid to be at home alone, she has been minimally mobile for >3 years. She has an aid for 4 hours a day who cooks for her lays the food out and washes her clothes etc.. She pivots to move and can barely get from her sofa to teh bathroom or to the kitchen. She sleeps on the couch, she has to leave her apartment door open because if she needs EMS they will have to break the door down to get in and she would have to pay for that.  She only leaves the house to see her primary care doctor.   [name] spoke with me outside the room as he was leaving- he said that his mom has been "dying for 30 years", he says she will make things up so he really  appreciated the communication for her doctors. He wants to be notified if she is actively dying- otherwise he is busy at work- he works at a hotel doing maintenance. [name] also told me that she smokes 3-4 packs of cigarettes a day in an enclosed space- no ventilation and asks if that is the reason for her bad lungs instead of the bird. He also says she was supposed to be on oxygen at home but never wore it and it was always off because she was smoking.  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   Plan:  1. [name] has agreed to go to [facility] if they have a bed for her -she has been there in the past and liked it- I do not think she will rehab nor should we insist on rehab- she needs a [place] . She is agreeable to this plan.  [place] to begin moving her forward in her care plan- she is a DNR- we should treat the treatable- ie antibiotics, steroids and her other chonic disease meds and inhalers. Would not focus on the monitors or her oxygen saturations- she has probably  been hypoxic at home for months if not years. Treat her dyspnea with a reasonable amount of nasal cannula O2 -conserving device is fine And focus on comfort by using opiates and anxiolytics to control any air hunger, anxiety or distress. [name] requests that Dr. Eppie Gibson be her hospice attending- I will discuss this with him- would be mostly for certification purposes-hospice team would do all symptom managment at facility.  Our team will continue to follow to discharge as needed.  70 minutes. Greater than 50%  of this time was spent counseling and coordinating care related to the above assessment and plan. [place], [place]

## 2014-04-04 ENCOUNTER — Inpatient Hospital Stay (HOSPITAL_COMMUNITY): Payer: Medicare Other

## 2014-04-04 DIAGNOSIS — R590 Localized enlarged lymph nodes: Secondary | ICD-10-CM

## 2014-04-04 NOTE — Progress Notes (Signed)
Physical Therapy Treatment Patient Details Name: [name] MRN: [id number] DOB: 1945-05-25 Today's Date: 04/04/2014    History of Present Illness 69 yo female with onset of fever, cough, SOB was admitted with acute respiratory failure and CAP. Transfered to step down due increased WOB 03/20/14 and placed on NRB.     PT Comments    Patient declined getting OOB to chair today despite max encouragement and education about benefits of sitting upright. Sat EOB x10 minutes without support. Sa02 ranged from 86-93%. Pt requires constant redirection to stay on task and max coaxing and encouragement during session. Increased time to perform all tasks. Pt continues to be ambivalent about therapy. Goals to be updated as pt not interested in ambulation.    Follow Up Recommendations  SNF;Supervision/Assistance - 24 hour;Other (comment)     Equipment Recommendations  None recommended by PT    Recommendations for Other Services       Precautions / Restrictions Precautions Precautions: Fall Precaution Comments: watch sats Restrictions Weight Bearing Restrictions: No    Mobility  Bed Mobility Overal bed mobility: Needs Assistance Bed Mobility: Supine to Sit;Sit to Supine     Supine to sit: Supervision;HOB elevated Sit to supine: Supervision;HOB elevated   General bed mobility comments: Heavy use of rail to get to EOB. No physical assist needed.  Transfers                 General transfer comment: After getting to EOB, pt declined transfer to chair.  Ambulation/Gait                 Stairs            Wheelchair Mobility    Modified Rankin (Stroke Patients Only)       Balance Overall balance assessment: Needs assistance Sitting-balance support: Feet supported;Single extremity supported Sitting balance-Leahy Scale: Good                              Cognition Arousal/Alertness: Awake/alert Behavior During Therapy: Anxious Overall  Cognitive Status: Within Functional Limits for tasks assessed                      Exercises      General Comments General comments (skin integrity, edema, etc.): Sa02 ranged from 86%-93% during session on oxymizer.      Pertinent Vitals/Pain Pain Assessment: Faces Faces Pain Scale: Hurts a little bit Pain Location: back - chronic Pain Descriptors / Indicators: Aching Pain Intervention(s): Monitored during session    Home Living                      Prior Function            PT Goals (current goals can now be found in the care plan section) Acute Rehab PT Goals Patient Stated Goal: agreeable to placement, ambivalent about rehab Progress towards PT goals: Not progressing toward goals - comment    Frequency  Min 1X/week    PT Plan Current plan remains appropriate    Co-evaluation PT/OT/SLP Co-Evaluation/Treatment: Yes Reason for Co-Treatment: For patient/therapist safety PT goals addressed during session: Mobility/safety with mobility OT goals addressed during session: ADL's and self-care     End of Session Equipment Utilized During Treatment: Gait belt;Oxygen Activity Tolerance: Patient tolerated treatment well Patient left: in bed;with call bell/phone within reach;with bed alarm set     Time: 7741-2878 PT Time Calculation (  min) (ACUTE ONLY): 23 min  Charges:  $Therapeutic Activity: 8-22 mins                    G CodesCandy Sledge A 04/24/2014, 11:46 AM  Candy Sledge, PT, DPT [phone]

## 2014-04-04 NOTE — Progress Notes (Signed)
Occupational Therapy Treatment Patient Details Name: BERDELL NEVITT MRN: [id number] DOB: Jul 21, 1945 Today's Date: 04/04/2014    History of present illness 69 yo female with onset of fever, cough, SOB was admitted with acute respiratory failure and CAP. Transfered to step down due increased WOB 03/20/14 and placed on NRB. Transferred to floor 04/02/13, now on nasal canula. Palliative care involved.   OT comments  Focus of session on bed mobility, sitting tolerance at EOB and grooming.  Pt declined transfer to chair despite encouragement and education on the benefits of OOB activity. Sats remained 88% or above with HR in 70s throughout. Pt remains ambivalent about participation in therapy.  Will decrease frequency to 1x/week.  Follow Up Recommendations  SNF    Equipment Recommendations  None recommended by OT    Recommendations for Other Services      Precautions / Restrictions Precautions Precautions: Fall Precaution Comments: watch sats Restrictions Weight Bearing Restrictions: No       Mobility Bed Mobility Overal bed mobility: Needs Assistance Bed Mobility: Supine to Sit;Sit to Supine     Supine to sit: Supervision Sit to supine: Supervision   General bed mobility comments: HOB elevated and heavy reliance on rail  Transfers                 General transfer comment: Pt declined OOB to chair.    Balance   Sitting-balance support: Feet supported Sitting balance-Leahy Scale: Good (sat EOB x 10 minutes)                             ADL Overall ADL's : Needs assistance/impaired Eating/Feeding: Independent;Bed level   Grooming: Wash/dry hands;Wash/dry face;Set up;Bed level               Lower Body Dressing: Total assistance;Bed level                        Vision                     Perception     Praxis      Cognition   Behavior During Therapy: Anxious Overall Cognitive Status: Within Functional Limits for tasks  assessed                       Extremity/Trunk Assessment               Exercises     Shoulder Instructions       General Comments      Pertinent Vitals/ Pain       Pain Assessment: Faces Faces Pain Scale: Hurts a little bit Pain Location: back--chronic Pain Descriptors / Indicators: Aching Pain Intervention(s): Monitored during session;Repositioned  Home Living                                          Prior Functioning/Environment              Frequency Min 1X/week (decreasing frequency)     Progress Toward Goals  OT Goals(current goals can now be found in the care plan section)  Progress towards OT goals: Goals drowngraded-see care plan  Acute Rehab OT Goals Patient Stated Goal: agreeable to placement, ambivalent about rehab OT Goal Formulation: With patient Time For Goal Achievement: 04/18/14 Potential to Achieve  Goals: Fair ADL Goals Pt Will Perform Grooming: with set-up;sitting (in chair or EOB) Pt Will Perform Upper Body Bathing: with min assist;sitting Pt Will Perform Upper Body Dressing: with set-up;sitting Pt Will Transfer to Toilet: with supervision;bedside commode;squat pivot transfer  Plan Discharge plan remains appropriate    Co-evaluation    PT/OT/SLP Co-Evaluation/Treatment: Yes Reason for Co-Treatment: For patient/therapist safety   OT goals addressed during session: ADL's and self-care      End of Session Equipment Utilized During Treatment: Oxygen   Activity Tolerance Patient tolerated treatment well   Patient Left in bed;with call bell/phone within reach   Nurse Communication          Time: 6283-6629 OT Time Calculation (min): 23 min  Charges: OT General Charges $OT Visit: 1 Procedure OT Treatments $Therapeutic Activity: 8-22 mins  Malka So 04/04/2014, 11:32 AM  [phone]

## 2014-04-04 NOTE — Progress Notes (Signed)
Name: [name] MRN: [id number] DOB: Oct 23, 1945    ADMISSION DATE:  03/13/2014 CONSULTATION DATE:  04/04/2014  REFERRING MD :  Eppie Gibson ([facility])  CHIEF COMPLAINT:  SOB  BRIEF PATIENT DESCRIPTION: 43 yowf  brought to ED 2/22 for SOB.  CXR revealed b/l infiltrates.  She was admitted and treated for CAP.  CT chest a few days later revealed progressive ILD On review of  imaging, ILD dates back to 2010. Seen by [facility] in the past. GGO bialteral scattered with som cysts like lesion in UL since 2008/2009. Heavy smoker as told by son 04/03/14. Bird exposure at home. Was on macrodantin at admit and d/c'd 2/29  SIGNIFICANT EVENTS  2/22 - admit   CXR 2/22 >>> diffuse lung opacification superimposed on ILD. 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 GGO's in crazy paving pattern.  Numerous enlarged mediastinal and hilar lymph nodes.  2/29 - desaturated to 35, placed on NRB, PCCM consulted 3/7- on lower fio2 3/10 PCXR worsening 3/11 started High dose solumedrol x 6 doses 3/13 reduced solumedrol to 80 q 12 04/03/14 Hopeless affect/ not getting oob or using IS at bedside   Bad heart "have it all the time "   Rx per [facility] On 8lpm 02     SUBJECTIVE/OVERNIGHT/INTERVAL HX 04/04/14: Palliative notes noted - otherwise no change.   VITAL SIGNS: Temp:  [97.5 F (36.4 C)-98.5 F (36.9 C)] 97.8 F (36.6 C) (03/15 0503) Pulse Rate:  [61-68] 63 (03/15 0805) Resp:  [13-21] 18 (03/15 0805) BP: (121-138)/(46-66) 138/66 mmHg (03/15 0503) SpO2:  [93 %-98 %] 94 % (03/15 0805) Weight:  [138.2 kg (304 lb 10.8 oz)] 138.2 kg (304 lb 10.8 oz) (03/15 0046)  Intake/Output Summary (Last 24 hours) at 04/04/14 1140 Last data filed at 04/04/14 0503  Gross per 24 hour  Intake    500 ml  Output   2000 ml  Net  -1500 ml   PHYSICAL EXAMINATION: General: Morbidly obese female, no distress Neuro: A&O x 3,  non-focal anxious at times HEENT: [place]/AT, PERRL, EOM-I and +BS. Cardiovascular: RRR, Nl S1/S2, -M/R/G Lungs: Diffuse crackles, distant BS Abdomen: Obese, BS x 4, soft, NT/ND.  Musculoskeletal: No gross deformities edema unchanged Skin: Intact, warm, no rashes.  Recent Labs Lab 03/29/14 0325 03/30/14 0535 03/31/14 0316  NA 138 138 140  K 4.0 3.5 3.8  CL 95* 99 95*  CO2 36* 33* 38*  BUN 21 22 18   CREATININE 0.74 0.64 0.69  GLUCOSE 188* 128* 141*    Recent Labs Lab 03/30/14 0535 04/02/14 0450  HGB 9.9* 11.0*  HCT 34.2* 38.4  WBC 11.7* 13.1*  PLT 314 308   No results found.    ASSESSMENT / PLAN:  Acute on chronic hypoxic respiratory failure w/ underlying chronic Non UIP ILD   - Currently worse due to virus or macrodantin (stopped 2/2916_  or idioppathich flare   - DDx worsening HP, NSIP, DIP (from smoking)     Lab Results  Component Value Date   ESRSEDRATE 28* 03/28/2014   ESRSEDRATE 68* 03/20/2014   ESRSEDRATE 6 06/01/2007     Recs: DuoNebs, Albuterol  High flow O2 nasal cannula reservoir  Continue  prednisone 50 mg PO daily; will slow taoper   Pulmonary Hypertension (echo from 2013, PAP 40-44) - Class 3 likely due to chronic lung disease and OSA. 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 If she can go to a place other than her home will help from a therapeutic standpoint in case there is non-piogeon HP exposure like mold there. Note it is possible she improves and outlives her expected 6 months survivial. PCCM Dr Chase Caller Will see her in office   Dr. Brand Males, M.D., [facility].C.P Pulmonary and Critical Care Medicine Staff Physician [facility] Pager: [phone], If no answer or between  15:00h - 7:00h: call [phone]  04/04/2014 11:45 AM

## 2014-04-04 NOTE — Progress Notes (Signed)
Patient found to have  money in her sock when given bath. RN asked patient to send home with son. Patient refused. Patient wanted money back in sock. Money placed back in sock. Patient told hospital is not responsible if money is lost. Patient verbalized understanding.

## 2014-04-04 NOTE — Progress Notes (Signed)
Subjective: Ms. Imler feels "fine" today. She likes her new room, except that she cannot see her O2 saturations from her bed (unlike her setup in the step-down unit). She feels like everyone is abandoning her and continues to struggle with having multiple doctors take care of her.   Interval Events: - Very helpful meeting with Palliative Care team yesterday - Patient amenable to transition to [facility] for long term care bed with Hospice; SW not hopeful that this will be possible with Medicaid  Objective: Vital signs in last 24 hours: Filed Vitals:   04/03/14 2336 04/04/14 0046 04/04/14 0223 04/04/14 0503  BP: 133/50 124/59  138/66  Pulse: 64 65 61 64  Temp: 98.5 F (36.9 C) 98.5 F (36.9 C)  97.8 F (36.6 C)  TempSrc: Oral Oral  Oral  Resp: 13 18 18 18   Height:  5\' 7"  (1.702 m)    Weight:  304 lb 10.8 oz (138.2 kg)    SpO2: 98% 94% 94% 94%   Weight change: -2 lb 6.8 oz (-1.1 kg)  Intake/Output Summary (Last 24 hours) at 04/04/14 0736 Last data filed at 04/04/14 0503  Gross per 24 hour  Intake    678 ml  Output   2230 ml  Net  -1552 ml   Physical Exam: Appearance: Morbidly obese, lying in bed in NAD on 10 L O2 [place] through oxymizer (satting ~94% during interview) HEENT: AT/[place], PERRL, EOMi Heart: RRR, no murmurs Lungs: slightly improved to have coarse, rhonchorous breathing b/l, but breathing comfortably and moving equal volumes of air, saturations drop when patient is anxious / upset Abdomen: BS+, obese, soft, nontender Extremities: No BLE edema, no tenderness Neurologic: A&Ox3  Lab Results: Basic Metabolic Panel:  Recent Labs Lab 03/30/14 0535 03/31/14 0316  NA 138 140  K 3.5 3.8  CL 99 95*  CO2 33* 38*  GLUCOSE 128* 141*  BUN 22 18  CREATININE 0.64 0.69  CALCIUM 8.4 8.3*  MG 2.3  --   PHOS 3.5  --    CBC:  Recent Labs Lab 03/30/14 0535 04/02/14 0450  WBC 11.7* 13.1*  HGB 9.9* 11.0*  HCT 34.2* 38.4  MCV 78.8 79.5  PLT 314 308    Medications: I have reviewed the patient's current medications. Scheduled Meds: . antiseptic oral rinse  7 mL Mouth Rinse BID  . benzonatate  100 mg Oral TID  . budesonide  0.25 mg Nebulization 4 times per day  . doxycycline  100 mg Oral Q12H  . enoxaparin (LOVENOX) injection  140 mg Subcutaneous Q12H  . fentaNYL  12.5 mcg Transdermal Q72H  . ferrous sulfate  325 mg Oral TID WC  . furosemide  40 mg Intravenous Q12H  . gabapentin  100 mg Oral TID  . ipratropium-albuterol  3 mL Nebulization Q6H  . lamoTRIgine  200 mg Oral q morning - 10a  . LORazepam  1 mg Oral q morning - 10a  . LORazepam  2 mg Oral QHS  . methylPREDNISolone (SOLU-MEDROL) injection  80 mg Intravenous Q12H  . nicotine  14 mg Transdermal QHS  . pantoprazole  40 mg Oral BID AC  . pravastatin  40 mg Oral Daily  . QUEtiapine  400 mg Oral QHS  . senna-docusate  2 tablet Oral BID  . sertraline  250 mg Oral q morning - 10a  . sodium chloride  3 mL Intravenous Q12H  . sucralfate  1 g Oral BID  . vitamin C  500 mg Oral Daily  Assessment/Plan:  Ms. 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 The cause of her acute worsening is yet to be determined. Bronchoscopy or other investigatory measures have been postponed, and pulmonology believes her prognosis to be very poor. A follow-up Palliative Care family meeting occurred last night.  Acute on Chronic Hypoxic Respiratory Failure: Etiology of exacerbating factors remains broad with considerations for hypersensitivity pneumonitis (exposure to dove and macrobid, idiopathic flare, non-specific interstitial pneumonia, desquamative interstitial pneumonia). Focus should shift from monitors / O2 saturations to treating dyspnea, air hunger, anxiety, distress (using opiates and anxiolytics). - Palliative Care family meeting yesterday; appreciate Palliative guidance -  Appreciate PCCM recommendations - Repeat PCXR pending - IV steroids decreased yesterday to 80 mg q12 hours --> will be transitioned to 50 mg PO daily on 3/17 - Continue pantoprazole while on steroids - Extend IV Doxycycline for extra 7 days, will be a total of 21 (started 2/28-->continue through 3/19) - Resume lasix 40 mg IV q 12 hours, Cr stable at 0.69, Co2- 38, cumulative net neg is -9.2L - Continue to wean off oxygen as tolerated - Continue nebs (duonebs q6 hours, pulmicort q6 hours and albuterol q2 hours PRN) - Robitussin-DM q6 hours PRN - Continue Tessalon Perles TID - Continue tylenol 500 mg PRN q 6 hours - Norco and anxiolytics for pain and anxiety - Needs Prevnar vaccine as outpatient - PT and OT- recs- SNF, patient amenable to [facility] - Repeat sputum culture pending (sample needs to be collected)  Mediastinal and Hilar Lymphadenopathy: ACE 95 in 2009-->50 in 02/2014. Not a candidate for biopsy.  Pulmonary Hypertension: Complicated by OSA, OHS. Patient refuses CPAP. Echo 2013 showed PAP 40-44. Investigating ILD. - Avoid hypoxia  Constipation and Hair Loss: TSH normal. Constipation may be secondary to opioids or iron supplementation. - Senokot-S 2 tablets BID PRN - Miralax daily PRN  GERD: Currently bothering the patient.  - Continue protonix 40 mg BID,   Chronic Iron Deficiency Anemia: Hemoglobin stable- 9.6. MCV 79.0. - Continue oral iron supplementation TID  Urge and Stress Incontinence: Macrobid has been stopped, as this has an association with pulmonary hypersensitivity. Patient had been on it for her chronic colonization because she was to receive a urodynamic study as an outpatient.  GERD: Currently bothering the patient; she says that milk has been helping her symptoms. - Continue home protonix 40 mg by mouth daily, pantoprazole 40 mg BID, sucralfate 1 g BID  Depression and Anxiety: Anxiety about health, son and isolation has been present on this admission. -  Continue home Ativan 1 mg by mouth in the am and 2 mg by mouth at night; added ativan 1 mg in the morning and 1 mg PRN for anxiety - Continue home Lamictal 200 mg every morning, Seroquel 400 mg at bedtime - Increased home Zoloft dosing 250 mg daily - Continue discussions about goals of care, wishes for dispo, appreciate Palliative input  Chronic Low Back Pain: Patient has not been using her PRN Norco. - Continue home Norco q6 hours PRN - Tylenol PRN  - Gabapentin 100 mg TID - Added fentanyl patch for pain    Tobacco Abuse: Current smoker.  - Nicotine patch in place  History of DVT and PE:  - Continue chronic lovenox 100 mg BID  Diet: Regular  Dispo: Disposition is deferred at this time, awaiting improvement of current medical problems. Pending  placement at [facility].    LOS: 22 days   Karlene Einstein, MD 04/04/2014, 7:36 AM

## 2014-04-04 NOTE — Progress Notes (Addendum)
Palliative Care Team at [facility] Progress Note   SUBJECTIVE: Comfortable. Pleasant, dyspnea at rest  OBJECTIVE: Vital Signs: BP 138/66 mmHg  Pulse 69  Temp(Src) 97.8 F (36.6 C) (Oral)  Resp 18  Ht 5\' 7"  (1.702 m)  Wt 138.2 kg (304 lb 10.8 oz)  BMI 47.71 kg/m2  SpO2 92%   Intake and Output: 03/14 0701 - 03/15 0700 In: 678 [P.O.:675; I.V.:3] Out: 2230 [Urine:2230]  Physical Exam: General: Chronically ill appearing  Head: normal  Lungs:  +crackles throughout low volumes  Heart: normal  Abdomen:  normal  Extremities: +edema    Allergies  Allergen Reactions  . Adhesive [Tape] Other (See Comments)    "I break out all over"; Can only use paper tape  . Codeine Other (See Comments)    "first dose I took, my aide found me the next day laying on the floor"  . Morphine Other (See Comments)    caused change in mental status, she does not want to try it again.  . Sulfonamide Derivatives Nausea And Vomiting  . Thorazine [Chlorpromazine] Nausea Only    Medications: Scheduled Meds:  . antiseptic oral rinse  7 mL Mouth Rinse BID  . benzonatate  100 mg Oral TID  . budesonide  0.25 mg Nebulization 4 times per day  . doxycycline  100 mg Oral Q12H  . enoxaparin (LOVENOX) injection  140 mg Subcutaneous Q12H  . fentaNYL  12.5 mcg Transdermal Q72H  . ferrous sulfate  325 mg Oral TID WC  . furosemide  40 mg Intravenous Q12H  . gabapentin  100 mg Oral TID  . ipratropium-albuterol  3 mL Nebulization Q6H  . lamoTRIgine  200 mg Oral q morning - 10a  . LORazepam  1 mg Oral q morning - 10a  . LORazepam  2 mg Oral QHS  . methylPREDNISolone (SOLU-MEDROL) injection  80 mg Intravenous Q12H  . nicotine  14 mg Transdermal QHS  . pantoprazole  40 mg Oral BID AC  . pravastatin  40 mg Oral Daily  . QUEtiapine  400 mg Oral QHS  . senna-docusate  2 tablet Oral BID  . sertraline  250 mg Oral q morning - 10a  . sodium chloride  3 mL Intravenous Q12H  . sucralfate  1 g Oral BID  . vitamin C   500 mg Oral Daily    Continuous Infusions:    PRN Meds: acetaminophen, albuterol, HYDROcodone-acetaminophen, LORazepam, polyethylene glycol  Stool Softner: yes  Palliative Performance Scale: 40%   Labs: CBC    Component Value Date/Time   WBC 13.1* 04/02/2014 0450   WBC 5.5 10/19/2012 1248   RBC 4.83 04/02/2014 0450   RBC 4.85 10/19/2012 1248   RBC 2.92* 12/23/2011 0200   HGB 11.0* 04/02/2014 0450   HGB 14.1 10/19/2012 1248   HCT 38.4 04/02/2014 0450   HCT 43.5 10/19/2012 1248   PLT 308 04/02/2014 0450   PLT 237 10/19/2012 1248   MCV 79.5 04/02/2014 0450   MCV 89.6 10/19/2012 1248   MCH 22.8* 04/02/2014 0450   MCH 29.0 10/19/2012 1248   MCHC 28.6* 04/02/2014 0450   MCHC 32.3 10/19/2012 1248   RDW 20.4* 04/02/2014 0450   RDW 15.7* 10/19/2012 1248   LYMPHSABS 0.9 03/21/2014 0320   LYMPHSABS 1.3 10/19/2012 1248   MONOABS 0.4 03/21/2014 0320   MONOABS 0.3 10/19/2012 1248   EOSABS 0.1 03/21/2014 0320   EOSABS 0.1 10/19/2012 1248   BASOSABS 0.0 03/21/2014 0320   BASOSABS 0.0 10/19/2012 1248  CMET     Component Value Date/Time   NA 140 03/31/2014 0316   NA 144 10/19/2012 1248   K 3.8 03/31/2014 0316   K 3.4* 10/19/2012 1248   CL 95* 03/31/2014 0316   CL 105 06/17/2012 1341   CO2 38* 03/31/2014 0316   CO2 27 10/19/2012 1248   GLUCOSE 141* 03/31/2014 0316   GLUCOSE 117 10/19/2012 1248   GLUCOSE 90 06/17/2012 1341   BUN 18 03/31/2014 0316   BUN 7.3 10/19/2012 1248   CREATININE 0.69 03/31/2014 0316   CREATININE 0.61 02/02/2013 1458   CREATININE 0.7 10/19/2012 1248   CALCIUM 8.3* 03/31/2014 0316   CALCIUM 8.9 10/19/2012 1248   PROT 6.5 03/13/2014 1554   PROT 6.4 10/19/2012 1248   ALBUMIN 3.4* 03/13/2014 1554   ALBUMIN 3.3* 10/19/2012 1248   AST 15 03/13/2014 1554   AST 11 10/19/2012 1248   ALT 11 03/13/2014 1554   ALT 7 10/19/2012 1248   ALKPHOS 87 03/13/2014 1554   ALKPHOS 107 10/19/2012 1248   BILITOT 0.4 03/13/2014 1554   BILITOT 0.35 10/19/2012  1248   GFRNONAA 87* 03/31/2014 0316   GFRNONAA >89 02/02/2013 1458   GFRAA >90 03/31/2014 0316   GFRAA >89 02/02/2013 1458   ASSESSMENT/ PLAN: 69 yo woman with depression, PTSD and dependent PD with advanced end stage ILD-very poor functional status-bedbound. Acute decompensation in hospital. Prognosis <6 months stable for now but requiring very high O2 on oximizer at 8L. -Stop continuous O2 sat monitoring- treat her for comfort not the numbers -d/c tele -continue all current comfort meds -Armandina Gemma Rod should go with patient -continue oximizer - goals are comfort -Complete abx and steroids -maintain Foley for comfort -Continue nicotine patches -Continue current depression medication regimen -Continue Pain med regimen- she is tolerating this very well  She is agreeable for [facility]- with hospice- she has Medicaid so this should be possible. She requests Dr. Eppie Gibson be her Hospice Attending- Hospice Physicians and Team will be able to manage her symptoms. Please note need for hospice referral in bold on the discharge summary.    Time In: 5:30 Time Out: 6:30 Total Time Spent with Patient: 60 minutes Total Overall Time: 60 minutes   Greater than 50%  of this time was spent counseling and coordinating care related to the above assessment and plan.   Acquanetta Chain, DO  04/04/2014, 6:26 PM  Please contact Palliative Medicine Team phone at [phone] for questions and concerns.

## 2014-04-04 NOTE — Progress Notes (Signed)
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   Morbid obesity with BMI of 50.0-59.9, adult   Gastric antral vascular ectasia   Hyperlipidemia LDL goal <130   Anxiety   Gastroesophageal reflux disease   Osteoporosis   CAP (community acquired pneumonia)   Anemia, iron deficiency   Pneumonitis, hypersensitivity, avian   Pneumonitis   Vitamin D deficiency   Absolute anemia   Pulmonary infiltrates   Respiratory failure   Dyspnea   ILD (interstitial lung disease)   Psittacosis      Code Status Orders        Start     Ordered   03/31/14 1420  Do not attempt resuscitation (DNR)   Continuous    Question Answer Comment  In the event of cardiac or respiratory ARREST Do not call a "code blue"   In the event of cardiac or respiratory ARREST Do not perform Intubation, CPR, defibrillation or ACLS   In the event of cardiac or respiratory ARREST Use medication by any route, position, wound care, and other measures to relive pain and suffering. May use oxygen, suction and manual treatment of airway obstruction as needed for comfort.      03/31/14 1419      Length of Stay (days):22   SUBJECTIVE/24 HOUR EVENTS: 69 y.o. female on day 22 of her hospital stay for hypoxic respiratory failure due to ideopathic interstitial lung disease. Patient on 10L nasal canula (Oxymizer) with O2 sats in the 90s. Patient was moved out of step down after she had a meeting with her son present last night with Dr. Hilma Favors and [place], which she states went ok. They spoke about her prognosis and her options on facilities to be transfered to out of the hospital. She seems much better today in terms of emotional stability but did express concern that she felt like she was having to deal  with a lot very quickly and felt as though people were giving up on her and were "just trying to get her out of here".  She asked multiple questions about medications she is on and if they are being "taken away" - we explained to her that she was just continuing the antibiotic until the course was complete (21 day course) and that her steroids would just be tapered down and switched to PO instead of IV (all in line with care protocol). She seemed to understand all of the decisions being made and why they were being made. OT was coming in the room as we left, we she didn't seem happy with.  She is no longer complaining of increased acid reflux.    OBJECTIVE: Filed Vitals:   04/04/14 0046 04/04/14 0223 04/04/14 0503 04/04/14 0805  BP: 124/59  138/66   Pulse: 65 61 64 63  Temp: 98.5 F (36.9 C)  97.8 F (36.6 C)   TempSrc: Oral  Oral   Resp: 18 18 18 18   Height: 5' 7"  (1.702 m)     Weight: 138.2 kg (304 lb 10.8 oz)     SpO2: 94% 94% 94% 94%    Intake/Output Summary (Last 24 hours) at 04/04/14 1154 Last data filed at 04/04/14 0503  Gross per 24 hour  Intake    500 ml  Output  2000 ml  Net  -1500 ml    Intake/Output last 3 shifts: I/O last 3 completed shifts: In: 918 [P.O.:915; I.V.:3] Out: 4580 [Urine:4580]  Allergies  Allergen Reactions  . Adhesive [Tape] Other (See Comments)    "I break out all over"; Can only use paper tape  . Codeine Other (See Comments)    "first dose I took, my aide found me the next day laying on the floor"  . Morphine Other (See Comments)    caused change in mental status, she does not want to try it again.  . Sulfonamide Derivatives Nausea And Vomiting  . Thorazine [Chlorpromazine] Nausea Only    Medications: Scheduled Meds: . antiseptic oral rinse  7 mL Mouth Rinse BID  . benzonatate  100 mg Oral TID  . budesonide  0.25 mg Nebulization 4 times per day  . doxycycline  100 mg Oral Q12H  . enoxaparin (LOVENOX) injection  140 mg Subcutaneous Q12H   . fentaNYL  12.5 mcg Transdermal Q72H  . ferrous sulfate  325 mg Oral TID WC  . furosemide  40 mg Intravenous Q12H  . gabapentin  100 mg Oral TID  . ipratropium-albuterol  3 mL Nebulization Q6H  . lamoTRIgine  200 mg Oral q morning - 10a  . LORazepam  1 mg Oral q morning - 10a  . LORazepam  2 mg Oral QHS  . methylPREDNISolone (SOLU-MEDROL) injection  80 mg Intravenous Q12H  . nicotine  14 mg Transdermal QHS  . pantoprazole  40 mg Oral BID AC  . pravastatin  40 mg Oral Daily  . QUEtiapine  400 mg Oral QHS  . senna-docusate  2 tablet Oral BID  . sertraline  250 mg Oral q morning - 10a  . sodium chloride  3 mL Intravenous Q12H  . sucralfate  1 g Oral BID  . vitamin C  500 mg Oral Daily   Continuous Infusions:  PRN Meds:.acetaminophen, albuterol, HYDROcodone-acetaminophen, LORazepam, polyethylene glycol  Physical Exam: GEN: AAOx3, emotional but less than yesterday, morbidly obese  HEENT: MMM, EOMI, PERRLA, b/l sclera anicteric, no conjunctival injection CV: RRR, S1S2nl, no murmurs/rubs/gallops PULM: coarse, rhonchrous breathing bilaterally, no change  ABD: soft, ND/NT, no rebound, no guarding, normal/active bowel sounds  EXT: no cyanosis, clubbing or edema  NEURO: no focal deficits  PSYCH: nl affect, nl speech   Labs: Recent Labs     04/02/14  0450  HGB  11.0*  HCT  38.4  PLT  308    CBC Latest Ref Rng 04/02/2014 03/30/2014 03/26/2014  WBC 4.0 - 10.5 K/uL 13.1(H) 11.7(H) 13.4(H)  Hemoglobin 12.0 - 15.0 g/dL 11.0(L) 9.9(L) 9.6(L)  Hematocrit 36.0 - 46.0 % 38.4 34.2(L) 33.8(L)  Platelets 150 - 400 K/uL 308 314 400    BMP Latest Ref Rng 03/31/2014 03/30/2014 03/29/2014  Glucose 70 - 99 mg/dL 141(H) 128(H) 188(H)  BUN 6 - 23 mg/dL 18 22 21   Creatinine 0.50 - 1.10 mg/dL 0.69 0.64 0.74  Sodium 135 - 145 mmol/L 140 138 138  Potassium 3.5 - 5.1 mmol/L 3.8 3.5 4.0  Chloride 96 - 112 mmol/L 95(L) 99 95(L)  CO2 19 - 32 mmol/L 38(H) 33(H) 36(H)  Calcium 8.4 - 10.5 mg/dL 8.3(L) 8.4  8.4    CMP Latest Ref Rng 03/31/2014 03/30/2014 03/29/2014  Glucose 70 - 99 mg/dL 141(H) 128(H) 188(H)  BUN 6 - 23 mg/dL 18 22 21   Creatinine 0.50 - 1.10 mg/dL 0.69 0.64 0.74  Sodium 135 - 145 mmol/L 140 138 138  Potassium 3.5 - 5.1 mmol/L 3.8 3.5 4.0  Chloride 96 - 112 mmol/L 95(L) 99 95(L)  CO2 19 - 32 mmol/L 38(H) 33(H) 36(H)  Calcium 8.4 - 10.5 mg/dL 8.3(L) 8.4 8.4  Total Protein 6.0 - 8.3 g/dL - - -  Total Bilirubin 0.3 - 1.2 mg/dL - - -  Alkaline Phos 39 - 117 U/L - - -  AST 0 - 37 U/L - - -  ALT 0 - 35 U/L - - -    Images: Last chest xray completed 03/31/2014. Need to repeat.   Medications, Vitals, Labs, and Images reviewed.   ASSESSMENT AND PLAN: 69 y.o. female with a PMH significant for pulmonary hypertension, OSA, OHS, history of DVT and PE, depression, anxiety and smoking who is on day 22 of hospital stay for hypoxic respiratory failure with idiopathic interstitial lung disease. O2 sats in the 90s on 10L Oxymizer nasal canula. Patient is emotional/depressed about diagnosis and progrnosis.  Hypoxic respiratory failure: The differential remains broad for the cause of her ideopathic interstitial lung disease and current symptoms. Possible diagnoses include but are not limited to hypersensitivity pneumonitis (possibly from [place] Fancier's lung due to prior exposure to pet bird - chlamydia, psittacosis antibody screen results pending) - however, her lack of improvement on steroids makes this less likely, broncheolitis obliterans organizing pneumonia, pulmonary alveolar proteinosis, eosinophilic pathologies such as eosinophilic granuloma or acute pneumonia, and acute interstitial pneumonia. Patient is currently being treated for an atypical pneumonia with doxycycline 171m bid (she is currently on her 17th day of treatment) and steroids. Sjogren's A and B antibody negative. Anti-scl antibody negative. CK and CKMB w/in normal limits. Aldolase, cyclic citrul peptide antibody IgG,  hypersensitivity pneumonitis antibody pending. Crypto antigen negative, antiphospholipid syndrome negative except for phosphatydalserine IgM. Procalcitonin < 0.10. RF elevated (17.3). ESR decreased from 68 to 28. ANCA WNL. Mpo/pr-3 WNL. TSH WNL. Creatinine 0.64.  - IV doxycyline 1085mbid (continue for another 4 days per pulmonary's recommendation for possible psittacosis pending lab results) - Continue IV steroids - Solumedrol 80 mg q12 (down from increased dose over the weekend).Will change to PO with taper.  - Continue to diurese with Lasix 4050mV q12 as tolerated.  - maintain O2 stats >85% (expected transient desaturation) - continue nebs (duonebs q6 hrs, pulmicort q6 hrs and albuterol q2 hrs PRN) - Robitussin DM q6 hrs PRN - Continue Tessalon Perles TID - Continue tylenol 500 mg PRN q 6 hours - Needs Prevnar vaccine as outpatient - palliative care meeting with her and her son yesterday - no longer pursuing goal of BAL or biopsy given that it would not change treatment.  - repeat sputum culture pending  - repeat PCXR to monitor progression(last on 03/31/2014)   Pulmonary Hypertension: Complicated by OSA, OHS. Patient refuses CPAP. Echo 2013 showed PAP 40-44. Investigating ILD. - Avoid hypoxia  Constipation and Hair Loss: TSH normal. Constipation may be secondary to opioids or iron supplementation. - Senokot-S BID PRN - Miralax daily  Chronic Iron Deficiency Anemia: Hemoglobin stable- 11.0. MCV 79.5. - Continue oral iron supplementation TID  Urge and Stress Incontinence: Macrobid has been stopped, as this has an association with pulmonary hypersensitivity. Patient had been on it for her chronic colonization because she was to receive a urodynamic study as an outpatient.  GERD:  - Continue Protonix 40 mg by mouth  daily. Steroid dose has been lowered back to original dose after being increased this weekend. Add Sucralfate 1g qid on empty stomach.   Depression  and Anxiety: Anxiety/depression/crying increased today. Will speak with palliative care with her son to determine goals of care today. Considered adding a mood stabilizer, however given her respiratory failure and already increased dose of Zoloft (up from 258m to 2520m and Ativan PRN, we are going to hold off on this unless she continues to emotionally deteriorate.  - Continue Ativan 1 mg PRN - Continue Lamictal, Seroquel and Zoloft  Tobacco Abuse: Current smoker. Nicotine patch in place.  Vitamin D Deficiency: 15 - Started ergo 50 U weekly  History of DVT and PE:  - Continue chronic lovenox 100 mg BID  Diet: Regular

## 2014-04-04 NOTE — Progress Notes (Signed)
Called and gave report to [place], Therapist, sports. Pt safely transported to 6N with RN and NT. RN at bedside.

## 2014-04-04 NOTE — Clinical Social Work Note (Signed)
CSW met with patient to discuss options for SNF for long term care.  Patient has been offered a bed at [facility], and [facility].  Patient states she would like to go to [facility], CSW informed her that [facility] does not have any long term beds available only short term rehab.  Patient was explained because she is not participating in therapy and has medicaid as a secondary her choices of facilities are very limited.  Patient asked for information about both facilities, CSW printed information about facilities and it was given to patient to review.  Patient requested to contact her son as well and let him know, CSW called and left a message for patient's son awaiting call back.  CSW was informed by [facility] if patient can be on a different anticoagulant besides Lovonox they can accept her.  CSW notified patient's nurse to discuss with physician.  CSW to follow up with patient tomorrow to find out what facility she has decided on, CSW updated both SNFs on discharge plan.  Patient has FL2 on chart to be signed.    Jones Broom. [place], MSW, [place] 04/04/2014 4:16 PM

## 2014-04-05 LAB — CHLAMYDIA ANTIBODIES, IGG
Chlamydia Pneumoniae IgG: <1:64 {titer}
Chlamydia Pneumoniae IgM: <1:10 {titer}
Chlamydia Psittaci IgG: <1:64 {titer}

## 2014-04-05 MED ORDER — APIXABAN 5 MG PO TABS
5.0000 mg | ORAL_TABLET | Freq: Two times a day (BID) | ORAL | Status: DC
  Administered 2014-04-05 – 2014-04-06 (×2): 5 mg via ORAL
  Filled 2014-04-05 (×2): qty 1

## 2014-04-05 NOTE — Progress Notes (Signed)
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   Morbid obesity with BMI of 50.0-59.9, adult   Gastric antral vascular ectasia   Hyperlipidemia LDL goal <130   Anxiety   Gastroesophageal reflux disease   Osteoporosis   CAP (community acquired pneumonia)   Anemia, iron deficiency   Pneumonitis, hypersensitivity, avian   Pneumonitis   Vitamin D deficiency   Absolute anemia   Pulmonary infiltrates   Respiratory failure   Dyspnea   ILD (interstitial lung disease)   Psittacosis      Code Status Orders        Start     Ordered   03/31/14 1420  Do not attempt resuscitation (DNR)   Continuous    Question Answer Comment  In the event of cardiac or respiratory ARREST Do not call a "code blue"   In the event of cardiac or respiratory ARREST Do not perform Intubation, CPR, defibrillation or ACLS   In the event of cardiac or respiratory ARREST Use medication by any route, position, wound care, and other measures to relive pain and suffering. May use oxygen, suction and manual treatment of airway obstruction as needed for comfort.      03/31/14 1419      Length of Stay (days):23   SUBJECTIVE/24 HOUR EVENTS: 69 y.o. female on day 23 of her hospital stay for hypoxic respiratory failure due to ideopathic interstitial lung disease. Patient on 7L nasal canula (Oxymizer) with O2 sats in the 90s.  She seems much better today in terms of emotional stability and seemed pleased with her possible placement at [facility] today for long-term care, but we have since heard from the social worker that they would not take her. We are continuing to look into other possibilities for her and will consult clinical social work. Patient had a repeat xray yesterday that was very mildly  improved but still showed significant infiltrates/signs of interstitial lung disease.    OBJECTIVE: Filed Vitals:   04/05/14 0123 04/05/14 0517 04/05/14 0816 04/05/14 1045  BP:  119/48  124/69  Pulse: 76 75  78  Temp:  97.5 F (36.4 C)    TempSrc:  Oral    Resp: 18 17    Height:      Weight:      SpO2: 93% 92% 93%     Intake/Output Summary (Last 24 hours) at 04/05/14 1150 Last data filed at 04/05/14 0900  Gross per 24 hour  Intake    360 ml  Output   1150 ml  Net   -790 ml    Intake/Output last 3 shifts: I/O last 3 completed shifts: In: 600 [P.O.:600] Out: 2350 [Urine:2350]  Allergies  Allergen Reactions  . Adhesive [Tape] Other (See Comments)    "I break out all over"; Can only use paper tape  . Codeine Other (See Comments)    "first dose I took, my aide found me the next day laying on the floor"  . Morphine Other (See Comments)    caused change in mental status, she does not want to try it again.  . Sulfonamide Derivatives Nausea And Vomiting  . Thorazine [Chlorpromazine] Nausea Only    Medications: Scheduled Meds: . antiseptic oral rinse  7 mL Mouth Rinse BID  . apixaban  5 mg Oral BID  . benzonatate  100 mg Oral TID  . budesonide  0.25 mg Nebulization 4 times per day  . doxycycline  100 mg Oral Q12H  . fentaNYL  12.5 mcg Transdermal Q72H  . ferrous sulfate  325 mg Oral TID WC  . gabapentin  100 mg Oral TID  . ipratropium-albuterol  3 mL Nebulization Q6H  . lamoTRIgine  200 mg Oral q morning - 10a  . LORazepam  1 mg Oral q morning - 10a  . LORazepam  2 mg Oral QHS  . methylPREDNISolone (SOLU-MEDROL) injection  80 mg Intravenous Q12H  . nicotine  14 mg Transdermal QHS  . pantoprazole  40 mg Oral BID AC  . pravastatin  40 mg Oral Daily  . QUEtiapine  400 mg Oral QHS  . senna-docusate  2 tablet Oral BID  . sertraline  250 mg Oral q morning - 10a  . sodium chloride  3 mL Intravenous Q12H  . sucralfate  1 g Oral BID  . vitamin C  500 mg Oral Daily    Continuous Infusions:  PRN Meds:.acetaminophen, albuterol, HYDROcodone-acetaminophen, LORazepam, polyethylene glycol  Physical Exam: GEN: AAOx3, significantly less emotional today. HEENT: MMM, EOMI, PERRLA, b/l sclera anicteric, no conjunctival injection CV: RRR, S1S2nl, no murmurs/rubs/gallops PULM: coarse, rhonchrous breathing bilaterally. End-expiratory wheezes bilaterally. No change  ABD: soft, ND/NT, no rebound, no guarding, normal/active bowel sounds  EXT: no cyanosis, clubbing or edema  NEURO: no focal deficits  PSYCH: nl affect, nl speech  Labs: No results for input(s): ADJUSTEDWBC, HGB, HCT, PLT, NA, K, CL, CO2, BUN, CREATININE, GLU, CALCIUM, PHOS in the last 72 hours.  Invalid input(s): MAG  CBC Latest Ref Rng 04/02/2014 03/30/2014 03/26/2014  WBC 4.0 - 10.5 K/uL 13.1(H) 11.7(H) 13.4(H)  Hemoglobin 12.0 - 15.0 g/dL 11.0(L) 9.9(L) 9.6(L)  Hematocrit 36.0 - 46.0 % 38.4 34.2(L) 33.8(L)  Platelets 150 - 400 K/uL 308 314 400    BMP Latest Ref Rng 03/31/2014 03/30/2014 03/29/2014  Glucose 70 - 99 mg/dL 141(H) 128(H) 188(H)  BUN 6 - 23 mg/dL 18 22 21   Creatinine 0.50 - 1.10 mg/dL 0.69 0.64 0.74  Sodium 135 - 145 mmol/L 140 138 138  Potassium 3.5 - 5.1 mmol/L 3.8 3.5 4.0  Chloride 96 - 112 mmol/L 95(L) 99 95(L)  CO2 19 - 32 mmol/L 38(H) 33(H) 36(H)  Calcium 8.4 - 10.5 mg/dL 8.3(L) 8.4 8.4    CMP Latest Ref Rng 03/31/2014 03/30/2014 03/29/2014  Glucose 70 - 99 mg/dL 141(H) 128(H) 188(H)  BUN 6 - 23 mg/dL 18 22 21   Creatinine 0.50 - 1.10 mg/dL 0.69 0.64 0.74  Sodium 135 - 145 mmol/L 140 138 138  Potassium 3.5 - 5.1 mmol/L 3.8 3.5 4.0  Chloride 96 - 112 mmol/L 95(L) 99 95(L)  CO2 19 - 32 mmol/L 38(H) 33(H) 36(H)  Calcium 8.4 - 10.5 mg/dL 8.3(L) 8.4 8.4  Total Protein 6.0 - 8.3 g/dL - - -  Total Bilirubin 0.3 - 1.2 mg/dL - - -  Alkaline Phos 39 - 117 U/L - - -  AST 0 - 37 U/L - - -  ALT 0 - 35 U/L - - -    Images: Chest X-Ray (04/04/2014) - Mildly improved diffuse  bilateral pneumonia or alveolar pulmonary edema. Diffuse bilateral airspace opacity is again demonstrated, with some improvement. No pleural fluid seen. The cardiac silhouette remains mildly enlarged with a poor inspiration noted.   Medications, Vitals, Labs, and Images reviewed.   ASSESSMENT AND PLAN: 69  y.o. female with a PMH significant for pulmonary hypertension, OSA, OHS, history of DVT and PE, depression, anxiety and smoking who is on day 23 of hospital stay for hypoxic respiratory failure with idiopathic interstitial lung disease. O2 sats in the 90s on 7L Oxymizer nasal canula.  Hypoxic respiratory failure: The differential remains broad for the cause of her ideopathic interstitial lung disease and current symptoms. Possible diagnoses include but are not limited to hypersensitivity pneumonitis (possibly from [place] Fancier's lung due to prior exposure to pet bird - chlamydia, psittacosis antibody screen results pending) - however, her lack of improvement on steroids makes this less likely, broncheolitis obliterans organizing pneumonia, pulmonary alveolar proteinosis, eosinophilic pathologies such as eosinophilic granuloma or acute pneumonia, and acute interstitial pneumonia. Patient is currently being treated for an atypical pneumonia with doxycycline 172m bid (she is currently on her 17th day of treatment) and steroids. Sjogren's A and B antibody negative. Anti-scl antibody negative. CK and CKMB w/in normal limits. Aldolase, cyclic citrul peptide antibody IgG, hypersensitivity pneumonitis antibody pending. Crypto antigen negative, antiphospholipid syndrome negative except for phosphatydalserine IgM. Procalcitonin < 0.10. RF elevated (17.3). ESR decreased from 68 to 28. ANCA WNL. Mpo/pr-3 WNL. TSH WNL. Creatinine 0.64.  - IV doxycyline 1032mbid (continue for another 3 days per pulmonary's recommendation for possible psittacosis pending lab results) - Continue IV steroids -  Solumedrol 80 mg q12. Will switch tomorrow to 5024mO daily with gradual taper.  - Discontinue Lasix 48m44m q12. Monitor for increased fluid retention.  - maintain O2 stats >85% (expected transient desaturation). Continue to wean off O2 as tolerated.  - continue nebs (duonebs q6 hrs, pulmicort q6 hrs and albuterol q2 hrs PRN) - Robitussin DM q6 hrs PRN - Continue Tessalon Perles TID - Continue tylenol 500 mg PRN q 6 hours - Needs Prevnar vaccine as outpatient - no longer pursuing goal of BAL or biopsy given that it would not change treatment.  - repeat sputum culture pending  - discontinued telemetry   - maintain foley for comfort   Pulmonary Hypertension: Complicated by OSA, OHS. Patient refuses CPAP. Echo 2013 showed PAP 40-44. Investigating ILD. - Avoid hypoxia  Mediastinal and Hilar Lymphadenopathy: ACE 95 in 2009-->50 in 02/2014. Not a candidate for biopsy  Constipation and Hair Loss: TSH normal. Constipation may be secondary to opioids or iron supplementation. - Senokot-S BID PRN - Miralax daily  Chronic Iron Deficiency Anemia: Hemoglobin stable- 11.0. MCV 79.5. - Continue oral iron supplementation TID  Urge and Stress Incontinence: Macrobid has been stopped, as this has an association with pulmonary hypersensitivity. Patient had been on it for her chronic colonization because she was to receive a urodynamic study as an outpatient.  GERD:  - Continue Protonix 40 mg by mouth daily.   Chronic Iron Deficiency Anemia: Hemoglobin stable- 9.6. MCV 79.0. - Continue oral iron supplementation TID  Depression and Anxiety: Patient not as emotional/upset today. Seems to be better understanding and excepting of her prognosis given an unknown cause to her diagnosis.  - Continue Ativan 1 mg in the morning, 2mg 28mnight. 1mg P41mfor anxiety.  - Continue  Lamictal 200mg e8m morning, Seroquel 400mg at69mtime and Zoloft 250mg dai60m  Chronic Low Back Pain: Patient has not been using her PRN Norco. Pain is in good control. - Continue home Norco q6 hours PRN - Tylenol PRN  - Gabapentin 100 mg TID - Added fentanyl patch for pain  Tobacco Abuse: Current smoker. Nicotine patch in place.  Vitamin  D Deficiency: 15 - Started ergo 50 U weekly  History of DVT and PE:  - Long term care will not accept patient on lovenox 100 mg BID for lifelong prophylaxis (possibily due to costs). Switch to Eliquis 9m BID.   Diet: Regular   Dispo: Pending Placement at long term care facility.

## 2014-04-05 NOTE — Clinical Social Work Note (Addendum)
CSW spoke to [facility] who is considering taking patient.  [facility] is sending a representative today to evaluate patient and determine if facility can accept her.  CSW to continue following patient for discharge plan to SNF for long term care followed by hospice per recommendation by palliative care.  Received phone call from [facility], who stated after reevaluating patient they can not take her now due to her complex issues.  CSW received phone call from [facility] who reviewed patient's case and determined they can not take her.  CSW will continue to find placement for patient.    Jones Broom. [place], MSW, [place] 04/05/2014 4:31 PM

## 2014-04-05 NOTE — Progress Notes (Signed)
Subjective: [name] feels "good" today. She had a nice conversation with Dr. Eppie Gibson, her PCP, last night and realizes it is her "time to leave the hospital". She would like to go to [facility] for her long term care.  Interval Events: - Evaluation by [facility] scheduled for today; appreciate SW help - Repeat CXR on 3/15 showed mildly improved diffuse bilateral pneumonia or alveolar pulmonary edema  Objective: Vital signs in last 24 hours: Filed Vitals:   04/04/14 2107 04/05/14 0123 04/05/14 0517 04/05/14 0816  BP: 132/43  119/48   Pulse: 71 76 75   Temp: 98.2 F (36.8 C)  97.5 F (36.4 C)   TempSrc: Oral  Oral   Resp: 18 18 17    Height:      Weight:      SpO2: 95% 93% 92% 93%   Weight change:   Intake/Output Summary (Last 24 hours) at 04/05/14 0943 Last data filed at 04/04/14 1810  Gross per 24 hour  Intake    360 ml  Output    650 ml  Net   -290 ml   Physical Exam: Appearance: Morbidly obese, lying in bed in NAD on 6 L O2 [place] through oxymizer (satting ~94% just prior to interview) HEENT: AT/[place], PERRL, EOMi Heart: RRR, no murmurs Lungs: slightly improved to have coarse, rhonchorous breathing b/l, but breathing comfortably and moving equal volumes of air, saturations drop when patient is anxious / upset Abdomen: BS+, obese, soft, nontender Extremities: No BLE edema, no tenderness Neurologic: A&Ox3  Lab Results: Basic Metabolic Panel:  Recent Labs Lab 03/30/14 0535 03/31/14 0316  NA 138 140  K 3.5 3.8  CL 99 95*  CO2 33* 38*  GLUCOSE 128* 141*  BUN 22 18  CREATININE 0.64 0.69  CALCIUM 8.4 8.3*  MG 2.3  --   PHOS 3.5  --    CBC:  Recent Labs Lab 03/30/14 0535 04/02/14 0450  WBC 11.7* 13.1*  HGB 9.9* 11.0*  HCT 34.2* 38.4  MCV 78.8 79.5  PLT 314 308   Medications: I have reviewed the patient's current medications. Scheduled Meds: . antiseptic oral rinse  7 mL Mouth Rinse BID  . benzonatate  100 mg Oral TID  . budesonide  0.25 mg  Nebulization 4 times per day  . doxycycline  100 mg Oral Q12H  . enoxaparin (LOVENOX) injection  140 mg Subcutaneous Q12H  . fentaNYL  12.5 mcg Transdermal Q72H  . ferrous sulfate  325 mg Oral TID WC  . furosemide  40 mg Intravenous Q12H  . gabapentin  100 mg Oral TID  . ipratropium-albuterol  3 mL Nebulization Q6H  . lamoTRIgine  200 mg Oral q morning - 10a  . LORazepam  1 mg Oral q morning - 10a  . LORazepam  2 mg Oral QHS  . methylPREDNISolone (SOLU-MEDROL) injection  80 mg Intravenous Q12H  . nicotine  14 mg Transdermal QHS  . pantoprazole  40 mg Oral BID AC  . pravastatin  40 mg Oral Daily  . QUEtiapine  400 mg Oral QHS  . senna-docusate  2 tablet Oral BID  . sertraline  250 mg Oral q morning - 10a  . sodium chloride  3 mL Intravenous Q12H  . sucralfate  1 g Oral BID  . vitamin C  500 mg Oral Daily   Assessment/Plan:  [name] is a 69 yo woman who is a current smoker who has been found to be in hypoxic respiratory failure with worsening of  her idiopathic interstitial pneumonia. The cause of her acute worsening is yet to be determined. Bronchoscopy or other investigatory measures have been postponed, and pulmonology believes her prognosis to be very poor. She is ready for discharge to a long term care facility.  Acute on Chronic Hypoxic Respiratory Failure: Etiology of exacerbating factors remains broad with considerations for hypersensitivity pneumonitis (exposure to dove and macrobid, idiopathic flare, non-specific interstitial pneumonia, desquamative interstitial pneumonia). Continuing treatment on antibiotics, steroids and lasix. Focus shift from monitors / O2 saturations to treating dyspnea, air hunger, anxiety, distress (using opiates and anxiolytics). Chlamydia antibodies, hypersensitivity pneumonia, repeat adolase pending. - Appreciate Palliative guidance - Appreciate PCCM recommendations - Pulmonary follow up with Dr. Chase Caller - IV steroids decreased yesterday to 80 mg  q12 hours --> will be transitioned to 50 mg PO daily tomorrow (3/17) with slow taper - Continue pantoprazole while on steroids - Extend IV Doxycycline for extra 7 days, will be a total of 21 (started 2/28-->continue through 3/19) - Discontinue lasix 40 mg IV q 12 hours; patient's CXR has improved - Continue to wean off oxygen as tolerated - Maintain foley for comfort - Discontinued telemetry - Discontinued continuous O2 monitoring - Continue nebs (duonebs q6 hours, pulmicort q6 hours and albuterol q2 hours PRN) - Robitussin-DM q6 hours PRN - Continue Tessalon Perles TID - Continue tylenol 500 mg PRN q 6 hours - Norco and anxiolytics for pain and anxiety - Needs Prevnar vaccine as outpatient - PT and OT- recs- SNF, patient amenable to [facility] - Repeat sputum culture pending (sample needs to be collected)  Mediastinal and Hilar Lymphadenopathy: ACE 95 in 2009-->50 in 02/2014. Not a candidate for biopsy.  Pulmonary Hypertension: Complicated by OSA, OHS. Patient refuses CPAP. Echo 2013 showed PAP 40-44. Investigating ILD. - Avoid hypoxia  Constipation and Hair Loss: TSH normal. Constipation may be secondary to opioids or iron supplementation. - Senokot-S 2 tablets BID PRN - Miralax daily PRN  GERD: Currently bothering the patient.  - Continue protonix 40 mg BID,   Chronic Iron Deficiency Anemia: Hemoglobin stable- 9.6. MCV 79.0. - Continue oral iron supplementation TID  Urge and Stress Incontinence: Macrobid has been stopped, as this has an association with pulmonary hypersensitivity. Patient had been on it for her chronic colonization because she was to receive a urodynamic study as an outpatient.  GERD: Currently bothering the patient; she says that milk has been helping her symptoms. - Continue home protonix 40 mg by mouth daily, pantoprazole 40 mg BID, sucralfate 1 g BID  Depression and Anxiety: Anxiety about health, son and isolation has been present on this admission. -  Continue home Ativan 1 mg by mouth in the am and 2 mg by mouth at night; added ativan 1 mg in the morning and 1 mg PRN for anxiety - Continue home Lamictal 200 mg every morning, Seroquel 400 mg at bedtime - Increased home Zoloft dosing 250 mg daily - Continue discussions about goals of care, wishes for dispo, appreciate Palliative input  Chronic Low Back Pain: Patient has not been using her PRN Norco. Pain is in good control. - Continue home Norco q6 hours PRN - Tylenol PRN  - Gabapentin 100 mg TID - Added fentanyl patch for pain    Tobacco Abuse: Current smoker.  - Nicotine patch in place  History of DVT and PE:  - Patient was managed on lifelong lovenox 100 mg BID --> switched to Eliquis in preparation for SNF 5 mg BID  Diet: Regular  Dispo: Disposition is deferred at this time, awaiting improvement of current medical problems. Pending placement.    LOS: 23 days   Jennifer Einstein, MD 04/05/2014, 9:43 AM

## 2014-04-06 DIAGNOSIS — J189 Pneumonia, unspecified organism: Secondary | ICD-10-CM | POA: Diagnosis not present

## 2014-04-06 DIAGNOSIS — I272 Other secondary pulmonary hypertension: Secondary | ICD-10-CM | POA: Diagnosis not present

## 2014-04-06 DIAGNOSIS — J8410 Pulmonary fibrosis, unspecified: Secondary | ICD-10-CM | POA: Diagnosis not present

## 2014-04-06 DIAGNOSIS — G4733 Obstructive sleep apnea (adult) (pediatric): Secondary | ICD-10-CM | POA: Diagnosis not present

## 2014-04-06 DIAGNOSIS — F329 Major depressive disorder, single episode, unspecified: Secondary | ICD-10-CM | POA: Diagnosis not present

## 2014-04-06 DIAGNOSIS — J9601 Acute respiratory failure with hypoxia: Secondary | ICD-10-CM | POA: Diagnosis not present

## 2014-04-06 DIAGNOSIS — Z86718 Personal history of other venous thrombosis and embolism: Secondary | ICD-10-CM | POA: Diagnosis not present

## 2014-04-06 DIAGNOSIS — E785 Hyperlipidemia, unspecified: Secondary | ICD-10-CM | POA: Diagnosis not present

## 2014-04-06 DIAGNOSIS — F3340 Major depressive disorder, recurrent, in remission, unspecified: Secondary | ICD-10-CM | POA: Diagnosis not present

## 2014-04-06 DIAGNOSIS — R279 Unspecified lack of coordination: Secondary | ICD-10-CM | POA: Diagnosis not present

## 2014-04-06 DIAGNOSIS — Z853 Personal history of malignant neoplasm of breast: Secondary | ICD-10-CM

## 2014-04-06 DIAGNOSIS — R0602 Shortness of breath: Secondary | ICD-10-CM | POA: Diagnosis not present

## 2014-04-06 DIAGNOSIS — R2689 Other abnormalities of gait and mobility: Secondary | ICD-10-CM | POA: Diagnosis not present

## 2014-04-06 DIAGNOSIS — K31819 Angiodysplasia of stomach and duodenum without bleeding: Secondary | ICD-10-CM | POA: Diagnosis not present

## 2014-04-06 DIAGNOSIS — M545 Low back pain: Secondary | ICD-10-CM | POA: Diagnosis not present

## 2014-04-06 DIAGNOSIS — A70 Chlamydia psittaci infections: Secondary | ICD-10-CM | POA: Diagnosis not present

## 2014-04-06 DIAGNOSIS — Z9981 Dependence on supplemental oxygen: Secondary | ICD-10-CM | POA: Diagnosis not present

## 2014-04-06 DIAGNOSIS — F419 Anxiety disorder, unspecified: Secondary | ICD-10-CM | POA: Diagnosis not present

## 2014-04-06 DIAGNOSIS — Z86711 Personal history of pulmonary embolism: Secondary | ICD-10-CM | POA: Diagnosis not present

## 2014-04-06 DIAGNOSIS — J849 Interstitial pulmonary disease, unspecified: Secondary | ICD-10-CM | POA: Diagnosis not present

## 2014-04-06 DIAGNOSIS — J9600 Acute respiratory failure, unspecified whether with hypoxia or hypercapnia: Secondary | ICD-10-CM | POA: Diagnosis not present

## 2014-04-06 DIAGNOSIS — I824Z2 Acute embolism and thrombosis of unspecified deep veins of left distal lower extremity: Secondary | ICD-10-CM | POA: Diagnosis not present

## 2014-04-06 DIAGNOSIS — M810 Age-related osteoporosis without current pathological fracture: Secondary | ICD-10-CM | POA: Diagnosis not present

## 2014-04-06 DIAGNOSIS — M6281 Muscle weakness (generalized): Secondary | ICD-10-CM | POA: Diagnosis not present

## 2014-04-06 LAB — ALDOLASE: ALDOLASE: 6.3

## 2014-04-06 MED ORDER — PREDNISONE 50 MG PO TABS
50.0000 mg | ORAL_TABLET | Freq: Every day | ORAL | Status: DC
  Administered 2014-04-06: 50 mg via ORAL
  Filled 2014-04-06: qty 1

## 2014-04-06 MED ORDER — IPRATROPIUM-ALBUTEROL 0.5-2.5 (3) MG/3ML IN SOLN: 3.0000 mL | Freq: Four times a day (QID) | RESPIRATORY_TRACT | Status: DC

## 2014-04-06 MED ORDER — APIXABAN 5 MG PO TABS: 5.0000 mg | ORAL_TABLET | Freq: Two times a day (BID) | ORAL | Status: DC

## 2014-04-06 MED ORDER — FENTANYL 12 MCG/HR TD PT72: 12.5000 ug | MEDICATED_PATCH | TRANSDERMAL | Status: DC

## 2014-04-06 MED ORDER — LORAZEPAM 2 MG PO TABS: 2.0000 mg | ORAL_TABLET | Freq: Every day | ORAL | Status: DC

## 2014-04-06 MED ORDER — DOXYCYCLINE HYCLATE 100 MG PO TABS: 100.0000 mg | ORAL_TABLET | Freq: Two times a day (BID) | ORAL | Status: DC

## 2014-04-06 MED ORDER — BUDESONIDE 0.25 MG/2ML IN SUSP: 0.2500 mg | Freq: Four times a day (QID) | RESPIRATORY_TRACT | Status: DC

## 2014-04-06 MED ORDER — PREDNISONE 50 MG PO TABS: ORAL_TABLET | ORAL | Status: DC

## 2014-04-06 MED ORDER — HYDROCODONE-ACETAMINOPHEN 10-325 MG PO TABS: 1.0000 | ORAL_TABLET | Freq: Four times a day (QID) | ORAL | Status: DC | PRN

## 2014-04-06 NOTE — Progress Notes (Signed)
Zollie Pee to be D/C'd to Skilled nursing facility: [facility] per MD order.  Discussed with the patient and all questions fully answered.  VSS, Skin clean, dry, intact with bruising to bilateral arms.  IV catheter discontinued intact. Site without signs and symptoms of complications. Dressing and pressure applied.  SNF packet put together by Social Work and placed in drawer outside patient's room to be sent with EMS.    [facility] transport set up by Education officer, museum. Awaiting transport. Report called to RN at [facility].  Micki Riley 04/06/2014 3:51 PM

## 2014-04-06 NOTE — Clinical Social Work Psychosocial (Addendum)
Clinical Social Work Department BRIEF PSYCHOSOCIAL ASSESSMENT 04/05/2014  Patient:  [name], [name]     Account Number:  [id number]     Admit date:  03/13/2014  Clinical Social Worker:  Dian Queen  Date/Time:  04/04/2014 05:46 PM  Referred by:  Physician  Date Referred:  04/04/2014 Referred for  SNF Placement   Other Referral:   Interview type:  Patient Other interview type:    PSYCHOSOCIAL DATA Living Status:  ALONE Admitted from facility:   Level of care:   Primary support name:  [name] Primary support relationship to patient:  CHILD, ADULT Degree of support available:   Patient's son works Health and safety inspector, so it is hard for him to provide support for her.    CURRENT CONCERNS Current Concerns  Post-Acute Placement   Other Concerns:    SOCIAL WORK ASSESSMENT / PLAN Patient is a 69 year old famale who lives by herself. Patient is alert and oriented x4.  Patient has depression and anxiety and is sometimes teary eyed when talking about her health.  Patient reports that she has decided that she wants to go to long term care because her medical conditions have become worse, and she does not feel like she can stay at home  by herself anymore.  Patient had a consult with palliative whom recommended long term care with hospice, which patient expressed understanding of the situation.  Patient is experiencing life changes and she is emotional about it, but expresses that she has thought about everything and has accepted what is happening to her. Patient has a friend who is supportive of her, and she has her son which he is in agreement to whatever she decides. Patient will be discharged to SNF for long term care once she is medically stable.   Assessment/plan status:  Psychosocial Support/Ongoing Assessment of Needs Other assessment/ plan:   Information/referral to community resources:    PATIENT'S/FAMILY'S RESPONSE TO PLAN OF CARE: Patient, family, and friend in  agreement to going to SNF for long term care.   [name], MSW, [place] 04/06/2014 5:56 PM

## 2014-04-06 NOTE — Clinical Social Work Note (Signed)
CSW received phone call from [facility] who reevaluated patient and are able to take her for discharge today.  CSW informed patient that [facility] is able to take her.  Patient was asked where facility was located, CSW informed her and she said the location is closer for her son to visit her, which she expressed she was happy about that.  Patient was given information about [facility], patient expressed gratitude and relief that the facility is in [place] and [place] out of county.  Patient was explained that the SNF was ordering some equipment which should be at facility after 2pm or 2:30pm today and SNF will inform CSW when they are ready for patient to arrive.  Jones Broom. [place], MSW, [place] 04/06/2014 11:29 AM

## 2014-04-06 NOTE — Progress Notes (Signed)
Subjective: [name] is very disappointed that she did not get set up at [facility].  Interval Events: - Evaluated and denied acceptance by both [facility] yesterday  Objective: Vital signs in last 24 hours: Filed Vitals:   04/05/14 2014 04/05/14 2242 04/06/14 0142 04/06/14 0516  BP:  162/49  132/46  Pulse: 82 71 72 77  Temp:  98.7 F (37.1 C)  98.3 F (36.8 C)  TempSrc:  Oral  Oral  Resp: 18 17 18 17   Height:      Weight:      SpO2:  94%  92%   Weight change:   Intake/Output Summary (Last 24 hours) at 04/06/14 0715 Last data filed at 04/05/14 2245  Gross per 24 hour  Intake    480 ml  Output   1050 ml  Net   -570 ml   Physical Exam: Appearance: Morbidly obese, lying in bed in NAD on 6 L O2 Rossiter through oxymizer (no longer on O2 monitor) HEENT: AT/[place], PERRL, EOMi Heart: RRR, no murmurs Lungs: slightly improved to have coarse, rhonchorous breathing b/l, but breathing comfortably and moving equal volumes of air, saturations drop when patient is anxious / upset Abdomen: BS+, obese, soft, nontender Extremities: No BLE edema, no tenderness Neurologic: A&Ox3  Lab Results: Basic Metabolic Panel:  Recent Labs Lab 03/31/14 0316  NA 140  K 3.8  CL 95*  CO2 38*  GLUCOSE 141*  BUN 18  CREATININE 0.69  CALCIUM 8.3*   CBC:  Recent Labs Lab 04/02/14 0450  WBC 13.1*  HGB 11.0*  HCT 38.4  MCV 79.5  PLT 308   Medications: I have reviewed the patient's current medications. Scheduled Meds: . antiseptic oral rinse  7 mL Mouth Rinse BID  . apixaban  5 mg Oral BID  . benzonatate  100 mg Oral TID  . budesonide  0.25 mg Nebulization 4 times per day  . doxycycline  100 mg Oral Q12H  . fentaNYL  12.5 mcg Transdermal Q72H  . ferrous sulfate  325 mg Oral TID WC  . gabapentin  100 mg Oral TID  . ipratropium-albuterol  3 mL Nebulization Q6H  . lamoTRIgine  200 mg Oral q morning - 10a  . LORazepam  1 mg Oral q morning - 10a  . LORazepam  2  mg Oral QHS  . methylPREDNISolone (SOLU-MEDROL) injection  80 mg Intravenous Q12H  . nicotine  14 mg Transdermal QHS  . pantoprazole  40 mg Oral BID AC  . pravastatin  40 mg Oral Daily  . QUEtiapine  400 mg Oral QHS  . senna-docusate  2 tablet Oral BID  . sertraline  250 mg Oral q morning - 10a  . sodium chloride  3 mL Intravenous Q12H  . sucralfate  1 g Oral BID  . 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 The cause of her acute worsening is yet to be determined. Bronchoscopy or other investigatory measures have been postponed, and pulmonology believes her prognosis to be very poor. She is ready for discharge to a long term care facility.  Acute on Chronic Hypoxic Respiratory Failure: Etiology of exacerbating factors remains broad with considerations for hypersensitivity pneumonitis (exposure to dove and macrobid, idiopathic flare, non-specific interstitial pneumonia, desquamative interstitial pneumonia). Continuing treatment on antibiotics, steroids  and lasix. Focus shift from monitors / O2 saturations to treating dyspnea, air hunger, anxiety, distress (using opiates and anxiolytics). Chlamydia antibodies WNL. Hypersensitivity pneumonia, repeat adolase pending. - Appreciate Palliative guidance - Appreciate PCCM recommendations - Pulmonary follow up with Dr. Chase Caller - 50 mg PO prednisone starting today (switched from IV solumedrol); with slow taper - Continue pantoprazole while on steroids - Extend IV Doxycycline for extra 7 days, will be a total of 21 (started 2/28-->continue through 3/19) - Discontinue lasix 40 mg IV q 12 hours; patient's CXR has improved - Continue to wean off oxygen as tolerated - Maintain foley for comfort - Discontinued telemetry - Discontinued continuous O2 monitoring - Continue nebs (duonebs  q6 hours, pulmicort q6 hours and albuterol q2 hours PRN) - Robitussin-DM q6 hours PRN - Continue Tessalon Perles TID - Continue tylenol 500 mg PRN q 6 hours - Norco and anxiolytics for pain and anxiety - Needs Prevnar vaccine as outpatient - PT and OT- recs- SNF - Repeat sputum culture pending (sample needs to be collected)  Mediastinal and Hilar Lymphadenopathy: ACE 95 in 2009-->50 in 02/2014. Not a candidate for biopsy.  Pulmonary Hypertension: Complicated by OSA, OHS. Patient refuses CPAP. Echo 2013 showed PAP 40-44. Investigating ILD. - Avoid hypoxia  Constipation and Hair Loss: TSH normal. Constipation may be secondary to opioids or iron supplementation. - Senokot-S 2 tablets BID PRN - Miralax daily PRN  Chronic Iron Deficiency Anemia: Last hemoglobin stable- 11.0. MCV 79.5. - Continue oral iron supplementation TID  Urge and Stress Incontinence: Macrobid has been stopped, as this has an association with pulmonary hypersensitivity. Patient had been on it for her chronic colonization because she was to receive a urodynamic study as an outpatient.  GERD: Currently bothering the patient; she says that milk has been helping her symptoms. - Continue home protonix 40 mg by mouth daily, pantoprazole 40 mg BID, sucralfate 1 g BID  Depression and Anxiety: Anxiety about health, son and isolation has been present on this admission. - Continue home Ativan 1 mg by mouth in the am and 2 mg by mouth at night; added ativan 1 mg in the morning and 1 mg PRN for anxiety - Continue home Lamictal 200 mg every morning, Seroquel 400 mg at bedtime - Increased home Zoloft dosing 250 mg daily - Continue discussions about goals of care, wishes for dispo, appreciate Palliative input  Chronic Low Back Pain: Patient has not been using her PRN Norco. Pain is in good control. - Continue home Norco q6 hours PRN - Tylenol PRN  - Gabapentin 100 mg TID - Added fentanyl patch for pain    Tobacco Abuse: Current  smoker.  - Nicotine patch in place  History of DVT and PE:  - Patient was managed on lifelong lovenox 100 mg BID --> switched to Eliquis in preparation for SNF at 5 mg BID  Diet: Regular  Dispo: Disposition is deferred at this time, awaiting improvement of current medical problems. Pending placement.    LOS: 24 days   Karlene Einstein, MD 04/06/2014, 7:15 AM

## 2014-04-06 NOTE — Clinical Social Work Note (Signed)
Patient to be d/c'ed today to [facility].  Patient and family agreeable to plans will transport via ems RN to call report.  Evette Cristal, MSW, [place]

## 2014-04-06 NOTE — Discharge Instructions (Signed)

## 2014-04-06 NOTE — Clinical Social Work Placement (Signed)
Clinical Social Work Department CLINICAL SOCIAL WORK PLACEMENT NOTE 04/06/2014  Patient:  [name], [name]  Account Number:  [id number] Admit date:  03/13/2014  Clinical Social Worker:  Rhiley Solem, LCSWA  Date/time:  04/05/2014 05:57 PM  Clinical Social Work is seeking post-discharge placement for this patient at the following level of care:   SKILLED NURSING   (*CSW will update this form in Epic as items are completed)   04/05/2014  Patient/family provided with [facility] Department of Clinical Social Work's list of facilities offering this level of care within the geographic area requested by the patient (or if unable, by the patient's family).  04/05/2014  Patient/family informed of their freedom to choose among providers that offer the needed level of care, that participate in Medicare, Medicaid or managed care program needed by the patient, have an available bed and are willing to accept the patient.  04/05/2014  Patient/family informed of [facility]' ownership interest in [facility], as well as of the fact that they are under no obligation to receive care at this facility.  PASARR submitted to EDS on 04/05/2014 PASARR number received on 04/05/2014  FL2 transmitted to all facilities in geographic area requested by pt/family on  04/05/2014 FL2 transmitted to all facilities within larger geographic area on 04/05/2014  Patient informed that his/her managed care company has contracts with or will negotiate with  certain facilities, including the following:     Patient/family informed of bed offers received:  04/05/2014 Patient chooses bed at [facility] Physician recommends and patient chooses bed at    Patient to be transferred to [facility] on  04/06/2014 Patient to be transferred to facility by [facility] Patient and family notified of transfer on 04/06/2014 Name of family member notified:  Beryle Lathe  patient's friend  The following physician request were entered in Epic:   Additional Comments:  Jones Broom. [place], MSW, [place] 04/06/2014 6:00 PM

## 2014-04-06 NOTE — Progress Notes (Signed)
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   Morbid obesity with BMI of 50.0-59.9, adult   Gastric antral vascular ectasia   Hyperlipidemia LDL goal <130   Anxiety   Gastroesophageal reflux disease   Osteoporosis   CAP (community acquired pneumonia)   Anemia, iron deficiency   Pneumonitis, hypersensitivity, avian   Pneumonitis   Vitamin D deficiency   Absolute anemia   Pulmonary infiltrates   Respiratory failure   Dyspnea   ILD (interstitial lung disease)   Psittacosis      Code Status Orders        Start     Ordered   03/31/14 1420  Do not attempt resuscitation (DNR)   Continuous    Question Answer Comment  In the event of cardiac or respiratory ARREST Do not call a "code blue"   In the event of cardiac or respiratory ARREST Do not perform Intubation, CPR, defibrillation or ACLS   In the event of cardiac or respiratory ARREST Use medication by any route, position, wound care, and other measures to relive pain and suffering. May use oxygen, suction and manual treatment of airway obstruction as needed for comfort.      03/31/14 1419      Length of Stay (days):24   SUBJECTIVE/24 HOUR EVENTS: 69 y.o. female on day 24 of her hospital stay for hypoxic respiratory failure due to ideopathic interstitial lung disease. Patient on 6L nasal canula (Oxymizer) with O2 sats in the 90s. She has been approved for transfer to [facility] today. Patient is extremely happy and excited with her placement at [facility] and the fact that it is closer to her son. Plan for discharge today on Oxymizer.    OBJECTIVE: Filed Vitals:   04/06/14 0142 04/06/14 0516 04/06/14 0829 04/06/14 1258  BP:  132/46 119/38 135/48  Pulse: 72 77 72 70  Temp:  98.3 F (36.8 C) 97.9 F  (36.6 C) 97.8 F (36.6 C)  TempSrc:  Oral Axillary Oral  Resp: 18 17 18 18   Height:      Weight:      SpO2:  92% 92% 97%    Intake/Output Summary (Last 24 hours) at 04/06/14 1328 Last data filed at 04/06/14 0849  Gross per 24 hour  Intake    600 ml  Output   1150 ml  Net   -550 ml    Intake/Output last 3 shifts: I/O last 3 completed shifts: In: 480 [P.O.:480] Out: 1050 [Urine:1050]  Allergies  Allergen Reactions  . Adhesive [Tape] Other (See Comments)    "I break out all over"; Can only use paper tape  . Codeine Other (See Comments)    "first dose I took, my aide found me the next day laying on the floor"  . Morphine Other (See Comments)    caused change in mental status, she does not want to try it again.  . Sulfonamide Derivatives Nausea And Vomiting  . Thorazine [Chlorpromazine] Nausea Only    Medications: Scheduled Meds: . antiseptic oral rinse  7 mL Mouth Rinse BID  . apixaban  5 mg Oral BID  . benzonatate  100 mg Oral TID  . budesonide  0.25 mg Nebulization 4 times per day  . doxycycline  100 mg Oral Q12H  . fentaNYL  12.5 mcg Transdermal Q72H  . ferrous sulfate  325 mg Oral TID WC  . gabapentin  100 mg Oral TID  . ipratropium-albuterol  3 mL Nebulization Q6H  . lamoTRIgine  200 mg Oral q morning - 10a  . LORazepam  1 mg Oral q morning - 10a  . LORazepam  2 mg Oral QHS  . nicotine  14 mg Transdermal QHS  . pantoprazole  40 mg Oral BID AC  . pravastatin  40 mg Oral Daily  . predniSONE  50 mg Oral Q breakfast  . QUEtiapine  400 mg Oral QHS  . senna-docusate  2 tablet Oral BID  . sertraline  250 mg Oral q morning - 10a  . sodium chloride  3 mL Intravenous Q12H  . sucralfate  1 g Oral BID  . vitamin C  500 mg Oral Daily   Continuous Infusions:  PRN Meds:.acetaminophen, albuterol, HYDROcodone-acetaminophen, LORazepam, polyethylene glycol  Physical Exam: GEN: AAOx4, in much better spirits. HEENT: MMM, EOMI, PERRLA, b/l sclera anicteric, no conjunctival  injection CV: RRR, S1S2nl, no murmurs/rubs/gallops PULM: coarse, rhonchrous breathing bilaterally. End-expiratory wheezes bilaterally. No change  ABD: soft, ND/NT, no rebound, no guarding, normal/active bowel sounds  EXT: no cyanosis, clubbing or edema  NEURO: no focal deficits  PSYCH: nl affect, nl speech   Labs: No results for input(s): ADJUSTEDWBC, HGB, HCT, PLT, NA, K, CL, CO2, BUN, CREATININE, GLU, CALCIUM, PHOS in the last 72 hours.  Invalid input(s): MAG  CBC Latest Ref Rng 04/02/2014 03/30/2014 03/26/2014  WBC 4.0 - 10.5 K/uL 13.1(H) 11.7(H) 13.4(H)  Hemoglobin 12.0 - 15.0 g/dL 11.0(L) 9.9(L) 9.6(L)  Hematocrit 36.0 - 46.0 % 38.4 34.2(L) 33.8(L)  Platelets 150 - 400 K/uL 308 314 400    BMP Latest Ref Rng 03/31/2014 03/30/2014 03/29/2014  Glucose 70 - 99 mg/dL 141(H) 128(H) 188(H)  BUN 6 - 23 mg/dL 18 22 21   Creatinine 0.50 - 1.10 mg/dL 0.69 0.64 0.74  Sodium 135 - 145 mmol/L 140 138 138  Potassium 3.5 - 5.1 mmol/L 3.8 3.5 4.0  Chloride 96 - 112 mmol/L 95(L) 99 95(L)  CO2 19 - 32 mmol/L 38(H) 33(H) 36(H)  Calcium 8.4 - 10.5 mg/dL 8.3(L) 8.4 8.4    CMP Latest Ref Rng 03/31/2014 03/30/2014 03/29/2014  Glucose 70 - 99 mg/dL 141(H) 128(H) 188(H)  BUN 6 - 23 mg/dL 18 22 21   Creatinine 0.50 - 1.10 mg/dL 0.69 0.64 0.74  Sodium 135 - 145 mmol/L 140 138 138  Potassium 3.5 - 5.1 mmol/L 3.8 3.5 4.0  Chloride 96 - 112 mmol/L 95(L) 99 95(L)  CO2 19 - 32 mmol/L 38(H) 33(H) 36(H)  Calcium 8.4 - 10.5 mg/dL 8.3(L) 8.4 8.4  Total Protein 6.0 - 8.3 g/dL - - -  Total Bilirubin 0.3 - 1.2 mg/dL - - -  Alkaline Phos 39 - 117 U/L - - -  AST 0 - 37 U/L - - -  ALT 0 - 35 U/L - - -    Images: Last chest x-ray 04/04/2014.   Medications, Vitals, Labs, and Images reviewed.   ASSESSMENT AND PLAN: 69 y.o. Female being discharged today to [facility]. PMH significant for pulmonary hypertension, OSA, OHS, history of DVT and PE, depression, anxiety and smoking. Patient has spent 24 days in the  hospital for hypoxic respiratory failure with idiopathic interstitial lung disease. O2 sats in the 90s on 6L Oxymizer nasal canula.   Hypoxic respiratory failure: The differential remains broad for the cause of her ideopathic interstitial lung disease and  current symptoms. Possible diagnoses include but are not limited to hypersensitivity pneumonitis (possibly from [place] Fancier's lung due to prior exposure to pet bird - chlamydia, psittacosis antibody screen negative) - however, her lack of improvement on steroids makes this less likely, broncheolitis obliterans organizing pneumonia, pulmonary alveolar proteinosis, eosinophilic pathologies such as eosinophilic granuloma or acute pneumonia, and acute interstitial pneumonia. Patient is currently being treated for an atypical pneumonia with doxycycline 169m bid (she is currently on her 17th day of treatment) and steroids. Sjogren's A and B antibody negative. Anti-scl antibody negative. CK and CKMB w/in normal limits. Aldolase, cyclic citrul peptide antibody IgG, hypersensitivity pneumonitis antibody pending. Crypto antigen negative, antiphospholipid syndrome negative except for phosphatydalserine IgM. Procalcitonin < 0.10. RF elevated (17.3). ESR decreased from 68 to 28. ANCA WNL. Mpo/pr-3 WNL. TSH WNL. Creatinine 0.64.  - IV doxycyline 1074mbid (continue for another 2 days per pulmonary's recommendation) - Discontinue IV Solumedrol 80 mg q12. Switch to 5094mO prednisone daily. Slow taper  - Discontinue Lasix 51m22m q12. Monitor for increased fluid retention.  - Continue to wean off O2 as tolerated.  - continue nebs (duonebs q6 hrs, pulmicort q6 hrs and albuterol q2 hrs PRN) - Robitussin DM q6 hrs PRN - Continue Tessalon Perles TID - Continue tylenol 500 mg PRN q 6 hours - Needs Prevnar vaccine as outpatient - no longer pursuing  goal of BAL or biopsy given that it would not change treatment.  - repeat sputum culture pending  - discontinued telemetry  - maintain foley for comfort   Pulmonary Hypertension: Complicated by OSA, OHS. Patient refuses CPAP. Echo 2013 showed PAP 40-44. Investigating ILD. - Avoid hypoxia  Mediastinal and Hilar Lymphadenopathy: ACE 95 in 2009-->50 in 02/2014. Not a candidate for biopsy  Constipation and Hair Loss: TSH normal. Constipation may be secondary to opioids or iron supplementation. - Senokot-S BID PRN - Miralax daily  Chronic Iron Deficiency Anemia: Hemoglobin stable- 11.0. MCV 79.5. - Continue oral iron supplementation TID  Urge and Stress Incontinence: Macrobid has been stopped, as this has an association with pulmonary hypersensitivity. Patient had been on it for her chronic colonization because she was to receive a urodynamic study as an outpatient.  GERD:  - Continue Protonix 40 mg by mouth daily.   Chronic Iron Deficiency Anemia: Hemoglobin stable- 9.6. MCV 79.0. - Continue oral iron supplementation TID  Depression and Anxiety: Patient not as emotional/upset today. Seems to be better understanding and excepting of her prognosis given an unknown cause to her diagnosis.  - Continue Ativan 1 mg in the morning, 2mg 22mnight. 1mg P63mfor anxiety.  - Continue Lamictal 200mg e80m morning, Seroquel 400mg at92mtime and Zoloft 250mg dai45m  Chronic Low Back Pain: Patient has not been using her PRN Norco. Pain is in good control. - Continue home Norco q6 hours PRN - Tylenol PRN  - Gabapentin 100 mg TID - Added fentanyl patch for pain  Tobacco Abuse: Current smoker. Nicotine patch in place.  Vitamin D Deficiency: 15 - Started ergo 50 U weekly  History of DVT and PE:  - Long term care will not accept patient on lovenox 100 mg BID for lifelong prophylaxis (possibily due to costs). Switch to Eliquis 5mg BID. 62miet:  Regular   Dispo: Placement at [facility] long term care facility.

## 2014-04-06 NOTE — Discharge Summary (Signed)
Name: [name] MRN: [id number] DOB: 1945/12/16 69 y.o. 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   Morbid obesity with BMI of 50.0-59.9, adult   Gastric antral vascular ectasia   Hyperlipidemia LDL goal <130   Anxiety   Gastroesophageal reflux disease   Osteoporosis   CAP (community acquired pneumonia)   Anemia, iron deficiency   Pneumonitis, hypersensitivity, avian   Pneumonitis   Vitamin D deficiency   Absolute anemia   Pulmonary infiltrates   Respiratory failure   Dyspnea   ILD (interstitial lung disease)   Psittacosis  Discharge Medications:   Medication List    STOP taking these medications        enoxaparin 100 MG/ML injection  Commonly known as:  LOVENOX     nitrofurantoin (macrocrystal-monohydrate) 100 MG capsule  Commonly known as:  MACROBID      TAKE these medications        albuterol (2.5 MG/3ML) 0.083% nebulizer solution  Commonly known as:  PROVENTIL  Take 3 mLs (2.5 mg total) by nebulization every 6 (six) hours as needed for shortness of breath.     alendronate 70 MG tablet  Commonly known as:  FOSAMAX  Take 1 tablet (70 mg total) by mouth every 7 (seven) days. Take with a full glass of water on an empty stomach.     apixaban 5 MG Tabs tablet  Commonly known as:  ELIQUIS  Take 1 tablet (5 mg total) by mouth 2 (two) times daily.     budesonide 0.25 MG/2ML nebulizer solution  Commonly known as:  PULMICORT  Take 2 mLs (0.25 mg total) by nebulization every 6 (six) hours.     calcium citrate-vitamin D 315-200 MG-UNIT per tablet  Commonly known as:   CITRACAL+D  Take 2 tablets by mouth 2 (two) times daily.     doxycycline 100 MG tablet  Commonly known as:  VIBRA-TABS  Take 1 tablet (100 mg total) by mouth every 12 (twelve) hours.     ferrous sulfate 325 (65 FE) MG EC tablet  Take 325 mg by mouth 3 (three) times daily with meals.     HYDROcodone-acetaminophen 10-325 MG per tablet  Commonly known as:  NORCO  Take 1-2 tablets by mouth every 6 (six) hours as needed for moderate pain.     ipratropium-albuterol 0.5-2.5 (3) MG/3ML Soln  Commonly known as:  DUONEB  Take 3 mLs by nebulization every 6 (six) hours.     lamoTRIgine 200 MG tablet  Commonly known as:  LAMICTAL  Take 200 mg by mouth every morning.     LORazepam 1 MG tablet  Commonly known as:  ATIVAN  Take 1-2 mg by mouth 2 (two) times daily. Takes 1 tablet in morning and 2 tablets at bedtime     NEURONTIN 100 MG capsule  Generic drug:  gabapentin  Take 100 mg by mouth 3 (three) times daily.     pantoprazole 40 MG tablet  Commonly known as:  PROTONIX  Take 1 tablet (40 mg total) by mouth daily.     pravastatin 80 MG tablet  Commonly known as:  PRAVACHOL  Take 1 tablet (80 mg total) by mouth daily.     predniSONE 50 MG tablet  Commonly known as:  DELTASONE  Taper by 10mg  as appropriate, per SNF physician.     SEROQUEL 200 MG tablet  Generic drug:  QUEtiapine  Take 400 mg by mouth at bedtime.     vitamin C 500 MG tablet  Commonly known as:  ASCORBIC ACID  Take 1 tablet (500 mg total) by mouth daily.     ZOLOFT 100 MG tablet  Generic drug:  sertraline  Take 200 mg by mouth every morning.      ASK your doctor about these medications        Potassium Chloride ER 20 MEQ Tbcr  Take 80 mEq by mouth daily.        Follow-up Appointments: Follow-up Information    Follow up with [facility].   Why:  HHRN, PT, OT, SW   Contact information:   [address] [place] [address] [phone]       Consultations: Treatment  Team:  Palliative [facility] and critical care.  Procedures Performed:  Dg Chest 2 View  03/24/2014   CLINICAL DATA:  Congestion  EXAM: CHEST  2 VIEW  COMPARISON:  03/22/2014  FINDINGS: Cardiomegaly is again identified. Diffuse bilateral infiltrates are again identified although some improved aeration in the left midlung is noted. No new focal abnormality is seen. Postsurgical changes in the cervical spine are noted.  IMPRESSION: Slight improved aeration on the left. Diffuse bilateral infiltrates remain.   Electronically Signed   By: Inez Catalina M.D.   On: 03/24/2014 09:56   Dg Chest 2 View  03/13/2014   CLINICAL DATA:  69 year old female with productive cough and fever for 3 days. Weakness and severe shortness of Breath. Initial encounter.  EXAM: CHEST  2 VIEW  COMPARISON:  Chest CT 02/01/2013 and earlier.  FINDINGS: Large body habitus.  Semi upright AP and lateral views of the chest.  Changes of peripheral pulmonary fibrosis demonstrated by CT in 2015.  Since that time, progressive bilateral pulmonary opacity, mostly interstitial but confluent at the left hilum. Possible small pleural effusions. Stable cardiomegaly and mediastinal contours. No pneumothorax. Partially visible cervical ACDF hardware. No acute osseous abnormality identified.  IMPRESSION: Increased bilateral pulmonary opacity since 2015 favored to reflect acute pneumonia superimposed on chronic pulmonary fibrosis. Possible small pleural effusions.   Electronically Signed   By: Genevie Ann M.D.   On: 03/13/2014 17:09   Ct Chest W Contrast  03/17/2014   CLINICAL DATA:  69 year old female admitted on March 13, 2014 to the hospital with cough, dyspnea and hypoxia, with clinical evidence of community acquired pneumonia.  EXAM: CT CHEST WITH CONTRAST  TECHNIQUE: Multidetector CT imaging of the chest was performed during intravenous contrast administration.  CONTRAST:  34mL OMNIPAQUE IOHEXOL 300 MG/ML  SOLN  COMPARISON:  Chest CT  02/01/2013.  FINDINGS: Mediastinum/Lymph Nodes: Numerous enlarged mediastinal and hilar lymph nodes are noted, most of which appear to enhance more avidly than typically seen. The largest of these include a 2 cm short axis prevascular lymph node, 1.6 cm short axis high right paratracheal lymph node, 1.9 cm short axis subcarinal lymph node, and 1.7 cm left hilar lymph node. Extent of lymphadenopathy is slightly increased compared to prior examination 02/01/2013. Heart size is borderline enlarged. There is no significant pericardial fluid, thickening or pericardial calcification. Esophagus is unremarkable in appearance. No axillary lymphadenopathy.  Lungs/Pleura: Widespread interlobular septal  thickening in the lungs bilaterally, with widespread thickening of the peribronchovascular interstitium. In addition, there are patchy areas of ground-glass attenuation with both inter- and intralobular septal thickening, demonstrating a "crazy paving" appearance. These findings in particular are asymmetric, with greater involvement in the right lung than the left, and greater involvement in the apices relative to the bases. A few thick walled cysts are also noted in the upper lungs. Areas of dependent subsegmental atelectasis are noted in the lower lobes of the lungs bilaterally. Trace right pleural effusion.  Upper Abdomen: Calcified gallstone in the gallbladder. Spleen is incompletely visualized, but appears enlarged measuring up to 17.8 x 4.6 cm on axial images.  Musculoskeletal/Soft Tissues: Postoperative changes of bilateral subpectoral breast implants noted. The left breast implant is completely collapsed underneath the left pectoralis major musculature. There are no aggressive appearing lytic or blastic lesions noted in the visualized portions of the skeleton. Orthopedic fixation hardware in the lower cervical spine incidentally noted.  IMPRESSION: 1. The appearance of the chest is very unusual, as detailed above. While  there is definite evidence of interstitial lung disease, which appears progressive compared to the prior examination, how much of today's findings is related to an acute process versus chronic interstitial lung disease is uncertain. The pattern of interstitial lung findings is favored to reflect a chronic hypersensitivity pneumonitis. The possibility of superimposed acute interstitial pneumonia (AIP) is not excluded. Some of the findings on today's examination could simply be related to pulmonary edema, however, given the asymmetry of findings (right greater than left and apical greater than basal), superimposed atypical infection is also not excluded. A followup nonemergent high-resolution chest CT is recommended in the next several months after resolution of the patient's acute illness to better evaluate the interstitial lung findings. 2. Progressively increasing adenopathy in the thorax, and worsening splenomegaly. This could suggest underlying lymphoproliferative disorder. Notably, the mediastinal adenopathy is also remarkable for relatively avid enhancement of the lymph nodes. This is of uncertain etiology and significance, but can be seen in the setting of Castleman's disease. Although that is not a strongly favored diagnosis, clinical correlation is suggested. 3. Additional incidental findings, as above.   Electronically Signed   By: Vinnie Langton M.D.   On: 03/17/2014 14:12   Dg Chest Port 1 View  04/04/2014   CLINICAL DATA:  Shortness of breath.  EXAM: PORTABLE CHEST - 1 VIEW  COMPARISON:  03/31/2014.  FINDINGS: The diffuse bilateral airspace opacity is again demonstrated, with some improvement. No pleural fluid seen. The cardiac silhouette remains mildly enlarged with a poor inspiration noted. Cervical spine fixation hardware.  IMPRESSION: Mildly improved diffuse bilateral pneumonia or alveolar pulmonary edema.   Electronically Signed   By: Claudie Revering M.D.   On: 04/04/2014 14:57   Dg Chest Port 1  View  03/31/2014   CLINICAL DATA:  Interstitial lung disease.  EXAM: PORTABLE CHEST - 1 VIEW  COMPARISON:  03/30/2014 .  03/22/2014.  01/21/2013.  FINDINGS: Stable cardiomegaly. Diffuse dense bilateral pulmonary pulmonary infiltrates. No pleural effusion or pneumothorax. Prior cervical spine fusion.  IMPRESSION: Diffuse dense unchanged bilateral pulmonary infiltrates.   Electronically Signed   By: [place]   On: 03/31/2014 07:52   Dg Chest Port 1 View  03/30/2014   CLINICAL DATA:  Pneumonitis.  EXAM: PORTABLE CHEST - 1 VIEW  COMPARISON:  03/29/2014.  FINDINGS: Mediastinum and hilar structures are stable. Stable cardiomegaly. Stable diffuse severe pulmonary infiltrates. No pleural effusion or pneumothorax. Prior cervical spine fusion.  IMPRESSION: Persistent diffuse dense bilateral pulmonary infiltrates. No change from prior exam.   Electronically Signed   By: [place]   On: 03/30/2014 07:22   Dg Chest Port 1 View  03/29/2014   CLINICAL DATA:  Increased oxygen demands. Persistent coughing and congestion.  EXAM: PORTABLE CHEST - 1 VIEW  COMPARISON:  03/28/2014  FINDINGS: Persistent diffuse asymmetric airspace process which may be slightly progressive when compared to the prior chest x-ray. No pleural effusions or pneumothorax.  IMPRESSION: Slight progression of diffuse asymmetric airspace process.   Electronically Signed   By: Marijo Sanes M.D.   On: 03/29/2014 15:12   Dg Chest Port 1 View  03/28/2014   CLINICAL DATA:  Pneumonia, shortness of breath  EXAM: PORTABLE CHEST - 1 VIEW  COMPARISON:  Portable chest x-ray of 03/26/2016  FINDINGS: There is little change to very minimal improvement in diffuse airspace disease right greater than left most consistent with pneumonia. Superimposed edema cannot be excluded. No definite effusion is seen. Cardiomegaly is stable.  IMPRESSION: Little change to minimal improvement in airspace disease right greater than left.   Electronically Signed   By: Ivar Drape M.D.   On: 03/28/2014 07:20   Dg Chest Port 1 View  03/27/2014   CLINICAL DATA:  Respiratory failure.  EXAM: PORTABLE CHEST - 1 VIEW  COMPARISON:  03/24/2014.  03/20/2014.  FINDINGS: Cardiomegaly with diffuse bilateral pulmonary infiltrates are noted. These are unchanged. Stable cardiomegaly. No pleural effusion or pneumothorax. Prior cervical spine fusion.  IMPRESSION: Persistent dense bilateral pulmonary infiltrates, no change from prior exams.   Electronically Signed   By: Dana   On: 03/27/2014 07:39   Dg Chest Port 1 View  03/22/2014   CLINICAL DATA:  Hypoxia.  EXAM: PORTABLE CHEST - 1 VIEW  COMPARISON:  03/20/2014.  FINDINGS: Mediastinum and hilar structures normal. Stable cardiomegaly. Persistent dense consolidation of both lungs. No pleural effusion or pneumothorax. Prior cervical spine fusion.  IMPRESSION: 1. Persistent dense consolidation of both lungs. No interim clearing. 2. Persistent cardiomegaly.   Electronically Signed   By: Marcello Moores  Register   On: 03/22/2014 07:11   Dg Chest Port 1 View  03/20/2014   CLINICAL DATA:  Hypoxia  EXAM: PORTABLE CHEST - 1 VIEW  COMPARISON:  03/13/2014  FINDINGS: Progressive diffuse opacification of the lungs. These changes obscure underlying interstitial lung disease, noted on 02/01/2013 chest CT, with fibrotic change.  There is unchanged cardiomegaly. Upper mediastinal widening which is likely technical, but there is adenopathy based on recent chest CT.  IMPRESSION: Progressive diffuse lung opacification, likely pneumonia or edema superimposed on interstitial lung disease.   Electronically Signed   By: Monte Fantasia M.D.   On: 03/20/2014 06:23    Admission HPI:  Ms. 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  She endorses coughing, sneezing, wheezing, and SOB for last 2 weeks with worsening symptoms and today she was scared and decided to come to the ED. Her sputum was initially white but then now turned brown. No CP, but has chills and thinks she has had fevers as well. She has run out of her nebulizer treatments but usually uses it twice ine one week. She denies any sick contact or travel. She has received the flu vaccine.   She  was on o2 at home 2 years ago but not recently. Doesn't use CPAP because she finds it uncomfortable.  Saw Dr. Gaynelle Arabian for mixed urinary incontinence last week and is supposed to get urodynamic studies done. Was put on macrobid for 10 days (likely starting on Wed) then is to transition to 1 tablet daily for 30 days. We cannot find the results. Denies any dysuria but she states she doesn't really feel when she has to urinate. Denies any bleeding or hemoptysis/hematemesis, but says her aid has noticed that her stool has gotten darker brown over the past few days. She is non-compliant with her iron supplements and continues to smoke 1/2 ppd.   Got duoneb, levaquin 1x, and zofran in ED. Feels better after the inhaler treatment.   Hospital Course by problem list:   Ms. Blouch is a 70 yo woman who is a current smoker who has been found to be in hypoxic respiratory failure with worsening of her idiopathic interstitial lung disease. The cause of her acute worsening is yet to be determined. Bronchoscopy or other investigatory measures have been postponed due to the risks of the procedures and the potential that any gleaned information from them would likely not change treatment or prognosis. The pulmonology team believes her prognosis to be very poor.   Acute on Chronic Hypoxic Respiratory Failure: She presented with one-week of subjective fevers and chills along with progressive dyspnea and cough productive of brown-tinged sputum, and she was found to be hypoxic down to 86% on room air.  On chest x-ray, she was noted to have worsening bilateral interstitial infiltrates, which were initially concerning for an atypical community acquired pneumonia. She was started on ceftriaxone and azithromycin with only mild improvement over the first several days of hospitalization. As a result, chest CT was performed which showed evidence of progressive interstitial lung disease, which was favored to represent a chronic hypersensitivity pneumonitis- likely due to exposure to dove and macrobid. Also considered was idiopathic flare, non-specific interstitial pneumonia, desquamative interstitial pneumonia). The patient's macrobid was stopped, her dove removed from her home, and she received treatment with doxycycline (21 day course, nearly completed by discharge), steroids (Initaiily prednisone 40mg  daily- started 03/18/2014, then switched to [facility] 03/20/2014 at 40mg  Q8H and then to at 125mg  BID and at 80mg  BID till 03/26/2014 ) and lasix (at varying doses) while in the hospital. Her serial xrays showed little improvement despite these measures. There was discussion with the pulmonary team for a bronchoscopy with BAL; however, it was deemed too risky and without significant benefit. The patient was vehement about not wanting the procedure including a lung biopsy, if it would make her vent-dependent. Eventually, the Palliative care team was consulted and they helped her to discuss code status openly. She decided to change her code status from full code to DNR. Her son, [name], was involved in the subsequent Palliative Care meeting. Once goals of care were discussed, the Palliative team helped to facilitate a shift from monitors / O2 saturations to treating her symptoms (dyspnea, air hunger, anxiety, distress by using opiates and anxiolytics. The patient will complete a prednisone taper (left the hospital on 50 mg PO prednisone), 2 more days of doxycycline (to end on 3/19) and lasix as needed. She can follow up  with Dr. Chase Caller (pulmonology) and Dr. Eppie Gibson will remain her PCP. A foley will continue to be maintained for comfort as will the nebulizer and oxymizer (currently at 6L by [place]). She required Robitussin-DM q6 hours PRN and  Tessalon Perles TID along with tylenol 500 mg PRN q 6 hours. On discharge though an exact etiology of her interstitial lung disease was not identified, it was believed this was likely a progression of her interstitial lung disease which was already evident on Ct scans dating back to 2010, as patient failed to improved on prolonged course of IV steriods.  Mediastinal and Hilar Lymphadenopathy: She has had chronic mediastinal and hilar lymphadenopathy dating back to 2011. This could be related to her interstitial lung disease, or may represent a different process. ACE 95 in 2009-->50 in 02/2014, but she is not a candidate for biopsy.  Pulmonary Hypertension: Complicated by OSA, OHS. Patient refuses CPAP. Echo 2013 showed PAP 40-44.   Constipation and Hair Loss: TSH normal. Constipation may be secondary to opioids or iron supplementation. Managed on senokot-S 2 tabs BID PRN and miralax daily PRN.  Chronic Iron Deficiency Anemia: She has a history of iron deficiency anemia, and she previously been taking iron supplementation. However, she reported not keeping up with her iron supplementation prior to presentation. Her hemoglobin was found to be 9.8 on presentation down from her baseline of 14 one month ago. It stabilized to 11.0. She was found to be FOBT positive, but she did not have any signs of overt bleeding. Her iron supplementation was initially held in the setting of infection, but she was restarted on oral iron supplementation. Her hemoglobin remained stable throughout the admission. It has been over 10 years since her previous colonoscopy.  Urge and Stress Incontinence: She has a history of incontinence, and she is being worked up by [facility] urology for urge versus stress  incontinence. She was scheduled for a urodynamic study in 1 month. At her last appointment, she was noted to have a UTI, and she was treated with Macrobid. She was told to continue Macrobid daily for 30 days prior to her urodynamic study to suppress bacterial growth that could lead to a false positive study. Macrobid was initially held while she is being treated with ceftriaxone and azithromycin. She received 1 dose after ceftriaxone and azithromycin were stopped, but Macrobid was discontinued after she decompensated due to concern that Macrobid could cause hypersensitivity pneumonitis.   GERD: Currently bothering the patient; she says that milk has been helping her symptoms. To continue home protonix 40 mg by mouth daily, pantoprazole 40 mg BID, sucralfate 1 g BID.  Depression and Anxiety: Anxiety about health, son and isolation has been present on this admission. This was managed on home Ativan 1 mg by mouth in the am and 2 mg by mouth at night and additional ativan 1 mg in the morning and 1 mg PRN for anxiety. She also continued her home Lamictal 200 mg every morning, Seroquel 400 mg at bedtime. Her home Zoloft dosing was increased to 250 mg daily.  Chronic Low Back Pain: Pain is in good control on home Norco q6 hours PRN, Tylenol PRN, Gabapentin 100 mg TID. A fentanyl patch for pain was added.   Tobacco Abuse: Current smoker, but was managed on nicotine patch.  History of DVT and PE: She was on chronic Lovenox for her history of DVT and PE. She received 100 mg twice a day, which is below the typical dosing of 1 mg/kg 3 times a day (150 mg for her). A low molecular weight heparin level was checked 4 hours after receiving Lovenox, and it was found to be therapeutic. As result, her home dose of Lovenox was continued during the hospitalization.  She was switched to Eliquis in preparation for SNF at 5 mg BID.  Discharge Vitals:   BP 119/38 mmHg  Pulse 72  Temp(Src) 97.9 F (36.6 C) (Axillary)  Resp  18  Ht 5\' 7"  (1.702 m)  Wt 304 lb 10.8 oz (138.2 kg)  BMI 47.71 kg/m2  SpO2 92%  Discharge Labs:  No results found for this or any previous visit (from the past 24 hour(s)).  Services Ordered on Discharge: SNF Equipment Ordered on Discharge: Oxymizer  Signed:  Jenetta Downer, MD PGY-2 IMTS 2:37 PM 04/06/2014.

## 2014-04-07 DIAGNOSIS — I272 Other secondary pulmonary hypertension: Secondary | ICD-10-CM | POA: Diagnosis not present

## 2014-04-07 DIAGNOSIS — J849 Interstitial pulmonary disease, unspecified: Secondary | ICD-10-CM | POA: Diagnosis not present

## 2014-04-07 DIAGNOSIS — J189 Pneumonia, unspecified organism: Secondary | ICD-10-CM | POA: Diagnosis not present

## 2014-04-08 DIAGNOSIS — F329 Major depressive disorder, single episode, unspecified: Secondary | ICD-10-CM | POA: Diagnosis not present

## 2014-04-08 DIAGNOSIS — E785 Hyperlipidemia, unspecified: Secondary | ICD-10-CM | POA: Diagnosis not present

## 2014-04-08 DIAGNOSIS — G4733 Obstructive sleep apnea (adult) (pediatric): Secondary | ICD-10-CM | POA: Diagnosis not present

## 2014-04-08 DIAGNOSIS — J9601 Acute respiratory failure with hypoxia: Secondary | ICD-10-CM | POA: Diagnosis not present

## 2014-04-08 DIAGNOSIS — M545 Low back pain: Secondary | ICD-10-CM | POA: Diagnosis not present

## 2014-04-10 DIAGNOSIS — F419 Anxiety disorder, unspecified: Secondary | ICD-10-CM | POA: Diagnosis not present

## 2014-04-10 DIAGNOSIS — J849 Interstitial pulmonary disease, unspecified: Secondary | ICD-10-CM | POA: Diagnosis not present

## 2014-04-10 DIAGNOSIS — J9601 Acute respiratory failure with hypoxia: Secondary | ICD-10-CM | POA: Diagnosis not present

## 2014-04-10 DIAGNOSIS — J189 Pneumonia, unspecified organism: Secondary | ICD-10-CM | POA: Diagnosis not present

## 2014-04-11 DIAGNOSIS — J189 Pneumonia, unspecified organism: Secondary | ICD-10-CM | POA: Diagnosis not present

## 2014-04-11 DIAGNOSIS — I272 Other secondary pulmonary hypertension: Secondary | ICD-10-CM | POA: Diagnosis not present

## 2014-04-11 DIAGNOSIS — J9601 Acute respiratory failure with hypoxia: Secondary | ICD-10-CM | POA: Diagnosis not present

## 2014-04-11 DIAGNOSIS — F419 Anxiety disorder, unspecified: Secondary | ICD-10-CM | POA: Diagnosis not present

## 2014-05-03 DIAGNOSIS — I82409 Acute embolism and thrombosis of unspecified deep veins of unspecified lower extremity: Secondary | ICD-10-CM | POA: Diagnosis not present

## 2014-05-03 DIAGNOSIS — G4733 Obstructive sleep apnea (adult) (pediatric): Secondary | ICD-10-CM | POA: Diagnosis not present

## 2014-05-03 DIAGNOSIS — J9601 Acute respiratory failure with hypoxia: Secondary | ICD-10-CM | POA: Diagnosis not present

## 2014-05-03 DIAGNOSIS — M549 Dorsalgia, unspecified: Secondary | ICD-10-CM | POA: Diagnosis not present

## 2014-05-03 DIAGNOSIS — F339 Major depressive disorder, recurrent, unspecified: Secondary | ICD-10-CM | POA: Diagnosis not present

## 2014-05-04 ENCOUNTER — Telehealth: Payer: Self-pay | Admitting: Internal Medicine

## 2014-05-04 NOTE — Telephone Encounter (Signed)
Call to patient to confirm appointment for 05/05/14 at 11:15 lmtcb

## 2014-05-05 ENCOUNTER — Encounter: Payer: Medicaid Other | Admitting: Internal Medicine

## 2014-05-05 ENCOUNTER — Encounter: Payer: Self-pay | Admitting: Internal Medicine

## 2014-05-16 ENCOUNTER — Other Ambulatory Visit (INDEPENDENT_AMBULATORY_CARE_PROVIDER_SITE_OTHER): Payer: Medicare Other

## 2014-05-16 ENCOUNTER — Encounter: Payer: Self-pay | Admitting: Internal Medicine

## 2014-05-16 ENCOUNTER — Ambulatory Visit (INDEPENDENT_AMBULATORY_CARE_PROVIDER_SITE_OTHER): Payer: Medicare Other | Admitting: Internal Medicine

## 2014-05-16 ENCOUNTER — Ambulatory Visit
Admission: RE | Admit: 2014-05-16 | Discharge: 2014-05-16 | Disposition: A | Discharge status: Home / Self Care | Payer: Medicare Other | Source: Ambulatory Visit | Attending: Internal Medicine | Admitting: Internal Medicine

## 2014-05-16 VITALS — BP 102/78 | HR 110 | Ht 67.0 in | Wt 302.0 lb

## 2014-05-16 DIAGNOSIS — R5381 Other malaise: Secondary | ICD-10-CM

## 2014-05-16 DIAGNOSIS — J9621 Acute and chronic respiratory failure with hypoxia: Secondary | ICD-10-CM | POA: Diagnosis not present

## 2014-05-16 DIAGNOSIS — D6489 Other specified anemias: Secondary | ICD-10-CM

## 2014-05-16 LAB — CBC
HCT: 38 % (ref 36.0–46.0)
Hemoglobin: 12.1 g/dL (ref 12.0–15.0)
MCHC: 31.7 g/dL (ref 30.0–36.0)
MCV: 81.3 fl (ref 78.0–100.0)
Platelets: 318 10*3/uL (ref 150.0–400.0)
RBC: 4.68 Mil/uL (ref 3.87–5.11)
RDW: 24.9 % — ABNORMAL HIGH (ref 11.5–15.5)
WBC: 7.8 10*3/uL (ref 4.0–10.5)

## 2014-05-16 LAB — BASIC METABOLIC PANEL
BUN: 11 mg/dL (ref 6–23)
CALCIUM: 9.1 mg/dL (ref 8.4–10.5)
CHLORIDE: 103 meq/L (ref 96–112)
CO2: 35 meq/L — AB (ref 19–32)
Creatinine, Ser: 0.61 mg/dL (ref 0.40–1.20)
GFR: 103.21 mL/min (ref >60.00)
GLUCOSE: 122 mg/dL — AB (ref 70–99)
Potassium: 4.5 mEq/L (ref 3.5–5.1)
SODIUM: 142 meq/L (ref 135–145)

## 2014-05-16 NOTE — Progress Notes (Signed)
Subjective:    Patient ID: [name], female    DOB: [birth year], 69 y.o.   MRN: [id number]  HPI   OV 05/16/2014  Chief Complaint  Patient presents with  . Hospitalization Follow-up    Pt here for HFU. Pt was d/c from [facility] for CAP. Pt states her breathing is not doing better. Pt c/o SOB with any activity, dry cough and midsternal CP.     Follow-up acute on chronic respiratory failure  69 year old female morbidly obese. Very poor historian. As best as I can gather she is a 20 pack former smoker. She says that she's been on and off oxygen for the last few years. She was admitted February 2016 through mid-March 2016 for acute on chronic respiratory failure requiring mechanical ventilation. 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 There was some thought this could've been hypersensitivity pneumonitis but I'm not sure. At one point in time hospice was discussed but she ended up at [facility] where she's been for the last 1 month. As best as I can gather she was on 8 L oxygen at discharge but currently she is only on 2 L oxygen but she is unaware that she is improved. She feels miserable overall and the same. Overall physical deconditioning and shortness of breath is the main issue.  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   reports that she quit smoking about 2 months ago. Her smoking use included Cigarettes. She has a 20 pack-year smoking history. She has never used smokeless tobacco.  Past Surgical History  Procedure Laterality Date  . Cervical discectomy  11/1995 and 05/2002    by Dr. Hal Neer  . Abdominal hysterectomy  1976  . Bladder suspension  1993  . Orif ankle fracture  1993  . Lumbar laminectomy/decompression microdiscectomy  02/2007    by Dr. Hal Neer  . Carpal tunnel release Left   . Tonsillectomy and adenoidectomy  1953  . Appendectomy    . Dilation and curettage of uterus    . Tubal ligation    . Carpectomy  07/23/2011    Procedure: CARPECTOMY;  Surgeon: Linna Hoff, MD;  Location: [facility];  Service: Orthopedics;  Laterality: Left;  LEFT WRIST PROXIMAL ROW CARPECTOMY  . Esophagogastroduodenoscopy  12/25/2011    Procedure: ESOPHAGOGASTRODUODENOSCOPY (EGD);  Surgeon: Beryle Beams, MD;  Location: [facility] ENDOSCOPY;  Service: Endoscopy;  Laterality: N/A;  . Breast surgery  1992    Bilateral mastectomies for breast cancer  . Rhinoplasty  1993  . Spine surgery    . Hot hemostasis  02/06/2012    Procedure: HOT HEMOSTASIS (ARGON PLASMA COAGULATION/BICAP);  Surgeon: Beryle Beams, MD;  Location: [facility] ENDOSCOPY;  Service: Endoscopy;  Laterality: N/A;  . Esophagogastroduodenoscopy  02/06/2012    Procedure: ESOPHAGOGASTRODUODENOSCOPY (EGD);  Surgeon: Beryle Beams, MD;  Location: [facility] ENDOSCOPY;  Service: Endoscopy;  Laterality: N/A;  . Esophagogastroduodenoscopy N/A 04/02/2012      Procedure: ESOPHAGOGASTRODUODENOSCOPY (EGD);  Surgeon: Beryle Beams, MD;  Location: [facility] ENDOSCOPY;  Service: Endoscopy;  Laterality: N/A;  EGD w/ APC  . Back surgery      x 3  . Tendon transfer Left 06/24/2012    "thumb" (06/24/2012)  . Mastectomy Bilateral   . Melanoma excision Left     "side of my nose" (06/24/2012)  . Tendon transfer Left 06/24/2012    Procedure: LEFT THUMB EIP TO EPL TENDON TRANSFER;  Surgeon: Linna Hoff, MD;  Location: [place];  Service: Orthopedics;  Laterality: Left;    Allergies  Allergen Reactions  . Adhesive [Tape] Other (See Comments)    "I break out all over"; Can only use paper tape  . Codeine Other (See Comments)    "first dose I took, my aide found me the next day laying on the floor"  . Morphine Other (See Comments)    caused change in mental status, she does not want to try it again.  . Sulfonamide Derivatives Nausea And Vomiting  . Thorazine [Chlorpromazine] Nausea Only    Immunization History  Administered Date(s) Administered  . Influenza Split 01/02/2011, 12/24/2011  . Influenza Whole 10/18/2007, 10/10/2008, 10/22/2009  . Influenza,inj,Quad PF,36+ Mos 09/22/2012, 11/04/2013  . Pneumococcal Polysaccharide-23 12/24/2011  . Tdap 11/04/2013    Family History  Problem Relation Age of Onset  . Heart disease Mother   . Deep vein thrombosis Mother   . Cancer - Lung Brother   . Alcohol abuse Father   . Schizophrenia Father   . Anxiety disorder Son   . Healthy Brother   . Healthy Brother      Current outpatient prescriptions:  .  albuterol (PROVENTIL) (2.5 MG/3ML) 0.083% nebulizer solution, Take 3 mLs (2.5 mg total) by nebulization every 6 (six) hours as needed for shortness of breath., Disp: 75 mL, Rfl: 3 .  alendronate (FOSAMAX) 70 MG tablet, Take 1 tablet (70 mg total) by mouth every 7 (seven) days. Take with a full glass of water on an empty stomach., Disp: 12 tablet, Rfl: 3 .  apixaban (ELIQUIS) 5 MG TABS tablet, Take 1 tablet (5 mg  total) by mouth 2 (two) times daily., Disp: 60 tablet, Rfl: 1 .  budesonide (PULMICORT) 0.25 MG/2ML nebulizer solution, Take 2 mLs (0.25 mg total) by nebulization every 6 (six) hours., Disp: 60 mL, Rfl: 12 .  calcium citrate-vitamin D (CITRACAL+D) 315-200 MG-UNIT per tablet, Take 2 tablets by mouth 2 (two) times daily., Disp: 360 tablet, Rfl: 3 .  doxycycline (VIBRA-TABS) 100 MG tablet, Take 1 tablet (100 mg total) by mouth every 12 (twelve) hours., Disp: 5 tablet, Rfl: 0 .  fentaNYL (DURAGESIC - DOSED MCG/HR) 12 MCG/HR, Place 1 patch (12.5 mcg total) onto the skin every 3 (three) days., Disp: 30 patch, Rfl: 0 .  ferrous sulfate 325 (65 FE) MG EC tablet, Take 325 mg by mouth 3 (three) times daily with meals., Disp: , Rfl:  .  gabapentin (NEURONTIN) 100 MG capsule, Take 100 mg by mouth 3 (three) times daily. , Disp: , Rfl:  .  HYDROcodone-acetaminophen (NORCO) 10-325 MG per tablet, Take 1-2 tablets by mouth every 6 (six) hours as needed for moderate pain. (Patient not taking: Reported on 03/13/2014), Disp: 30 tablet, Rfl:  0 .  HYDROcodone-acetaminophen (NORCO) 10-325 MG per tablet, Take 1-2 tablets by mouth every 6 (six) hours as needed for moderate pain (dyspnea, cough)., Disp: 30 tablet, Rfl: 0 .  ipratropium-albuterol (DUONEB) 0.5-2.5 (3) MG/3ML SOLN, Take 3 mLs by nebulization every 6 (six) hours., Disp: 360 mL, Rfl: 1 .  lamoTRIgine (LAMICTAL) 200 MG tablet, Take 200 mg by mouth every morning., Disp: , Rfl:  .  LORazepam (ATIVAN) 1 MG tablet, Take 1-2 mg by mouth 2 (two) times daily. Takes 1 tablet in morning and 2 tablets at bedtime, Disp: , Rfl:  .  LORazepam (ATIVAN) 2 MG tablet, Take 1 tablet (2 mg total) by mouth at bedtime., Disp: 30 tablet, Rfl: 0 .  pantoprazole (PROTONIX) 40 MG tablet, Take 1 tablet (40 mg total) by mouth daily., Disp: 90 tablet, Rfl: 3 .  Potassium Chloride ER 20 MEQ TBCR, Take 80 mEq by mouth daily., Disp: , Rfl:  .  pravastatin (PRAVACHOL) 80 MG tablet, Take 1 tablet  (80 mg total) by mouth daily., Disp: 90 tablet, Rfl: 3 .  predniSONE (DELTASONE) 50 MG tablet, Taper by 10mg  as appropriate, per SNF physician., Disp: 20 tablet, Rfl: 0 .  QUEtiapine (SEROQUEL) 200 MG tablet, Take 400 mg by mouth at bedtime. , Disp: , Rfl:  .  sertraline (ZOLOFT) 100 MG tablet, Take 200 mg by mouth every morning. , Disp: , Rfl:  .  vitamin C (ASCORBIC ACID) 500 MG tablet, Take 1 tablet (500 mg total) by mouth daily., Disp: 90 tablet, Rfl: 0      Review of Systems  Constitutional: Negative for fever and unexpected weight change.  HENT: Negative for congestion, dental problem, ear pain, nosebleeds, postnasal drip, rhinorrhea, sinus pressure, sneezing, sore throat and trouble swallowing.   Eyes: Negative for redness and itching.  Respiratory: Positive for cough and shortness of breath. Negative for chest tightness and wheezing.   Cardiovascular: Positive for chest pain. Negative for palpitations and leg swelling.  Gastrointestinal: Negative for nausea and vomiting.  Genitourinary: Negative for dysuria.  Musculoskeletal: Negative for joint swelling.  Skin: Negative for rash.  Neurological: Negative for headaches.  Hematological: Does not bruise/bleed easily.  Psychiatric/Behavioral: Negative for dysphoric mood. The patient is not nervous/anxious.        Objective:   Physical Exam  Filed Vitals:   05/16/14 1532  BP: 102/78  Pulse: 110  Height: 5\' 7"  (1.702 m)  Weight: 302 lb (136.986 kg)  SpO2: 93%   Gen.: Physically deconditioned sitting on the wheelchair HEENT: Nasal cannula oxygen on No neck nodes. Neck suppled Respiratory exam significant obesity impeding exam. Bilateral scattered crackles present. Normal work of breathing Cardiovascular: Normal heart sounds and no murmurs Abdomen: Obese soft Extremities no cyanosis no clubbing no edema Skin: Intact in  the visible parts Neuro: alert x oriented x 3. Moves all 4. Speech normal Psych: poor historian       Assessment & Plan:     ICD-9-CM ICD-10-CM   1. Acute on chronic respiratory failure with hypoxia 518.84 J96.21 DG Chest 2 View   799.02  CBC     Basic Metabolic Panel (BMET)  2. Physical deconditioning 799.3 R53.81   3. Anemia due to other cause 285.8 D64.89    #resp failure I'm unclear if she has basal interstitial lung disease. At this point in time I wanted establish that she is improved from her acute presentation. Will get CXR. Will also get BMET Depending on CXR results - plan HRCT chest  #  anemia and recent ICU illness get CBC, and chemistry profile an  #deconditoning  Meanwhile the most important thing is physical deconditioning -she needs continued physical therapy and rehabilitation    Dr. Brand Males, M.D., [facility].C.P Pulmonary and Critical Care Medicine Staff Physician [place] [facility] Pager: [phone], If no answer or between  15:00h - 7:00h: call [phone]  05/16/2014 4:08 PM

## 2014-05-16 NOTE — Patient Instructions (Addendum)
ICD-9-CM ICD-10-CM   1. Acute on chronic respiratory failure with hypoxia 518.84 J96.21    799.02    2. Physical deconditioning 799.3 R53.81   3. Anemia due to other cause 285.8 D64.89    Do CXR 2 view Do cbc, bmet Continue PT/rehab at [facility]  - will call with results and decide followu date and need for CT chest

## 2014-05-17 DIAGNOSIS — E119 Type 2 diabetes mellitus without complications: Secondary | ICD-10-CM | POA: Diagnosis not present

## 2014-05-18 DIAGNOSIS — I272 Other secondary pulmonary hypertension: Secondary | ICD-10-CM | POA: Diagnosis not present

## 2014-05-18 DIAGNOSIS — Z79899 Other long term (current) drug therapy: Secondary | ICD-10-CM | POA: Diagnosis not present

## 2014-05-18 DIAGNOSIS — D649 Anemia, unspecified: Secondary | ICD-10-CM | POA: Diagnosis not present

## 2014-05-18 DIAGNOSIS — R918 Other nonspecific abnormal finding of lung field: Secondary | ICD-10-CM | POA: Diagnosis not present

## 2014-05-18 DIAGNOSIS — D509 Iron deficiency anemia, unspecified: Secondary | ICD-10-CM | POA: Diagnosis not present

## 2014-05-18 DIAGNOSIS — I10 Essential (primary) hypertension: Secondary | ICD-10-CM | POA: Diagnosis not present

## 2014-05-18 DIAGNOSIS — I509 Heart failure, unspecified: Secondary | ICD-10-CM | POA: Diagnosis not present

## 2014-05-18 DIAGNOSIS — J9621 Acute and chronic respiratory failure with hypoxia: Secondary | ICD-10-CM | POA: Diagnosis not present

## 2014-05-18 DIAGNOSIS — J849 Interstitial pulmonary disease, unspecified: Secondary | ICD-10-CM | POA: Diagnosis not present

## 2014-05-24 DIAGNOSIS — N3946 Mixed incontinence: Secondary | ICD-10-CM | POA: Diagnosis not present

## 2014-05-25 DIAGNOSIS — N39498 Other specified urinary incontinence: Secondary | ICD-10-CM | POA: Diagnosis not present

## 2014-05-25 DIAGNOSIS — F339 Major depressive disorder, recurrent, unspecified: Secondary | ICD-10-CM | POA: Diagnosis not present

## 2014-05-25 DIAGNOSIS — G4733 Obstructive sleep apnea (adult) (pediatric): Secondary | ICD-10-CM | POA: Diagnosis not present

## 2014-05-25 DIAGNOSIS — J9601 Acute respiratory failure with hypoxia: Secondary | ICD-10-CM | POA: Diagnosis not present

## 2014-05-25 DIAGNOSIS — M545 Low back pain: Secondary | ICD-10-CM | POA: Diagnosis not present

## 2014-06-01 ENCOUNTER — Telehealth: Payer: Self-pay | Admitting: Internal Medicine

## 2014-06-01 NOTE — Telephone Encounter (Signed)
Let Zollie Pee know that CXR shows improvement and anemia better. Kidneys fine  Plan cntinue rehab Do HRCT in 3 months wo contrast for ILD and see me  Dr. Brand Males, M.D., [facility].C.P Pulmonary and Critical Care Medicine Staff Physician [place] [facility] Pager: [phone], If no answer or between  15:00h - 7:00h: call [phone]  06/01/2014 1:48 AM

## 2014-06-01 NOTE — Telephone Encounter (Signed)
ATC pt - Only number in chart has been disconnected - [facility]

## 2014-06-08 NOTE — Telephone Encounter (Signed)
lmtcb for [name] as pt's line is not working.

## 2014-06-09 NOTE — Telephone Encounter (Signed)
ATC pt.  Called and spoke to [name]. MIchael stated to call him back at 3pm to get pt's contact information as he did not have it at the time of the call. WCB.

## 2014-06-12 NOTE — Telephone Encounter (Signed)
lmtcb for [name].

## 2014-06-13 DIAGNOSIS — N390 Urinary tract infection, site not specified: Secondary | ICD-10-CM | POA: Diagnosis not present

## 2014-06-13 DIAGNOSIS — R319 Hematuria, unspecified: Secondary | ICD-10-CM | POA: Diagnosis not present

## 2014-06-14 NOTE — Telephone Encounter (Signed)
Unable to reach patient on her number given.  Called pt son, gave alternative number to reach the patient ATC pt @ [phone] LMTCB x 1

## 2014-06-15 ENCOUNTER — Encounter: Payer: Self-pay | Admitting: Emergency Medicine

## 2014-06-15 NOTE — Telephone Encounter (Signed)
Due to several unsuccessful attempts to contact pt, a letter has been placed in out going mail for pt to contact us regarding results. Will sign off.

## 2014-07-07 DIAGNOSIS — J9601 Acute respiratory failure with hypoxia: Secondary | ICD-10-CM | POA: Diagnosis not present

## 2014-07-07 DIAGNOSIS — N39498 Other specified urinary incontinence: Secondary | ICD-10-CM | POA: Diagnosis not present

## 2014-07-07 DIAGNOSIS — F339 Major depressive disorder, recurrent, unspecified: Secondary | ICD-10-CM | POA: Diagnosis not present

## 2014-07-07 DIAGNOSIS — M545 Low back pain: Secondary | ICD-10-CM | POA: Diagnosis not present

## 2014-07-07 DIAGNOSIS — G4733 Obstructive sleep apnea (adult) (pediatric): Secondary | ICD-10-CM | POA: Diagnosis not present

## 2014-07-13 DIAGNOSIS — M810 Age-related osteoporosis without current pathological fracture: Secondary | ICD-10-CM | POA: Diagnosis not present

## 2014-07-13 DIAGNOSIS — J9601 Acute respiratory failure with hypoxia: Secondary | ICD-10-CM | POA: Diagnosis not present

## 2014-07-13 DIAGNOSIS — M6281 Muscle weakness (generalized): Secondary | ICD-10-CM | POA: Diagnosis not present

## 2014-07-21 DIAGNOSIS — R2689 Other abnormalities of gait and mobility: Secondary | ICD-10-CM | POA: Diagnosis not present

## 2014-07-21 DIAGNOSIS — M6281 Muscle weakness (generalized): Secondary | ICD-10-CM | POA: Diagnosis not present

## 2014-07-21 DIAGNOSIS — F3340 Major depressive disorder, recurrent, in remission, unspecified: Secondary | ICD-10-CM | POA: Diagnosis not present

## 2014-07-21 DIAGNOSIS — J9601 Acute respiratory failure with hypoxia: Secondary | ICD-10-CM | POA: Diagnosis not present

## 2014-07-21 DIAGNOSIS — R279 Unspecified lack of coordination: Secondary | ICD-10-CM | POA: Diagnosis not present

## 2014-07-27 DIAGNOSIS — R2681 Unsteadiness on feet: Secondary | ICD-10-CM | POA: Diagnosis not present

## 2014-07-27 DIAGNOSIS — M810 Age-related osteoporosis without current pathological fracture: Secondary | ICD-10-CM | POA: Diagnosis not present

## 2014-07-27 DIAGNOSIS — J9601 Acute respiratory failure with hypoxia: Secondary | ICD-10-CM | POA: Diagnosis not present

## 2014-07-27 DIAGNOSIS — M6281 Muscle weakness (generalized): Secondary | ICD-10-CM | POA: Diagnosis not present

## 2014-07-28 DIAGNOSIS — J9601 Acute respiratory failure with hypoxia: Secondary | ICD-10-CM | POA: Diagnosis not present

## 2014-07-28 DIAGNOSIS — R2681 Unsteadiness on feet: Secondary | ICD-10-CM | POA: Diagnosis not present

## 2014-07-28 DIAGNOSIS — M810 Age-related osteoporosis without current pathological fracture: Secondary | ICD-10-CM | POA: Diagnosis not present

## 2014-07-28 DIAGNOSIS — M6281 Muscle weakness (generalized): Secondary | ICD-10-CM | POA: Diagnosis not present

## 2014-07-29 DIAGNOSIS — M6281 Muscle weakness (generalized): Secondary | ICD-10-CM | POA: Diagnosis not present

## 2014-07-29 DIAGNOSIS — J9601 Acute respiratory failure with hypoxia: Secondary | ICD-10-CM | POA: Diagnosis not present

## 2014-07-29 DIAGNOSIS — R2681 Unsteadiness on feet: Secondary | ICD-10-CM | POA: Diagnosis not present

## 2014-07-29 DIAGNOSIS — M810 Age-related osteoporosis without current pathological fracture: Secondary | ICD-10-CM | POA: Diagnosis not present

## 2014-07-31 DIAGNOSIS — J9601 Acute respiratory failure with hypoxia: Secondary | ICD-10-CM | POA: Diagnosis not present

## 2014-07-31 DIAGNOSIS — R2681 Unsteadiness on feet: Secondary | ICD-10-CM | POA: Diagnosis not present

## 2014-07-31 DIAGNOSIS — M810 Age-related osteoporosis without current pathological fracture: Secondary | ICD-10-CM | POA: Diagnosis not present

## 2014-07-31 DIAGNOSIS — M6281 Muscle weakness (generalized): Secondary | ICD-10-CM | POA: Diagnosis not present

## 2014-08-01 DIAGNOSIS — M810 Age-related osteoporosis without current pathological fracture: Secondary | ICD-10-CM | POA: Diagnosis not present

## 2014-08-01 DIAGNOSIS — J9601 Acute respiratory failure with hypoxia: Secondary | ICD-10-CM | POA: Diagnosis not present

## 2014-08-01 DIAGNOSIS — M6281 Muscle weakness (generalized): Secondary | ICD-10-CM | POA: Diagnosis not present

## 2014-08-01 DIAGNOSIS — R2681 Unsteadiness on feet: Secondary | ICD-10-CM | POA: Diagnosis not present

## 2014-08-03 DIAGNOSIS — M810 Age-related osteoporosis without current pathological fracture: Secondary | ICD-10-CM | POA: Diagnosis not present

## 2014-08-03 DIAGNOSIS — R2681 Unsteadiness on feet: Secondary | ICD-10-CM | POA: Diagnosis not present

## 2014-08-03 DIAGNOSIS — J9601 Acute respiratory failure with hypoxia: Secondary | ICD-10-CM | POA: Diagnosis not present

## 2014-08-03 DIAGNOSIS — M6281 Muscle weakness (generalized): Secondary | ICD-10-CM | POA: Diagnosis not present

## 2014-08-04 DIAGNOSIS — J80 Acute respiratory distress syndrome: Secondary | ICD-10-CM | POA: Diagnosis not present

## 2014-08-04 DIAGNOSIS — M810 Age-related osteoporosis without current pathological fracture: Secondary | ICD-10-CM | POA: Diagnosis not present

## 2014-08-04 DIAGNOSIS — I517 Cardiomegaly: Secondary | ICD-10-CM | POA: Diagnosis not present

## 2014-08-04 DIAGNOSIS — R2681 Unsteadiness on feet: Secondary | ICD-10-CM | POA: Diagnosis not present

## 2014-08-04 DIAGNOSIS — M6281 Muscle weakness (generalized): Secondary | ICD-10-CM | POA: Diagnosis not present

## 2014-08-04 DIAGNOSIS — J9601 Acute respiratory failure with hypoxia: Secondary | ICD-10-CM | POA: Diagnosis not present

## 2014-08-05 DIAGNOSIS — M810 Age-related osteoporosis without current pathological fracture: Secondary | ICD-10-CM | POA: Diagnosis not present

## 2014-08-05 DIAGNOSIS — J9601 Acute respiratory failure with hypoxia: Secondary | ICD-10-CM | POA: Diagnosis not present

## 2014-08-05 DIAGNOSIS — R2681 Unsteadiness on feet: Secondary | ICD-10-CM | POA: Diagnosis not present

## 2014-08-05 DIAGNOSIS — M6281 Muscle weakness (generalized): Secondary | ICD-10-CM | POA: Diagnosis not present

## 2014-08-07 DIAGNOSIS — M810 Age-related osteoporosis without current pathological fracture: Secondary | ICD-10-CM | POA: Diagnosis not present

## 2014-08-07 DIAGNOSIS — M6281 Muscle weakness (generalized): Secondary | ICD-10-CM | POA: Diagnosis not present

## 2014-08-07 DIAGNOSIS — R2681 Unsteadiness on feet: Secondary | ICD-10-CM | POA: Diagnosis not present

## 2014-08-07 DIAGNOSIS — J9601 Acute respiratory failure with hypoxia: Secondary | ICD-10-CM | POA: Diagnosis not present

## 2014-08-08 DIAGNOSIS — M6281 Muscle weakness (generalized): Secondary | ICD-10-CM | POA: Diagnosis not present

## 2014-08-08 DIAGNOSIS — M810 Age-related osteoporosis without current pathological fracture: Secondary | ICD-10-CM | POA: Diagnosis not present

## 2014-08-08 DIAGNOSIS — J9601 Acute respiratory failure with hypoxia: Secondary | ICD-10-CM | POA: Diagnosis not present

## 2014-08-08 DIAGNOSIS — R2681 Unsteadiness on feet: Secondary | ICD-10-CM | POA: Diagnosis not present

## 2014-08-10 DIAGNOSIS — M6281 Muscle weakness (generalized): Secondary | ICD-10-CM | POA: Diagnosis not present

## 2014-08-10 DIAGNOSIS — J9601 Acute respiratory failure with hypoxia: Secondary | ICD-10-CM | POA: Diagnosis not present

## 2014-08-10 DIAGNOSIS — M810 Age-related osteoporosis without current pathological fracture: Secondary | ICD-10-CM | POA: Diagnosis not present

## 2014-08-10 DIAGNOSIS — R2681 Unsteadiness on feet: Secondary | ICD-10-CM | POA: Diagnosis not present

## 2014-08-11 DIAGNOSIS — M810 Age-related osteoporosis without current pathological fracture: Secondary | ICD-10-CM | POA: Diagnosis not present

## 2014-08-11 DIAGNOSIS — J9601 Acute respiratory failure with hypoxia: Secondary | ICD-10-CM | POA: Diagnosis not present

## 2014-08-11 DIAGNOSIS — M6281 Muscle weakness (generalized): Secondary | ICD-10-CM | POA: Diagnosis not present

## 2014-08-11 DIAGNOSIS — R2681 Unsteadiness on feet: Secondary | ICD-10-CM | POA: Diagnosis not present

## 2014-08-14 DIAGNOSIS — J9601 Acute respiratory failure with hypoxia: Secondary | ICD-10-CM | POA: Diagnosis not present

## 2014-08-14 DIAGNOSIS — M810 Age-related osteoporosis without current pathological fracture: Secondary | ICD-10-CM | POA: Diagnosis not present

## 2014-08-14 DIAGNOSIS — R2681 Unsteadiness on feet: Secondary | ICD-10-CM | POA: Diagnosis not present

## 2014-08-14 DIAGNOSIS — M6281 Muscle weakness (generalized): Secondary | ICD-10-CM | POA: Diagnosis not present

## 2014-08-15 DIAGNOSIS — M6281 Muscle weakness (generalized): Secondary | ICD-10-CM | POA: Diagnosis not present

## 2014-08-15 DIAGNOSIS — J9601 Acute respiratory failure with hypoxia: Secondary | ICD-10-CM | POA: Diagnosis not present

## 2014-08-15 DIAGNOSIS — R2681 Unsteadiness on feet: Secondary | ICD-10-CM | POA: Diagnosis not present

## 2014-08-15 DIAGNOSIS — M810 Age-related osteoporosis without current pathological fracture: Secondary | ICD-10-CM | POA: Diagnosis not present

## 2014-08-16 DIAGNOSIS — R2681 Unsteadiness on feet: Secondary | ICD-10-CM | POA: Diagnosis not present

## 2014-08-16 DIAGNOSIS — M6281 Muscle weakness (generalized): Secondary | ICD-10-CM | POA: Diagnosis not present

## 2014-08-16 DIAGNOSIS — J9601 Acute respiratory failure with hypoxia: Secondary | ICD-10-CM | POA: Diagnosis not present

## 2014-08-16 DIAGNOSIS — M810 Age-related osteoporosis without current pathological fracture: Secondary | ICD-10-CM | POA: Diagnosis not present

## 2014-08-17 DIAGNOSIS — M6281 Muscle weakness (generalized): Secondary | ICD-10-CM | POA: Diagnosis not present

## 2014-08-17 DIAGNOSIS — M810 Age-related osteoporosis without current pathological fracture: Secondary | ICD-10-CM | POA: Diagnosis not present

## 2014-08-17 DIAGNOSIS — R2681 Unsteadiness on feet: Secondary | ICD-10-CM | POA: Diagnosis not present

## 2014-08-17 DIAGNOSIS — J9601 Acute respiratory failure with hypoxia: Secondary | ICD-10-CM | POA: Diagnosis not present

## 2014-08-18 DIAGNOSIS — R2681 Unsteadiness on feet: Secondary | ICD-10-CM | POA: Diagnosis not present

## 2014-08-18 DIAGNOSIS — J9601 Acute respiratory failure with hypoxia: Secondary | ICD-10-CM | POA: Diagnosis not present

## 2014-08-18 DIAGNOSIS — M6281 Muscle weakness (generalized): Secondary | ICD-10-CM | POA: Diagnosis not present

## 2014-08-18 DIAGNOSIS — M810 Age-related osteoporosis without current pathological fracture: Secondary | ICD-10-CM | POA: Diagnosis not present

## 2014-08-21 DIAGNOSIS — M810 Age-related osteoporosis without current pathological fracture: Secondary | ICD-10-CM | POA: Diagnosis not present

## 2014-08-21 DIAGNOSIS — R2681 Unsteadiness on feet: Secondary | ICD-10-CM | POA: Diagnosis not present

## 2014-08-21 DIAGNOSIS — J9601 Acute respiratory failure with hypoxia: Secondary | ICD-10-CM | POA: Diagnosis not present

## 2014-08-21 DIAGNOSIS — M6281 Muscle weakness (generalized): Secondary | ICD-10-CM | POA: Diagnosis not present

## 2014-08-22 DIAGNOSIS — R2681 Unsteadiness on feet: Secondary | ICD-10-CM | POA: Diagnosis not present

## 2014-08-22 DIAGNOSIS — J9601 Acute respiratory failure with hypoxia: Secondary | ICD-10-CM | POA: Diagnosis not present

## 2014-08-22 DIAGNOSIS — M810 Age-related osteoporosis without current pathological fracture: Secondary | ICD-10-CM | POA: Diagnosis not present

## 2014-08-22 DIAGNOSIS — M6281 Muscle weakness (generalized): Secondary | ICD-10-CM | POA: Diagnosis not present

## 2014-08-23 DIAGNOSIS — J9601 Acute respiratory failure with hypoxia: Secondary | ICD-10-CM | POA: Diagnosis not present

## 2014-08-23 DIAGNOSIS — M6281 Muscle weakness (generalized): Secondary | ICD-10-CM | POA: Diagnosis not present

## 2014-08-23 DIAGNOSIS — R2681 Unsteadiness on feet: Secondary | ICD-10-CM | POA: Diagnosis not present

## 2014-08-23 DIAGNOSIS — M810 Age-related osteoporosis without current pathological fracture: Secondary | ICD-10-CM | POA: Diagnosis not present

## 2014-08-24 DIAGNOSIS — R2681 Unsteadiness on feet: Secondary | ICD-10-CM | POA: Diagnosis not present

## 2014-08-24 DIAGNOSIS — M6281 Muscle weakness (generalized): Secondary | ICD-10-CM | POA: Diagnosis not present

## 2014-08-24 DIAGNOSIS — M810 Age-related osteoporosis without current pathological fracture: Secondary | ICD-10-CM | POA: Diagnosis not present

## 2014-08-24 DIAGNOSIS — J9601 Acute respiratory failure with hypoxia: Secondary | ICD-10-CM | POA: Diagnosis not present

## 2014-08-25 DIAGNOSIS — I517 Cardiomegaly: Secondary | ICD-10-CM | POA: Diagnosis not present

## 2014-08-25 DIAGNOSIS — M6281 Muscle weakness (generalized): Secondary | ICD-10-CM | POA: Diagnosis not present

## 2014-08-25 DIAGNOSIS — R2681 Unsteadiness on feet: Secondary | ICD-10-CM | POA: Diagnosis not present

## 2014-08-25 DIAGNOSIS — M810 Age-related osteoporosis without current pathological fracture: Secondary | ICD-10-CM | POA: Diagnosis not present

## 2014-08-25 DIAGNOSIS — J189 Pneumonia, unspecified organism: Secondary | ICD-10-CM | POA: Diagnosis not present

## 2014-08-25 DIAGNOSIS — I509 Heart failure, unspecified: Secondary | ICD-10-CM | POA: Diagnosis not present

## 2014-08-25 DIAGNOSIS — J9601 Acute respiratory failure with hypoxia: Secondary | ICD-10-CM | POA: Diagnosis not present

## 2014-08-28 DIAGNOSIS — M6281 Muscle weakness (generalized): Secondary | ICD-10-CM | POA: Diagnosis not present

## 2014-08-28 DIAGNOSIS — M810 Age-related osteoporosis without current pathological fracture: Secondary | ICD-10-CM | POA: Diagnosis not present

## 2014-08-28 DIAGNOSIS — J9601 Acute respiratory failure with hypoxia: Secondary | ICD-10-CM | POA: Diagnosis not present

## 2014-08-28 DIAGNOSIS — R2681 Unsteadiness on feet: Secondary | ICD-10-CM | POA: Diagnosis not present

## 2014-08-29 DIAGNOSIS — J9601 Acute respiratory failure with hypoxia: Secondary | ICD-10-CM | POA: Diagnosis not present

## 2014-08-29 DIAGNOSIS — R2681 Unsteadiness on feet: Secondary | ICD-10-CM | POA: Diagnosis not present

## 2014-08-29 DIAGNOSIS — M6281 Muscle weakness (generalized): Secondary | ICD-10-CM | POA: Diagnosis not present

## 2014-08-29 DIAGNOSIS — M810 Age-related osteoporosis without current pathological fracture: Secondary | ICD-10-CM | POA: Diagnosis not present

## 2014-08-30 DIAGNOSIS — R2681 Unsteadiness on feet: Secondary | ICD-10-CM | POA: Diagnosis not present

## 2014-08-30 DIAGNOSIS — M6281 Muscle weakness (generalized): Secondary | ICD-10-CM | POA: Diagnosis not present

## 2014-08-30 DIAGNOSIS — M810 Age-related osteoporosis without current pathological fracture: Secondary | ICD-10-CM | POA: Diagnosis not present

## 2014-08-30 DIAGNOSIS — J9601 Acute respiratory failure with hypoxia: Secondary | ICD-10-CM | POA: Diagnosis not present

## 2014-08-31 DIAGNOSIS — R2681 Unsteadiness on feet: Secondary | ICD-10-CM | POA: Diagnosis not present

## 2014-08-31 DIAGNOSIS — M6281 Muscle weakness (generalized): Secondary | ICD-10-CM | POA: Diagnosis not present

## 2014-08-31 DIAGNOSIS — M810 Age-related osteoporosis without current pathological fracture: Secondary | ICD-10-CM | POA: Diagnosis not present

## 2014-08-31 DIAGNOSIS — J9601 Acute respiratory failure with hypoxia: Secondary | ICD-10-CM | POA: Diagnosis not present

## 2014-09-01 DIAGNOSIS — R2681 Unsteadiness on feet: Secondary | ICD-10-CM | POA: Diagnosis not present

## 2014-09-01 DIAGNOSIS — M810 Age-related osteoporosis without current pathological fracture: Secondary | ICD-10-CM | POA: Diagnosis not present

## 2014-09-01 DIAGNOSIS — M6281 Muscle weakness (generalized): Secondary | ICD-10-CM | POA: Diagnosis not present

## 2014-09-01 DIAGNOSIS — J9601 Acute respiratory failure with hypoxia: Secondary | ICD-10-CM | POA: Diagnosis not present

## 2014-09-04 DIAGNOSIS — R2681 Unsteadiness on feet: Secondary | ICD-10-CM | POA: Diagnosis not present

## 2014-09-04 DIAGNOSIS — H40033 Anatomical narrow angle, bilateral: Secondary | ICD-10-CM | POA: Diagnosis not present

## 2014-09-04 DIAGNOSIS — M810 Age-related osteoporosis without current pathological fracture: Secondary | ICD-10-CM | POA: Diagnosis not present

## 2014-09-04 DIAGNOSIS — M6281 Muscle weakness (generalized): Secondary | ICD-10-CM | POA: Diagnosis not present

## 2014-09-04 DIAGNOSIS — H2513 Age-related nuclear cataract, bilateral: Secondary | ICD-10-CM | POA: Diagnosis not present

## 2014-09-04 DIAGNOSIS — H5203 Hypermetropia, bilateral: Secondary | ICD-10-CM | POA: Diagnosis not present

## 2014-09-04 DIAGNOSIS — J9601 Acute respiratory failure with hypoxia: Secondary | ICD-10-CM | POA: Diagnosis not present

## 2014-09-05 DIAGNOSIS — M6281 Muscle weakness (generalized): Secondary | ICD-10-CM | POA: Diagnosis not present

## 2014-09-05 DIAGNOSIS — R2681 Unsteadiness on feet: Secondary | ICD-10-CM | POA: Diagnosis not present

## 2014-09-05 DIAGNOSIS — J9601 Acute respiratory failure with hypoxia: Secondary | ICD-10-CM | POA: Diagnosis not present

## 2014-09-05 DIAGNOSIS — M810 Age-related osteoporosis without current pathological fracture: Secondary | ICD-10-CM | POA: Diagnosis not present

## 2014-09-06 DIAGNOSIS — J9601 Acute respiratory failure with hypoxia: Secondary | ICD-10-CM | POA: Diagnosis not present

## 2014-09-06 DIAGNOSIS — M6281 Muscle weakness (generalized): Secondary | ICD-10-CM | POA: Diagnosis not present

## 2014-09-06 DIAGNOSIS — R2681 Unsteadiness on feet: Secondary | ICD-10-CM | POA: Diagnosis not present

## 2014-09-06 DIAGNOSIS — M810 Age-related osteoporosis without current pathological fracture: Secondary | ICD-10-CM | POA: Diagnosis not present

## 2014-09-07 DIAGNOSIS — R2681 Unsteadiness on feet: Secondary | ICD-10-CM | POA: Diagnosis not present

## 2014-09-07 DIAGNOSIS — M6281 Muscle weakness (generalized): Secondary | ICD-10-CM | POA: Diagnosis not present

## 2014-09-07 DIAGNOSIS — M810 Age-related osteoporosis without current pathological fracture: Secondary | ICD-10-CM | POA: Diagnosis not present

## 2014-09-07 DIAGNOSIS — J9601 Acute respiratory failure with hypoxia: Secondary | ICD-10-CM | POA: Diagnosis not present

## 2014-09-08 DIAGNOSIS — R2681 Unsteadiness on feet: Secondary | ICD-10-CM | POA: Diagnosis not present

## 2014-09-08 DIAGNOSIS — M6281 Muscle weakness (generalized): Secondary | ICD-10-CM | POA: Diagnosis not present

## 2014-09-08 DIAGNOSIS — J9601 Acute respiratory failure with hypoxia: Secondary | ICD-10-CM | POA: Diagnosis not present

## 2014-09-08 DIAGNOSIS — R05 Cough: Secondary | ICD-10-CM | POA: Diagnosis not present

## 2014-09-08 DIAGNOSIS — M810 Age-related osteoporosis without current pathological fracture: Secondary | ICD-10-CM | POA: Diagnosis not present

## 2014-09-09 DIAGNOSIS — E119 Type 2 diabetes mellitus without complications: Secondary | ICD-10-CM | POA: Diagnosis not present

## 2014-09-09 DIAGNOSIS — R0602 Shortness of breath: Secondary | ICD-10-CM | POA: Diagnosis not present

## 2014-09-09 DIAGNOSIS — Z79899 Other long term (current) drug therapy: Secondary | ICD-10-CM | POA: Diagnosis not present

## 2014-09-09 DIAGNOSIS — D649 Anemia, unspecified: Secondary | ICD-10-CM | POA: Diagnosis not present

## 2014-09-09 DIAGNOSIS — E039 Hypothyroidism, unspecified: Secondary | ICD-10-CM | POA: Diagnosis not present

## 2014-09-09 DIAGNOSIS — E559 Vitamin D deficiency, unspecified: Secondary | ICD-10-CM | POA: Diagnosis not present

## 2014-09-11 DIAGNOSIS — R2681 Unsteadiness on feet: Secondary | ICD-10-CM | POA: Diagnosis not present

## 2014-09-11 DIAGNOSIS — J9601 Acute respiratory failure with hypoxia: Secondary | ICD-10-CM | POA: Diagnosis not present

## 2014-09-11 DIAGNOSIS — M6281 Muscle weakness (generalized): Secondary | ICD-10-CM | POA: Diagnosis not present

## 2014-09-11 DIAGNOSIS — M810 Age-related osteoporosis without current pathological fracture: Secondary | ICD-10-CM | POA: Diagnosis not present

## 2014-09-12 DIAGNOSIS — M6281 Muscle weakness (generalized): Secondary | ICD-10-CM | POA: Diagnosis not present

## 2014-09-12 DIAGNOSIS — J9601 Acute respiratory failure with hypoxia: Secondary | ICD-10-CM | POA: Diagnosis not present

## 2014-09-12 DIAGNOSIS — R2681 Unsteadiness on feet: Secondary | ICD-10-CM | POA: Diagnosis not present

## 2014-09-12 DIAGNOSIS — M810 Age-related osteoporosis without current pathological fracture: Secondary | ICD-10-CM | POA: Diagnosis not present

## 2014-09-13 DIAGNOSIS — R2681 Unsteadiness on feet: Secondary | ICD-10-CM | POA: Diagnosis not present

## 2014-09-13 DIAGNOSIS — M810 Age-related osteoporosis without current pathological fracture: Secondary | ICD-10-CM | POA: Diagnosis not present

## 2014-09-13 DIAGNOSIS — M6281 Muscle weakness (generalized): Secondary | ICD-10-CM | POA: Diagnosis not present

## 2014-09-13 DIAGNOSIS — J9601 Acute respiratory failure with hypoxia: Secondary | ICD-10-CM | POA: Diagnosis not present

## 2014-09-14 DIAGNOSIS — J9601 Acute respiratory failure with hypoxia: Secondary | ICD-10-CM | POA: Diagnosis not present

## 2014-09-14 DIAGNOSIS — R2681 Unsteadiness on feet: Secondary | ICD-10-CM | POA: Diagnosis not present

## 2014-09-14 DIAGNOSIS — H40033 Anatomical narrow angle, bilateral: Secondary | ICD-10-CM | POA: Diagnosis not present

## 2014-09-14 DIAGNOSIS — M6281 Muscle weakness (generalized): Secondary | ICD-10-CM | POA: Diagnosis not present

## 2014-09-14 DIAGNOSIS — M810 Age-related osteoporosis without current pathological fracture: Secondary | ICD-10-CM | POA: Diagnosis not present

## 2014-09-15 DIAGNOSIS — H40033 Anatomical narrow angle, bilateral: Secondary | ICD-10-CM | POA: Diagnosis not present

## 2014-09-16 DIAGNOSIS — M810 Age-related osteoporosis without current pathological fracture: Secondary | ICD-10-CM | POA: Diagnosis not present

## 2014-09-16 DIAGNOSIS — R2681 Unsteadiness on feet: Secondary | ICD-10-CM | POA: Diagnosis not present

## 2014-09-16 DIAGNOSIS — J9601 Acute respiratory failure with hypoxia: Secondary | ICD-10-CM | POA: Diagnosis not present

## 2014-09-16 DIAGNOSIS — M6281 Muscle weakness (generalized): Secondary | ICD-10-CM | POA: Diagnosis not present

## 2014-09-18 DIAGNOSIS — R2681 Unsteadiness on feet: Secondary | ICD-10-CM | POA: Diagnosis not present

## 2014-09-18 DIAGNOSIS — J9601 Acute respiratory failure with hypoxia: Secondary | ICD-10-CM | POA: Diagnosis not present

## 2014-09-18 DIAGNOSIS — M6281 Muscle weakness (generalized): Secondary | ICD-10-CM | POA: Diagnosis not present

## 2014-09-18 DIAGNOSIS — M810 Age-related osteoporosis without current pathological fracture: Secondary | ICD-10-CM | POA: Diagnosis not present

## 2014-09-27 DIAGNOSIS — Z79899 Other long term (current) drug therapy: Secondary | ICD-10-CM | POA: Diagnosis not present

## 2014-09-27 DIAGNOSIS — D649 Anemia, unspecified: Secondary | ICD-10-CM | POA: Diagnosis not present

## 2014-09-27 DIAGNOSIS — E119 Type 2 diabetes mellitus without complications: Secondary | ICD-10-CM | POA: Diagnosis not present

## 2014-09-27 DIAGNOSIS — E559 Vitamin D deficiency, unspecified: Secondary | ICD-10-CM | POA: Diagnosis not present

## 2014-09-27 DIAGNOSIS — E039 Hypothyroidism, unspecified: Secondary | ICD-10-CM | POA: Diagnosis not present

## 2014-09-27 DIAGNOSIS — E785 Hyperlipidemia, unspecified: Secondary | ICD-10-CM | POA: Diagnosis not present

## 2014-09-28 ENCOUNTER — Encounter: Payer: Self-pay | Admitting: Internal Medicine

## 2014-09-28 DIAGNOSIS — H40033 Anatomical narrow angle, bilateral: Secondary | ICD-10-CM | POA: Diagnosis not present

## 2014-09-28 DIAGNOSIS — I700 Atherosclerosis of aorta: Secondary | ICD-10-CM

## 2014-09-28 HISTORY — DX: Atherosclerosis of aorta: I70.0

## 2014-10-05 DIAGNOSIS — H40033 Anatomical narrow angle, bilateral: Secondary | ICD-10-CM | POA: Diagnosis not present

## 2014-10-06 DIAGNOSIS — N3946 Mixed incontinence: Secondary | ICD-10-CM | POA: Diagnosis not present

## 2014-10-07 DIAGNOSIS — R319 Hematuria, unspecified: Secondary | ICD-10-CM | POA: Diagnosis not present

## 2014-10-07 DIAGNOSIS — N390 Urinary tract infection, site not specified: Secondary | ICD-10-CM | POA: Diagnosis not present

## 2014-10-09 DIAGNOSIS — F3340 Major depressive disorder, recurrent, in remission, unspecified: Secondary | ICD-10-CM | POA: Diagnosis not present

## 2014-10-09 DIAGNOSIS — E871 Hypo-osmolality and hyponatremia: Secondary | ICD-10-CM | POA: Diagnosis not present

## 2014-10-09 DIAGNOSIS — J9601 Acute respiratory failure with hypoxia: Secondary | ICD-10-CM | POA: Diagnosis not present

## 2014-10-09 DIAGNOSIS — N39498 Other specified urinary incontinence: Secondary | ICD-10-CM | POA: Diagnosis not present

## 2014-10-31 DIAGNOSIS — D649 Anemia, unspecified: Secondary | ICD-10-CM | POA: Diagnosis not present

## 2014-11-08 ENCOUNTER — Encounter: Payer: Self-pay | Admitting: Internal Medicine

## 2014-11-08 ENCOUNTER — Ambulatory Visit (INDEPENDENT_AMBULATORY_CARE_PROVIDER_SITE_OTHER): Payer: Medicare Other | Admitting: Internal Medicine

## 2014-11-08 VITALS — BP 112/68 | HR 88 | Ht 67.0 in

## 2014-11-08 DIAGNOSIS — R110 Nausea: Secondary | ICD-10-CM | POA: Diagnosis not present

## 2014-11-08 DIAGNOSIS — R1310 Dysphagia, unspecified: Secondary | ICD-10-CM

## 2014-11-08 DIAGNOSIS — K219 Gastro-esophageal reflux disease without esophagitis: Secondary | ICD-10-CM | POA: Diagnosis not present

## 2014-11-08 MED ORDER — ONDANSETRON HCL 4 MG PO TABS: ORAL_TABLET | ORAL | Status: DC

## 2014-11-08 NOTE — Progress Notes (Signed)
HISTORY OF PRESENT ILLNESS:  [name] is a 69 y.o. female with multiple significant and advanced medical problems, including but not limited to end-stage lung disease, and morbid obesity with BMI in the 50 range sent today by Dr. Reynold Bowen regarding substernal burning and nausea. Pertinent outside records, hospitalization, x-ray, and endoscopy reports reviewed The patient was evaluated in this office 06/22/2013 for Hemoccult-positive stool and consideration of colonoscopy. She also complained of abdominal discomfort felt to be musculoskeletal and was noted to have incidental cholelithiasis. 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 The patient was not felt to be an appropriate candidate for colonoscopy due to her overall poor health. She was hospitalized earlier this year with respiratory failure. At one point, hospice was consulted given her overall poor prognosis. Currently living in a skilled nursing facility. She is accompanied today by her son Ronalee Belts. She is on multiple medications as listed. She tells me that she has had a several month history of substernal burning discomfort exacerbated by meals. Mild dysphagia. And chronic intermittent nausea. She states that she takes Phenergan for nausea, though I do not see this on her list. Other medications of interest are prednisone, doxycycline, iron, and hydrocodone. She is for the most part bed and wheelchair-bound.  REVIEW OF SYSTEMS:  All non-GI ROS negative except for back pain, arthritis, muscle aches, shortness of breath  Past Medical History  Diagnosis Date  . Chronic low back pain 11/05/2005    s/p lumbar stenosis decompression surgery with residual pain  . Arthritis of left knee 01/30/2012  . Fibromyalgia 01/30/2012  . Deep venous thrombosis of lower extremity ([place]) 11/26/2010    Left, pulmonary embolism 1 month later per patient report.  On life-long anticoagulation.   . Chronic venous insufficiency 01/30/2012    Left lower  extremity s/p DVT   . Morbid obesity with BMI of 40.0-44.9, adult ([place]) 01/02/2011  . Gastroesophageal reflux disease 01/30/2012  . 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 Benson Norway  . Major depression ([place]) 11/05/2005  . Anxiety 01/30/2012  . Hyperlipidemia LDL goal < 130 01/30/2012  . Urge urinary incontinence 03/25/2008    Urge predominant, minor component or stress.  Nocturnal.   . Obstructive sleep apnea 11/05/2005    Non-compliant with nocturnal nasal CPAP secondary to it being "ineffective"   . Tobacco abuse 11/05/2005  . Irritable bowel syndrome   . Interstitial cystitis     followed by Dr. Risa Grill  . Left cataract     Surgery pending  . Bell's palsy 1980's  . History of blood transfusion     During GAVE associated GI bleed  . Complication of anesthesia     Hard time putting patient to sleep  . Family history of anesthesia complication     "Mama couldn't wake up easy"  . Asymptomatic cholelithiasis 07/15/2013  . Melanoma of nose ([place]) 09/2001    "Left side"  . Breast cancer ([place])     Bilateral s/p mastectomies  . Aortic atherosclerosis ([place]) 09/28/2014    Seen on CT scan, currently asymptomatic    Past Surgical History  Procedure Laterality Date  . Cervical discectomy  11/1995 and 05/2002    by Dr. Hal Neer  . Abdominal hysterectomy  1976  . Bladder suspension  1993  . Orif ankle fracture  1993  . Lumbar laminectomy/decompression microdiscectomy  02/2007    by Dr. Hal Neer  . Carpal tunnel release Left   . Tonsillectomy and adenoidectomy  1953  . Appendectomy    . Dilation and curettage of uterus    . Tubal ligation    . Carpectomy  07/23/2011    Procedure: CARPECTOMY;  Surgeon: Linna Hoff, MD;  Location: [place];  Service: Orthopedics;  Laterality: Left;  LEFT WRIST PROXIMAL ROW CARPECTOMY  . Esophagogastroduodenoscopy  12/25/2011    Procedure: ESOPHAGOGASTRODUODENOSCOPY (EGD);  Surgeon: Beryle Beams, MD;  Location: [facility] ENDOSCOPY;  Service: Endoscopy;   Laterality: N/A;  . Breast surgery  1992    Bilateral mastectomies for breast cancer  . Rhinoplasty  1993  . Spine surgery    . Hot hemostasis  02/06/2012    Procedure: HOT HEMOSTASIS (ARGON PLASMA COAGULATION/BICAP);  Surgeon: Beryle Beams, MD;  Location: [facility] ENDOSCOPY;  Service: Endoscopy;  Laterality: N/A;  . Esophagogastroduodenoscopy  02/06/2012    Procedure: ESOPHAGOGASTRODUODENOSCOPY (EGD);  Surgeon: Beryle Beams, MD;  Location: [facility] ENDOSCOPY;  Service: Endoscopy;  Laterality: N/A;  . Esophagogastroduodenoscopy N/A 04/02/2012    Procedure: ESOPHAGOGASTRODUODENOSCOPY (EGD);  Surgeon: Beryle Beams, MD;  Location: [facility] ENDOSCOPY;  Service: Endoscopy;  Laterality: N/A;  EGD w/ APC  . Back surgery      x 3  . Tendon transfer Left 06/24/2012    "thumb" (06/24/2012)  . Mastectomy Bilateral   . Melanoma excision Left     "side of my nose" (06/24/2012)  . Tendon transfer Left 06/24/2012    Procedure: LEFT THUMB EIP TO EPL TENDON TRANSFER;  Surgeon: Linna Hoff, MD;  Location: [facility];  Service: Orthopedics;  Laterality: Left;    Social History RHEBA DIAMOND  reports that she quit smoking about 7 months ago. Her smoking use included Cigarettes. She has a 20 pack-year smoking history. She has never used smokeless tobacco. She reports that she does not drink alcohol or use illicit drugs.  family history includes Alcohol abuse in her father; Anxiety disorder in her son; Cancer - Lung in her brother; Deep vein thrombosis in her mother; Healthy in her brother and brother; Heart disease in her mother; Schizophrenia in her father.  Allergies  Allergen Reactions  . Adhesive [Tape] Other (See Comments)    "I break out all over"; Can only use paper tape  . Codeine Other (See Comments)    "first dose I took, my aide found me the next day laying on the floor"  . Morphine Other (See Comments)    caused change in mental status, she does not want to try it again.  . Sulfonamide Derivatives Nausea And  Vomiting  . Thorazine [Chlorpromazine] Nausea Only       PHYSICAL EXAMINATION: Vital signs: BP 112/68 mmHg  Pulse 88  Ht $R'5\' 7"'JN$  (1.702 m)  Wt  General: Chronically ill appearing, obese, wheelchair-bound, no acute distress but fatigued appearing HEENT: Sclerae are anicteric, conjunctiva pink. Oral mucosa intact without obvious thrush Lungs: Clear with distant breath sounds Heart: Regular Abdomen: soft, massively obese, nontender, nondistended, no obvious ascites, no peritoneal signs, normal bowel sounds. No organomegaly. Extremities: No clubbing or cyanosis. 1+ edema bilaterally Psychiatric: alert and oriented x3. Cooperative   ASSESSMENT:  #1. Complaints of substernal burning discomfort exacerbated by meals. Most likely causes include suboptimally controlled GERD. Alternatively, she could have esophageal candidiasis given her chronic steroids and antibiotic exposure #2. Nausea. Multifactorial. May be related to her myriad of medical problems and/or myriad of medications #3. Mild intermittent dysphagia. Would not pursue this further as the patient is neither an endoscopy or surgery candidate  PLAN:  #1. Increase pantoprazole to 40 mg twice daily #2. Prescribed Mycelex [place] one 5 times daily for 10 days. Repeat on demand if needed #3. Prescribe Zofran 4 mg every 4-6 hours as needed for nausea #4. Return to the care of Dr. Forde Dandy at the skilled nursing facility  A copy has been sent to Dr. Roque Cash

## 2014-11-10 DIAGNOSIS — N390 Urinary tract infection, site not specified: Secondary | ICD-10-CM | POA: Diagnosis not present

## 2014-11-10 DIAGNOSIS — R319 Hematuria, unspecified: Secondary | ICD-10-CM | POA: Diagnosis not present

## 2014-11-15 DIAGNOSIS — E119 Type 2 diabetes mellitus without complications: Secondary | ICD-10-CM | POA: Diagnosis not present

## 2014-11-28 DIAGNOSIS — R1314 Dysphagia, pharyngoesophageal phase: Secondary | ICD-10-CM | POA: Diagnosis not present

## 2014-11-29 DIAGNOSIS — R1314 Dysphagia, pharyngoesophageal phase: Secondary | ICD-10-CM | POA: Diagnosis not present

## 2014-11-30 DIAGNOSIS — R1314 Dysphagia, pharyngoesophageal phase: Secondary | ICD-10-CM | POA: Diagnosis not present

## 2014-12-01 DIAGNOSIS — R1314 Dysphagia, pharyngoesophageal phase: Secondary | ICD-10-CM | POA: Diagnosis not present

## 2014-12-03 DIAGNOSIS — R1314 Dysphagia, pharyngoesophageal phase: Secondary | ICD-10-CM | POA: Diagnosis not present

## 2014-12-04 DIAGNOSIS — H40033 Anatomical narrow angle, bilateral: Secondary | ICD-10-CM | POA: Diagnosis not present

## 2014-12-04 DIAGNOSIS — H2513 Age-related nuclear cataract, bilateral: Secondary | ICD-10-CM | POA: Diagnosis not present

## 2014-12-04 DIAGNOSIS — H52223 Regular astigmatism, bilateral: Secondary | ICD-10-CM | POA: Diagnosis not present

## 2014-12-04 DIAGNOSIS — H524 Presbyopia: Secondary | ICD-10-CM | POA: Diagnosis not present

## 2014-12-04 DIAGNOSIS — H5203 Hypermetropia, bilateral: Secondary | ICD-10-CM | POA: Diagnosis not present

## 2014-12-05 DIAGNOSIS — R1314 Dysphagia, pharyngoesophageal phase: Secondary | ICD-10-CM | POA: Diagnosis not present

## 2014-12-06 DIAGNOSIS — R1314 Dysphagia, pharyngoesophageal phase: Secondary | ICD-10-CM | POA: Diagnosis not present

## 2014-12-07 ENCOUNTER — Encounter: Payer: Self-pay | Admitting: Internal Medicine

## 2014-12-07 ENCOUNTER — Ambulatory Visit (INDEPENDENT_AMBULATORY_CARE_PROVIDER_SITE_OTHER): Payer: Medicare Other | Admitting: Internal Medicine

## 2014-12-07 VITALS — BP 130/70 | HR 87 | Ht 67.0 in | Wt 309.0 lb

## 2014-12-07 DIAGNOSIS — R0789 Other chest pain: Secondary | ICD-10-CM | POA: Diagnosis not present

## 2014-12-07 DIAGNOSIS — R1310 Dysphagia, unspecified: Secondary | ICD-10-CM

## 2014-12-07 DIAGNOSIS — R070 Pain in throat: Secondary | ICD-10-CM

## 2014-12-07 DIAGNOSIS — K219 Gastro-esophageal reflux disease without esophagitis: Secondary | ICD-10-CM | POA: Diagnosis not present

## 2014-12-07 DIAGNOSIS — R1314 Dysphagia, pharyngoesophageal phase: Secondary | ICD-10-CM | POA: Diagnosis not present

## 2014-12-07 MED ORDER — FLUCONAZOLE 100 MG PO TABS: 100.0000 mg | ORAL_TABLET | Freq: Every day | ORAL | Status: DC

## 2014-12-07 NOTE — Progress Notes (Signed)
HISTORY OF PRESENT ILLNESS:  [name] is a 69 y.o. 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 She was evaluated for weeks ago with complaints of substernal burning exacerbated by meals, nausea, and mild dysphagia. See that dictation for details. Her pantoprazole was increased to 40 mg twice daily. She was empirically prescribed Mycelex troche for possible esophageal candidiasis (on chronic prednisone and antibiotics) and Zofran for nausea. Because of ongoing complaints, the nursing home contacted the office last week and scheduled this appointment. She is accompanied by her son Ronalee Belts. Patient reports that her nausea is better. She continues to report problems with pharyngeal soreness. She thinks that the oxygen is causing dryness of her throat. She also reports substernal burning discomfort with meals. However, this is inconsistent. For example, she is currently drinking Pepsi without issues. She tells me that certain items such as soup, grilled cheese, and milkshakes cause no problems. No other complaints or issues that are relevant  REVIEW OF SYSTEMS:  All non-GI ROS negative except for cough, depression, fatigue, headaches, corrected vision  Past Medical History  Diagnosis Date  . Chronic low back pain 11/05/2005    s/p lumbar stenosis decompression surgery with residual pain  . Arthritis of left knee 01/30/2012  . Fibromyalgia 01/30/2012  . Deep venous thrombosis of lower extremity ([place]) 11/26/2010    Left, pulmonary embolism 1 month later per patient report.  On life-long anticoagulation.   . Chronic venous insufficiency 01/30/2012    Left lower extremity s/p DVT   . Morbid obesity with BMI of 40.0-44.9, adult ([place]) 01/02/2011  . Gastroesophageal reflux disease 01/30/2012  . 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 Benson Norway  .  Major depression ([place]) 11/05/2005  . Anxiety 01/30/2012  . Hyperlipidemia LDL goal < 130 01/30/2012  . Urge urinary incontinence 03/25/2008    Urge predominant, minor component or stress.  Nocturnal.   . Obstructive sleep apnea 11/05/2005    Non-compliant with nocturnal nasal CPAP secondary to it being "ineffective"   . Tobacco abuse 11/05/2005  . Irritable bowel syndrome   . Interstitial cystitis     followed by Dr. Risa Grill  . Left cataract     Surgery pending  . Bell's palsy 1980's  . History of blood transfusion     During GAVE associated GI bleed  . Complication of anesthesia     Hard time putting patient to sleep  . Family history of anesthesia complication     "Mama couldn't wake up easy"  . Asymptomatic cholelithiasis 07/15/2013  . Melanoma of nose ([place]) 09/2001    "Left side"  . Breast cancer ([place])     Bilateral s/p mastectomies  . Aortic atherosclerosis ([place]) 09/28/2014    Seen on CT scan, currently asymptomatic    Past Surgical History  Procedure Laterality Date  . Cervical discectomy  11/1995 and 05/2002    by Dr. Hal Neer  . Abdominal hysterectomy  1976  . Bladder suspension  1993  . Orif ankle fracture  1993  . Lumbar laminectomy/decompression microdiscectomy  02/2007    by Dr. Hal Neer  . Carpal tunnel release Left   . Tonsillectomy and adenoidectomy  1953  . Appendectomy    . Dilation and curettage of uterus    . Tubal ligation    . Carpectomy  07/23/2011    Procedure: CARPECTOMY;  Surgeon:  Linna Hoff, MD;  Location: [facility];  Service: Orthopedics;  Laterality: Left;  LEFT WRIST PROXIMAL ROW CARPECTOMY  . Esophagogastroduodenoscopy  12/25/2011    Procedure: ESOPHAGOGASTRODUODENOSCOPY (EGD);  Surgeon: Beryle Beams, MD;  Location: [facility]COPY;  Service: Endoscopy;  Laterality: N/A;  . Breast surgery  1992    Bilateral mastectomies for breast cancer  . Rhinoplasty  1993  . Spine surgery    . Hot hemostasis  02/06/2012    Procedure: HOT HEMOSTASIS (ARGON PLASMA  COAGULATION/BICAP);  Surgeon: Beryle Beams, MD;  Location: [facility];  Service: Endoscopy;  Laterality: N/A;  . Esophagogastroduodenoscopy  02/06/2012    Procedure: ESOPHAGOGASTRODUODENOSCOPY (EGD);  Surgeon: Beryle Beams, MD;  Location: [facility];  Service: Endoscopy;  Laterality: N/A;  . Esophagogastroduodenoscopy N/A 04/02/2012    Procedure: ESOPHAGOGASTRODUODENOSCOPY (EGD);  Surgeon: Beryle Beams, MD;  Location: [facility];  Service: Endoscopy;  Laterality: N/A;  EGD w/ APC  . Back surgery      x 3  . Tendon transfer Left 06/24/2012    "thumb" (06/24/2012)  . Mastectomy Bilateral   . Melanoma excision Left     "side of my nose" (06/24/2012)  . Tendon transfer Left 06/24/2012    Procedure: LEFT THUMB EIP TO EPL TENDON TRANSFER;  Surgeon: Linna Hoff, MD;  Location: [facility];  Service: Orthopedics;  Laterality: Left;    Social History JERMERIA TELLES  reports that she quit smoking about 8 months ago. Her smoking use included Cigarettes. She has a 20 pack-year smoking history. She has never used smokeless tobacco. She reports that she does not drink alcohol or use illicit drugs.  family history includes Alcohol abuse in her father; Anxiety disorder in her son; Cancer - Lung in her brother; Deep vein thrombosis in her mother; Healthy in her brother and brother; Heart disease in her mother; Schizophrenia in her father.  Allergies  Allergen Reactions  . Adhesive [Tape] Other (See Comments)    "I break out all over"; Can only use paper tape  . Codeine Other (See Comments)    "first dose I took, my aide found me the next day laying on the floor"  . Morphine Other (See Comments)    caused change in mental status, she does not want to try it again.  . Sulfonamide Derivatives Nausea And Vomiting  . Thorazine [Chlorpromazine] Nausea Only       PHYSICAL EXAMINATION: Vital signs: BP 130/70 mmHg  Pulse 87  Ht 5\' 7"  (1.702 m)  Wt 309 lb (140.161 kg)  BMI 48.38 kg/m2  SpO2  99% General: Morbidly obese, wheelchair-bound, nasal oxygen in place, no acute distress HEENT: Sclerae are anicteric, conjunctiva pink. Oral mucosa intact. No thrush in the posterior pharynx or other abnormality Lungs: Clear Heart: Regular Abdomen: soft, obese, nontender, nondistended, no obvious ascites, no peritoneal signs, normal bowel sounds. No organomegaly. Extremities: No clubbing cyanosis; 1+ edema Psychiatric: alert and oriented x3. Cooperative   ASSESSMENT:  #1. Ongoing complaints of pharyngeal and substernal burning discomfort intermittently with meals as described. Possible causes include dryness from chronic oxygen therapy, esophageal candidiasis in a patient on chronic steroids, medication reaction, or GERD yet to respond to high-dose PPI #2. Problems with nausea. Multifactorial. Improved with Zofran since last visit.  PLAN:  #1. Continue pantoprazole 40 mg twice a day #2. Reflux precautions as much as reasonable #3. Prescribed Diflucan 100 mg daily for 7 days for possible refractory esophageal candidiasis (comorbidities prevent diagnostic endoscopy) #4. Hold Fosamax  as this can cause GI burning discomfort #5. Hold doxycycline (if still being taken) as this can cause GI burning discomfort #6. If problems persist despite all these measures consider diagnostic barium esophagram to rule out mucosal abnormality suggesting infection, reflux inflammation, or neoplasia

## 2014-12-07 NOTE — Patient Instructions (Signed)
You have been given a prescription for Diflucan.  Continue your Pantoprazole 40mg , twice a day  Hold your Fosamax - this can can cause burning   Hold our doxycycline if still taking

## 2014-12-08 DIAGNOSIS — R1314 Dysphagia, pharyngoesophageal phase: Secondary | ICD-10-CM | POA: Diagnosis not present

## 2014-12-11 DIAGNOSIS — R1314 Dysphagia, pharyngoesophageal phase: Secondary | ICD-10-CM | POA: Diagnosis not present

## 2014-12-26 DIAGNOSIS — F3340 Major depressive disorder, recurrent, in remission, unspecified: Secondary | ICD-10-CM | POA: Diagnosis not present

## 2014-12-26 DIAGNOSIS — N39498 Other specified urinary incontinence: Secondary | ICD-10-CM | POA: Diagnosis not present

## 2014-12-26 DIAGNOSIS — G4733 Obstructive sleep apnea (adult) (pediatric): Secondary | ICD-10-CM | POA: Diagnosis not present

## 2014-12-26 DIAGNOSIS — J9601 Acute respiratory failure with hypoxia: Secondary | ICD-10-CM | POA: Diagnosis not present

## 2014-12-26 DIAGNOSIS — E871 Hypo-osmolality and hyponatremia: Secondary | ICD-10-CM | POA: Diagnosis not present

## 2014-12-27 DIAGNOSIS — N3945 Continuous leakage: Secondary | ICD-10-CM | POA: Diagnosis not present

## 2014-12-27 DIAGNOSIS — N3942 Incontinence without sensory awareness: Secondary | ICD-10-CM | POA: Diagnosis not present

## 2015-01-03 DIAGNOSIS — R0600 Dyspnea, unspecified: Secondary | ICD-10-CM | POA: Diagnosis not present

## 2015-01-03 DIAGNOSIS — R0989 Other specified symptoms and signs involving the circulatory and respiratory systems: Secondary | ICD-10-CM | POA: Diagnosis not present

## 2015-01-03 DIAGNOSIS — Z9981 Dependence on supplemental oxygen: Secondary | ICD-10-CM | POA: Diagnosis not present

## 2015-01-03 DIAGNOSIS — R05 Cough: Secondary | ICD-10-CM | POA: Diagnosis not present

## 2015-01-04 DIAGNOSIS — R0600 Dyspnea, unspecified: Secondary | ICD-10-CM | POA: Diagnosis not present

## 2015-01-04 DIAGNOSIS — R05 Cough: Secondary | ICD-10-CM | POA: Diagnosis not present

## 2015-01-04 DIAGNOSIS — J189 Pneumonia, unspecified organism: Secondary | ICD-10-CM | POA: Diagnosis not present

## 2015-02-02 DIAGNOSIS — N390 Urinary tract infection, site not specified: Secondary | ICD-10-CM | POA: Diagnosis not present

## 2015-02-16 DIAGNOSIS — G4733 Obstructive sleep apnea (adult) (pediatric): Secondary | ICD-10-CM | POA: Diagnosis not present

## 2015-02-16 DIAGNOSIS — M545 Low back pain: Secondary | ICD-10-CM | POA: Diagnosis not present

## 2015-02-16 DIAGNOSIS — J9601 Acute respiratory failure with hypoxia: Secondary | ICD-10-CM | POA: Diagnosis not present

## 2015-02-20 DIAGNOSIS — N3946 Mixed incontinence: Secondary | ICD-10-CM | POA: Diagnosis not present

## 2015-02-20 DIAGNOSIS — N3942 Incontinence without sensory awareness: Secondary | ICD-10-CM | POA: Diagnosis not present

## 2015-02-20 DIAGNOSIS — N3945 Continuous leakage: Secondary | ICD-10-CM | POA: Diagnosis not present

## 2015-03-05 DIAGNOSIS — R05 Cough: Secondary | ICD-10-CM | POA: Diagnosis not present

## 2015-03-09 DIAGNOSIS — J189 Pneumonia, unspecified organism: Secondary | ICD-10-CM | POA: Diagnosis not present

## 2015-03-09 DIAGNOSIS — J9601 Acute respiratory failure with hypoxia: Secondary | ICD-10-CM | POA: Diagnosis not present

## 2015-03-09 DIAGNOSIS — Z9981 Dependence on supplemental oxygen: Secondary | ICD-10-CM | POA: Diagnosis not present

## 2015-03-15 DIAGNOSIS — K219 Gastro-esophageal reflux disease without esophagitis: Secondary | ICD-10-CM | POA: Diagnosis not present

## 2015-03-15 DIAGNOSIS — K1120 Sialoadenitis, unspecified: Secondary | ICD-10-CM | POA: Diagnosis not present

## 2015-03-15 DIAGNOSIS — G4733 Obstructive sleep apnea (adult) (pediatric): Secondary | ICD-10-CM | POA: Diagnosis not present

## 2015-03-15 DIAGNOSIS — Z9981 Dependence on supplemental oxygen: Secondary | ICD-10-CM | POA: Diagnosis not present

## 2015-03-26 DIAGNOSIS — M25561 Pain in right knee: Secondary | ICD-10-CM | POA: Diagnosis not present

## 2015-03-26 DIAGNOSIS — R2689 Other abnormalities of gait and mobility: Secondary | ICD-10-CM | POA: Diagnosis not present

## 2015-03-26 DIAGNOSIS — M6281 Muscle weakness (generalized): Secondary | ICD-10-CM | POA: Diagnosis not present

## 2015-03-26 DIAGNOSIS — R1314 Dysphagia, pharyngoesophageal phase: Secondary | ICD-10-CM | POA: Diagnosis not present

## 2015-03-27 DIAGNOSIS — E559 Vitamin D deficiency, unspecified: Secondary | ICD-10-CM | POA: Diagnosis not present

## 2015-03-27 DIAGNOSIS — D649 Anemia, unspecified: Secondary | ICD-10-CM | POA: Diagnosis not present

## 2015-03-27 DIAGNOSIS — E039 Hypothyroidism, unspecified: Secondary | ICD-10-CM | POA: Diagnosis not present

## 2015-03-27 DIAGNOSIS — E119 Type 2 diabetes mellitus without complications: Secondary | ICD-10-CM | POA: Diagnosis not present

## 2015-03-28 DIAGNOSIS — M25561 Pain in right knee: Secondary | ICD-10-CM | POA: Diagnosis not present

## 2015-03-28 DIAGNOSIS — R2689 Other abnormalities of gait and mobility: Secondary | ICD-10-CM | POA: Diagnosis not present

## 2015-03-28 DIAGNOSIS — M6281 Muscle weakness (generalized): Secondary | ICD-10-CM | POA: Diagnosis not present

## 2015-03-28 DIAGNOSIS — R1314 Dysphagia, pharyngoesophageal phase: Secondary | ICD-10-CM | POA: Diagnosis not present

## 2015-03-29 DIAGNOSIS — R2689 Other abnormalities of gait and mobility: Secondary | ICD-10-CM | POA: Diagnosis not present

## 2015-03-29 DIAGNOSIS — M6281 Muscle weakness (generalized): Secondary | ICD-10-CM | POA: Diagnosis not present

## 2015-03-29 DIAGNOSIS — R1314 Dysphagia, pharyngoesophageal phase: Secondary | ICD-10-CM | POA: Diagnosis not present

## 2015-03-29 DIAGNOSIS — M25561 Pain in right knee: Secondary | ICD-10-CM | POA: Diagnosis not present

## 2015-03-30 DIAGNOSIS — M25561 Pain in right knee: Secondary | ICD-10-CM | POA: Diagnosis not present

## 2015-03-30 DIAGNOSIS — M6281 Muscle weakness (generalized): Secondary | ICD-10-CM | POA: Diagnosis not present

## 2015-03-30 DIAGNOSIS — R2689 Other abnormalities of gait and mobility: Secondary | ICD-10-CM | POA: Diagnosis not present

## 2015-03-30 DIAGNOSIS — R1314 Dysphagia, pharyngoesophageal phase: Secondary | ICD-10-CM | POA: Diagnosis not present

## 2015-04-01 DIAGNOSIS — M25561 Pain in right knee: Secondary | ICD-10-CM | POA: Diagnosis not present

## 2015-04-01 DIAGNOSIS — R1314 Dysphagia, pharyngoesophageal phase: Secondary | ICD-10-CM | POA: Diagnosis not present

## 2015-04-01 DIAGNOSIS — M6281 Muscle weakness (generalized): Secondary | ICD-10-CM | POA: Diagnosis not present

## 2015-04-01 DIAGNOSIS — R2689 Other abnormalities of gait and mobility: Secondary | ICD-10-CM | POA: Diagnosis not present

## 2015-04-02 DIAGNOSIS — M25561 Pain in right knee: Secondary | ICD-10-CM | POA: Diagnosis not present

## 2015-04-02 DIAGNOSIS — M6281 Muscle weakness (generalized): Secondary | ICD-10-CM | POA: Diagnosis not present

## 2015-04-02 DIAGNOSIS — R1314 Dysphagia, pharyngoesophageal phase: Secondary | ICD-10-CM | POA: Diagnosis not present

## 2015-04-02 DIAGNOSIS — R2689 Other abnormalities of gait and mobility: Secondary | ICD-10-CM | POA: Diagnosis not present

## 2015-04-03 DIAGNOSIS — R2689 Other abnormalities of gait and mobility: Secondary | ICD-10-CM | POA: Diagnosis not present

## 2015-04-03 DIAGNOSIS — M25561 Pain in right knee: Secondary | ICD-10-CM | POA: Diagnosis not present

## 2015-04-03 DIAGNOSIS — R1314 Dysphagia, pharyngoesophageal phase: Secondary | ICD-10-CM | POA: Diagnosis not present

## 2015-04-03 DIAGNOSIS — M6281 Muscle weakness (generalized): Secondary | ICD-10-CM | POA: Diagnosis not present

## 2015-04-04 DIAGNOSIS — R1314 Dysphagia, pharyngoesophageal phase: Secondary | ICD-10-CM | POA: Diagnosis not present

## 2015-04-04 DIAGNOSIS — M6281 Muscle weakness (generalized): Secondary | ICD-10-CM | POA: Diagnosis not present

## 2015-04-04 DIAGNOSIS — M25561 Pain in right knee: Secondary | ICD-10-CM | POA: Diagnosis not present

## 2015-04-04 DIAGNOSIS — R2689 Other abnormalities of gait and mobility: Secondary | ICD-10-CM | POA: Diagnosis not present

## 2015-04-05 DIAGNOSIS — M6281 Muscle weakness (generalized): Secondary | ICD-10-CM | POA: Diagnosis not present

## 2015-04-05 DIAGNOSIS — R2689 Other abnormalities of gait and mobility: Secondary | ICD-10-CM | POA: Diagnosis not present

## 2015-04-05 DIAGNOSIS — R1314 Dysphagia, pharyngoesophageal phase: Secondary | ICD-10-CM | POA: Diagnosis not present

## 2015-04-05 DIAGNOSIS — M25561 Pain in right knee: Secondary | ICD-10-CM | POA: Diagnosis not present

## 2015-04-06 DIAGNOSIS — R2689 Other abnormalities of gait and mobility: Secondary | ICD-10-CM | POA: Diagnosis not present

## 2015-04-06 DIAGNOSIS — R1314 Dysphagia, pharyngoesophageal phase: Secondary | ICD-10-CM | POA: Diagnosis not present

## 2015-04-06 DIAGNOSIS — M25561 Pain in right knee: Secondary | ICD-10-CM | POA: Diagnosis not present

## 2015-04-06 DIAGNOSIS — M6281 Muscle weakness (generalized): Secondary | ICD-10-CM | POA: Diagnosis not present

## 2015-04-07 DIAGNOSIS — M6281 Muscle weakness (generalized): Secondary | ICD-10-CM | POA: Diagnosis not present

## 2015-04-07 DIAGNOSIS — M25561 Pain in right knee: Secondary | ICD-10-CM | POA: Diagnosis not present

## 2015-04-07 DIAGNOSIS — R1314 Dysphagia, pharyngoesophageal phase: Secondary | ICD-10-CM | POA: Diagnosis not present

## 2015-04-07 DIAGNOSIS — R2689 Other abnormalities of gait and mobility: Secondary | ICD-10-CM | POA: Diagnosis not present

## 2015-04-09 DIAGNOSIS — M6281 Muscle weakness (generalized): Secondary | ICD-10-CM | POA: Diagnosis not present

## 2015-04-09 DIAGNOSIS — M25561 Pain in right knee: Secondary | ICD-10-CM | POA: Diagnosis not present

## 2015-04-09 DIAGNOSIS — R1314 Dysphagia, pharyngoesophageal phase: Secondary | ICD-10-CM | POA: Diagnosis not present

## 2015-04-09 DIAGNOSIS — R2689 Other abnormalities of gait and mobility: Secondary | ICD-10-CM | POA: Diagnosis not present

## 2015-04-10 DIAGNOSIS — R05 Cough: Secondary | ICD-10-CM | POA: Diagnosis not present

## 2015-04-10 DIAGNOSIS — M25561 Pain in right knee: Secondary | ICD-10-CM | POA: Diagnosis not present

## 2015-04-10 DIAGNOSIS — R1314 Dysphagia, pharyngoesophageal phase: Secondary | ICD-10-CM | POA: Diagnosis not present

## 2015-04-10 DIAGNOSIS — M6281 Muscle weakness (generalized): Secondary | ICD-10-CM | POA: Diagnosis not present

## 2015-04-10 DIAGNOSIS — R2689 Other abnormalities of gait and mobility: Secondary | ICD-10-CM | POA: Diagnosis not present

## 2015-04-12 DIAGNOSIS — H40033 Anatomical narrow angle, bilateral: Secondary | ICD-10-CM | POA: Diagnosis not present

## 2015-04-13 DIAGNOSIS — R1314 Dysphagia, pharyngoesophageal phase: Secondary | ICD-10-CM | POA: Diagnosis not present

## 2015-04-13 DIAGNOSIS — M6281 Muscle weakness (generalized): Secondary | ICD-10-CM | POA: Diagnosis not present

## 2015-04-13 DIAGNOSIS — M25561 Pain in right knee: Secondary | ICD-10-CM | POA: Diagnosis not present

## 2015-04-13 DIAGNOSIS — R2689 Other abnormalities of gait and mobility: Secondary | ICD-10-CM | POA: Diagnosis not present

## 2015-05-19 ENCOUNTER — Emergency Department (HOSPITAL_COMMUNITY): Payer: Medicare Other

## 2015-05-19 ENCOUNTER — Inpatient Hospital Stay (HOSPITAL_COMMUNITY)
Admission: EM | Admit: 2015-05-19 | Discharge: 2015-05-28 | DRG: 196 | Disposition: A | Discharge status: Hospice | Payer: Medicare Other | Attending: Internal Medicine | Admitting: Internal Medicine

## 2015-05-19 ENCOUNTER — Encounter (HOSPITAL_COMMUNITY): Payer: Self-pay | Admitting: Emergency Medicine

## 2015-05-19 DIAGNOSIS — J440 Chronic obstructive pulmonary disease with acute lower respiratory infection: Secondary | ICD-10-CM | POA: Diagnosis not present

## 2015-05-19 DIAGNOSIS — G8929 Other chronic pain: Secondary | ICD-10-CM | POA: Diagnosis present

## 2015-05-19 DIAGNOSIS — Z86718 Personal history of other venous thrombosis and embolism: Secondary | ICD-10-CM

## 2015-05-19 DIAGNOSIS — R739 Hyperglycemia, unspecified: Secondary | ICD-10-CM | POA: Diagnosis not present

## 2015-05-19 DIAGNOSIS — J8410 Pulmonary fibrosis, unspecified: Principal | ICD-10-CM | POA: Diagnosis present

## 2015-05-19 DIAGNOSIS — K589 Irritable bowel syndrome without diarrhea: Secondary | ICD-10-CM | POA: Diagnosis present

## 2015-05-19 DIAGNOSIS — R0600 Dyspnea, unspecified: Secondary | ICD-10-CM | POA: Diagnosis present

## 2015-05-19 DIAGNOSIS — Z87891 Personal history of nicotine dependence: Secondary | ICD-10-CM

## 2015-05-19 DIAGNOSIS — Z66 Do not resuscitate: Secondary | ICD-10-CM | POA: Diagnosis not present

## 2015-05-19 DIAGNOSIS — J849 Interstitial pulmonary disease, unspecified: Secondary | ICD-10-CM

## 2015-05-19 DIAGNOSIS — F419 Anxiety disorder, unspecified: Secondary | ICD-10-CM | POA: Diagnosis present

## 2015-05-19 DIAGNOSIS — G4733 Obstructive sleep apnea (adult) (pediatric): Secondary | ICD-10-CM | POA: Diagnosis present

## 2015-05-19 DIAGNOSIS — M797 Fibromyalgia: Secondary | ICD-10-CM | POA: Diagnosis not present

## 2015-05-19 DIAGNOSIS — F339 Major depressive disorder, recurrent, unspecified: Secondary | ICD-10-CM | POA: Diagnosis present

## 2015-05-19 DIAGNOSIS — Z7951 Long term (current) use of inhaled steroids: Secondary | ICD-10-CM | POA: Diagnosis not present

## 2015-05-19 DIAGNOSIS — R0602 Shortness of breath: Secondary | ICD-10-CM | POA: Diagnosis not present

## 2015-05-19 DIAGNOSIS — R0902 Hypoxemia: Secondary | ICD-10-CM | POA: Diagnosis not present

## 2015-05-19 DIAGNOSIS — Z9981 Dependence on supplemental oxygen: Secondary | ICD-10-CM

## 2015-05-19 DIAGNOSIS — J9621 Acute and chronic respiratory failure with hypoxia: Secondary | ICD-10-CM | POA: Diagnosis not present

## 2015-05-19 DIAGNOSIS — Z853 Personal history of malignant neoplasm of breast: Secondary | ICD-10-CM

## 2015-05-19 DIAGNOSIS — E785 Hyperlipidemia, unspecified: Secondary | ICD-10-CM | POA: Diagnosis not present

## 2015-05-19 DIAGNOSIS — Z86711 Personal history of pulmonary embolism: Secondary | ICD-10-CM | POA: Diagnosis not present

## 2015-05-19 DIAGNOSIS — J189 Pneumonia, unspecified organism: Secondary | ICD-10-CM | POA: Diagnosis not present

## 2015-05-19 DIAGNOSIS — Z85820 Personal history of malignant melanoma of skin: Secondary | ICD-10-CM | POA: Diagnosis not present

## 2015-05-19 DIAGNOSIS — Z7952 Long term (current) use of systemic steroids: Secondary | ICD-10-CM

## 2015-05-19 DIAGNOSIS — Y95 Nosocomial condition: Secondary | ICD-10-CM | POA: Diagnosis present

## 2015-05-19 DIAGNOSIS — Z79899 Other long term (current) drug therapy: Secondary | ICD-10-CM | POA: Diagnosis not present

## 2015-05-19 DIAGNOSIS — Z6841 Body Mass Index (BMI) 40.0 and over, adult: Secondary | ICD-10-CM

## 2015-05-19 DIAGNOSIS — M545 Low back pain: Secondary | ICD-10-CM | POA: Diagnosis not present

## 2015-05-19 DIAGNOSIS — T380X5A Adverse effect of glucocorticoids and synthetic analogues, initial encounter: Secondary | ICD-10-CM | POA: Diagnosis not present

## 2015-05-19 DIAGNOSIS — K219 Gastro-esophageal reflux disease without esophagitis: Secondary | ICD-10-CM | POA: Diagnosis present

## 2015-05-19 DIAGNOSIS — Z7901 Long term (current) use of anticoagulants: Secondary | ICD-10-CM | POA: Diagnosis not present

## 2015-05-19 DIAGNOSIS — Z515 Encounter for palliative care: Secondary | ICD-10-CM | POA: Diagnosis not present

## 2015-05-19 DIAGNOSIS — D509 Iron deficiency anemia, unspecified: Secondary | ICD-10-CM | POA: Diagnosis present

## 2015-05-19 DIAGNOSIS — J9600 Acute respiratory failure, unspecified whether with hypoxia or hypercapnia: Secondary | ICD-10-CM | POA: Diagnosis not present

## 2015-05-19 LAB — BASIC METABOLIC PANEL
Anion gap: 10 (ref 5–15)
BUN: 9 mg/dL (ref 6–20)
CALCIUM: 9 mg/dL (ref 8.9–10.3)
CO2: 28 mmol/L (ref 22–32)
Chloride: 102 mmol/L (ref 101–111)
Creatinine, Ser: 0.74 mg/dL (ref 0.44–1.00)
Glucose, Bld: 125 mg/dL — ABNORMAL HIGH (ref 65–99)
POTASSIUM: 3.8 mmol/L (ref 3.5–5.1)
Sodium: 140 mmol/L (ref 135–145)

## 2015-05-19 LAB — I-STAT TROPONIN, ED: Troponin i, poc: 0.04 ng/mL (ref 0.00–0.08)

## 2015-05-19 LAB — I-STAT ARTERIAL BLOOD GAS, ED
ACID-BASE EXCESS: 6 mmol/L — AB (ref 0.0–2.0)
BICARBONATE: 30.3 meq/L — AB (ref 20.0–24.0)
O2 Saturation: 93 %
TCO2: 32 mmol/L (ref 0–100)
pCO2 arterial: 41.8 mmHg (ref 35.0–45.0)
pH, Arterial: 7.468 — ABNORMAL HIGH (ref 7.350–7.450)
pO2, Arterial: 63 mmHg — ABNORMAL LOW (ref 80.0–100.0)

## 2015-05-19 LAB — CBC
HCT: 35.1 % — ABNORMAL LOW (ref 36.0–46.0)
HEMOGLOBIN: 10.4 g/dL — AB (ref 12.0–15.0)
MCH: 25.4 pg — AB (ref 26.0–34.0)
MCHC: 29.6 g/dL — AB (ref 30.0–36.0)
MCV: 85.6 fL (ref 78.0–100.0)
Platelets: 322 10*3/uL (ref 150–400)
RBC: 4.1 MIL/uL (ref 3.87–5.11)
RDW: 18.3 % — AB (ref 11.5–15.5)
WBC: 17.6 10*3/uL — ABNORMAL HIGH (ref 4.0–10.5)

## 2015-05-19 LAB — BRAIN NATRIURETIC PEPTIDE: B Natriuretic Peptide: 347.1 pg/mL — ABNORMAL HIGH (ref 0.0–100.0)

## 2015-05-19 MED ORDER — ALBUTEROL SULFATE (2.5 MG/3ML) 0.083% IN NEBU
5.0000 mg | INHALATION_SOLUTION | Freq: Once | RESPIRATORY_TRACT | Status: AC
  Administered 2015-05-19: 5 mg via RESPIRATORY_TRACT
  Filled 2015-05-19: qty 6

## 2015-05-19 MED ORDER — ALBUTEROL (5 MG/ML) CONTINUOUS INHALATION SOLN
10.0000 mg/h | INHALATION_SOLUTION | Freq: Once | RESPIRATORY_TRACT | Status: AC
  Administered 2015-05-19: 10 mg/h via RESPIRATORY_TRACT
  Filled 2015-05-19: qty 20

## 2015-05-19 MED ORDER — VANCOMYCIN HCL IN DEXTROSE 1-5 GM/200ML-% IV SOLN
1000.0000 mg | Freq: Once | INTRAVENOUS | Status: AC
  Administered 2015-05-19: 1000 mg via INTRAVENOUS
  Filled 2015-05-19: qty 200

## 2015-05-19 MED ORDER — METHYLPREDNISOLONE SODIUM SUCC 125 MG IJ SOLR
125.0000 mg | Freq: Once | INTRAMUSCULAR | Status: AC
  Administered 2015-05-19: 125 mg via INTRAVENOUS
  Filled 2015-05-19: qty 2

## 2015-05-19 MED ORDER — PIPERACILLIN-TAZOBACTAM 3.375 G IVPB 30 MIN
3.3750 g | Freq: Once | INTRAVENOUS | Status: AC
  Administered 2015-05-19: 3.375 g via INTRAVENOUS
  Filled 2015-05-19: qty 50

## 2015-05-19 NOTE — Progress Notes (Signed)
ABG COLLECTED ON NRB

## 2015-05-19 NOTE — Progress Notes (Signed)
Pt has no O2 reserve once NRB mask is taken off to start CAT patient immediately desats into the mid to upper 80's. Pt is states that she feels fine. RT will monitor. CAT started

## 2015-05-19 NOTE — ED Notes (Signed)
Neb started  

## 2015-05-19 NOTE — ED Notes (Signed)
Pt arrives from [facility], called out for being unresponsive and cyantoic with a O2Sat in the 30s. Placed on a nonrebreather and given 1 albuterol neb, pt then became more alert but confused. Upon arrival to ED pt alert x4, c/o headache and letheragy x2 days. Pt reports CP, EMS EKG shows NSR.

## 2015-05-19 NOTE — ED Provider Notes (Signed)
CSN: [id number]     Arrival date & time 05/19/15  2006 History   First MD Initiated Contact with Patient 05/19/15 2015     Chief Complaint  Patient presents with  . Shortness of Breath     (Consider location/radiation/quality/duration/timing/severity/associated sxs/prior Treatment) HPI 69 y.o. female with a hx of COPD on 2L by 24/7 who lives in a SNF and recently had PNA about 1-2 months prior, s/p PO abx presents to the ED noting a 1 week history of worsening cough and shortness of breath, with cough becoming more productive over the last 2 days with yellow sputum. She denies associated fevers, nausea, vomiting. She denies any known sick contacts or recent travel. She states that she was compliant with her previously rx'd abx and her regular inhalers and has been using them to no avail of her sx. Today she was found at her nursing home to be significantly hypoxic despite being on her home O2 with perioral cyanosis and decreased responsiveness. EMS was called and placed her on a NRB and gave an albuterol neb causing her to perk up nicely and quickly return to her neurologic baseline and normal mid 90's oxygen saturations.   Past Medical History  Diagnosis Date  . Chronic low back pain 11/05/2005    s/p lumbar stenosis decompression surgery with residual pain  . Arthritis of left knee 01/30/2012  . Fibromyalgia 01/30/2012  . Deep venous thrombosis of lower extremity ([place]) 11/26/2010    Left, pulmonary embolism 1 month later per patient report.  On life-long anticoagulation.   . Chronic venous insufficiency 01/30/2012    Left lower extremity s/p DVT   . Morbid obesity with BMI of 40.0-44.9, adult ([place]) 01/02/2011  . Gastroesophageal reflux disease 01/30/2012  . 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 Benson Norway  . Major depression ([place]) 11/05/2005  . Anxiety 01/30/2012  . Hyperlipidemia LDL goal < 130 01/30/2012  . Urge urinary incontinence 03/25/2008    Urge predominant, minor  component or stress.  Nocturnal.   . Obstructive sleep apnea 11/05/2005    Non-compliant with nocturnal nasal CPAP secondary to it being "ineffective"   . Tobacco abuse 11/05/2005  . Irritable bowel syndrome   . Interstitial cystitis     followed by Dr. Risa Grill  . Left cataract     Surgery pending  . Bell's palsy 1980's  . History of blood transfusion     During GAVE associated GI bleed  . Complication of anesthesia     Hard time putting patient to sleep  . Family history of anesthesia complication     "Mama couldn't wake up easy"  . Asymptomatic cholelithiasis 07/15/2013  . Melanoma of nose ([place]) 09/2001    "Left side"  . Breast cancer ([place])     Bilateral s/p mastectomies  . Aortic atherosclerosis ([place]) 09/28/2014    Seen on CT scan, currently asymptomatic   Past Surgical History  Procedure Laterality Date  . Cervical discectomy  11/1995 and 05/2002    by Dr. Hal Neer  . Abdominal hysterectomy  1976  . Bladder suspension  1993  . Orif ankle fracture  1993  . Lumbar laminectomy/decompression microdiscectomy  02/2007    by Dr. Hal Neer  . Carpal tunnel release Left   . Tonsillectomy and adenoidectomy  1953  . Appendectomy    . Dilation and curettage of uterus    . Tubal ligation    . Carpectomy  07/23/2011    Procedure: CARPECTOMY;  Surgeon:  Linna Hoff, MD;  Location: [facility];  Service: Orthopedics;  Laterality: Left;  LEFT WRIST PROXIMAL ROW CARPECTOMY  . Esophagogastroduodenoscopy  12/25/2011    Procedure: ESOPHAGOGASTRODUODENOSCOPY (EGD);  Surgeon: Beryle Beams, MD;  Location: [facility] ENDOSCOPY;  Service: Endoscopy;  Laterality: N/A;  . Breast surgery  1992    Bilateral mastectomies for breast cancer  . Rhinoplasty  1993  . Spine surgery    . Hot hemostasis  02/06/2012    Procedure: HOT HEMOSTASIS (ARGON PLASMA COAGULATION/BICAP);  Surgeon: Beryle Beams, MD;  Location: [facility];  Service: Endoscopy;  Laterality: N/A;  . Esophagogastroduodenoscopy  02/06/2012    Procedure:  ESOPHAGOGASTRODUODENOSCOPY (EGD);  Surgeon: Beryle Beams, MD;  Location: [facility];  Service: Endoscopy;  Laterality: N/A;  . Esophagogastroduodenoscopy N/A 04/02/2012    Procedure: ESOPHAGOGASTRODUODENOSCOPY (EGD);  Surgeon: Beryle Beams, MD;  Location: [facility];  Service: Endoscopy;  Laterality: N/A;  EGD w/ APC  . Back surgery      x 3  . Tendon transfer Left 06/24/2012    "thumb" (06/24/2012)  . Mastectomy Bilateral   . Melanoma excision Left     "side of my nose" (06/24/2012)  . Tendon transfer Left 06/24/2012    Procedure: LEFT THUMB EIP TO EPL TENDON TRANSFER;  Surgeon: Linna Hoff, MD;  Location: [facility];  Service: Orthopedics;  Laterality: Left;   Family History  Problem Relation Age of Onset  . Heart disease Mother   . Deep vein thrombosis Mother   . Cancer - Lung Brother   . Alcohol abuse Father   . Schizophrenia Father   . Anxiety disorder Son   . Healthy Brother   . Healthy Brother    Social History  Substance Use Topics  . Smoking status: Former Smoker -- 0.50 packs/day for 40 years    Types: Cigarettes    Quit date: 03/13/2014  . Smokeless tobacco: Never Used  . Alcohol Use: No   OB History    Gravida Para Term Preterm AB TAB SAB Ectopic Multiple Living   1 1 1       1      Review of Systems  Constitutional: Positive for activity change and fatigue. Negative for fever and chills.  HENT: Negative for congestion, rhinorrhea, sinus pressure and sneezing.   Respiratory: Positive for cough and shortness of breath. Negative for chest tightness.   Cardiovascular: Negative for chest pain, palpitations and leg swelling.  Gastrointestinal: Negative for nausea, vomiting and abdominal pain.  Genitourinary: Negative for dysuria, urgency and difficulty urinating.  Musculoskeletal: Negative for myalgias and arthralgias.  Skin: Negative for rash.  Neurological: Negative for dizziness, syncope, weakness and light-headedness.  Psychiatric/Behavioral: Positive for  confusion.  All other systems reviewed and are negative.     Allergies  Adhesive; Codeine; Morphine; Sulfonamide derivatives; and Thorazine  Home Medications   Prior to Admission medications   Medication Sig Start Date End Date Taking? Authorizing Provider  apixaban (ELIQUIS) 5 MG TABS tablet Take 1 tablet (5 mg total) by mouth 2 (two) times daily. 04/06/14  Yes Ejiroghene E Emokpae, MD  budesonide (PULMICORT) 0.25 MG/2ML nebulizer solution Take 2 mLs (0.25 mg total) by nebulization every 6 (six) hours. 04/06/14  Yes Ejiroghene Arlyce Dice, MD  calcium citrate-vitamin D (CITRACAL+D) 315-200 MG-UNIT per tablet Take 2 tablets by mouth 2 (two) times daily. 11/29/12  Yes Oval Linsey, MD  Cholecalciferol (VITAMIN D3) 2000 units capsule Take 2,000 Units by mouth daily.   Yes Historical Provider, MD  dextromethorphan-guaiFENesin (MUCINEX DM) 30-600 MG 12hr tablet Take 1 tablet by mouth every 12 (twelve) hours as needed for cough.   Yes Historical Provider, MD  ferrous sulfate 325 (65 FE) MG EC tablet Take 325 mg by mouth 3 (three) times daily with meals. 03/23/12  Yes Oval Linsey, MD  gabapentin (NEURONTIN) 100 MG capsule Take 100 mg by mouth 3 (three) times daily.    Yes Historical Provider, MD  HYDROcodone-acetaminophen (NORCO) 10-325 MG per tablet Take 1-2 tablets by mouth every 6 (six) hours as needed for moderate pain (dyspnea, cough). 04/06/14  Yes Ejiroghene E Emokpae, MD  ipratropium-albuterol (DUONEB) 0.5-2.5 (3) MG/3ML SOLN Take 3 mLs by nebulization every 6 (six) hours. 04/06/14  Yes Ejiroghene Arlyce Dice, MD  lamoTRIgine (LAMICTAL) 200 MG tablet Take 200 mg by mouth every morning.   Yes Historical Provider, MD  LORazepam (ATIVAN) 1 MG tablet Take 2 mg by mouth at bedtime. Reported on 05/19/2015   Yes Historical Provider, MD  Multiple Vitamins-Minerals [facility] ADVANCED) TABS Take 1 tablet by mouth daily.   Yes Historical Provider, MD  nitrofurantoin (MACRODANTIN) 100 MG capsule Take 100  mg by mouth daily.   Yes Historical Provider, MD  pantoprazole (PROTONIX) 40 MG tablet Take 1 tablet (40 mg total) by mouth daily. Patient taking differently: Take 40 mg by mouth 2 (two) times daily.  02/16/13  Yes Oval Linsey, MD  pravastatin (PRAVACHOL) 80 MG tablet Take 1 tablet (80 mg total) by mouth daily.   Yes Oval Linsey, MD  predniSONE (DELTASONE) 5 MG tablet Take 5 mg by mouth daily with breakfast.   Yes Historical Provider, MD  QUEtiapine (SEROQUEL) 200 MG tablet Take 400 mg by mouth at bedtime.    Yes Historical Provider, MD  ranitidine (ZANTAC) 150 MG tablet Take 150 mg by mouth at bedtime.   Yes Historical Provider, MD  sertraline (ZOLOFT) 100 MG tablet Take 200 mg by mouth every morning.    Yes Historical Provider, MD  vitamin C (ASCORBIC ACID) 500 MG tablet Take 1 tablet (500 mg total) by mouth daily. 06/24/12  Yes Iran Planas, MD  albuterol (PROVENTIL) (2.5 MG/3ML) 0.083% nebulizer solution Take 3 mLs (2.5 mg total) by nebulization every 6 (six) hours as needed for shortness of breath. 11/09/13   Oval Linsey, MD  AMBULATORY NON FORMULARY MEDICATION Medication Name: oxygen 2 liters oxygen    Historical Provider, MD  fluconazole (DIFLUCAN) 100 MG tablet Take 1 tablet (100 mg total) by mouth daily. Patient not taking: Reported on 05/19/2015 12/07/14   Irene Shipper, MD  ondansetron (ZOFRAN) 4 MG tablet Take one tablet every 4-6 hours as needed for nausea Patient taking differently: Take 4 mg by mouth every 4 (four) hours as needed for nausea.  11/08/14   Irene Shipper, MD   BP 116/62 mmHg  Pulse 63  Temp(Src) 97.5 F (36.4 C) (Oral)  Resp 20  Ht 5\' 7"  (1.702 m)  Wt 137.8 kg  BMI 47.57 kg/m2  SpO2 97% Physical Exam  Constitutional: She is oriented to person, place, and time. She appears well-developed and well-nourished. No distress. Face mask in place.  Morbidly obese. NRB in place  HENT:  Head: Normocephalic and atraumatic.  Nose: Nose normal.  Mouth/Throat:  Oropharynx is clear and moist.  Eyes: Conjunctivae and EOM are normal. Pupils are equal, round, and reactive to light.  Neck: Neck supple.  Cardiovascular: Normal rate, regular rhythm, normal heart sounds and intact distal pulses.   Pulmonary/Chest: Tachypnea noted. She  has no wheezes. She has rhonchi in the right lower field and the left lower field. She has rales. She exhibits no tenderness.  Abdominal: Soft. She exhibits no distension. There is no tenderness.  Musculoskeletal: She exhibits no edema or tenderness.  Neurological: She is alert and oriented to person, place, and time. No cranial nerve deficit. Coordination normal.  Skin: Skin is warm and dry. No rash noted. She is not diaphoretic.  Nursing note and vitals reviewed.   ED Course  Procedures (including critical care time) Labs Review Labs Reviewed  MRSA PCR SCREENING - Abnormal; Notable for the following:    MRSA by PCR POSITIVE (*)    All other components within normal limits  BASIC METABOLIC PANEL - Abnormal; Notable for the following:    Glucose, Bld 125 (*)    All other components within normal limits  CBC - Abnormal; Notable for the following:    WBC 17.6 (*)    Hemoglobin 10.4 (*)    HCT 35.1 (*)    MCH 25.4 (*)    MCHC 29.6 (*)    RDW 18.3 (*)    All other components within normal limits  BRAIN NATRIURETIC PEPTIDE - Abnormal; Notable for the following:    B Natriuretic Peptide 347.1 (*)    All other components within normal limits  I-STAT ARTERIAL BLOOD GAS, ED - Abnormal; Notable for the following:    pH, Arterial 7.468 (*)    pO2, Arterial 63.0 (*)    Bicarbonate 30.3 (*)    Acid-Base Excess 6.0 (*)    All other components within normal limits  CULTURE, EXPECTORATED SPUTUM-ASSESSMENT  GRAM STAIN  PROCALCITONIN  HIV ANTIBODY (ROUTINE TESTING)  STREP PNEUMONIAE URINARY ANTIGEN  LEGIONELLA PNEUMOPHILA SEROGP 1 UR AG  I-STAT TROPOININ, ED    Imaging Review Dg Chest Portable 1 View  05/19/2015   CLINICAL DATA:  Shortness of breath and hypoxia EXAM: PORTABLE CHEST 1 VIEW COMPARISON:  04/04/2014 FINDINGS: There is chronic diffuse coarse interstitial opacity which is stable from prior. Lung volumes are normal to low. Chronic cardiomegaly. There is mediastinal and hilar adenopathy, confirmed on 2016 chest CT. IMPRESSION: 1. Interstitial lung disease with diffuse opacity that is stable compared to 04/04/2014. Chronic changes are extensive enough that superimposed pneumonia or edema would easily be obscured. 2. Chronic cardiomegaly. Electronically Signed   By: Monte Fantasia M.D.   On: 05/19/2015 20:59   I have personally reviewed and evaluated these images and lab results as part of my medical decision-making.   EKG Interpretation None      MDM  70 y.o. female with a hx of COPD on 2L of O2 by [place] and recent PNA treated with outpatient abx at her nursing home presents to the ED noting a 1 week hx of worsening shortness of breath, productive cough, worsening to the point of being significantly hypoxic on arrival by EMS. Significantly improved with NRB and 1 albuterol neb. On arrival she has NRB in place but neurologically significantly improved from her previous reported state of decreased level of consciousness. GCS 15 pleasant and conversational, though persistently tachypneic. Pulm exam with diffuse rales and basilar rhonchi. Labs were drawn and shows pH of 7.468, CO2 of 41.8, O2 63.0. Bicarb 30.3. Leukocytosis significantly elevated at 17.6. BNP 347.1. Hg 10.4. Neg Trop. CXR shows significant interstitial lung disease with extensive changes that makes it difficult to identify acute changes suggestive of PNA. Given hx of worsening SOB, productive cough and hypoxia despite being on baseline  O2 at her nursing facility feel that this is likely a PNA in the setting of severe chronic pulmonary disease. Given this will cover for HCAP. The patient had no significant wheezing noted but was seen to desaturate  quickly to the 80's if her mask is removed. She was then placed on bipap and admitted to the step down unit for further care and assessment.   Final diagnoses:  HCAP (healthcare-associated pneumonia)        Zenovia Jarred, DO 05/20/15 1330  Elnora Morrison, MD 05/20/15 2111

## 2015-05-20 ENCOUNTER — Encounter (HOSPITAL_COMMUNITY): Payer: Self-pay | Admitting: Family Medicine

## 2015-05-20 DIAGNOSIS — J9621 Acute and chronic respiratory failure with hypoxia: Secondary | ICD-10-CM | POA: Diagnosis present

## 2015-05-20 LAB — BLOOD GAS, ARTERIAL
ACID-BASE EXCESS: 4.5 mmol/L — AB (ref 0.0–2.0)
Bicarbonate: 28.7 mEq/L — ABNORMAL HIGH (ref 20.0–24.0)
DELIVERY SYSTEMS: POSITIVE
DRAWN BY: 283381
Expiratory PAP: 8
FIO2: 0.5
Inspiratory PAP: 15
MODE: POSITIVE
O2 Saturation: 92.1 %
PH ART: 7.421 (ref 7.350–7.450)
Patient temperature: 98.6
RATE: 14 resp/min
TCO2: 30.1 mmol/L (ref 0–100)
pCO2 arterial: 45.1 mmHg — ABNORMAL HIGH (ref 35.0–45.0)
pO2, Arterial: 66.3 mmHg — ABNORMAL LOW (ref 80.0–100.0)

## 2015-05-20 LAB — MRSA PCR SCREENING: MRSA BY PCR: POSITIVE — AB

## 2015-05-20 LAB — HEPARIN LEVEL (UNFRACTIONATED)
HEPARIN UNFRACTIONATED: 0.9 [IU]/mL — AB (ref 0.30–0.70)
Heparin Unfractionated: 0.95 IU/mL — ABNORMAL HIGH (ref 0.30–0.70)

## 2015-05-20 LAB — STREP PNEUMONIAE URINARY ANTIGEN: Strep Pneumo Urinary Antigen: NEGATIVE

## 2015-05-20 LAB — PROCALCITONIN: PROCALCITONIN: 0.22 ng/mL

## 2015-05-20 LAB — APTT
APTT: 31 s (ref 24–37)
aPTT: 56 seconds — ABNORMAL HIGH (ref 24–37)

## 2015-05-20 LAB — HIV ANTIBODY (ROUTINE TESTING W REFLEX): HIV Screen 4th Generation wRfx: NONREACTIVE

## 2015-05-20 MED ORDER — DM-GUAIFENESIN ER 30-600 MG PO TB12: 1.0000 | ORAL_TABLET | Freq: Two times a day (BID) | ORAL | Status: DC | PRN

## 2015-05-20 MED ORDER — LORAZEPAM 1 MG PO TABS
2.0000 mg | ORAL_TABLET | Freq: Every day | ORAL | Status: DC
  Administered 2015-05-20 (×2): 2 mg via ORAL
  Filled 2015-05-20 (×2): qty 2

## 2015-05-20 MED ORDER — DEXTROSE 5 % IV SOLN
2.0000 g | Freq: Three times a day (TID) | INTRAVENOUS | Status: DC
  Administered 2015-05-20 – 2015-05-23 (×11): 2 g via INTRAVENOUS
  Filled 2015-05-20 (×13): qty 2

## 2015-05-20 MED ORDER — VITAMIN C 500 MG PO TABS
500.0000 mg | ORAL_TABLET | Freq: Every day | ORAL | Status: DC
  Filled 2015-05-20: qty 1

## 2015-05-20 MED ORDER — IPRATROPIUM-ALBUTEROL 0.5-2.5 (3) MG/3ML IN SOLN: 3.0000 mL | RESPIRATORY_TRACT | Status: DC | PRN

## 2015-05-20 MED ORDER — SODIUM CHLORIDE 0.9% FLUSH: 3.0000 mL | INTRAVENOUS | Status: DC | PRN

## 2015-05-20 MED ORDER — HYDROCODONE-ACETAMINOPHEN 10-325 MG PO TABS: 1.0000 | ORAL_TABLET | Freq: Four times a day (QID) | ORAL | Status: DC | PRN

## 2015-05-20 MED ORDER — CALCIUM CARBONATE-VITAMIN D 500-200 MG-UNIT PO TABS
2.0000 | ORAL_TABLET | Freq: Two times a day (BID) | ORAL | Status: DC
  Filled 2015-05-20: qty 2

## 2015-05-20 MED ORDER — SODIUM CHLORIDE 0.9% FLUSH
3.0000 mL | Freq: Two times a day (BID) | INTRAVENOUS | Status: DC
  Administered 2015-05-20 – 2015-05-23 (×6): 3 mL via INTRAVENOUS

## 2015-05-20 MED ORDER — PANTOPRAZOLE SODIUM 40 MG PO TBEC
40.0000 mg | DELAYED_RELEASE_TABLET | Freq: Two times a day (BID) | ORAL | Status: DC
  Administered 2015-05-20: 40 mg via ORAL
  Filled 2015-05-20 (×2): qty 1

## 2015-05-20 MED ORDER — FERROUS SULFATE 325 (65 FE) MG PO TABS
325.0000 mg | ORAL_TABLET | Freq: Three times a day (TID) | ORAL | Status: DC
  Filled 2015-05-20: qty 1

## 2015-05-20 MED ORDER — METHYLPREDNISOLONE SODIUM SUCC 125 MG IJ SOLR
60.0000 mg | Freq: Four times a day (QID) | INTRAMUSCULAR | Status: DC
  Administered 2015-05-20 – 2015-05-21 (×6): 60 mg via INTRAVENOUS
  Filled 2015-05-20 (×6): qty 2

## 2015-05-20 MED ORDER — CETYLPYRIDINIUM CHLORIDE 0.05 % MT LIQD
7.0000 mL | Freq: Two times a day (BID) | OROMUCOSAL | Status: DC
  Administered 2015-05-20 – 2015-05-26 (×10): 7 mL via OROMUCOSAL

## 2015-05-20 MED ORDER — CHLORHEXIDINE GLUCONATE 0.12 % MT SOLN
15.0000 mL | Freq: Two times a day (BID) | OROMUCOSAL | Status: DC
  Administered 2015-05-20 – 2015-05-26 (×13): 15 mL via OROMUCOSAL
  Filled 2015-05-20 (×13): qty 15

## 2015-05-20 MED ORDER — PNEUMOCOCCAL VAC POLYVALENT 25 MCG/0.5ML IJ INJ
0.5000 mL | INJECTION | INTRAMUSCULAR | Status: DC
  Filled 2015-05-20: qty 0.5

## 2015-05-20 MED ORDER — HEPARIN BOLUS VIA INFUSION
2500.0000 [IU] | Freq: Once | INTRAVENOUS | Status: AC
  Administered 2015-05-20: 2500 [IU] via INTRAVENOUS
  Filled 2015-05-20: qty 2500

## 2015-05-20 MED ORDER — PRAVASTATIN SODIUM 40 MG PO TABS
80.0000 mg | ORAL_TABLET | Freq: Every day | ORAL | Status: DC
  Filled 2015-05-20: qty 2

## 2015-05-20 MED ORDER — APIXABAN 5 MG PO TABS
5.0000 mg | ORAL_TABLET | Freq: Two times a day (BID) | ORAL | Status: DC
  Filled 2015-05-20: qty 1

## 2015-05-20 MED ORDER — SODIUM CHLORIDE 0.9 % IV SOLN
250.0000 mL | INTRAVENOUS | Status: DC | PRN
Start: 2015-05-20 — End: 2015-05-23

## 2015-05-20 MED ORDER — LAMOTRIGINE 100 MG PO TABS
200.0000 mg | ORAL_TABLET | Freq: Every morning | ORAL | Status: DC
  Filled 2015-05-20: qty 2

## 2015-05-20 MED ORDER — BUDESONIDE 0.25 MG/2ML IN SUSP
0.2500 mg | Freq: Four times a day (QID) | RESPIRATORY_TRACT | Status: DC
  Administered 2015-05-20 – 2015-05-21 (×5): 0.25 mg via RESPIRATORY_TRACT
  Filled 2015-05-20 (×5): qty 2

## 2015-05-20 MED ORDER — HEPARIN (PORCINE) IN NACL 100-0.45 UNIT/ML-% IJ SOLN
1700.0000 [IU]/h | INTRAMUSCULAR | Status: DC
  Administered 2015-05-20: 1500 [IU]/h via INTRAVENOUS
  Filled 2015-05-20: qty 500

## 2015-05-20 MED ORDER — QUETIAPINE FUMARATE 50 MG PO TABS
400.0000 mg | ORAL_TABLET | Freq: Every day | ORAL | Status: DC
  Administered 2015-05-20: 400 mg via ORAL
  Filled 2015-05-20: qty 8

## 2015-05-20 MED ORDER — CHLORHEXIDINE GLUCONATE CLOTH 2 % EX PADS
6.0000 | MEDICATED_PAD | Freq: Every day | CUTANEOUS | Status: AC
  Administered 2015-05-20 – 2015-05-24 (×5): 6 via TOPICAL

## 2015-05-20 MED ORDER — GABAPENTIN 100 MG PO CAPS
100.0000 mg | ORAL_CAPSULE | Freq: Three times a day (TID) | ORAL | Status: DC
  Filled 2015-05-20: qty 1

## 2015-05-20 MED ORDER — ADULT MULTIVITAMIN W/MINERALS CH
1.0000 | ORAL_TABLET | Freq: Every day | ORAL | Status: DC
  Administered 2015-05-21 – 2015-05-23 (×3): 1 via ORAL
  Filled 2015-05-20 (×4): qty 1

## 2015-05-20 MED ORDER — VITAMIN D 1000 UNITS PO TABS
2000.0000 [IU] | ORAL_TABLET | Freq: Every day | ORAL | Status: DC
  Filled 2015-05-20: qty 2

## 2015-05-20 MED ORDER — SODIUM CHLORIDE 0.9 % IV SOLN
1250.0000 mg | Freq: Two times a day (BID) | INTRAVENOUS | Status: DC
  Administered 2015-05-20 – 2015-05-22 (×6): 1250 mg via INTRAVENOUS
  Filled 2015-05-20 (×10): qty 1250

## 2015-05-20 MED ORDER — FAMOTIDINE 20 MG PO TABS
20.0000 mg | ORAL_TABLET | Freq: Every day | ORAL | Status: DC
  Filled 2015-05-20: qty 1

## 2015-05-20 MED ORDER — CALCIUM CITRATE-VITAMIN D 315-200 MG-UNIT PO TABS: 2.0000 | ORAL_TABLET | Freq: Two times a day (BID) | ORAL | Status: DC

## 2015-05-20 MED ORDER — SERTRALINE HCL 100 MG PO TABS
200.0000 mg | ORAL_TABLET | Freq: Every morning | ORAL | Status: DC
  Filled 2015-05-20: qty 2

## 2015-05-20 MED ORDER — MUPIROCIN 2 % EX OINT
1.0000 "application " | TOPICAL_OINTMENT | Freq: Two times a day (BID) | CUTANEOUS | Status: AC
  Administered 2015-05-21 – 2015-05-24 (×8): 1 via NASAL
  Filled 2015-05-20 (×2): qty 22

## 2015-05-20 NOTE — Progress Notes (Addendum)
[facility] Brief Accept Note  I have reviewed the H&P by Dr. Myna Hidalgo.  [name] presents with acute on chronic respiratory failure from her SNF.    She has had increasing dyspnea over 2 days prior with increased O2 needs.  CXR showed diffuse chronic opacities which appear stable, but pneumonia and edema could not be excluded.  She had a leukocytosis and was started on treatment for HCAP along with steroids and nebulizers.    When I saw her, she reported feeling better.  She was on the BIPAP and sleeping comfortably with O2 saturation of 96%.  She stated she was in no pain.  Rate was regular, rate was in the 70s.  Her lungs sounded relatively clear with some course rhonchi throughout, no wheezing.  She has an obese abdomen with + BS.  No LE edema noted.  Updated Plan:  Reassess in the afternoon, trial off of bipap Follow up cultures of sputum and urinary antigens Monitor closely in SDU for today Further information based on reassessment.   UPDATE:  Could not be weaned off of BIPAP, sats dropped to 85% when attempted to wean ABG today Hold oral medications for today, restart once off BIPAP.   Jennifer Chiquito, MD

## 2015-05-20 NOTE — Progress Notes (Signed)
ANTICOAGULATION CONSULT NOTE - Initial Consult  Pharmacy Consult for Heparin Indication: Hx PE and DVT (Eliquis on hold d/t NPO status)  Allergies  Allergen Reactions  . Adhesive [Tape] Other (See Comments)    "I break out all over"; Can only use paper tape  . Codeine Other (See Comments)    "first dose I took, my aide found me the next day laying on the floor"  . Morphine Other (See Comments)    caused change in mental status, she does not want to try it again.  . Sulfonamide Derivatives Nausea And Vomiting  . Thorazine [Chlorpromazine] Nausea Only    Patient Measurements: Height: 5\' 7"  (170.2 cm) Weight: (!) 303 lb 12.7 oz (137.8 kg) IBW/kg (Calculated) : 61.6 Heparin Dosing Weight: 95 kg  Vital Signs: Temp: 97.5 F (36.4 C) (04/30 1153) Temp Source: Oral (04/30 1153) BP: 116/62 mmHg (04/30 1153) Pulse Rate: 71 (04/30 1442)  Labs:  Recent Labs  05/19/15 2033  HGB 10.4*  HCT 35.1*  PLT 322  CREATININE 0.74    Estimated Creatinine Clearance: 95.1 mL/min (by C-G formula based on Cr of 0.74).   Medical History: Past Medical History  Diagnosis Date  . Chronic low back pain 11/05/2005    s/p lumbar stenosis decompression surgery with residual pain  . Arthritis of left knee 01/30/2012  . Fibromyalgia 01/30/2012  . 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  On life-long anticoagulation.   . Chronic venous insufficiency 01/30/2012    Left lower extremity s/p DVT   . Morbid obesity with BMI of 40.0-44.9, adult ([place]) 01/02/2011  . Gastroesophageal reflux disease 01/30/2012  . 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 Benson Norway  . Major depression ([place]) 11/05/2005  . Anxiety 01/30/2012  . Hyperlipidemia LDL goal < 130 01/30/2012  . Urge urinary incontinence 03/25/2008    Urge predominant, minor component or stress.  Nocturnal.   . Obstructive sleep apnea 11/05/2005   Non-compliant with nocturnal nasal CPAP secondary to it being "ineffective"   . Tobacco abuse 11/05/2005  . Irritable bowel syndrome   . Interstitial cystitis     followed by Dr. Risa Grill  . Left cataract     Surgery pending  . Bell's palsy 1980's  . History of blood transfusion     During GAVE associated GI bleed  . Complication of anesthesia     Hard time putting patient to sleep  . Family history of anesthesia complication     "Mama couldn't wake up easy"  . Asymptomatic cholelithiasis 07/15/2013  . Melanoma of nose ([place]) 09/2001    "Left side"  . Breast cancer ([place])     Bilateral s/p mastectomies  . Aortic atherosclerosis ([place]) 09/28/2014    Seen on CT scan, currently asymptomatic    Assessment: 70 year old female on Eliquis PTA for hx of DVT and PE now admitted with respiratory failure requiring BiPAP and NPO so switching to IV heparin per pharmacy dosing.   Last dose of Eliquis was 05/19/15 at 0800.   Goal of Therapy:  Heparin level 0.3-0.7 units/ml aPTT 66-102 seconds Monitor platelets by anticoagulation protocol: Yes   Plan:  -Baseline aPTT and HL per protocol as <72hrs since last dose -Start heparin with 2500 unit bolus then drip at 1500 units/hr.  -aPTT and HL in 8 hours - using aPTT for now due to recent Eliquis dose and effect on HL -  Monitor for signs and symptoms of bleeding  Sloan Leiter, PharmD, BCPS Clinical Pharmacist [phone] 05/20/2015,2:54 PM

## 2015-05-20 NOTE — Progress Notes (Signed)
PT Cancellation Note  Patient Details Name: DANEE WOERNER MRN: [id number] DOB: [birth year]   Cancelled Treatment:    Reason Eval/Treat Not Completed: Patient not medically ready.  Patient on BiPAP.  Attempts to wean later today.  Will return at later date for PT evaluation   Despina Pole 05/20/2015, 4:47 PM Carita Pian. Sanjuana Kava, [place] Pager [phone]

## 2015-05-20 NOTE — Progress Notes (Signed)
Utilization review completedUtilization review completed 

## 2015-05-20 NOTE — Progress Notes (Signed)
Pt transferred to [id number] no complications noted, uneventful trip. Report given to unit RT. RN at bedside.

## 2015-05-20 NOTE — H&P (Signed)
History and Physical    [name] [id number] DOB: 1945/07/10 DOA: 05/19/2015  Referring Provider: EDP  PCP: Sheela Stack, MD  Outpatient Specialists: Dr. Henrene Pastor (GI), Dr. Chase Caller (pulmonology), Dr. Hulan Fray (OBGYN)   Patient coming from: SNF  Chief Complaint: Respiratory problems  HPI: [name] is a 70 y.o. female with medical history significant for chronic respiratory failure followed by pulmonology with unclear etiology, recurrent VTE, depression, and anxiety who presents from her SNF where she was found unresponsive, cyanotic, and with O2 saturations in the 40s. Patient had been increasingly lethargic and dyspneic for the past 2 days. She had a pneumonia which was treated in the outpatient setting approximately 2 months ago, but had seemed to make good recovery since that time. Over the last 2 days, however, she has been increasingly dyspneic at rest despite her chronic use of supplemental oxygen. Staff at the SNF has noted her to be increasingly lethargic. This afternoon, she was found unresponsive, cyanotic, and the O2 sat was in the 30s. EMS was activated for transport to the hospital. She was placed on nonrebreather en route to the hospital and given a nebulized albuterol treatment. She had some improvement en route.  ED Course: Upon arrival to the ED, patient is found to be afebrile, saturating 60% on room air initially, breathing 28 times per minute, and with blood pressure in the 100/50 range. EKG feature to sinus rhythm with low-voltage QRS. Troponin is 0.04 and BNP is 347. Chest x-ray demonstrates diffuse chronic opacities which appears stable compared to a film from 1 year ago but underlying pneumonia or edema cannot be excluded. BMP is unremarkable and CBC features a leukocytosis to 17,600 and hemoglobin of 10.4 with normal MCV. She was given a continuous neb treatment in the ED, 125 mg IV Solu-Medrol, and empiric vancomycin and Zosyn. She'll be admitted to the  stepdown unit for ongoing evaluation and management of acute on chronic respiratory failure suspected secondary to healthcare associated pneumonia.  Review of Systems:  All other systems reviewed and apart from HPI, are negative.  Past Medical History  Diagnosis Date  . Chronic low back pain 11/05/2005    s/p lumbar stenosis decompression surgery with residual pain  . Arthritis of left knee 01/30/2012  . Fibromyalgia 01/30/2012  . 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  On life-long anticoagulation.   . Chronic venous insufficiency 01/30/2012    Left lower extremity s/p DVT   . Morbid obesity with BMI of 40.0-44.9, adult ([place]) 01/02/2011  . Gastroesophageal reflux disease 01/30/2012  . 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 Benson Norway  . Major depression ([place]) 11/05/2005  . Anxiety 01/30/2012  . Hyperlipidemia LDL goal < 130 01/30/2012  . Urge urinary incontinence 03/25/2008    Urge predominant, minor component or stress.  Nocturnal.   . Obstructive sleep apnea 11/05/2005    Non-compliant with nocturnal nasal CPAP secondary to it being "ineffective"   . Tobacco abuse 11/05/2005  . Irritable bowel syndrome   . Interstitial cystitis     followed by Dr. Risa Grill  . Left cataract     Surgery pending  . Bell's palsy 1980's  . History of blood transfusion     During GAVE associated GI bleed  . Complication of anesthesia     Hard time putting patient to sleep  . Family history of anesthesia complication     "  Mama couldn't wake up easy"  . Asymptomatic cholelithiasis 07/15/2013  . Melanoma of nose ([place]) 09/2001    "Left side"  . Breast cancer ([place])     Bilateral s/p mastectomies  . Aortic atherosclerosis ([place]) 09/28/2014    Seen on CT scan, currently asymptomatic    Past Surgical History  Procedure Laterality Date  . Cervical discectomy  11/1995 and 05/2002    by Dr. Hal Neer  . Abdominal  hysterectomy  1976  . Bladder suspension  1993  . Orif ankle fracture  1993  . Lumbar laminectomy/decompression microdiscectomy  02/2007    by Dr. Hal Neer  . Carpal tunnel release Left   . Tonsillectomy and adenoidectomy  1953  . Appendectomy    . Dilation and curettage of uterus    . Tubal ligation    . Carpectomy  07/23/2011    Procedure: CARPECTOMY;  Surgeon: Linna Hoff, MD;  Location: [facility];  Service: Orthopedics;  Laterality: Left;  LEFT WRIST PROXIMAL ROW CARPECTOMY  . Esophagogastroduodenoscopy  12/25/2011    Procedure: ESOPHAGOGASTRODUODENOSCOPY (EGD);  Surgeon: Beryle Beams, MD;  Location: [facility] ENDOSCOPY;  Service: Endoscopy;  Laterality: N/A;  . Breast surgery  1992    Bilateral mastectomies for breast cancer  . Rhinoplasty  1993  . Spine surgery    . Hot hemostasis  02/06/2012    Procedure: HOT HEMOSTASIS (ARGON PLASMA COAGULATION/BICAP);  Surgeon: Beryle Beams, MD;  Location: [facility];  Service: Endoscopy;  Laterality: N/A;  . Esophagogastroduodenoscopy  02/06/2012    Procedure: ESOPHAGOGASTRODUODENOSCOPY (EGD);  Surgeon: Beryle Beams, MD;  Location: [facility];  Service: Endoscopy;  Laterality: N/A;  . Esophagogastroduodenoscopy N/A 04/02/2012    Procedure: ESOPHAGOGASTRODUODENOSCOPY (EGD);  Surgeon: Beryle Beams, MD;  Location: [facility];  Service: Endoscopy;  Laterality: N/A;  EGD w/ APC  . Back surgery      x 3  . Tendon transfer Left 06/24/2012    "thumb" (06/24/2012)  . Mastectomy Bilateral   . Melanoma excision Left     "side of my nose" (06/24/2012)  . Tendon transfer Left 06/24/2012    Procedure: LEFT THUMB EIP TO EPL TENDON TRANSFER;  Surgeon: Linna Hoff, MD;  Location: [facility];  Service: Orthopedics;  Laterality: Left;     reports that she quit smoking about 14 months ago. Her smoking use included Cigarettes. She has a 20 pack-year smoking history. She has never used smokeless tobacco. She reports that she does not drink alcohol or use illicit  drugs.  Allergies  Allergen Reactions  . Adhesive [Tape] Other (See Comments)    "I break out all over"; Can only use paper tape  . Codeine Other (See Comments)    "first dose I took, my aide found me the next day laying on the floor"  . Morphine Other (See Comments)    caused change in mental status, she does not want to try it again.  . Sulfonamide Derivatives Nausea And Vomiting  . Thorazine [Chlorpromazine] Nausea Only    Family History  Problem Relation Age of Onset  . Heart disease Mother   . Deep vein thrombosis Mother   . Cancer - Lung Brother   . Alcohol abuse Father   . Schizophrenia Father   . Anxiety disorder Son   . Healthy Brother   . Healthy Brother      Prior to Admission medications   Medication Sig Start Date End Date Taking? Authorizing Provider  apixaban (ELIQUIS) 5 MG TABS tablet Take  1 tablet (5 mg total) by mouth 2 (two) times daily. 04/06/14  Yes Ejiroghene E Emokpae, MD  budesonide (PULMICORT) 0.25 MG/2ML nebulizer solution Take 2 mLs (0.25 mg total) by nebulization every 6 (six) hours. 04/06/14  Yes Ejiroghene Arlyce Dice, MD  calcium citrate-vitamin D (CITRACAL+D) 315-200 MG-UNIT per tablet Take 2 tablets by mouth 2 (two) times daily. 11/29/12  Yes Oval Linsey, MD  Cholecalciferol (VITAMIN D3) 2000 units capsule Take 2,000 Units by mouth daily.   Yes Historical Provider, MD  dextromethorphan-guaiFENesin (MUCINEX DM) 30-600 MG 12hr tablet Take 1 tablet by mouth every 12 (twelve) hours as needed for cough.   Yes Historical Provider, MD  ferrous sulfate 325 (65 FE) MG EC tablet Take 325 mg by mouth 3 (three) times daily with meals. 03/23/12  Yes Oval Linsey, MD  gabapentin (NEURONTIN) 100 MG capsule Take 100 mg by mouth 3 (three) times daily.    Yes Historical Provider, MD  HYDROcodone-acetaminophen (NORCO) 10-325 MG per tablet Take 1-2 tablets by mouth every 6 (six) hours as needed for moderate pain (dyspnea, cough). 04/06/14  Yes Ejiroghene E Emokpae, MD   ipratropium-albuterol (DUONEB) 0.5-2.5 (3) MG/3ML SOLN Take 3 mLs by nebulization every 6 (six) hours. 04/06/14  Yes Ejiroghene Arlyce Dice, MD  lamoTRIgine (LAMICTAL) 200 MG tablet Take 200 mg by mouth every morning.   Yes Historical Provider, MD  LORazepam (ATIVAN) 1 MG tablet Take 2 mg by mouth at bedtime. Reported on 05/19/2015   Yes Historical Provider, MD  Multiple Vitamins-Minerals [facility] ADVANCED) TABS Take 1 tablet by mouth daily.   Yes Historical Provider, MD  nitrofurantoin (MACRODANTIN) 100 MG capsule Take 100 mg by mouth daily.   Yes Historical Provider, MD  pantoprazole (PROTONIX) 40 MG tablet Take 1 tablet (40 mg total) by mouth daily. Patient taking differently: Take 40 mg by mouth 2 (two) times daily.  02/16/13  Yes Oval Linsey, MD  pravastatin (PRAVACHOL) 80 MG tablet Take 1 tablet (80 mg total) by mouth daily.   Yes Oval Linsey, MD  predniSONE (DELTASONE) 5 MG tablet Take 5 mg by mouth daily with breakfast.   Yes Historical Provider, MD  QUEtiapine (SEROQUEL) 200 MG tablet Take 400 mg by mouth at bedtime.    Yes Historical Provider, MD  ranitidine (ZANTAC) 150 MG tablet Take 150 mg by mouth at bedtime.   Yes Historical Provider, MD  sertraline (ZOLOFT) 100 MG tablet Take 200 mg by mouth every morning.    Yes Historical Provider, MD  vitamin C (ASCORBIC ACID) 500 MG tablet Take 1 tablet (500 mg total) by mouth daily. 06/24/12  Yes Iran Planas, MD  albuterol (PROVENTIL) (2.5 MG/3ML) 0.083% nebulizer solution Take 3 mLs (2.5 mg total) by nebulization every 6 (six) hours as needed for shortness of breath. 11/09/13   Oval Linsey, MD  AMBULATORY NON FORMULARY MEDICATION Medication Name: oxygen 2 liters oxygen    Historical Provider, MD  fluconazole (DIFLUCAN) 100 MG tablet Take 1 tablet (100 mg total) by mouth daily. Patient not taking: Reported on 05/19/2015 12/07/14   Irene Shipper, MD  ondansetron (ZOFRAN) 4 MG tablet Take one tablet every 4-6 hours as needed for  nausea Patient taking differently: Take 4 mg by mouth every 4 (four) hours as needed for nausea.  11/08/14   Irene Shipper, MD    Physical Exam: Filed Vitals:   05/19/15 2245 05/19/15 2259 05/20/15 0008 05/20/15 0011  BP: 107/60 107/60 114/85 114/85  Pulse: 80 79 75 75  Temp:  TempSrc:      Resp: 20 25 15 16   SpO2: 87% 92% 96% 96%      Constitutional: On BiPAP, using accessory muscles, speaking full sentences, no pallor Eyes: PERTLA, lids and conjunctivae normal ENMT: Mucous membranes are moist. Posterior pharynx clear of any exudate or lesions.   Neck: normal, supple, no masses, no thyromegaly Respiratory:  Breath sounds markedly diminished bilaterally, faint rhonchi over upper right lung fields  Cardiovascular: S1 & S2 heard, regular rate and rhythm. No carotid bruits.  Abdomen: No distension, no tenderness, no masses palpated. Bowel sounds normal.  Musculoskeletal: no clubbing / cyanosis. No joint deformity upper and lower extremities.    Skin: no rashes, lesions, ulcers. No induration Neurologic: CN 2-12 grossly intact. Sensation intact, DTR normal. Strength 5/5 in all 4 limbs.  Psychiatric: Normal judgment and insight. Alert and oriented x 3. Normal mood.     Labs on Admission: I have personally reviewed following labs and imaging studies  CBC:  Recent Labs Lab 05/19/15 2033  WBC 17.6*  HGB 10.4*  HCT 35.1*  MCV 85.6  PLT [id number]   Basic Metabolic Panel:  Recent Labs Lab 05/19/15 2033  NA 140  K 3.8  CL 102  CO2 28  GLUCOSE 125*  BUN 9  CREATININE 0.74  CALCIUM 9.0   GFR: CrCl cannot be calculated (Unknown ideal weight.). Liver Function Tests: No results for input(s): AST, ALT, ALKPHOS, BILITOT, PROT, ALBUMIN in the last 168 hours. No results for input(s): LIPASE, AMYLASE in the last 168 hours. No results for input(s): AMMONIA in the last 168 hours. Coagulation Profile: No results for input(s): INR, PROTIME in the last 168 hours. Cardiac  Enzymes: No results for input(s): CKTOTAL, CKMB, CKMBINDEX, TROPONINI in the last 168 hours. BNP (last 3 results) No results for input(s): PROBNP in the last 8760 hours. HbA1C: No results for input(s): HGBA1C in the last 72 hours. CBG: No results for input(s): GLUCAP in the last 168 hours. Lipid Profile: No results for input(s): CHOL, HDL, LDLCALC, TRIG, CHOLHDL, LDLDIRECT in the last 72 hours. Thyroid Function Tests: No results for input(s): TSH, T4TOTAL, FREET4, T3FREE, THYROIDAB in the last 72 hours. Anemia Panel: No results for input(s): VITAMINB12, FOLATE, FERRITIN, TIBC, IRON, RETICCTPCT in the last 72 hours. Urine analysis:    Component Value Date/Time   COLORURINE YELLOW 09/09/2012 1811   APPEARANCEUR CLOUDY* 09/09/2012 1811   LABSPEC 1.026 09/09/2012 1811   PHURINE 6.0 09/09/2012 1811   GLUCOSEU NEGATIVE 09/09/2012 1811   GLUCOSEU NEG mg/dL 09/04/2009 2008   HGBUR TRACE* 09/09/2012 1811   HGBUR trace-intact 01/25/2010 1354   BILIRUBINUR NEGATIVE 09/09/2012 1811   KETONESUR NEGATIVE 09/09/2012 1811   PROTEINUR NEGATIVE 09/09/2012 1811   UROBILINOGEN 0.2 09/09/2012 1811   NITRITE POSITIVE* 09/09/2012 1811   LEUKOCYTESUR MODERATE* 09/09/2012 1811   Sepsis Labs: @LABRCNTIP (procalcitonin:4,lacticidven:4) )No results found for this or any previous visit (from the past 240 hour(s)).   Radiological Exams on Admission: Dg Chest Portable 1 View  05/19/2015  CLINICAL DATA:  Shortness of breath and hypoxia EXAM: PORTABLE CHEST 1 VIEW COMPARISON:  04/04/2014 FINDINGS: There is chronic diffuse coarse interstitial opacity which is stable from prior. Lung volumes are normal to low. Chronic cardiomegaly. There is mediastinal and hilar adenopathy, confirmed on 2016 chest CT. IMPRESSION: 1. Interstitial lung disease with diffuse opacity that is stable compared to 04/04/2014. Chronic changes are extensive enough that superimposed pneumonia or edema would easily be obscured. 2. Chronic  cardiomegaly. Electronically Signed  By: Monte Fantasia M.D.   On: 05/19/2015 20:59    EKG: Independently reviewed. Sinus rhythm, low-voltage QRS  Assessment/Plan  1. Acute on chronic hypoxic respiratory failure  - Using supplemental O2 atc, unable to tell me flow rate - Followed by pulm outpt, but etiology of her chronic lung disease seems to be uncertain  - Managed with Pulmicort and DuoNebs at home, will continue  - BiPAP prn  - Solu-Medrol 125 mg IV given in ED, continuing with 60 mg IV q6h  - With leukocytosis and purulent sputum, suspect an underlying PNA - Continuous pulse oximetry with titration of FiO2 to maintain sats low 90s   2. HCAP  - Presents with AoC resp failure, leukocytosis, and purulent sputum  - Given the degree of her chronic lung disease, difficult to assess for underlying infiltrate on CXR  - Treating empirically with vancomycin and cefepime given her residence in SNF and treatment for PNA within last cpl mos  - Sputum GS and culture requested  - Check urine for antigens to strep pneumo, legionella  - Trend procalcitonin  - Manage associated AoC resp failure as above   3. Recurrent VTE  - Maintained on AC with Eliquis, will continue   4. Depression, anxiety  - Exacerbated by current illness  - Family is at the bedside for support  - Denies SI, hallucinations  - Continue current-dose Seroquel, Zoloft   5. Anemia - Hgb 10.4 on admission  - Stable relative to priors  - Attributed to iron-deficiency; continue iron supplementation  - No sign of active blood loss    DVT prophylaxis: On Eliquis  Code Status: Partial   Family Communication: Children at bedside  Disposition Plan: Admit to stepdown  Consults called: None  Admission status: Inpatient    Vianne Bulls MD [facility] Pager [phone]  If 7PM-7AM, please contact night-coverage [email or web address] Password [email or web address]  05/20/2015, 1:01 AM

## 2015-05-20 NOTE — ED Notes (Signed)
Pt is on NIV at this time tolerating it well, no complications noted. RT will continue to monitor patient.

## 2015-05-20 NOTE — Progress Notes (Signed)
ANTICOAGULATION CONSULT NOTE - Follow Up Consult  Pharmacy Consult for Heparin (while Apixaban on hold) Indication: Hx PE/DVT  Allergies  Allergen Reactions  . Adhesive [Tape] Other (See Comments)    "I break out all over"; Can only use paper tape  . Codeine Other (See Comments)    "first dose I took, my aide found me the next day laying on the floor"  . Morphine Other (See Comments)    caused change in mental status, she does not want to try it again.  . Sulfonamide Derivatives Nausea And Vomiting  . Thorazine [Chlorpromazine] Nausea Only    Patient Measurements: Height: 5\' 7"  (170.2 cm) Weight: (!) 303 lb 12.7 oz (137.8 kg) IBW/kg (Calculated) : 61.6  Vital Signs: Temp: 98.5 F (36.9 C) (04/30 2302) Temp Source: Axillary (04/30 2302) BP: 123/53 mmHg (04/30 2031) Pulse Rate: 72 (04/30 2031)  Labs:  Recent Labs  05/19/15 2033 05/20/15 1520 05/20/15 2238  HGB 10.4*  --   --   HCT 35.1*  --   --   PLT 322  --   --   APTT  --  31 56*  HEPARINUNFRC  --  0.90* 0.95*  CREATININE 0.74  --   --     Estimated Creatinine Clearance: 95.1 mL/min (by C-G formula based on Cr of 0.74).  Assessment: Heparin while apixaban on hold, initial aPTT is low at 56, using aPTT to dose for now given Apixaban influence heparin levels, no issues per RN.   Goal of Therapy:  Heparin level 0.3-0.7 units/ml aPTT 66-102 seconds Monitor platelets by anticoagulation protocol: Yes   Plan:  -Increase heparin 1700 units/hr -0800 aPTT/HL  Narda Bonds 05/20/2015,11:30 PM

## 2015-05-20 NOTE — Progress Notes (Signed)
Pharmacy Antibiotic Note  [name] is a 70 y.o. female admitted on 05/19/2015 with pneumonia.  Pharmacy has been consulted for Vancomycin dosing. WBC elevated, renal function age appropriate, pt from nursing facility.  Plan: -Vancomycin 1250 mg IV q12h -Cefepime per MD -Trend WBC, temp, renal function  -Drug levels as indicated   Temp (24hrs), Avg:98.5 F (36.9 C), Min:98.5 F (36.9 C), Max:98.5 F (36.9 C)   Recent Labs Lab 05/19/15 2033  WBC 17.6*  CREATININE 0.74    CrCl cannot be calculated (Unknown ideal weight.).    Allergies  Allergen Reactions  . Adhesive [Tape] Other (See Comments)    "I break out all over"; Can only use paper tape  . Codeine Other (See Comments)    "first dose I took, my aide found me the next day laying on the floor"  . Morphine Other (See Comments)    caused change in mental status, she does not want to try it again.  . Sulfonamide Derivatives Nausea And Vomiting  . Thorazine [Chlorpromazine] Nausea Only    Narda Bonds 05/20/2015 1:08 AM

## 2015-05-21 ENCOUNTER — Inpatient Hospital Stay (HOSPITAL_COMMUNITY): Payer: Medicare Other

## 2015-05-21 DIAGNOSIS — J8410 Pulmonary fibrosis, unspecified: Principal | ICD-10-CM

## 2015-05-21 DIAGNOSIS — Z515 Encounter for palliative care: Secondary | ICD-10-CM | POA: Insufficient documentation

## 2015-05-21 DIAGNOSIS — J9621 Acute and chronic respiratory failure with hypoxia: Secondary | ICD-10-CM

## 2015-05-21 LAB — CBC WITH DIFFERENTIAL/PLATELET
Basophils Absolute: 0 K/uL (ref 0.0–0.1)
Basophils Relative: 0 %
Eosinophils Absolute: 0 K/uL (ref 0.0–0.7)
Eosinophils Relative: 0 %
HCT: 34 % — ABNORMAL LOW (ref 36.0–46.0)
Hemoglobin: 10.1 g/dL — ABNORMAL LOW (ref 12.0–15.0)
Lymphocytes Relative: 5 %
Lymphs Abs: 0.8 K/uL (ref 0.7–4.0)
MCH: 25.4 pg — ABNORMAL LOW (ref 26.0–34.0)
MCHC: 29.7 g/dL — ABNORMAL LOW (ref 30.0–36.0)
MCV: 85.4 fL (ref 78.0–100.0)
Monocytes Absolute: 0.8 K/uL (ref 0.1–1.0)
Monocytes Relative: 5 %
Neutro Abs: 15.7 K/uL — ABNORMAL HIGH (ref 1.7–7.7)
Neutrophils Relative %: 90 %
Platelets: 318 K/uL (ref 150–400)
RBC: 3.98 MIL/uL (ref 3.87–5.11)
RDW: 18.1 % — ABNORMAL HIGH (ref 11.5–15.5)
WBC: 17.3 K/uL — ABNORMAL HIGH (ref 4.0–10.5)

## 2015-05-21 LAB — COMPREHENSIVE METABOLIC PANEL WITH GFR
ALT: 11 U/L — ABNORMAL LOW (ref 14–54)
AST: 16 U/L (ref 15–41)
Albumin: 2.9 g/dL — ABNORMAL LOW (ref 3.5–5.0)
Alkaline Phosphatase: 63 U/L (ref 38–126)
Anion gap: 10 (ref 5–15)
BUN: 20 mg/dL (ref 6–20)
CO2: 25 mmol/L (ref 22–32)
Calcium: 8.7 mg/dL — ABNORMAL LOW (ref 8.9–10.3)
Chloride: 108 mmol/L (ref 101–111)
Creatinine, Ser: 0.71 mg/dL (ref 0.44–1.00)
GFR calc Af Amer: >60 mL/min
GFR calc non Af Amer: >60 mL/min
Glucose, Bld: 154 mg/dL — ABNORMAL HIGH (ref 65–99)
Potassium: 3.9 mmol/L (ref 3.5–5.1)
Sodium: 143 mmol/L (ref 135–145)
Total Bilirubin: 0.7 mg/dL (ref 0.3–1.2)
Total Protein: 6.1 g/dL — ABNORMAL LOW (ref 6.5–8.1)

## 2015-05-21 LAB — HEPARIN LEVEL (UNFRACTIONATED): Heparin Unfractionated: 0.95 [IU]/mL — ABNORMAL HIGH (ref 0.30–0.70)

## 2015-05-21 LAB — MAGNESIUM: Magnesium: 2.3 mg/dL (ref 1.7–2.4)

## 2015-05-21 LAB — GLUCOSE, CAPILLARY: GLUCOSE-CAPILLARY: 149 mg/dL — AB (ref 65–99)

## 2015-05-21 MED ORDER — ALPRAZOLAM 0.25 MG PO TABS
0.2500 mg | ORAL_TABLET | Freq: Three times a day (TID) | ORAL | Status: DC | PRN
  Administered 2015-05-22: 0.25 mg via ORAL
  Filled 2015-05-21: qty 1

## 2015-05-21 MED ORDER — FUROSEMIDE 10 MG/ML IJ SOLN
20.0000 mg | Freq: Once | INTRAMUSCULAR | Status: AC
  Administered 2015-05-21: 20 mg via INTRAVENOUS
  Filled 2015-05-21: qty 2

## 2015-05-21 MED ORDER — FUROSEMIDE 10 MG/ML IJ SOLN
20.0000 mg | Freq: Once | INTRAMUSCULAR | Status: DC
  Filled 2015-05-21: qty 2

## 2015-05-21 MED ORDER — POTASSIUM CHLORIDE CRYS ER 20 MEQ PO TBCR
20.0000 meq | EXTENDED_RELEASE_TABLET | Freq: Once | ORAL | Status: AC
  Administered 2015-05-21: 20 meq via ORAL
  Filled 2015-05-21: qty 1

## 2015-05-21 MED ORDER — SODIUM CHLORIDE 0.9 % IV SOLN
250.0000 mg | Freq: Four times a day (QID) | INTRAVENOUS | Status: DC
  Administered 2015-05-21 – 2015-05-23 (×8): 250 mg via INTRAVENOUS
  Filled 2015-05-21 (×11): qty 2

## 2015-05-21 MED ORDER — INSULIN ASPART 100 UNIT/ML ~~LOC~~ SOLN
0.0000 [IU] | Freq: Three times a day (TID) | SUBCUTANEOUS | Status: DC
  Administered 2015-05-22: 1 [IU] via SUBCUTANEOUS
  Administered 2015-05-22 – 2015-05-23 (×3): 2 [IU] via SUBCUTANEOUS

## 2015-05-21 MED ORDER — ENOXAPARIN SODIUM 150 MG/ML ~~LOC~~ SOLN
135.0000 mg | Freq: Two times a day (BID) | SUBCUTANEOUS | Status: DC
  Administered 2015-05-21 – 2015-05-22 (×4): 135 mg via SUBCUTANEOUS
  Filled 2015-05-21 (×5): qty 0.9

## 2015-05-21 NOTE — Progress Notes (Signed)
OT Cancellation Note  Patient Details Name: MIKYLAH TOWNSON MRN: [id number] DOB: [birth year]   Cancelled Treatment:    Reason Eval/Treat Not Completed: Other (comment) (with palliative medicine meeting) OT to check back as appropriate and patients next availability.   [name]   OTR/L Pager: [phone] Office: [phone] .  05/21/2015, 11:39 AM

## 2015-05-21 NOTE — Consult Note (Signed)
Consultation Note Date: 05/21/2015   Patient Name: [name]  DOB: 09-19-1945  MRN: [id number]  Age / Sex: 70 y.o., female  PCP: Reynold Bowen, MD Referring Physician: Lavina Hamman, MD  Reason for Consultation: Establishing goals of care  HPI/Patient Profile: 70 y.o. female  with past medical history of smoker, GERD, gastric antral vascular ectasia, IBS, recurrent DVT, morbid obesity, arthritis, anxiety, depression, fibromyalgia, h/o breast cancer, melanoma, OSA, chronic hypoxic respiratory failure on 2L O2 prior admit for respiratory failure r/t ILD? admitted on 05/19/2015 from SNF found unresponsive, sats 30s.   Clinical Assessment and Goals of Care: [name] is very pleasant and very tearful throughout our conversation. Conversation challenging as she has impaired memory and confusion at times but at other times appears to have fairly insightful dialogue with me. When discussing her overall pulmonary decline she is tearful and expresses much fear and anxiety about being in the hospital and the unknown. She asks if she is dying - I told her that I do not know if I can answer this right now but that she has serious underlying lung disease that is likely to worsen and cause her death at some time. I asked her if she felt she was dying and she tells me "Yes I feel it is coming soon." She fears leaving her son [name]. She also tells me that he is getting married in 3 weeks (this was later refuted by [name] - his supposed fiance - there are no plans for marriage). I calmed her and reassured her that she is not alone and we will figure this out together. Encouraged her to utilize her faith during this time which seemed to bring her comfort (noted in past notes she is very spiritual). We were able to clarify code status and the limited benefit of CPR/shock without intubation and the poor prognosis that would follow  resuscitation for her - she agrees with DNR which is consistent with past conversations from our team.   I also spoke with [name] son and [name] ([place] girlfriend? Friend?) at her request. Confirm DNR consistent with her previously stated wishes. They also confirm that she is confused at times and will make statements regarding a marriage/wedding and trips to [place] that do not make sense. They are hopeful for some improvement based on a similar incident ~1 yr ago in which she was encouraged to accept hospice but did seem to improve without explanation as does not appear to have good follow up. She has been in SNF at [facility]. Right now will practice watchful waiting and explained that with no improvement or further decline hospice will be recommended and symptom management will be what we can offer [name].    NEXT OF KIN - son [name] as [name] is confused at times although she can participate and make known her wishes    Code Status/Advance Care Planning:  DNR   Symptom Management:   Anxiety: Recommend low dose Xanax 0.25 mg TID prn.   No pain.   Will need  low dose opioids with continued respiratory decline although h/o poor tolerance of morphine. Will follow for needs for now.   Palliative Prophylaxis:   Delirium Protocol   Psycho-social/Spiritual:   Desire for further Chaplaincy support:yes  Additional Recommendations: Caregiving  Support/Resources, Education on Hospice and Grief/Bereavement Support  Prognosis:   Unable to determine - poor based on chronic pulmonary disease that seems to be worsening but unpredictable pattern. With acute respiratory decline could be days to weeks.   Discharge Planning: To Be Determined      Primary Diagnoses: Present on Admission:  . HCAP (healthcare-associated pneumonia) . Acute on chronic respiratory failure with hypoxia ([place]) . Obstructive sleep apnea . Major depression, recurrent ([place]) . Chronic low back pain . Anemia, iron  deficiency . Anxiety  I have reviewed the medical record, interviewed the patient and family, and examined the patient. The following aspects are pertinent.  Past Medical History  Diagnosis Date  . Chronic low back pain 11/05/2005    s/p lumbar stenosis decompression surgery with residual pain  . Arthritis of left knee 01/30/2012  . Fibromyalgia 01/30/2012  . Deep venous thrombosis of lower extremity ([place]) 11/26/2010    Left, pulmonary embolism 1 month later per patient report.  On life-long anticoagulation.   . Chronic venous insufficiency 01/30/2012    Left lower extremity s/p DVT   . Morbid obesity with BMI of 40.0-44.9, adult ([place]) 01/02/2011  . Gastroesophageal reflux disease 01/30/2012  . 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 Benson Norway  . Major depression ([place]) 11/05/2005  . Anxiety 01/30/2012  . Hyperlipidemia LDL goal < 130 01/30/2012  . Urge urinary incontinence 03/25/2008    Urge predominant, minor component or stress.  Nocturnal.   . Obstructive sleep apnea 11/05/2005    Non-compliant with nocturnal nasal CPAP secondary to it being "ineffective"   . Tobacco abuse 11/05/2005  . Irritable bowel syndrome   . Interstitial cystitis     followed by Dr. Risa Grill  . Left cataract     Surgery pending  . Bell's palsy 1980's  . History of blood transfusion     During GAVE associated GI bleed  . Complication of anesthesia     Hard time putting patient to sleep  . Family history of anesthesia complication     "Mama couldn't wake up easy"  . Asymptomatic cholelithiasis 07/15/2013  . 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 Breast cancer ([place])     Bilateral s/p mastectomies  . Aortic atherosclerosis ([place]) 09/28/2014    Seen on CT scan, currently asymptomatic   Social History   Social History  . Marital Status: Single    Spouse Name: N/A  . Number of Children: 1  . Years of Education: N/A   Occupational History  . retired    Social History Main Topics    . Smoking status: Former Smoker -- 0.50 packs/day for 40 years    Types: Cigarettes    Quit date: 03/13/2014  . Smokeless tobacco: Never Used  . Alcohol Use: No  . Drug Use: No  . Sexual Activity: Not Asked     Comment: QUIT ONE MONTH AGO   Other Topics Concern  . None   Social History Narrative   Family History  Problem Relation Age of Onset  . Heart disease Mother   . Deep vein thrombosis Mother   . Cancer - Lung Brother   . Alcohol abuse Father   . Schizophrenia  Father   . Anxiety disorder Son   . Healthy Brother   . Healthy Brother    Scheduled Meds: . antiseptic oral rinse  7 mL Mouth Rinse q12n4p  . ceFEPime (MAXIPIME) IV  2 g Intravenous Q8H  . chlorhexidine  15 mL Mouth Rinse BID  . Chlorhexidine Gluconate Cloth  6 each Topical Q0600  . enoxaparin (LOVENOX) injection  135 mg Subcutaneous Q12H  . furosemide  20 mg Intravenous Once  . furosemide  20 mg Intravenous Once  . methylPREDNISolone (SOLU-MEDROL) injection  60 mg Intravenous Q6H  . multivitamin with minerals  1 tablet Oral Daily  . mupirocin ointment  1 application Nasal BID  . pneumococcal 23 valent vaccine  0.5 mL Intramuscular Tomorrow-1000  . potassium chloride  20 mEq Oral Once  . sodium chloride flush  3 mL Intravenous Q12H  . vancomycin  1,250 mg Intravenous Q12H   Continuous Infusions:  PRN Meds:.sodium chloride, ipratropium-albuterol, sodium chloride flush Medications Prior to Admission:  Prior to Admission medications   Medication Sig Start Date End Date Taking? Authorizing Provider  apixaban (ELIQUIS) 5 MG TABS tablet Take 1 tablet (5 mg total) by mouth 2 (two) times daily. 04/06/14  Yes Ejiroghene E Emokpae, MD  budesonide (PULMICORT) 0.25 MG/2ML nebulizer solution Take 2 mLs (0.25 mg total) by nebulization every 6 (six) hours. 04/06/14  Yes Ejiroghene Arlyce Dice, MD  calcium citrate-vitamin D (CITRACAL+D) 315-200 MG-UNIT per tablet Take 2 tablets by mouth 2 (two) times daily. 11/29/12  Yes  Oval Linsey, MD  Cholecalciferol (VITAMIN D3) 2000 units capsule Take 2,000 Units by mouth daily.   Yes Historical Provider, MD  dextromethorphan-guaiFENesin (MUCINEX DM) 30-600 MG 12hr tablet Take 1 tablet by mouth every 12 (twelve) hours as needed for cough.   Yes Historical Provider, MD  ferrous sulfate 325 (65 FE) MG EC tablet Take 325 mg by mouth 3 (three) times daily with meals. 03/23/12  Yes Oval Linsey, MD  gabapentin (NEURONTIN) 100 MG capsule Take 100 mg by mouth 3 (three) times daily.    Yes Historical Provider, MD  HYDROcodone-acetaminophen (NORCO) 10-325 MG per tablet Take 1-2 tablets by mouth every 6 (six) hours as needed for moderate pain (dyspnea, cough). 04/06/14  Yes Ejiroghene E Emokpae, MD  ipratropium-albuterol (DUONEB) 0.5-2.5 (3) MG/3ML SOLN Take 3 mLs by nebulization every 6 (six) hours. 04/06/14  Yes Ejiroghene Arlyce Dice, MD  lamoTRIgine (LAMICTAL) 200 MG tablet Take 200 mg by mouth every morning.   Yes Historical Provider, MD  LORazepam (ATIVAN) 1 MG tablet Take 2 mg by mouth at bedtime. Reported on 05/19/2015   Yes Historical Provider, MD  Multiple Vitamins-Minerals [facility] ADVANCED) TABS Take 1 tablet by mouth daily.   Yes Historical Provider, MD  nitrofurantoin (MACRODANTIN) 100 MG capsule Take 100 mg by mouth daily.   Yes Historical Provider, MD  pantoprazole (PROTONIX) 40 MG tablet Take 1 tablet (40 mg total) by mouth daily. Patient taking differently: Take 40 mg by mouth 2 (two) times daily.  02/16/13  Yes Oval Linsey, MD  pravastatin (PRAVACHOL) 80 MG tablet Take 1 tablet (80 mg total) by mouth daily.   Yes Oval Linsey, MD  predniSONE (DELTASONE) 5 MG tablet Take 5 mg by mouth daily with breakfast.   Yes Historical Provider, MD  QUEtiapine (SEROQUEL) 200 MG tablet Take 400 mg by mouth at bedtime.    Yes Historical Provider, MD  ranitidine (ZANTAC) 150 MG tablet Take 150 mg by mouth at bedtime.   Yes Historical Provider,  MD  sertraline (ZOLOFT) 100 MG  tablet Take 200 mg by mouth every morning.    Yes Historical Provider, MD  vitamin C (ASCORBIC ACID) 500 MG tablet Take 1 tablet (500 mg total) by mouth daily. 06/24/12  Yes Iran Planas, MD  albuterol (PROVENTIL) (2.5 MG/3ML) 0.083% nebulizer solution Take 3 mLs (2.5 mg total) by nebulization every 6 (six) hours as needed for shortness of breath. 11/09/13   Oval Linsey, MD  AMBULATORY NON FORMULARY MEDICATION Medication Name: oxygen 2 liters oxygen    Historical Provider, MD  fluconazole (DIFLUCAN) 100 MG tablet Take 1 tablet (100 mg total) by mouth daily. Patient not taking: Reported on 05/19/2015 12/07/14   Irene Shipper, MD  ondansetron (ZOFRAN) 4 MG tablet Take one tablet every 4-6 hours as needed for nausea Patient taking differently: Take 4 mg by mouth every 4 (four) hours as needed for nausea.  11/08/14   Irene Shipper, MD   Allergies  Allergen Reactions  . Adhesive [Tape] Other (See Comments)    "I break out all over"; Can only use paper tape  . Codeine Other (See Comments)    "first dose I took, my aide found me the next day laying on the floor"  . Morphine Other (See Comments)    caused change in mental status, she does not want to try it again.  . Sulfonamide Derivatives Nausea And Vomiting  . Thorazine [Chlorpromazine] Nausea Only   Review of Systems  Constitutional: Positive for fatigue.  Respiratory: Positive for shortness of breath.   Cardiovascular: Positive for chest pain.  Psychiatric/Behavioral: Negative for suicidal ideas and self-injury. The patient is nervous/anxious.     Physical Exam  Constitutional:  Obese   Cardiovascular: Normal rate.   Pulmonary/Chest: No accessory muscle usage. Tachypnea noted. No respiratory distress. She has decreased breath sounds in the right lower field and the left lower field.  Abdominal: Soft. Normal appearance.  Neurological: She is alert. She is disoriented.  Confusion fluctuates often during conversation. Poor memory. At one  moment she seems to have good insight and appropriate conversation then at another she is repeating the same questions she asked a few minutes ago.   Psychiatric: Her mood appears anxious. Cognition and memory are impaired.  Tearful     Vital Signs: BP 134/67 mmHg  Pulse 78  Temp(Src) 98.3 F (36.8 C) (Oral)  Resp 25  Ht 5\' 7"  (1.702 m)  Wt 137.8 kg (303 lb 12.7 oz)  BMI 47.57 kg/m2  SpO2 92% Pain Assessment: No/denies pain       SpO2: SpO2: 92 % O2 Device:SpO2: 92 % O2 Flow Rate: .O2 Flow Rate (L/min): 15 L/min  IO: Intake/output summary:  Intake/Output Summary (Last 24 hours) at 05/21/15 1203 Last data filed at 05/21/15 1000  Gross per 24 hour  Intake 493.86 ml  Output    800 ml  Net -306.14 ml    LBM: Last BM Date: 05/19/15 Baseline Weight: Weight: (!) 137.8 kg (303 lb 12.7 oz) Most recent weight: Weight: (!) 137.8 kg (303 lb 12.7 oz)     Palliative Assessment/Data:   Flowsheet Rows        Most Recent Value   Intake Tab    Referral Department  Hospitalist   Unit at Time of Referral  ER   Palliative Care Primary Diagnosis  Pulmonary   Date Notified  05/20/15   Palliative Care Type  Return patient Palliative Care   Reason for referral  Clarify Goals of Care  Date of Admission  05/19/15   Date first seen by Palliative Care  05/21/15   # of days Palliative referral response time  1 Day(s)   # of days IP prior to Palliative referral  1   Clinical Assessment    Psychosocial & Spiritual Assessment    Palliative Care Outcomes       Time In: 1100 Time Out: 1220 Time Total: 42min Greater than 50%  of this time was spent counseling and coordinating care related to the above assessment and plan.  Signed by: Pershing Proud, NP   Please contact Palliative Medicine Team phone at [phone] for questions and concerns.  For individual provider: See Shea Evans

## 2015-05-21 NOTE — Clinical Social Work Note (Signed)
CSW met with patient at bedside to confirm that she would be returning to [facility]. Patient stated that she does not want to go back but has to because of her insurance. CSW will facilitate discharge once medically stable.  Dayton Scrape, [place]

## 2015-05-21 NOTE — Progress Notes (Signed)
RT NOTE:  RT arrived to assess patient this morning and patient had removed BIPAP mask. PT titrated patient to VM 55%. Sats 92-93%, no distress noted. No SOB, WOB normal. RN aware. RT will monitor.

## 2015-05-21 NOTE — Progress Notes (Addendum)
Nutrition Brief Note  Received consult from COPD Gold Protocol.   Patient with no weight loss PTA.   Body mass index is 47.57 kg/(m^2). Patient meets criteria for class 3, extreme/morbid obesity based on current BMI.   Current diet order is NPO. Patient now off BiPAP, RN hopeful for diet advancement soon. Labs and medications reviewed. Palliative Care Team in with patient during RD visit.   No nutrition interventions warranted at this time. If nutrition issues arise, please consult RD.   Molli Barrows, RD, LDN, [place] Pager [phone] After Hours Pager [phone]

## 2015-05-21 NOTE — Consult Note (Signed)
Name: [name] MRN: [id number] DOB: [birth year]    ADMISSION DATE:  05/19/2015 CONSULTATION DATE:  05/21/15  REFERRING MD :  Dr. Posey Pronto / [facility]   CHIEF COMPLAINT:  SOB    HISTORY OF PRESENT ILLNESS:  70 y/o F, smoker (20 pk years, quit 02/2014), SNF resident, with a PMH of GERD, gastric antral vascular ectasia, IBS, morbid obesity, arthritis, anxiety, depression, fibromyalgia, breast cancer, melanoma, OSA (**), chronic hypoxic respiratory failure on 2L O2, prior admit for respiratory failure / ? ARDS vs ILD/HP and DVT (on Eliquis) who presented to [facility] on 4/29 via EMS with reports of unresponsiveness and desaturations to 30's.    The patient was initially treated per EMS with 100% NRB, albuterol nebulization with some improvement in mental status.  On arrival to ER, she was alert / oriented and reported decreased energy for 2 days, 1 week of worsening cough & SOB.  The patient had attempted her regular inhalers at SNF without relief of symptoms.  Initial ABG 7.468 / CO2 41 / O2 63 / Bicarb 30.3, WBC 17.6, BNP 347, Hgb 10.4 and negative troponin. Initial CXR demonstrated progressive ILD with possible superimposed infection.  She was admitted per IMTS and treated with IV antibiotics, steroids, oxygen, nebulized bronchodilators and bipap.   She was previously seen by Dr. Chase Caller in 02/2014 for concerns of ILD.  A CT of the chest was performed at that time which demonstrated an "unusual appearance", definite evidence of progressive ILD, ? Chronic HP, AIP, component edema, right greater than left and apical greater than basal distribution, progressive adenopathy, worsening splenomegaly.  Unfortunately, she did not follow up regarding results.    PCCM consulted for evaluation of ILD, hypoxemic respiratory failure.  Pt denies hx of CHF / HTN.  She reports one month history of projective vomiting that occurs without warning.  Reports dry cough but no sputum production.   Pulmonary Hx:  Reports wood  burning stove as a child, smoker for 5 years up to 4-5 ppd, previously had pet dove for 1-2 years which she gave up in 2016, husband smoked as well.  She worked in a [employer]. No military exposures.    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  has past surgical history that includes Cervical discectomy (11/1995 and 05/2002); Abdominal hysterectomy (1976); Bladder suspension (1993); ORIF ankle fracture (1993); Lumbar laminectomy/decompression microdiscectomy (02/2007); Carpal tunnel release (Left); Tonsillectomy and adenoidectomy (1953); Appendectomy; Dilation and curettage of uterus; Tubal ligation; Carpectomy (07/23/2011); Esophagogastroduodenoscopy (12/25/2011); Breast surgery (1992); Rhinoplasty (1993); Spine surgery; Hot hemostasis (02/06/2012); Esophagogastroduodenoscopy (02/06/2012); Esophagogastroduodenoscopy (N/A, 04/02/2012); Back surgery; Tendon transfer (Left, 06/24/2012); Mastectomy (Bilateral); Melanoma excision (Left); and Tendon transfer (Left, 06/24/2012).    Prior to Admission medications   Medication Sig Start  Date End Date Taking? Authorizing Provider  apixaban (ELIQUIS) 5 MG TABS tablet Take 1 tablet (5 mg total) by  mouth 2 (two) times daily. 04/06/14  Yes Ejiroghene E Emokpae, MD  budesonide (PULMICORT) 0.25 MG/2ML nebulizer solution Take 2 mLs (0.25 mg total) by nebulization every 6 (six) hours. 04/06/14  Yes Ejiroghene Arlyce Dice, MD  calcium citrate-vitamin D (CITRACAL+D) 315-200 MG-UNIT per tablet Take 2 tablets by mouth 2 (two) times daily. 11/29/12  Yes Oval Linsey, MD  Cholecalciferol (VITAMIN D3) 2000 units capsule Take 2,000 Units by mouth daily.   Yes Historical Provider, MD  dextromethorphan-guaiFENesin (MUCINEX DM) 30-600 MG 12hr tablet Take 1 tablet by mouth every 12 (twelve) hours as needed for cough.   Yes Historical Provider, MD  ferrous sulfate 325 (65 FE) MG EC tablet Take 325 mg by mouth 3 (three) times daily with meals. 03/23/12  Yes Oval Linsey, MD  gabapentin (NEURONTIN) 100 MG capsule Take 100 mg by mouth 3 (three) times daily.    Yes Historical Provider, MD  HYDROcodone-acetaminophen (NORCO) 10-325 MG per tablet Take 1-2 tablets by mouth every 6 (six) hours as needed for moderate pain (dyspnea, cough). 04/06/14  Yes Ejiroghene E Emokpae, MD  ipratropium-albuterol (DUONEB) 0.5-2.5 (3) MG/3ML SOLN Take 3 mLs by nebulization every 6 (six) hours. 04/06/14  Yes Ejiroghene Arlyce Dice, MD  lamoTRIgine (LAMICTAL) 200 MG tablet Take 200 mg by mouth every morning.   Yes Historical Provider, MD  LORazepam (ATIVAN) 1 MG tablet Take 2 mg by mouth at bedtime. Reported on 05/19/2015   Yes Historical Provider, MD  Multiple Vitamins-Minerals [facility] ADVANCED) TABS Take 1 tablet by mouth daily.   Yes Historical Provider, MD  nitrofurantoin (MACRODANTIN) 100 MG capsule Take 100 mg by mouth daily.   Yes Historical Provider, MD  pantoprazole (PROTONIX) 40 MG tablet Take 1 tablet (40 mg total) by mouth daily. Patient taking differently: Take 40 mg by mouth 2 (two) times daily.  02/16/13  Yes Oval Linsey, MD  pravastatin (PRAVACHOL) 80 MG tablet Take 1 tablet (80 mg total) by mouth daily.   Yes Oval Linsey, MD  predniSONE (DELTASONE) 5 MG tablet Take 5 mg by mouth daily with breakfast.   Yes Historical Provider, MD  QUEtiapine (SEROQUEL) 200 MG tablet Take 400 mg by mouth at bedtime.    Yes Historical Provider, MD  ranitidine (ZANTAC) 150 MG tablet Take 150 mg by mouth at bedtime.   Yes Historical Provider, MD  sertraline (ZOLOFT) 100 MG tablet Take 200 mg by mouth every morning.    Yes Historical Provider, MD  vitamin C (ASCORBIC ACID) 500 MG tablet Take 1 tablet (500 mg total) by mouth daily. 06/24/12  Yes Iran Planas, MD  albuterol (PROVENTIL) (2.5 MG/3ML) 0.083% nebulizer solution Take 3 mLs (2.5 mg total) by nebulization every 6 (six) hours as needed for shortness of breath. 11/09/13   Oval Linsey, MD  AMBULATORY NON FORMULARY MEDICATION Medication Name: oxygen 2 liters oxygen    Historical Provider, MD  fluconazole (DIFLUCAN) 100 MG tablet Take 1 tablet (100 mg total) by mouth daily. Patient not taking: Reported on 05/19/2015 12/07/14   Irene Shipper, MD  ondansetron (ZOFRAN) 4 MG tablet Take one tablet every 4-6 hours as needed for nausea Patient taking differently: Take 4 mg by mouth every 4 (four) hours as needed for nausea.  11/08/14   Irene Shipper, MD   Allergies  Allergen Reactions  . Adhesive [Tape] Other (See Comments)    "I break out all over"; Can only use paper tape  . Codeine Other (See Comments)    "first dose I took,  my aide found me the next day laying on the floor"  . Morphine Other (See Comments)    caused change in mental status, she does not want to try it again.  . Sulfonamide Derivatives Nausea And Vomiting  . Thorazine [Chlorpromazine] Nausea Only    FAMILY HISTORY:  family history includes Alcohol abuse in her father; Anxiety disorder in her son; Cancer - Lung in her brother; Deep vein thrombosis in her mother; Healthy in her brother and brother; Heart  disease in her mother; Schizophrenia in her father.   SOCIAL HISTORY:  reports that she quit smoking about 14 months ago. Her smoking use included Cigarettes. She has a 20 pack-year smoking history. She has never used smokeless tobacco. She reports that she does not drink alcohol or use illicit drugs.  REVIEW OF SYSTEMS:  POSITIVES IN BOLD Constitutional: Negative for fever, chills, weight loss, malaise/fatigue and diaphoresis.  HENT: Negative for hearing loss, ear pain, nosebleeds, congestion, sore throat, neck pain, tinnitus and ear discharge.   Eyes: Negative for blurred vision, double vision, photophobia, pain, discharge and redness.  Respiratory: Negative for cough, hemoptysis, sputum production, shortness of breath, wheezing and stridor.   Cardiovascular: Negative for chest pain, palpitations, orthopnea, claudication, leg swelling and PND.  Gastrointestinal: Negative for heartburn, nausea, vomiting, abdominal pain, diarrhea, constipation, blood in stool and melena.  Genitourinary: Negative for dysuria, urgency, frequency, hematuria and flank pain.  Musculoskeletal: Negative for myalgias, back pain, joint pain and falls.  Skin: Negative for itching and rash.  Neurological: Negative for dizziness, tingling, tremors, sensory change, speech change, focal weakness, seizures, loss of consciousness, weakness and headaches.  Endo/Heme/Allergies: Negative for environmental allergies and polydipsia. Does not bruise/bleed easily.  SUBJECTIVE:   VITAL SIGNS: Temp:  [97.3 F (36.3 C)-98.5 F (36.9 C)] 98.3 F (36.8 C) (05/01 0739) Pulse Rate:  [63-78] 78 (05/01 0739) Resp:  [19-28] 25 (05/01 0739) BP: (116-136)/(53-67) 134/67 mmHg (05/01 0739) SpO2:  [90 %-97 %] 92 % (05/01 0934) FiO2 (%):  [50 %-55 %] 55 % (05/01 0400)  PHYSICAL EXAMINATION: General:  Morbidly obese female in NAD Neuro:  Awake/alert, answers appropriately, speech clear, generalized weakness  HEENT:  MM  pink/moist Cardiovascular:  s1s2 rrr, no m/r/g Lungs:  Mild tachypnea but no distress, lungs bilaterally essentially clear, R basilar fine crackles Abdomen:  Obese/soft, bsx4 active  Musculoskeletal:  No acute deformities  Skin:  Warm/dry, no edema   Recent Labs Lab 05/19/15 2033 05/21/15 0820  NA 140 143  K 3.8 3.9  CL 102 108  CO2 28 25  BUN 9 20  CREATININE 0.74 0.71  GLUCOSE 125* 154*    Recent Labs Lab 05/19/15 2033 05/21/15 0820  HGB 10.4* 10.1*  HCT 35.1* 34.0*  WBC 17.6* 17.3*  PLT 322 318   Dg Chest Port 1 View  05/21/2015  CLINICAL DATA:  Hypoxia EXAM: PORTABLE CHEST 1 VIEW COMPARISON:  05/19/2015 FINDINGS: Diffuse interstitial and airspace disease throughout the lungs bilaterally, stable. Heart is mildly enlarged. No visible effusions or acute bony abnormality. IMPRESSION: Extensive interstitial and alveolar opacities throughout both lungs, stable. Electronically Signed   By: Rolm Baptise M.D.   On: 05/21/2015 09:06   Dg Chest Portable 1 View  05/19/2015  CLINICAL DATA:  Shortness of breath and hypoxia EXAM: PORTABLE CHEST 1 VIEW COMPARISON:  04/04/2014 FINDINGS: There is chronic diffuse coarse interstitial opacity which is stable from prior. Lung volumes are normal to low. Chronic cardiomegaly. There is mediastinal and hilar adenopathy, confirmed on  2016 chest CT. IMPRESSION: 1. Interstitial lung disease with diffuse opacity that is stable compared to 04/04/2014. Chronic changes are extensive enough that superimposed pneumonia or edema would easily be obscured. 2. Chronic cardiomegaly. Electronically Signed   By: Monte Fantasia M.D.   On: 05/19/2015 20:59   SIGNIFICANT EVENTS  4/29  Admit after being found unresponsive, hypoxic with sats in 30's  STUDIES:  03/17/14  CT Chest w Contrast >> unusual appearance, definite evidence of progressive ILD, ? Chronic HP, AIP, component edema, right greater than left and apical greater than basal distribution, progressive  adenopathy, worsening splenomegaly   ASSESSMENT / PLAN:   Interstitial Lung Disease - former smoker with prior concerns for ILD - specifically chronic hypersensitivity pneumonitis, acute interstitial PNA, ? Chronic aspiration (hx of projectile vomiting).  Radiographic review demonstrates progression of disease.  She has not had biopsy to define process.  At this point, doubt she could withstand a procedure to better define ILD.  Rule out acute infection superimposed on ILD.  Although, patient does not meet SIRS criteria - initial WBC could have been stress response and tachypnea in setting of ILD.  Prior ECHO in 2013 with PA Peak 40-44, normal EF.     Plan: Continue solumedrol at current dose  BiPAP support PRN for increased work of breathing  O2 for sats > 90%, consider oxymizer for discharge to allow higher O2 flow delivery  Agree with Palliative Care consult and symptom control with benzodiazepines / narcotics if patient willing  DNI.  Need to further discuss CPR with patient   Pulmonary hygiene as tolerated - mobilize, IS Lasix 40 mg IV x1 + KCL  Re-assess HR CT of the chest to review ILD progression    Vomiting - prior to admit, 1 month hx  Plan: Defer to primary PRN zofran    Code Status  - agree with DNI.  Discussed concept of CPR with patient and how CPR / intubation go hand in hand.  Encouraged her to think about concept of CPR.  Defer further discussions to Palliative Care.    Noe Gens, NP-C [facility] Pgr: [phone] or if no answer [phone] 05/21/2015, 10:54 AM

## 2015-05-21 NOTE — Evaluation (Signed)
Physical Therapy Evaluation Patient Details Name: [name] MRN: [id number] DOB: 1945/03/25 Today's Date: 05/21/2015   History of Present Illness  69 y.o. female admitted to [facility] on 05/19/15 for repiratory distress.  Dx with acute on chronic hypoxic respiratory failure secondary to HCAP, anemia, and recurrent VTE.  Pt with significant PMhx of chronic venous insufficiency, morbid obesity, Bell's palsey, breast CA s/p bil mastectomies, aortic athrosclerosis, ORIF ankle fx, back surgery x 3.   Clinical Impression  Pt was only able to sit EOB due to increased DOE once sitting (she could not even square up her hips as this was too much physical exertion).  She is on HFNC and O2 sats immediately dropped into the low 80s.  She rebounded to the mid to upper 80s seated, but only sat for ~ 5 mins before going back to supine. She continues to be appropriate for SNF at discharge.   PT to follow acutely for deficits listed below.       Follow Up Recommendations SNF    Equipment Recommendations  None recommended by PT    Recommendations for Other Services   NA    Precautions / Restrictions Precautions Precautions: Fall;Other (comment) Precaution Comments: monitor O2 sats      Mobility  Bed Mobility Overal bed mobility: Needs Assistance Bed Mobility: Supine to Sit;Sit to Supine     Supine to sit: Supervision Sit to supine: Supervision   General bed mobility comments: supervision for safety as pt was very close to EOB.   Transfers                 General transfer comment: Pt unable to do more than EOB due to increased DOE.           Balance Overall balance assessment: Needs assistance Sitting-balance support: Bilateral upper extremity supported Sitting balance-Leahy Scale: Poor Sitting balance - Comments: Pt needed bil upper extremities supported EOB.                                      Pertinent Vitals/Pain Pain Assessment: No/denies pain    Home  Living Family/patient expects to be discharged to:: Skilled nursing facility ([facility]) Living Arrangements: Alone   Type of Home: House Home Access: Stairs to enter Entrance Stairs-Rails: None Entrance Stairs-Number of Steps: 2-3 Home Layout: One level   Additional Comments: per pt report she has been in rehab at [facility] for 2 years. She may not be the most accurate historian    Prior Function Level of Independence: Needs assistance   Gait / Transfers Assistance Needed: uses a walker "most of the time"           Hand Dominance   Dominant Hand: Right    Extremity/Trunk Assessment   Upper Extremity Assessment: Defer to OT evaluation           Lower Extremity Assessment: Generalized weakness      Cervical / Trunk Assessment: Other exceptions  Communication   Communication: No difficulties  Cognition Arousal/Alertness: Awake/alert Behavior During Therapy: Anxious Overall Cognitive Status: No family/caregiver present to determine baseline cognitive functioning                               Assessment/Plan    PT Assessment Patient needs continued PT services  PT Diagnosis Difficulty walking;Abnormality of gait;Generalized weakness   PT Problem List  Decreased strength;Decreased activity tolerance;Decreased balance;Decreased mobility;Decreased knowledge of use of DME;Cardiopulmonary status limiting activity;Obesity  PT Treatment Interventions DME instruction;Gait training;Functional mobility training;Therapeutic activities;Therapeutic exercise;Balance training;Neuromuscular re-education;Patient/family education   PT Goals (Current goals can be found in the Care Plan section) Acute Rehab PT Goals Patient Stated Goal: to stop getting PNA PT Goal Formulation: With patient Time For Goal Achievement: 06/04/15 Potential to Achieve Goals: Good    Frequency Min 2X/week           End of Session Equipment Utilized During Treatment:  Oxygen Activity Tolerance: Treatment limited secondary to medical complications (Comment) (limited by DOE) Patient left: in bed;with call bell/phone within reach Nurse Communication: Mobility status         Time: [id number] PT Time Calculation (min) (ACUTE ONLY): 16 min   Charges:   PT Evaluation $PT Eval Moderate Complexity: 1 Procedure          Darah Simkin B. [place], [place], DPT [phone]   05/21/2015, 4:49 PM

## 2015-05-21 NOTE — Progress Notes (Signed)
[facility] Progress Note  Patient: [name] [id number]   PCP: Sheela Stack, MD DOB: [birth year]   DOA: 05/19/2015   DOS: 05/21/2015   Date of Service: the patient was seen and examined on 05/21/2015 Outpatient Specialists: none  Subjective: The patient complains of cough and shortness of breath. No chest pain or abdominal pain. No nausea no vomiting. Complains of fatigue as well. Significantly anxious. Nutrition: Was nothing by mouth last night  Brief hospital course: Patient was admitted on 05/19/2015, with complaint of shortness of breath, was found to have acute on chronic hypoxic respiratory failure concerning with pneumonia as well as IPF exacerbation. Patient was started on broad-spectrum antibiotics as well as BiPAP. She was also started on Solu-Medrol. ABG showed profound hypoxia despite being on BiPAP. Currently further plan is continue Solu-Medrol and monitor clinical progress.  Assessment and Plan: 1. Acute on chronic respiratory failure with hypoxia (HCC) Acute exacerbation of interstitial pulmonary lung disease. Suspected healthcare associated pneumonia. Patient has chronic interstitial lung disease and her current presentation is likely secondary to acute exacerbation of the same. Appreciate input from pulmonary. CT of the chest is ordered. Solu-Medrol is increased. Continue broad-spectrum antibiotics and continue following cultures. Palliative care consulted as well.  2. Suspected chronic diastolic dysfunction. Chronically elevated BNP for the patient. On admission it was elevated 347. An acute component of chronic diastolic dysfunction with CHF cannot be ruled out. I would you patient's 20 mg of Lasix and monitor response.  3. History of recurrent PTE. Patient has been on Eliquis and has been transitioned to heparin here in the hospital. Suspicion for transition to heparin is because of the poor by mouth intake. I would transition the patient back  to Lovenox at present. Pharmacy consulted.  4. Goals of care discussion. Patient has significantly poor prognosis given her profound hypoxia as well as interstitial lung disease. Appreciate input from pulmonary as well as palliative care. Currently plan is to monitor patient's progress over 24-48 hours with high-dose steroids for improvement in hypoxia. Patient's CODE STATUS today is changed to DO NOT RESUSCITATE DO NOT INTUBATE.  5. Poor by mouth intake. Likely due to patient being on BiPAP. I would advance the diet as tolerated to cardiac diet. Monitor for evidence of aspiration. The patient is able to tolerate oral diet will transition all medications to oral as well.  6. Requirement for high dose steroids. Patient will be placed on sensitive sliding scale.  7. Depression and anxiety. Continue Seroquel and Zoloft. May need some lorazepam.  8. Iron deficiency anemia. Continue iron supplementation.  Pain management: Currently on Tylenol Activity: physical therapy recommends SNF Bowel regimen: last BM 05/19/2015 Diet: Cardiac diet DVT Prophylaxis: on therapeutic anticoagulation.  Advance goals of care discussion: dNR/DNI.  Family Communication: family was present at bedside, at the time of interview. The pt provided permission to discuss medical plan with the family. Opportunity was given to ask question and all questions were answered satisfactorily.   Disposition:  Barriers to safe discharge: Profound hypoxia  Consultants: Palliative care, pulmonary Procedures: None  Antibiotics: Anti-infectives    Start     Dose/Rate Route Frequency Ordered Stop   05/20/15 0800  vancomycin (VANCOCIN) 1,250 mg in sodium chloride 0.9 % 250 mL IVPB     1,250 mg 166.7 mL/hr over 90 Minutes Intravenous Every 12 hours 05/20/15 0132 05/27/15 2359   05/20/15 0600  ceFEPIme (MAXIPIME) 2 g in dextrose 5 % 50 mL IVPB     2 g 100  mL/hr over 30 Minutes Intravenous Every 8 hours 05/20/15 0101  05/28/15 0559   05/19/15 2230  vancomycin (VANCOCIN) IVPB 1000 mg/200 mL premix     1,000 mg 200 mL/hr over 60 Minutes Intravenous  Once 05/19/15 2216 05/20/15 0013   05/19/15 2230  piperacillin-tazobactam (ZOSYN) IVPB 3.375 g     3.375 g 100 mL/hr over 30 Minutes Intravenous  Once 05/19/15 2216 05/19/15 2310        Intake/Output Summary (Last 24 hours) at 05/21/15 1752 Last data filed at 05/21/15 1543  Gross per 24 hour  Intake 1154.86 ml  Output   1026 ml  Net 128.86 ml   Filed Weights   05/20/15 0118  Weight: 137.8 kg (303 lb 12.7 oz)    Objective: Physical Exam: Filed Vitals:   05/21/15 1203 05/21/15 1524 05/21/15 1613 05/21/15 1624  BP: 110/99 133/73    Pulse: 78 82  78  Temp: 98.5 F (36.9 C) 98.1 F (36.7 C)    TempSrc: Oral Oral    Resp: 18 23  20   Height:      Weight:      SpO2: 87% 94% 93% 92%     General: Appear in moderate distress, no Rash; Oral Mucosa moist. Cardiovascular: S1 and S2 Present, no Murmur, no JVD Respiratory: Bilateral Air entry present and bilateral  Crackles,  wheezes Abdomen: Bowel Sound present, Soft and no tenderness Extremities: no Pedal edema, no calf tenderness Neurology: Grossly no focal neuro deficit.  Data Reviewed: CBC:  Recent Labs Lab 05/19/15 2033 05/21/15 0820  WBC 17.6* 17.3*  NEUTROABS  --  15.7*  HGB 10.4* 10.1*  HCT 35.1* 34.0*  MCV 85.6 85.4  PLT 322 0000000   Basic Metabolic Panel:  Recent Labs Lab 05/19/15 2033 05/21/15 0820  NA 140 143  K 3.8 3.9  CL 102 108  CO2 28 25  GLUCOSE 125* 154*  BUN 9 20  CREATININE 0.74 0.71  CALCIUM 9.0 8.7*  MG  --  2.3    Liver Function Tests:  Recent Labs Lab 05/21/15 0820  AST 16  ALT 11*  ALKPHOS 63  BILITOT 0.7  PROT 6.1*  ALBUMIN 2.9*   No results for input(s): LIPASE, AMYLASE in the last 168 hours. No results for input(s): AMMONIA in the last 168 hours. Coagulation Profile: No results for input(s): INR, PROTIME in the last 168 hours. Cardiac  Enzymes: No results for input(s): CKTOTAL, CKMB, CKMBINDEX, TROPONINI in the last 168 hours. BNP (last 3 results) No results for input(s): PROBNP in the last 8760 hours.  CBG: No results for input(s): GLUCAP in the last 168 hours.  Studies: Ct Chest High Resolution  05/21/2015  CLINICAL DATA:  70 year old female inpatient with shortness of breath and hypoxia. History of interstitial lung disease. EXAM: CT CHEST WITHOUT CONTRAST TECHNIQUE: Multidetector CT imaging of the chest was performed following the standard protocol without intravenous contrast. High resolution imaging of the lungs, as well as inspiratory and expiratory imaging, was performed. COMPARISON:  03/17/2014 chest CT.  05/21/2015 chest radiograph. FINDINGS: Mediastinum/Nodes: Stable top-normal heart size. Trace left pericardial fluid/ thickening is not definitely changed. Atherosclerotic nonaneurysmal thoracic aorta. Normal caliber pulmonary arteries. Normal visualized thyroid. Oral contrast in the mid to lower thoracic esophagus suggests esophageal dysmotility and/ or gastroesophageal reflux. No axillary adenopathy. Moderate bilateral paratracheal, AP window and subcarinal lymphadenopathy appears stable, for example a 1.8 cm right paratracheal node (series 4/image 30), a 2.0 cm AP window node (series 4/ image 41) and a  2.0 cm subcarinal node (series 4/ image 69), all unchanged since 03/17/2014. There is a least mild bilateral hilar lymphadenopathy, not appreciably changed. Lungs/Pleura: No pneumothorax. Trace layering bilateral pleural effusions. There is extensive (nearly diffuse) ground-glass attenuation throughout both lungs with associated extensive interlobular septal thickening (crazy paving), with associated extensive peribronchovascular interstitial thickening and reticulation throughout both lungs involving the peribronchovascular, perilobular and subpleural regions. There is mild-to-moderate traction bronchiectasis throughout both  lungs. There is evidence of architectural distortion in the involved lungs bilaterally. These findings have significantly progressed since 03/17/2014. Assessment for air trapping is limited by the extensive lung opacification. Subpleural cystic change in the upper lobes is not definitely changed, favor paraseptal emphysema. Upper abdomen: Calcified 13 mm gallstone in the nondistended gallbladder. Musculoskeletal: No aggressive appearing focal osseous lesions. Moderate degenerative changes in the thoracic spine. Partially visualized surgical hardware from ACDF in the lower cervical spine. Stable ruptured left breast prosthesis. Stable intact appearing right breast prostheses. IMPRESSION: 1. Extensive (nearly diffuse) crazy paving throughout both lungs superimposed on findings of fibrotic interstitial lung disease characterized by mild-to-moderate traction bronchiectasis, extensive peribronchovascular interstitial thickening and reticulation throughout the central and peripheral regions of the lungs. Findings have significantly progressed since 03/17/2014. 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 Other causes of acute crazy paving such as pulmonary edema, alveolar hemorrhage, acute interstitial pneumonia (AIP) or atypical infection cannot be excluded although are considered less likely. 2. Trace bilateral pleural effusions. 3. Moderate mediastinal and bilateral hilar lymphadenopathy is stable since 03/17/2014, most consistent with benign reactive adenopathy. 4. Cholelithiasis. Electronically Signed   By: Ilona Sorrel M.D.   On: 05/21/2015 14:23   Dg Chest Port 1 View  05/21/2015  CLINICAL DATA:  Hypoxia EXAM: PORTABLE CHEST 1 VIEW COMPARISON:  05/19/2015 FINDINGS: Diffuse interstitial and airspace disease throughout the lungs bilaterally, stable. Heart is mildly enlarged. No visible effusions or acute bony abnormality. IMPRESSION: Extensive interstitial and alveolar  opacities throughout both lungs, stable. Electronically Signed   By: Rolm Baptise M.D.   On: 05/21/2015 09:06     Scheduled Meds: . antiseptic oral rinse  7 mL Mouth Rinse q12n4p  . ceFEPime (MAXIPIME) IV  2 g Intravenous Q8H  . chlorhexidine  15 mL Mouth Rinse BID  . Chlorhexidine Gluconate Cloth  6 each Topical Q0600  . enoxaparin (LOVENOX) injection  135 mg Subcutaneous Q12H  . furosemide  20 mg Intravenous Once  . methylPREDNISolone (SOLU-MEDROL) injection  250 mg Intravenous Q6H  . multivitamin with minerals  1 tablet Oral Daily  . mupirocin ointment  1 application Nasal BID  . pneumococcal 23 valent vaccine  0.5 mL Intramuscular Tomorrow-1000  . sodium chloride flush  3 mL Intravenous Q12H  . vancomycin  1,250 mg Intravenous Q12H   Continuous Infusions:  PRN Meds: sodium chloride, ALPRAZolam, ipratropium-albuterol, sodium chloride flush  Time spent: 30 minutes  Author: Berle Mull, MD [facility] Pager: [phone] 05/21/2015 5:52 PM  If 7PM-7AM, please contact night-coverage at [email or web address], password [facility]

## 2015-05-21 NOTE — Progress Notes (Signed)
Pt off bipap at this time and doing well.  RT will monitor.

## 2015-05-21 NOTE — Progress Notes (Signed)
ANTICOAGULATION CONSULT NOTE - Follow Up Consult  Pharmacy Consult:  Heparin >> Lovenox (Eliquis on hold) Indication:  History of PE/DVT  Allergies  Allergen Reactions  . Adhesive [Tape] Other (See Comments)    "I break out all over"; Can only use paper tape  . Codeine Other (See Comments)    "first dose I took, my aide found me the next day laying on the floor"  . Morphine Other (See Comments)    caused change in mental status, she does not want to try it again.  . Sulfonamide Derivatives Nausea And Vomiting  . Thorazine [Chlorpromazine] Nausea Only    Patient Measurements: Height: 5\' 7"  (170.2 cm) Weight: (!) 303 lb 12.7 oz (137.8 kg) IBW/kg (Calculated) : 61.6  Vital Signs: Temp: 98.3 F (36.8 C) (05/01 0739) Temp Source: Oral (05/01 0739) BP: 134/67 mmHg (05/01 0739) Pulse Rate: 78 (05/01 0739)  Labs:  Recent Labs  05/19/15 2033 05/20/15 1520 05/20/15 2238  HGB 10.4*  --   --   HCT 35.1*  --   --   PLT 322  --   --   APTT  --  31 56*  HEPARINUNFRC  --  0.90* 0.95*  CREATININE 0.74  --   --     Estimated Creatinine Clearance: 95.1 mL/min (by C-G formula based on Cr of 0.74).    Assessment: 54 YOF on Eliquis PTA for history of PE and DVT.  Patient was transitioned to IV heparin due to strict NPO status while using BiPAP.  Now to transition to Lovenox.  Her renal function is stable; no bleeding reported.   Goal of Therapy:  Anti-Xa level 0.6-1 units/ml 4hrs after LMWH dose given Monitor platelets by anticoagulation protocol: Yes Vanc trough 15-20 mc/mL    Plan:  - Heparin infusion already stopped per RN.  Start Lovenox 135mg  SQ Q12H - CBC Q72H while on Lovenox - F/U with resuming Eliquis when possible - Vanc 1250mg  IV Q12H x8 days total - Cefepime 2gm IV Q8H per MD x8 days total - Monitor renal fxn, clinical progress, vanc trough as indicated   June Rode D. Mina Marble, PharmD, BCPS Pager:  [phone] 05/21/2015, 9:30 AM

## 2015-05-21 NOTE — Progress Notes (Signed)
Chaplain stopped by to see [name]. However [name] said she didn't want to talk. She "wanted to eat and have some Orange Juice" Chaplain tried to explain to the Pt. That she wasn't able to have anything by mouth yet. This seems to be "new" news for [name]. Chaplain tried to engage her relating to how she might feel about she overall experience, however, [name] only wanted to talk about why she wasn't receiving food. [name] seems to be upset and confused. Chaplain spoke with the nurse about this issue. The Nurse reports that she had explain the "eating issue" to [name] over and over again. The Nurse has been forefront about why [name] cannot have anything by mouth. Chaplain has ask the nurse to explain again why she cannot receive anything by mouth. My assessment is that [name] is not truly aware of her medical condition. (There might be some denial at work here) [employer] will be more than happy to do some emotional and spiritual care surrounding [name]'s medical condition. Chaplain will ask the on-call chaplain to follow up a little later on.

## 2015-05-22 DIAGNOSIS — J189 Pneumonia, unspecified organism: Secondary | ICD-10-CM

## 2015-05-22 LAB — CBC WITH DIFFERENTIAL/PLATELET
Basophils Absolute: 0 10*3/uL (ref 0.0–0.1)
Basophils Relative: 0 %
EOS PCT: 0 %
Eosinophils Absolute: 0 10*3/uL (ref 0.0–0.7)
HEMATOCRIT: 35.6 % — AB (ref 36.0–46.0)
Hemoglobin: 10.5 g/dL — ABNORMAL LOW (ref 12.0–15.0)
LYMPHS ABS: 0.8 10*3/uL (ref 0.7–4.0)
LYMPHS PCT: 5 %
MCH: 25.2 pg — AB (ref 26.0–34.0)
MCHC: 29.5 g/dL — ABNORMAL LOW (ref 30.0–36.0)
MCV: 85.4 fL (ref 78.0–100.0)
MONO ABS: 0.4 10*3/uL (ref 0.1–1.0)
Monocytes Relative: 3 %
Neutro Abs: 13.8 10*3/uL — ABNORMAL HIGH (ref 1.7–7.7)
Neutrophils Relative %: 92 %
PLATELETS: 326 10*3/uL (ref 150–400)
RBC: 4.17 MIL/uL (ref 3.87–5.11)
RDW: 17.9 % — ABNORMAL HIGH (ref 11.5–15.5)
WBC: 15 10*3/uL — AB (ref 4.0–10.5)

## 2015-05-22 LAB — COMPREHENSIVE METABOLIC PANEL
ALT: 12 U/L — AB (ref 14–54)
AST: 16 U/L (ref 15–41)
Albumin: 2.8 g/dL — ABNORMAL LOW (ref 3.5–5.0)
Alkaline Phosphatase: 70 U/L (ref 38–126)
Anion gap: 8 (ref 5–15)
BILIRUBIN TOTAL: 0.8 mg/dL (ref 0.3–1.2)
BUN: 21 mg/dL — ABNORMAL HIGH (ref 6–20)
CALCIUM: 8.4 mg/dL — AB (ref 8.9–10.3)
CHLORIDE: 105 mmol/L (ref 101–111)
CO2: 28 mmol/L (ref 22–32)
CREATININE: 0.72 mg/dL (ref 0.44–1.00)
Glucose, Bld: 171 mg/dL — ABNORMAL HIGH (ref 65–99)
Potassium: 3.8 mmol/L (ref 3.5–5.1)
Sodium: 141 mmol/L (ref 135–145)
TOTAL PROTEIN: 5.7 g/dL — AB (ref 6.5–8.1)

## 2015-05-22 LAB — GLUCOSE, CAPILLARY
GLUCOSE-CAPILLARY: 169 mg/dL — AB (ref 65–99)
GLUCOSE-CAPILLARY: 141 mg/dL — AB (ref 65–99)
GLUCOSE-CAPILLARY: 178 mg/dL — AB (ref 65–99)
GLUCOSE-CAPILLARY: 169 mg/dL — AB (ref 65–99)

## 2015-05-22 LAB — LEGIONELLA PNEUMOPHILA SEROGP 1 UR AG: L. pneumophila Serogp 1 Ur Ag: NEGATIVE

## 2015-05-22 LAB — PROCALCITONIN: Procalcitonin: <0.1 ng/mL

## 2015-05-22 MED ORDER — SERTRALINE HCL 100 MG PO TABS
200.0000 mg | ORAL_TABLET | Freq: Every morning | ORAL | Status: DC
  Administered 2015-05-22 – 2015-05-28 (×7): 200 mg via ORAL
  Filled 2015-05-22 (×7): qty 2

## 2015-05-22 MED ORDER — PRAVASTATIN SODIUM 40 MG PO TABS
80.0000 mg | ORAL_TABLET | Freq: Every day | ORAL | Status: DC
  Administered 2015-05-22: 80 mg via ORAL
  Filled 2015-05-22: qty 2

## 2015-05-22 MED ORDER — PANTOPRAZOLE SODIUM 40 MG PO TBEC
40.0000 mg | DELAYED_RELEASE_TABLET | Freq: Two times a day (BID) | ORAL | Status: DC
  Administered 2015-05-22 – 2015-05-23 (×2): 40 mg via ORAL
  Filled 2015-05-22 (×2): qty 1

## 2015-05-22 MED ORDER — FUROSEMIDE 10 MG/ML IJ SOLN: 20.0000 mg | Freq: Once | INTRAMUSCULAR | Status: DC

## 2015-05-22 MED ORDER — LAMOTRIGINE 100 MG PO TABS
200.0000 mg | ORAL_TABLET | Freq: Every morning | ORAL | Status: DC
  Administered 2015-05-22 – 2015-05-28 (×7): 200 mg via ORAL
  Filled 2015-05-22 (×6): qty 2
  Filled 2015-05-22: qty 1

## 2015-05-22 NOTE — Clinical Social Work Note (Signed)
Clinical Social Work Assessment  Patient Details  Name: [name] MRN: [id number] Date of Birth: [birth year]  Date of referral:  05/20/15               Reason for consult:  Facility Placement, Discharge Planning                Permission sought to share information with:  Facility Sport and exercise psychologist, Family Supports Permission granted to share information::  Yes, Verbal Permission Granted  Name::     [name]  Agency::  SNF  Relationship::  Son  Contact Information:  [phone]  Housing/Transportation Living arrangements for the past 2 months:  [place] of Information:  Patient, Medical Team Patient Interpreter Needed:  None Criminal Activity/Legal Involvement Pertinent to Current Situation/Hospitalization:  No - Comment as needed Significant Relationships:  Adult Children, Friend Lives with:  Facility Resident Do you feel safe going back to the place where you live?  Yes Need for family participation in patient care:  Yes (Comment)  Care giving concerns:  PT recommends SNF placement following discharge.   Social Worker assessment / plan:  CSW met with patient. No supports at bedside. CSW introduced role and confirmed that patient will be going back to [facility] and [place] at discharge. Patient reported that she does not want to go back but has to because of her insurance. No further concerns. CSW will continue to follow patient and facilitate discharge once medically stable. CSW encouraged patient to contact CSW as needed.   Employment status:  Retired Nurse, adult PT Recommendations:  [place] / Referral to community resources:  [place]  Patient/Family's Response to care:  Patient understands that she has to go back to [facility] following discharge. Patient's son involved in patient's care and supportive.  Patient polite and appreciated social work intervention.  Patient/Family's Understanding of and Emotional Response to Diagnosis, Current Treatment, and Prognosis:  Patient knowledgeable of medical interventions and aware of discharge to [facility] and [facility].  Emotional Assessment Appearance:  Appears stated age Attitude/Demeanor/Rapport:   (Mild depression) Affect (typically observed):  Calm, Depressed, Pleasant Orientation:  Oriented to Self, Oriented to Place, Oriented to  Time, Oriented to Situation Alcohol / Substance use:  Tobacco Use Psych involvement (Current and /or in the community):  Outpatient Provider  Discharge Needs  Concerns to be addressed:  Care Coordination Readmission within the last 30 days:  No Current discharge risk:  Dependent with Mobility Barriers to Discharge:  No Barriers Identified   Candie Chroman, LCSW 05/22/2015, 11:54 AM

## 2015-05-22 NOTE — Progress Notes (Signed)
Daily Progress Note   Patient Name: [name]       Date: 05/22/2015 DOB: 1946/01/03  Age: 70 y.o. MRN#: [id number] Attending Physician: Lavina Hamman, MD Primary Care Physician: Sheela Stack, MD Admit Date: 05/19/2015  Reason for Consultation/Follow-up: Establishing goals of care  Subjective: [name] is sleeping and resting when I come by today. RN at bedside tells me that [name] has been very anxious and tearful most of the day. I believe that [name] is aware and fearful that she is likely approaching EOL. I spoke with both her son, [name], and friend, [name] (at [name] and [name]'s request). They both understand that unfortunately she is unlikely to improve much and that there is little more we can offer to improve her health. They also understand the use of anxiety and medications for comfort and agree with their use (they understand and accept the consequences and choose that she not suffer from anxiety). They also agree that she should not need BiPAP with further decline but to focus on comfort. Will continue with steroids for now but family understands that this is likely heading towards hospice for her. They do not wish for return to SNF.   - Focus is on comfort - Utilize comfort medications - No BiPAP **PLEASE REFRAIN FROM FURTHER CONVERSATIONS WITHOUT FAMILY PRESENCE REGARDING EOL**  Length of Stay: 3  Current Medications: Scheduled Meds:  . antiseptic oral rinse  7 mL Mouth Rinse q12n4p  . ceFEPime (MAXIPIME) IV  2 g Intravenous Q8H  . chlorhexidine  15 mL Mouth Rinse BID  . Chlorhexidine Gluconate Cloth  6 each Topical Q0600  . enoxaparin (LOVENOX) injection  135 mg Subcutaneous Q12H  . insulin aspart  0-9 Units Subcutaneous TID WC  . lamoTRIgine  200 mg Oral q morning -  10a  . methylPREDNISolone (SOLU-MEDROL) injection  250 mg Intravenous Q6H  . multivitamin with minerals  1 tablet Oral Daily  . mupirocin ointment  1 application Nasal BID  . pantoprazole  40 mg Oral BID AC  . pneumococcal 23 valent vaccine  0.5 mL Intramuscular Tomorrow-1000  . pravastatin  80 mg Oral q1800  . sertraline  200 mg Oral q morning - 10a  . sodium chloride flush  3 mL Intravenous Q12H  . vancomycin  1,250 mg Intravenous Q12H  Continuous Infusions:    PRN Meds: sodium chloride, ALPRAZolam, ipratropium-albuterol, sodium chloride flush  Physical Exam          Vital Signs: BP 120/67 mmHg  Pulse 73  Temp(Src) 98.3 F (36.8 C) (Oral)  Resp 17  Ht 5\' 7"  (1.702 m)  Wt 138.6 kg (305 lb 8.9 oz)  BMI 47.85 kg/m2  SpO2 91% SpO2: SpO2: 91 % O2 Device: O2 Device: NRB O2 Flow Rate: O2 Flow Rate (L/min): 15 L/min  Intake/output summary: No intake or output [name] in the 24 hours ending 05/22/15 1634 LBM: Last BM Date: 05/19/15 Baseline Weight: Weight: (!) 137.8 kg (303 lb 12.7 oz) Most recent weight: Weight: (!) 138.6 kg (305 lb 8.9 oz)       Palliative Assessment/[name]:    Flowsheet Rows        Most Recent Value   Intake Tab    Referral Department  Hospitalist   Unit at Time of Referral  ER   Palliative Care Primary Diagnosis  Pulmonary   Date Notified  05/20/15   Palliative Care Type  Return patient Palliative Care   Reason for referral  Clarify Goals of Care   Date of Admission  05/19/15   Date first seen by Palliative Care  05/21/15   # of days Palliative referral response time  1 Day(s)   # of days IP prior to Palliative referral  1   Clinical Assessment    Psychosocial & Spiritual Assessment    Palliative Care Outcomes       Patient Active Problem List   Diagnosis Date Noted  . Palliative care encounter   . Acute on chronic respiratory failure with hypoxia ([place]) 05/20/2015  . HCAP (healthcare-associated pneumonia) 05/19/2015  . Aortic  atherosclerosis ([place]) 09/28/2014  . Physical deconditioning 05/16/2014  . Psittacosis   . Dyspnea   . ILD (interstitial lung disease) ([place])   . Pulmonary infiltrates   . Respiratory failure ([place])   . Absolute anemia   . Vitamin D deficiency 03/22/2014  . Pneumonitis   . 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 CAP (community acquired pneumonia) 03/13/2014  . Acute respiratory failure with hypoxia ([place]) 03/13/2014  . Anemia, iron deficiency   . Asymptomatic cholelithiasis 07/15/2013  . Chronic cough 01/23/2013  . Osteoporosis 11/29/2012  . Abnormality of gait 04/26/2012  . Hyperlipidemia LDL goal <130 01/30/2012  . Anxiety 01/30/2012  . Arthritis of left knee 01/30/2012  . Chronic venous insufficiency 01/30/2012  . Fibromyalgia 01/30/2012  . Gastroesophageal reflux disease 01/30/2012  . Preventative health care 01/30/2012  . 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 Morbid obesity with BMI of 50.0-59.9, adult ([place]) 01/02/2011  . Deep venous embolism and thrombosis of left lower extremity ([place]) 11/26/2010  . Urge urinary incontinence 03/25/2008  . Tobacco abuse 11/05/2005  . Major depression, recurrent ([place]) 11/05/2005  . Chronic low back pain 11/05/2005  . Obstructive sleep apnea 11/05/2005    Palliative Care Assessment & Plan   Patient Profile: 70 y.o. female with past medical history of smoker, GERD, gastric antral vascular ectasia, IBS, recurrent DVT, morbid obesity, arthritis, anxiety, depression, fibromyalgia, h/o breast cancer, melanoma, OSA, chronic hypoxic respiratory failure on 2L O2 prior admit for respiratory failure r/t ILD? admitted on 05/19/2015 from SNF found unresponsive, sats 30s.     Recommendations/Plan:  Anxiety: Recommend low dose Xanax 0.25 mg TID prn.   No pain.   Will need low dose opioids with continued respiratory decline although h/o poor tolerance of  morphine. Could consider oxycodone vs fentanyl for relief of worsening dyspnea if  she agrees to this. Will follow for needs for now.    Code Status:    Code Status Orders        Start     Ordered   05/21/15 1203  Do not attempt resuscitation (DNR)   Continuous    Question Answer Comment  In the event of cardiac or respiratory ARREST Do not call a "code blue"   In the event of cardiac or respiratory ARREST Do not perform Intubation, CPR, defibrillation or ACLS   In the event of cardiac or respiratory ARREST Use medication by any route, position, wound care, and other measures to relive pain and suffering. May use oxygen, suction and manual treatment of airway obstruction as needed for comfort.      05/21/15 1202    Code Status History    Date Active Date Inactive Code Status Order ID Comments User Context   05/20/2015  1:01 AM 05/21/2015 12:02 PM Partial Code [id number]  Vianne Bulls, MD ED   03/31/2014  2:19 PM 04/06/2014 10:50 PM DNR [id number]  Acquanetta Chain, DO Inpatient   03/30/2014 11:18 AM 03/31/2014  2:19 PM Full Code [id number]  Karlene Einstein, MD Inpatient   03/30/2014  9:43 AM 03/30/2014 11:18 AM DNR [id number]  Raylene Miyamoto, MD Inpatient   03/13/2014 10:08 PM 03/30/2014  9:43 AM Full Code [id number]  Dellia Nims, MD Inpatient   12/23/2011  1:38 AM 12/29/2011  7:46 PM Full Code [id number]  Blain Pais, MD Inpatient    Advance Directive Documentation        Most Recent Value   Type of Advance Directive  Healthcare Power of Claudina Lick per pt]   Pre-existing out of facility DNR order (yellow form or pink MOST form)     "MOST" Form in Place?         Prognosis:   With continued respiratory decline prognosis could be weeks or less.   Discharge Planning:  Likely hospice facility.   Thank you for allowing the Palliative Medicine Team to assist in the care of this patient.   Time In: 1300 Time Out: 1320 Total Time 3min Prolonged Time Billed  no       Greater than 50%  of this time was spent counseling and coordinating care  related to the above assessment and plan.  Pershing Proud, NP  Please contact Palliative Medicine Team phone at [phone] for questions and concerns.

## 2015-05-22 NOTE — NC FL2 (Signed)
[place] LEVEL OF CARE SCREENING TOOL     IDENTIFICATION  Patient Name: [name] Birthdate: 01/12/1946 Sex: female Admission Date (Current Location): 05/19/2015  [facility] and [place] Number:  Herbalist and Address:  [facility] [place] [address], [place], [place] [address]      Provider Number: [id number]  Attending Physician Name and Address:  Lavina Hamman, MD  Relative Name and Phone Number:       Current Level of Care: Hospital Recommended Level of Care: [place] Prior Approval Number:    Date Approved/Denied:   PASRR Number: [id number]  Discharge Plan: SNF    Current Diagnoses: Patient Active Problem List   Diagnosis Date Noted  . Palliative care encounter   . Acute on chronic respiratory failure with hypoxia ([place]) 05/20/2015  . HCAP (healthcare-associated pneumonia) 05/19/2015  . Aortic atherosclerosis ([place]) 09/28/2014  . Physical deconditioning 05/16/2014  . Psittacosis   . Dyspnea   . ILD (interstitial lung disease) ([place])   . Pulmonary infiltrates   . Respiratory failure ([place])   . Absolute anemia   . Vitamin D deficiency 03/22/2014  . Pneumonitis   . 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 CAP (community acquired pneumonia) 03/13/2014  . Acute respiratory failure with hypoxia ([place]) 03/13/2014  . Anemia, iron deficiency   . Asymptomatic cholelithiasis 07/15/2013  . Chronic cough 01/23/2013  . Osteoporosis 11/29/2012  . Abnormality of gait 04/26/2012  . Hyperlipidemia LDL goal <130 01/30/2012  . Anxiety 01/30/2012  . Arthritis of left knee 01/30/2012  . Chronic venous insufficiency 01/30/2012  . Fibromyalgia 01/30/2012  . Gastroesophageal reflux disease 01/30/2012  . Preventative health care 01/30/2012  . 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 Morbid obesity with BMI of 50.0-59.9, adult ([place]) 01/02/2011  . Deep venous embolism and thrombosis of left lower  extremity ([place]) 11/26/2010  . Urge urinary incontinence 03/25/2008  . Tobacco abuse 11/05/2005  . Major depression, recurrent ([place]) 11/05/2005  . Chronic low back pain 11/05/2005  . Obstructive sleep apnea 11/05/2005    Orientation RESPIRATION BLADDER Height & Weight     Self, Time, Situation, Place  O2 (HFNC 15 L/min, BiPap) Incontinent Weight: (!) 305 lb 8.9 oz (138.6 kg) Height:  5\' 7"  (170.2 cm)  BEHAVIORAL SYMPTOMS/MOOD NEUROLOGICAL BOWEL NUTRITION STATUS   (None)  (None) Incontinent Diet (Heart Healthy)  AMBULATORY STATUS COMMUNICATION OF NEEDS Skin   Extensive Assist Verbally Normal                       Personal Care Assistance Level of Assistance  Bathing, Feeding, Dressing Bathing Assistance: Limited assistance Feeding assistance: Independent Dressing Assistance: Limited assistance     Functional Limitations Info  Sight, Hearing, Speech Sight Info: Adequate Hearing Info: Adequate Speech Info: Adequate    SPECIAL CARE FACTORS FREQUENCY                       Contractures Contractures Info: Not present    Additional Factors Info  Code Status, Allergies Code Status Info: DNR Allergies Info: Adhesive, Codeine, Morphine, Sulfonamide Derivatives, Thorazine           Current Medications (05/22/2015):  This is the current hospital active medication list Current Facility-Administered Medications  Medication Dose Route Frequency Provider Last Rate Last Dose  . 0.9 %  sodium chloride infusion  250 mL Intravenous PRN Vianne Bulls, MD      .  ALPRAZolam Duanne Moron) tablet 0.25 mg  0.25 mg Oral TID PRN Pershing Proud, NP      . antiseptic oral rinse (CPC / CETYLPYRIDINIUM CHLORIDE 0.05%) solution 7 mL  7 mL Mouth Rinse q12n4p Sid Falcon, MD   7 mL at 05/21/15 1600  . ceFEPIme (MAXIPIME) 2 g in dextrose 5 % 50 mL IVPB  2 g Intravenous Q8H Vianne Bulls, MD   2 g at 05/22/15 0538  . chlorhexidine (PERIDEX) 0.12 % solution 15 mL  15 mL Mouth Rinse BID Sid Falcon, MD   15 mL at 05/22/15 1000  . Chlorhexidine Gluconate Cloth 2 % PADS 6 each  6 each Topical Q0600 Sid Falcon, MD   6 each at 05/22/15 1000  . enoxaparin (LOVENOX) injection 135 mg  135 mg Subcutaneous Q12H Tyrone Apple, RPH   135 mg at 05/22/15 1054  . furosemide (LASIX) injection 20 mg  20 mg Intravenous Once Lavina Hamman, MD   20 mg at 05/21/15 1100  . insulin aspart (novoLOG) injection 0-9 Units  0-9 Units Subcutaneous TID WC Lavina Hamman, MD   2 Units at 05/22/15 [phone]  . ipratropium-albuterol (DUONEB) 0.5-2.5 (3) MG/3ML nebulizer solution 3 mL  3 mL Nebulization Q2H PRN Ilene Qua Opyd, MD      . methylPREDNISolone sodium succinate (SOLU-MEDROL) 250 mg in sodium chloride 0.9 % 50 mL IVPB  250 mg Intravenous Q6H Juanito Doom, MD   250 mg at 05/22/15 1126  . multivitamin with minerals tablet 1 tablet  1 tablet Oral Daily Vianne Bulls, MD   1 tablet at 05/22/15 1054  . mupirocin ointment (BACTROBAN) 2 % 1 application  1 application Nasal BID Sid Falcon, MD   1 application at [id number] 1055  . pneumococcal 23 valent vaccine (PNU-IMMUNE) injection 0.5 mL  0.5 mL Intramuscular Tomorrow-1000 Sid Falcon, MD   0.5 mL at 05/21/15 1000  . sodium chloride flush (NS) 0.9 % injection 3 mL  3 mL Intravenous Q12H Ilene Qua Opyd, MD   3 mL at 05/22/15 1055  . sodium chloride flush (NS) 0.9 % injection 3 mL  3 mL Intravenous PRN Vianne Bulls, MD      . vancomycin (VANCOCIN) 1,250 mg in sodium chloride 0.9 % 250 mL IVPB  1,250 mg Intravenous Q12H Thuy D Dang, RPH   1,250 mg at 05/22/15 [id number]     Discharge Medications: Please see discharge summary for a list of discharge medications.  Relevant Imaging Results:  Relevant Lab Results:   Additional Information SS#: [id number]  Candie Chroman, LCSW

## 2015-05-22 NOTE — Progress Notes (Signed)
OT Cancellation Note  Patient Details Name: [name] MRN: [id number] DOB: November 28, 1945   Cancelled Treatment:    Reason Eval/Treat Not Completed: Medical issues which prohibited therapy - Pt with 02 sats 88-90% at rest.  Spoke with RN, who requested OT hold off today.  Will reattempt as medically appropriate.   Darlina Rumpf [place], OTR/L [id number]  05/22/2015, 12:32 PM

## 2015-05-22 NOTE — Progress Notes (Signed)
Chaplain visited pt per request from daytime chaplain (Bea). Pt was calm but did not seem to remember earlier chaplain visit. She stated that she gets confused and asked that a chaplain come back when her son is present. She anticipates that he will be here in the afternoons between about 3 and 5. She seems to understand that she is seriously ill and may not live much longer. In that regard she says she feels bad for her son and would like to live as long as possible, but she also said that if Jesus walked in the door any time she would be ready to go. I asked her if she would like to have a Bible (New Testament with Psalms) and she responded very affirmatively. I gave her a Bible and I prayed with her. Hopefully a daytime chaplain can visit again with her son present. (Pt and I did not talk in any detail about palliative care.)

## 2015-05-22 NOTE — Care Management Note (Signed)
Case Management Note  Patient Details  Name: [name] MRN: [id number] Date of Birth: 1945-09-01  Subjective/Objective:     Patient is from [facility] SNF, CSW referral.  Patient has COPD, was on bipap this am, now on HFNC. NCM will cont to follow for dc needs.                Action/Plan:   Expected Discharge Date:  05/23/15               Expected Discharge Plan:  Skilled Nursing Facility  In-House Referral:  Clinical Social Work  Discharge planning Services  CM Consult  Post Acute Care Choice:    Choice offered to:     DME Arranged:    DME Agency:     HH Arranged:    [place] Agency:     Status of Service:  In process, will continue to follow  Medicare Important Message Given:    Date Medicare IM Given:    Medicare IM give by:    Date Additional Medicare IM Given:    Additional Medicare Important Message give by:     If discussed at [place] of Stay Meetings, dates discussed:    Additional Comments:  Zenon Mayo, RN 05/22/2015, 2:40 PM

## 2015-05-22 NOTE — Progress Notes (Signed)
Name: [name] MRN: [id number] DOB: 1945/11/15    ADMISSION DATE:  05/19/2015 CONSULTATION DATE:  05/21/15  REFERRING MD :  Dr. Posey Pronto / [facility]   CHIEF COMPLAINT:  SOB    HISTORY OF PRESENT ILLNESS:  70 y/o F, smoker (20 pk years, quit 02/2014), SNF resident, with a PMH of GERD, gastric antral vascular ectasia, IBS, morbid obesity, arthritis, anxiety, depression, fibromyalgia, breast cancer, melanoma, OSA (**), chronic hypoxic respiratory failure on 2L O2, prior admit for respiratory failure / ? ARDS vs ILD/HP and DVT (on Eliquis) who presented to [facility] on 4/29 via EMS with reports of unresponsiveness and desaturations to 30's.    The patient was initially treated per EMS with 100% NRB, albuterol nebulization with some improvement in mental status.  On arrival to ER, she was alert / oriented and reported decreased energy for 2 days, 1 week of worsening cough & SOB.  The patient had attempted her regular inhalers at SNF without relief of symptoms.  Initial ABG 7.468 / CO2 41 / O2 63 / Bicarb 30.3, WBC 17.6, BNP 347, Hgb 10.4 and negative troponin. Initial CXR demonstrated progressive ILD with possible superimposed infection.  She was admitted per [facility] and treated with IV antibiotics, steroids, oxygen, nebulized bronchodilators and bipap.   She was previously seen by Dr. Chase Caller in 02/2014 for concerns of ILD.  A CT of the chest was performed at that time which demonstrated an "unusual appearance", definite evidence of progressive ILD, ? Chronic HP, AIP, component edema, right greater than left and apical greater than basal distribution, progressive adenopathy, worsening splenomegaly.  Unfortunately, she did not follow up regarding results.    PCCM consulted for evaluation of ILD, hypoxemic respiratory failure.  Pt denies hx of CHF / HTN.  She reports one month history of projective vomiting that occurs without warning.  Reports dry cough but no sputum production.   Pulmonary Hx:  Reports wood  burning stove as a child, smoker for 5 years up to 4-5 ppd, previously had pet dove for 1-2 years which she gave up in 2016, husband smoked as well.  She worked in a [employer] organ[employer]. No military exposures.    SUBJECTIVE:   VITAL SIGNS: Temp:  [98.1 F (36.7 C)-98.6 F (37 C)] 98.3 F (36.8 C) (05/02 0800) Pulse Rate:  [69-82] 70 (05/02 1051) Resp:  [15-28] 17 (05/02 1051) BP: (110-141)/(57-99) 120/67 mmHg (05/02 0900) SpO2:  [86 %-98 %] 98 % (05/02 1051) Weight:  [138.6 kg (305 lb 8.9 oz)] 138.6 kg (305 lb 8.9 oz) (05/02 0935)  PHYSICAL EXAMINATION: General:  Morbidly obese female in NAD Neuro:  Awake/alert, answers appropriately, speech clear, generalized weakness  HEENT:  MM pink/moist Cardiovascular:  s1s2 rrr, no m/r/g Lungs:  Mild tachypnea but no distress, lungs bilaterally essentially clear, R basilar fine crackles Abdomen:  Obese/soft, bsx4 active  Musculoskeletal:  No acute deformities  Skin:  Warm/dry, no edema   Recent Labs Lab 05/19/15 2033 05/21/15 0820  NA 140 143  K 3.8 3.9  CL 102 108  CO2 28 25  BUN 9 20  CREATININE 0.74 0.71  GLUCOSE 125* 154*    Recent Labs Lab 05/19/15 2033 05/21/15 0820 05/22/15 0922  HGB 10.4* 10.1* 10.5*  HCT 35.1* 34.0* 35.6*  WBC 17.6* 17.3* 15.0*  PLT 322 318 326   Ct Chest High Resolution  05/21/2015  CLINICAL DATA:  70 year old female inpatient with shortness of breath and hypoxia. History of interstitial lung disease. EXAM: CT CHEST WITHOUT  CONTRAST TECHNIQUE: Multidetector CT imaging of the chest was performed following the standard protocol without intravenous contrast. High resolution imaging of the lungs, as well as inspiratory and expiratory imaging, was performed. COMPARISON:  03/17/2014 chest CT.  05/21/2015 chest radiograph. FINDINGS: Mediastinum/Nodes: Stable top-normal heart size. Trace left pericardial fluid/ thickening is not definitely changed. Atherosclerotic nonaneurysmal thoracic aorta. Normal caliber pulmonary  arteries. Normal visualized thyroid. Oral contrast in the mid to lower thoracic esophagus suggests esophageal dysmotility and/ or gastroesophageal reflux. No axillary adenopathy. Moderate bilateral paratracheal, AP window and subcarinal lymphadenopathy appears stable, for example a 1.8 cm right paratracheal node (series 4/image 30), a 2.0 cm AP window node (series 4/ image 41) and a 2.0 cm subcarinal node (series 4/ image 69), all unchanged since 03/17/2014. There is a least mild bilateral hilar lymphadenopathy, not appreciably changed. Lungs/Pleura: No pneumothorax. Trace layering bilateral pleural effusions. There is extensive (nearly diffuse) ground-glass attenuation throughout both lungs with associated extensive interlobular septal thickening (crazy paving), with associated extensive peribronchovascular interstitial thickening and reticulation throughout both lungs involving the peribronchovascular, perilobular and subpleural regions. There is mild-to-moderate traction bronchiectasis throughout both lungs. There is evidence of architectural distortion in the involved lungs bilaterally. These findings have significantly progressed since 03/17/2014. Assessment for air trapping is limited by the extensive lung opacification. Subpleural cystic change in the upper lobes is not definitely changed, favor paraseptal emphysema. Upper abdomen: Calcified 13 mm gallstone in the nondistended gallbladder. Musculoskeletal: No aggressive appearing focal osseous lesions. Moderate degenerative changes in the thoracic spine. Partially visualized surgical hardware from ACDF in the lower cervical spine. Stable ruptured left breast prosthesis. Stable intact appearing right breast prostheses. IMPRESSION: 1. Extensive (nearly diffuse) crazy paving throughout both lungs superimposed on findings of fibrotic interstitial lung disease characterized by mild-to-moderate traction bronchiectasis, extensive peribronchovascular interstitial  thickening and reticulation throughout the central and peripheral regions of the lungs. Findings have significantly progressed since 03/17/2014. 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 Other causes of acute crazy paving such as pulmonary edema, alveolar hemorrhage, acute interstitial pneumonia (AIP) or atypical infection cannot be excluded although are considered less likely. 2. Trace bilateral pleural effusions. 3. Moderate mediastinal and bilateral hilar lymphadenopathy is stable since 03/17/2014, most consistent with benign reactive adenopathy. 4. Cholelithiasis. Electronically Signed   By: Ilona Sorrel M.D.   On: 05/21/2015 14:23   Dg Chest Port 1 View  05/21/2015  CLINICAL DATA:  Hypoxia EXAM: PORTABLE CHEST 1 VIEW COMPARISON:  05/19/2015 FINDINGS: Diffuse interstitial and airspace disease throughout the lungs bilaterally, stable. Heart is mildly enlarged. No visible effusions or acute bony abnormality. IMPRESSION: Extensive interstitial and alveolar opacities throughout both lungs, stable. Electronically Signed   By: Rolm Baptise M.D.   On: 05/21/2015 09:06   SIGNIFICANT EVENTS  4/29  Admit after being found unresponsive, hypoxic with sats in 30's  STUDIES:  03/17/14  CT Chest w Contrast >> unusual appearance, definite evidence of progressive ILD, ? Chronic HP, AIP, component edema, right greater than left and apical greater than basal distribution, progressive adenopathy, worsening splenomegaly 5/1 HRCT > Extensive (nearly diffuse) crazy paving throughout both lungs superimposed on findings of fibrotic interstitial lung disease characterized by mild-to-moderate traction bronchiectasis, extensive peribronchovascular interstitial thickening and reticulation throughout the central and peripheral regions of the lungs. Findings have significantly progressed since 03/17/2014.   ASSESSMENT / PLAN:  Discussion: The differential diagnosis here is broad, but  this most likely represents the progression of an underlying pulmonary fibrosis of uncertain  etiology. The differential diagnosis includes fibrosing hypersensitivity pneumonitis given the upper lobe predominance, versus fibrosing NSIP. Regardless, at this point she's got very severe hypoxemia and is severely limited in her functional status from this. Given the slow progression, I doubt that this will respond to treatment. Considering the typical progression of pulmonary fibrosis this is likely her baseline now. She is too sick for a biopsy for a definitive diagnosis. So at this point we need to treat to see if this is a steroid responsive process or not. I doubt that this is an acute infectious process and I do not think this is primarily volume overload.  Plan: Pulse steroids Solumedrol 250 mg q 6 x 2 more days BiPAP support PRN for increased work of breathing/hypoxemia O2 for sats > 90%, currently requiring HFNC DNR/DNI Pulmonary hygiene as tolerated - mobilize, IS  Palliative following  Goals of care: DNR reached with BQ 5/1. Discussed goals further this AM and explained positives and negatives of adding a therapy like morphine. I believe that she feels physically ready for this, but not emotionally ready. I explained to her that this decision is more than fine, and our goal remains to try to get her better.   Georgann Housekeeper, AGACNP-BC [facility] Pager [phone] or [phone]  05/22/2015 11:00 AM  Attending Note:  70 year old female with poorly understood fibrosing process presenting with hypoxemic respiratory failure.  On exam, diffuse crackles audible in all lung fields.  I reviewed CT of the chest myself as well as previous CT for comparison, worsening fibrotic changes.  Unfortunately in this situation there is little to be offered.  It is too late in the process to get a biopsy or even serology.  Discussed with PCCM-NP.  Palliative care seeing  patient.  Fibrosis:  - Pulse steroids for 2 more days.  - BiPAP PRN for comfort.  - Pulmonary hygienes.  Acute on chronic hypoxemic respiratory failure:  - Supplemental O2 as needed.  - BiPAP as needed.  GOC:  - Confirmed DNR status.  - Palliative care is following.  PCCM will follow.  Patient seen and examined, agree with above note.  I dictated the care and orders written for this patient under my direction.  Rush Farmer, MD [phone]

## 2015-05-22 NOTE — Progress Notes (Signed)
[facility] Progress Note  Patient: [name] [id number]   PCP: Sheela Stack, MD DOB: 02-Feb-1945   DOA: 05/19/2015   DOS: 05/22/2015   Date of Service: the patient was seen and examined on 05/22/2015 Outpatient Specialists: none  Subjective: No acute complaint from the patient. Pleasant this morning. No chest pain or abdominal pain. No other events overnight. Nutrition: Was nothing by mouth last night  Brief hospital course: Patient was admitted on 05/19/2015, with complaint of shortness of breath, was found to have acute on chronic hypoxic respiratory failure concerning with pneumonia as well as IPF exacerbation. Patient was started on broad-spectrum antibiotics as well as BiPAP. She was also started on Solu-Medrol. ABG showed profound hypoxia despite being on BiPAP. Palliative care was consulted as well as pulmonary. Patient was started on high-dose steroids. Not showing any significant improvement in her respiratory status. Currently patient and family are leaning more towards comfort care. We will monitor for 24 more hours on high-dose steroids. If does not improve on 05/23/2015 likely may decide for inpatient hospice.  Currently further plan is continue Solu-Medrol and monitor clinical progress.  Assessment and Plan: 1. Acute on chronic respiratory failure with hypoxia (HCC) Acute exacerbation of interstitial pulmonary lung disease. Suspected healthcare associated pneumonia. Patient has chronic interstitial lung disease and her current presentation is likely secondary to acute exacerbation of the same. Appreciate input from pulmonary. CT of the chest shows significant pulmonary fibrosis likely hypersensitivity pneumonitis Solu-Medrol is increased. Continue broad-spectrum antibiotics and continue following cultures. Palliative care consulted as well.  2. Suspected chronic diastolic dysfunction. Chronically elevated BNP for the patient. Echocardiogram in 2013 was  suggestive of diastolic dysfunction On admission it was elevated 347. An acute component of chronic diastolic dysfunction with CHF cannot be ruled out. He was given 20 mg of IV Lasix 1. We will monitor while the patient remains with minimal oral intake without further diuresis.  3. History of recurrent PE. Patient has been on Eliquis and has been transitioned to heparin here in the hospital. Suspicion for transition to heparin is because of the poor by mouth intake. I would transition the patient back to Lovenox at present. Pharmacy consulted.  4. Goals of care discussion. Patient has significantly poor prognosis given her profound hypoxia as well as interstitial lung disease. Appreciate input from pulmonary as well as palliative care. Currently plan is to monitor patient's progress over 24-48 hours with high-dose steroids for improvement in hypoxia. Patient's CODE STATUS is changed to DO NOT RESUSCITATE DO NOT INTUBATE.  From palliative care's note- - Focus is on comfort - Utilize comfort medications - No BiPAP **PLEASE REFRAIN FROM FURTHER CONVERSATIONS WITHOUT FAMILY PRESENCE REGARDING EOL**  5. Poor by mouth intake. Likely due to patient being on BiPAP. I would advance the diet as tolerated to cardiac diet. Monitor for evidence of aspiration.  6. Requirement for high dose steroids. Patient will be placed on sensitive sliding scale.  7. Depression and anxiety. Continue Zoloft. And Xanax. May need to resume Seroquel, patient is on high dose at home.  8. Iron deficiency anemia. Continue iron supplementation.  Pain management: Currently on Tylenol, On norco at home. Activity: physical therapy recommends SNF Bowel regimen: last BM 05/19/2015 Diet: Cardiac diet DVT Prophylaxis: on therapeutic anticoagulation.  Advance goals of care discussion: DNR/DNI.  Family Communication: no family was present at bedside, at the time of interview.  Discussed with family on  05/21/2015 Opportunity was given to ask question and all questions were answered  satisfactorily.   Disposition:  Barriers to safe discharge: Profound hypoxia  Consultants: Palliative care, pulmonary Procedures: None  Antibiotics: Anti-infectives    Start     Dose/Rate Route Frequency Ordered Stop   05/20/15 0800  vancomycin (VANCOCIN) 1,250 mg in sodium chloride 0.9 % 250 mL IVPB     1,250 mg 166.7 mL/hr over 90 Minutes Intravenous Every 12 hours 05/20/15 0132 05/27/15 2359   05/20/15 0600  ceFEPIme (MAXIPIME) 2 g in dextrose 5 % 50 mL IVPB     2 g 100 mL/hr over 30 Minutes Intravenous Every 8 hours 05/20/15 0101 05/28/15 0559   05/19/15 2230  vancomycin (VANCOCIN) IVPB 1000 mg/200 mL premix     1,000 mg 200 mL/hr over 60 Minutes Intravenous  Once 05/19/15 2216 05/20/15 0013   05/19/15 2230  piperacillin-tazobactam (ZOSYN) IVPB 3.375 g     3.375 g 100 mL/hr over 30 Minutes Intravenous  Once 05/19/15 2216 05/19/15 2310        Intake/Output Summary (Last 24 hours) at 05/22/15 1315 Last data filed at 05/21/15 1543  Gross per 24 hour  Intake     50 ml  Output    726 ml  Net   -676 ml   Filed Weights   05/20/15 0118 05/22/15 0935  Weight: 137.8 kg (303 lb 12.7 oz) 138.6 kg (305 lb 8.9 oz)    Objective: Physical Exam: Filed Vitals:   05/22/15 0900 05/22/15 0935 05/22/15 1051 05/22/15 1303  BP: 120/67     Pulse: 69  70 72  Temp:      TempSrc:      Resp: 24  17 21   Height:      Weight:  138.6 kg (305 lb 8.9 oz)    SpO2: 91%  98% 97%    General: Appear in moderate distress, no Rash; Oral Mucosa moist. Cardiovascular: S1 and S2 Present, no Murmur, difficult to assess JVD Respiratory: Bilateral Air entry present and bilateral  Crackles,  wheezes Abdomen: Bowel Sound present, Soft and no tenderness Extremities: no Pedal edema, no calf tenderness  Data Reviewed: CBC:  Recent Labs Lab 05/19/15 2033 05/21/15 0820 05/22/15 0922  WBC 17.6* 17.3* 15.0*  NEUTROABS  --   15.7* 13.8*  HGB 10.4* 10.1* 10.5*  HCT 35.1* 34.0* 35.6*  MCV 85.6 85.4 85.4  PLT 322 318 [id number]   Basic Metabolic Panel:  Recent Labs Lab 05/19/15 2033 05/21/15 0820 05/22/15 0922  NA 140 143 141  K 3.8 3.9 3.8  CL 102 108 105  CO2 28 25 28   GLUCOSE 125* 154* 171*  BUN 9 20 21*  CREATININE 0.74 0.71 0.72  CALCIUM 9.0 8.7* 8.4*  MG  --  2.3  --     Liver Function Tests:  Recent Labs Lab 05/21/15 0820 05/22/15 0922  AST 16 16  ALT 11* 12*  ALKPHOS 63 70  BILITOT 0.7 0.8  PROT 6.1* 5.7*  ALBUMIN 2.9* 2.8*   No results for input(s): LIPASE, AMYLASE in the last 168 hours. No results for input(s): AMMONIA in the last 168 hours. Coagulation Profile: No results for input(s): INR, PROTIME in the last 168 hours. Cardiac Enzymes: No results for input(s): CKTOTAL, CKMB, CKMBINDEX, TROPONINI in the last 168 hours. BNP (last 3 results) No results for input(s): PROBNP in the last 8760 hours.  CBG:  Recent Labs Lab 05/21/15 2107 05/22/15 0822 05/22/15 1154  GLUCAP 149* 169* 141*    Studies: Ct Chest High Resolution  05/21/2015  CLINICAL DATA:  70 year old female inpatient with shortness of breath and hypoxia. History of interstitial lung disease. EXAM: CT CHEST WITHOUT CONTRAST TECHNIQUE: Multidetector CT imaging of the chest was performed following the standard protocol without intravenous contrast. High resolution imaging of the lungs, as well as inspiratory and expiratory imaging, was performed. COMPARISON:  03/17/2014 chest CT.  05/21/2015 chest radiograph. FINDINGS: Mediastinum/Nodes: Stable top-normal heart size. Trace left pericardial fluid/ thickening is not definitely changed. Atherosclerotic nonaneurysmal thoracic aorta. Normal caliber pulmonary arteries. Normal visualized thyroid. Oral contrast in the mid to lower thoracic esophagus suggests esophageal dysmotility and/ or gastroesophageal reflux. No axillary adenopathy. Moderate bilateral paratracheal, AP window  and subcarinal lymphadenopathy appears stable, for example a 1.8 cm right paratracheal node (series 4/image 30), a 2.0 cm AP window node (series 4/ image 41) and a 2.0 cm subcarinal node (series 4/ image 69), all unchanged since 03/17/2014. There is a least mild bilateral hilar lymphadenopathy, not appreciably changed. Lungs/Pleura: No pneumothorax. Trace layering bilateral pleural effusions. There is extensive (nearly diffuse) ground-glass attenuation throughout both lungs with associated extensive interlobular septal thickening (crazy paving), with associated extensive peribronchovascular interstitial thickening and reticulation throughout both lungs involving the peribronchovascular, perilobular and subpleural regions. There is mild-to-moderate traction bronchiectasis throughout both lungs. There is evidence of architectural distortion in the involved lungs bilaterally. These findings have significantly progressed since 03/17/2014. Assessment for air trapping is limited by the extensive lung opacification. Subpleural cystic change in the upper lobes is not definitely changed, favor paraseptal emphysema. Upper abdomen: Calcified 13 mm gallstone in the nondistended gallbladder. Musculoskeletal: No aggressive appearing focal osseous lesions. Moderate degenerative changes in the thoracic spine. Partially visualized surgical hardware from ACDF in the lower cervical spine. Stable ruptured left breast prosthesis. Stable intact appearing right breast prostheses. IMPRESSION: 1. Extensive (nearly diffuse) crazy paving throughout both lungs superimposed on findings of fibrotic interstitial lung disease characterized by mild-to-moderate traction bronchiectasis, extensive peribronchovascular interstitial thickening and reticulation throughout the central and peripheral regions of the lungs. Findings have significantly progressed since 03/17/2014. The most likely diagnosis is progressive acute on chronic hypersensitivity  pneumonitis. Other causes of acute crazy paving such as pulmonary edema, alveolar hemorrhage, acute interstitial pneumonia (AIP) or atypical infection cannot be excluded although are considered less likely. 2. Trace bilateral pleural effusions. 3. Moderate mediastinal and bilateral hilar lymphadenopathy is stable since 03/17/2014, most consistent with benign reactive adenopathy. 4. Cholelithiasis. Electronically Signed   By: Ilona Sorrel M.D.   On: 05/21/2015 14:23     Scheduled Meds: . antiseptic oral rinse  7 mL Mouth Rinse q12n4p  . ceFEPime (MAXIPIME) IV  2 g Intravenous Q8H  . chlorhexidine  15 mL Mouth Rinse BID  . Chlorhexidine Gluconate Cloth  6 each Topical Q0600  . enoxaparin (LOVENOX) injection  135 mg Subcutaneous Q12H  . insulin aspart  0-9 Units Subcutaneous TID WC  . lamoTRIgine  200 mg Oral q morning - 10a  . methylPREDNISolone (SOLU-MEDROL) injection  250 mg Intravenous Q6H  . multivitamin with minerals  1 tablet Oral Daily  . mupirocin ointment  1 application Nasal BID  . pantoprazole  40 mg Oral BID AC  . pneumococcal 23 valent vaccine  0.5 mL Intramuscular Tomorrow-1000  . pravastatin  80 mg Oral Daily  . sertraline  200 mg Oral q morning - 10a  . sodium chloride flush  3 mL Intravenous Q12H  . vancomycin  1,250 mg Intravenous Q12H   Continuous Infusions:  PRN Meds: sodium chloride, ALPRAZolam, ipratropium-albuterol, sodium chloride flush  Time spent: 30 minutes  Author: Berle Mull, MD [facility] Pager: [phone] 05/22/2015 1:15 PM  If 7PM-7AM, please contact night-coverage at [email or web address], password [facility]

## 2015-05-23 DIAGNOSIS — F331 Major depressive disorder, recurrent, moderate: Secondary | ICD-10-CM

## 2015-05-23 DIAGNOSIS — J849 Interstitial pulmonary disease, unspecified: Secondary | ICD-10-CM

## 2015-05-23 DIAGNOSIS — R0902 Hypoxemia: Secondary | ICD-10-CM

## 2015-05-23 DIAGNOSIS — D509 Iron deficiency anemia, unspecified: Secondary | ICD-10-CM

## 2015-05-23 DIAGNOSIS — F419 Anxiety disorder, unspecified: Secondary | ICD-10-CM

## 2015-05-23 DIAGNOSIS — Z66 Do not resuscitate: Secondary | ICD-10-CM

## 2015-05-23 LAB — COMPREHENSIVE METABOLIC PANEL
ALK PHOS: 61 U/L (ref 38–126)
ALT: 11 U/L — ABNORMAL LOW (ref 14–54)
ANION GAP: 8 (ref 5–15)
AST: 14 U/L — ABNORMAL LOW (ref 15–41)
Albumin: 2.7 g/dL — ABNORMAL LOW (ref 3.5–5.0)
BUN: 19 mg/dL (ref 6–20)
CALCIUM: 8.2 mg/dL — AB (ref 8.9–10.3)
CO2: 27 mmol/L (ref 22–32)
Chloride: 106 mmol/L (ref 101–111)
Creatinine, Ser: 0.61 mg/dL (ref 0.44–1.00)
GFR calc non Af Amer: >60 mL/min (ref >60)
Glucose, Bld: 181 mg/dL — ABNORMAL HIGH (ref 65–99)
Potassium: 4.1 mmol/L (ref 3.5–5.1)
SODIUM: 141 mmol/L (ref 135–145)
TOTAL PROTEIN: 5.4 g/dL — AB (ref 6.5–8.1)
Total Bilirubin: 0.6 mg/dL (ref 0.3–1.2)

## 2015-05-23 LAB — VANCOMYCIN, TROUGH: VANCOMYCIN TR: 16 ug/mL (ref 10.0–20.0)

## 2015-05-23 LAB — GLUCOSE, CAPILLARY: Glucose-Capillary: 158 mg/dL — ABNORMAL HIGH (ref 65–99)

## 2015-05-23 MED ORDER — ACETAMINOPHEN 650 MG RE SUPP: 650.0000 mg | Freq: Four times a day (QID) | RECTAL | Status: DC | PRN

## 2015-05-23 MED ORDER — ONDANSETRON HCL 4 MG/2ML IJ SOLN: 4.0000 mg | Freq: Four times a day (QID) | INTRAMUSCULAR | Status: DC | PRN

## 2015-05-23 MED ORDER — BIOTENE DRY MOUTH MT LIQD: 15.0000 mL | OROMUCOSAL | Status: DC | PRN

## 2015-05-23 MED ORDER — QUETIAPINE FUMARATE 100 MG PO TABS
400.0000 mg | ORAL_TABLET | Freq: Every day | ORAL | Status: DC
  Administered 2015-05-23 – 2015-05-27 (×5): 400 mg via ORAL
  Filled 2015-05-23 (×2): qty 4
  Filled 2015-05-23: qty 8
  Filled 2015-05-23 (×2): qty 4

## 2015-05-23 MED ORDER — VANCOMYCIN HCL 10 G IV SOLR
1250.0000 mg | Freq: Two times a day (BID) | INTRAVENOUS | Status: DC
  Administered 2015-05-23: 1250 mg via INTRAVENOUS
  Filled 2015-05-23 (×2): qty 1250

## 2015-05-23 MED ORDER — ACETAMINOPHEN 325 MG PO TABS: 650.0000 mg | ORAL_TABLET | Freq: Four times a day (QID) | ORAL | Status: DC | PRN

## 2015-05-23 MED ORDER — HALOPERIDOL LACTATE 2 MG/ML PO CONC
0.5000 mg | ORAL | Status: DC | PRN
  Filled 2015-05-23: qty 0.3

## 2015-05-23 MED ORDER — HALOPERIDOL LACTATE 5 MG/ML IJ SOLN: 0.5000 mg | INTRAMUSCULAR | Status: DC | PRN

## 2015-05-23 MED ORDER — GLYCOPYRROLATE 1 MG PO TABS
1.0000 mg | ORAL_TABLET | ORAL | Status: DC | PRN
  Filled 2015-05-23: qty 1

## 2015-05-23 MED ORDER — POLYVINYL ALCOHOL 1.4 % OP SOLN: 1.0000 [drp] | Freq: Four times a day (QID) | OPHTHALMIC | Status: DC | PRN

## 2015-05-23 MED ORDER — METHYLPREDNISOLONE SODIUM SUCC 40 MG IJ SOLR
40.0000 mg | Freq: Four times a day (QID) | INTRAMUSCULAR | Status: DC
  Administered 2015-05-23 – 2015-05-24 (×3): 40 mg via INTRAVENOUS
  Filled 2015-05-23 (×3): qty 1

## 2015-05-23 MED ORDER — HALOPERIDOL 1 MG PO TABS
0.5000 mg | ORAL_TABLET | ORAL | Status: DC | PRN
  Filled 2015-05-23: qty 1

## 2015-05-23 MED ORDER — FENTANYL CITRATE (PF) 100 MCG/2ML IJ SOLN
25.0000 ug | INTRAMUSCULAR | Status: DC | PRN
  Administered 2015-05-23 – 2015-05-24 (×3): 50 ug via INTRAVENOUS
  Filled 2015-05-23 (×3): qty 2

## 2015-05-23 MED ORDER — GLYCOPYRROLATE 0.2 MG/ML IJ SOLN: 0.2000 mg | INTRAMUSCULAR | Status: DC | PRN

## 2015-05-23 MED ORDER — LORAZEPAM 2 MG/ML IJ SOLN
1.0000 mg | INTRAMUSCULAR | Status: DC | PRN
  Administered 2015-05-25 – 2015-05-27 (×5): 2 mg via INTRAVENOUS
  Filled 2015-05-23 (×5): qty 1

## 2015-05-23 MED ORDER — ONDANSETRON 4 MG PO TBDP
4.0000 mg | ORAL_TABLET | Freq: Four times a day (QID) | ORAL | Status: DC | PRN
  Filled 2015-05-23: qty 1

## 2015-05-23 MED ORDER — GLYCOPYRROLATE 0.2 MG/ML IJ SOLN
0.2000 mg | INTRAMUSCULAR | Status: DC | PRN
  Administered 2015-05-23 – 2015-05-24 (×2): 0.2 mg via INTRAVENOUS
  Filled 2015-05-23 (×2): qty 1

## 2015-05-23 MED ORDER — LORAZEPAM 2 MG/ML PO CONC
1.0000 mg | ORAL | Status: DC | PRN
  Administered 2015-05-27 – 2015-05-28 (×2): 2 mg via ORAL
  Filled 2015-05-23 (×2): qty 1

## 2015-05-23 MED ORDER — FENTANYL CITRATE (PF) 100 MCG/2ML IJ SOLN: 25.0000 ug | INTRAMUSCULAR | Status: DC | PRN

## 2015-05-23 NOTE — Care Management Important Message (Signed)
Important Message  Patient Details  Name: [name] MRN: [id number] Date of Birth: [birth year]   Medicare Important Message Given:  Yes    Zenon Mayo, RN 05/23/2015, 3:20 [place] Message  Patient Details  Name: [name] MRN: [id number] Date of Birth: 1945-12-21   Medicare Important Message Given:  Yes    Zenon Mayo, RN 05/23/2015, 3:20 PM

## 2015-05-23 NOTE — Progress Notes (Signed)
PROGRESS NOTE    [name]  [id number] DOB: 23-Sep-1945 DOA: 05/19/2015 PCP: Sheela Stack, MD   Outpatient Specialists:     Brief Narrative:  Patient was admitted on 05/19/2015, with complaint of shortness of breath, was found to have acute on chronic hypoxic respiratory failure concerning with pneumonia as well as IPF exacerbation. Patient was started on broad-spectrum antibiotics as well as BiPAP. She was also started on Solu-Medrol. ABG showed profound hypoxia despite being on BiPAP. Palliative care was consulted as well as pulmonary. Patient was started on high-dose steroids. Not showing any significant improvement in her respiratory status. Currently patient and family are leaning more towards comfort care. We will monitor for 24 more hours on high-dose steroids. If does not improve on 05/23/2015 likely may decide for inpatient hospice.    Assessment & Plan:   Principal Problem:   Acute on chronic respiratory failure with hypoxia (HCC) Active Problems:   Major depression, recurrent (HCC)   Chronic low back pain   Obstructive sleep apnea   Anxiety   Anemia, iron deficiency   HCAP (healthcare-associated pneumonia)   Palliative care encounter   Acute on chronic respiratory failure with hypoxia (HCC) Acute exacerbation of interstitial pulmonary lung disease. Suspected healthcare associated pneumonia. Patient has chronic interstitial lung disease and her current presentation is likely secondary to acute exacerbation of the same. Appreciate input from pulmonary. 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 Chronically elevated BNP for the patient. Echocardiogram in 2013 was suggestive  of diastolic dysfunction On admission it was elevated 347. S/p 20 mg of IV Lasix 1  History of recurrent PE. Patient has been on Eliquis  -lovenox in hospital  Hyperglycemia on steroids -SSI  Depression and anxiety. Continue Zoloft. And Xanax. -resume Seroquel, patient is on high dose at home.  Iron deficiency anemia. Holding supplementations   DVT prophylaxis:  lovenox  Code Status: DNR   Family Communication: No family at bedside  Disposition Plan:  Per palliative-- hospice   Consultants:   Palliative  pulm  Procedures:        Subjective: Confused this AM-- pulled IV out No complaints of pain   Objective: Filed Vitals:   05/23/15 0005 05/23/15 0011 05/23/15 0034 05/23/15 0458  BP:      Pulse:      Temp:    97.5 F (36.4 C)  TempSrc:    Axillary  Resp:  21    Height:      Weight:      SpO2: 85% 95% 97%     Intake/Output Summary (Last 24 hours) at 05/23/15 0756 Last data filed at 05/22/15 2000  Gross per 24 hour  Intake    250 ml  Output      0 ml  Net    250 ml   Filed Weights   05/20/15 0118 05/22/15 0935  Weight: 137.8 kg (303 lb 12.7 oz) 138.6 kg (305 lb 8.9 oz)    Examination:  General exam: [name], on NRB Respiratory system: no wheezig Cardiovascular system: S1 & S2 heard, RRR. Gastrointestinal system: obese, +BS Central nervous system: pleasantly confused Skin: No rashes, lesions or ulcers    Data Reviewed: I have personally reviewed following labs and imaging studies  CBC:  Recent Labs Lab 05/19/15 2033  05/21/15 0820 05/22/15 0922  WBC 17.6* 17.3* 15.0*  NEUTROABS  --  15.7* 13.8*  HGB 10.4* 10.1* 10.5*  HCT 35.1* 34.0* 35.6*  MCV 85.6 85.4 85.4  PLT 322 318 [id number]   Basic Metabolic Panel:  Recent Labs Lab 05/19/15 2033 05/21/15 0820 05/22/15 0922 05/23/15 0435  NA 140 143 141 141  K 3.8 3.9 3.8 4.1  CL 102 108 105 106  CO2 28 25 28 27   GLUCOSE 125* 154* 171* 181*  BUN 9 20 21* 19  CREATININE  0.74 0.71 0.72 0.61  CALCIUM 9.0 8.7* 8.4* 8.2*  MG  --  2.3  --   --    GFR: Estimated Creatinine Clearance: 95.4 mL/min (by C-G formula based on Cr of 0.61). Liver Function Tests:  Recent Labs Lab 05/21/15 0820 05/22/15 0922 05/23/15 0435  AST 16 16 14*  ALT 11* 12* 11*  ALKPHOS 63 70 61  BILITOT 0.7 0.8 0.6  PROT 6.1* 5.7* 5.4*  ALBUMIN 2.9* 2.8* 2.7*   No results for input(s): LIPASE, AMYLASE in the last 168 hours. No results for input(s): AMMONIA in the last 168 hours. Coagulation Profile: No results for input(s): INR, PROTIME in the last 168 hours. Cardiac Enzymes: No results for input(s): CKTOTAL, CKMB, CKMBINDEX, TROPONINI in the last 168 hours. BNP (last 3 results) No results for input(s): PROBNP in the last 8760 hours. HbA1C: No results for input(s): HGBA1C in the last 72 hours. CBG:  Recent Labs Lab 05/21/15 2107 05/22/15 0822 05/22/15 1154 05/22/15 1621 05/22/15 2114  GLUCAP 149* 169* 141* 178* 169*   Lipid Profile: No results for input(s): CHOL, HDL, LDLCALC, TRIG, CHOLHDL, LDLDIRECT in the last 72 hours. Thyroid Function Tests: No results for input(s): TSH, T4TOTAL, FREET4, T3FREE, THYROIDAB in the last 72 hours. Anemia Panel: No results for input(s): VITAMINB12, FOLATE, FERRITIN, TIBC, IRON, RETICCTPCT in the last 72 hours. Urine analysis:    Component Value Date/Time   COLORURINE YELLOW 09/09/2012 1811   APPEARANCEUR CLOUDY* 09/09/2012 1811   LABSPEC 1.026 09/09/2012 1811   PHURINE 6.0 09/09/2012 1811   GLUCOSEU NEGATIVE 09/09/2012 1811   GLUCOSEU NEG mg/dL 09/04/2009 2008   HGBUR TRACE* 09/09/2012 1811   HGBUR trace-intact 01/25/2010 1354   BILIRUBINUR NEGATIVE 09/09/2012 1811   KETONESUR NEGATIVE 09/09/2012 1811   PROTEINUR NEGATIVE 09/09/2012 1811   UROBILINOGEN 0.2 09/09/2012 1811   NITRITE POSITIVE* 09/09/2012 1811   LEUKOCYTESUR MODERATE* 09/09/2012 1811    Recent Results (from the past 240 hour(s))  MRSA PCR Screening      Status: Abnormal   Collection Time: 05/20/15  6:22 AM  Result Value Ref Range Status   MRSA by PCR POSITIVE (A) NEGATIVE Final    Comment:        The GeneXpert MRSA Assay (FDA approved for NASAL specimens only), is one component of a comprehensive MRSA colonization surveillance program. It is not intended to diagnose MRSA infection nor to guide or monitor treatment for MRSA infections. RESULT CALLED TO, READ BACK BY AND VERIFIED WITH: Nicolette Bang AT [id number] 05/20/15 BY L BENFIELD       Anti-infectives    Start     Dose/Rate Route Frequency Ordered Stop   05/20/15 0800  vancomycin (VANCOCIN) 1,250 mg in sodium chloride 0.9 % 250 mL IVPB     1,250 mg 166.7 mL/hr over 90 Minutes Intravenous Every 12 hours 05/20/15 0132 05/27/15 2359   05/20/15 0600  ceFEPIme (MAXIPIME) 2 g in dextrose 5 % 50 mL IVPB  2 g 100 mL/hr over 30 Minutes Intravenous Every 8 hours 05/20/15 0101 05/28/15 0559   05/19/15 2230  vancomycin (VANCOCIN) IVPB 1000 mg/200 mL premix     1,000 mg 200 mL/hr over 60 Minutes Intravenous  Once 05/19/15 2216 05/20/15 0013   05/19/15 2230  piperacillin-tazobactam (ZOSYN) IVPB 3.375 g     3.375 g 100 mL/hr over 30 Minutes Intravenous  Once 05/19/15 2216 05/19/15 2310       Radiology Studies: Ct Chest High Resolution  05/21/2015  CLINICAL DATA:  70 year old female inpatient with shortness of breath and hypoxia. History of interstitial lung disease. EXAM: CT CHEST WITHOUT CONTRAST TECHNIQUE: Multidetector CT imaging of the chest was performed following the standard protocol without intravenous contrast. High resolution imaging of the lungs, as well as inspiratory and expiratory imaging, was performed. COMPARISON:  03/17/2014 chest CT.  05/21/2015 chest radiograph. FINDINGS: Mediastinum/Nodes: Stable top-normal heart size. Trace left pericardial fluid/ thickening is not definitely changed. Atherosclerotic nonaneurysmal thoracic aorta. Normal caliber pulmonary arteries. Normal  visualized thyroid. Oral contrast in the mid to lower thoracic esophagus suggests esophageal dysmotility and/ or gastroesophageal reflux. No axillary adenopathy. Moderate bilateral paratracheal, AP window and subcarinal lymphadenopathy appears stable, for example a 1.8 cm right paratracheal node (series 4/image 30), a 2.0 cm AP window node (series 4/ image 41) and a 2.0 cm subcarinal node (series 4/ image 69), all unchanged since 03/17/2014. There is a least mild bilateral hilar lymphadenopathy, not appreciably changed. Lungs/Pleura: No pneumothorax. Trace layering bilateral pleural effusions. There is extensive (nearly diffuse) ground-glass attenuation throughout both lungs with associated extensive interlobular septal thickening (crazy paving), with associated extensive peribronchovascular interstitial thickening and reticulation throughout both lungs involving the peribronchovascular, perilobular and subpleural regions. There is mild-to-moderate traction bronchiectasis throughout both lungs. There is evidence of architectural distortion in the involved lungs bilaterally. These findings have significantly progressed since 03/17/2014. Assessment for air trapping is limited by the extensive lung opacification. Subpleural cystic change in the upper lobes is not definitely changed, favor paraseptal emphysema. Upper abdomen: Calcified 13 mm gallstone in the nondistended gallbladder. Musculoskeletal: No aggressive appearing focal osseous lesions. Moderate degenerative changes in the thoracic spine. Partially visualized surgical hardware from ACDF in the lower cervical spine. Stable ruptured left breast prosthesis. Stable intact appearing right breast prostheses. IMPRESSION: 1. Extensive (nearly diffuse) crazy paving throughout both lungs superimposed on findings of fibrotic interstitial lung disease characterized by mild-to-moderate traction bronchiectasis, extensive peribronchovascular interstitial thickening and  reticulation throughout the central and peripheral regions of the lungs. Findings have significantly progressed since 03/17/2014. 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 Other causes of acute crazy paving such as pulmonary edema, alveolar hemorrhage, acute interstitial pneumonia (AIP) or atypical infection cannot be excluded although are considered less likely. 2. Trace bilateral pleural effusions. 3. Moderate mediastinal and bilateral hilar lymphadenopathy is stable since 03/17/2014, most consistent with benign reactive adenopathy. 4. Cholelithiasis. Electronically Signed   By: Ilona Sorrel M.D.   On: 05/21/2015 14:23   Dg Chest Port 1 View  05/21/2015  CLINICAL DATA:  Hypoxia EXAM: PORTABLE CHEST 1 VIEW COMPARISON:  05/19/2015 FINDINGS: Diffuse interstitial and airspace disease throughout the lungs bilaterally, stable. Heart is mildly enlarged. No visible effusions or acute bony abnormality. IMPRESSION: Extensive interstitial and alveolar opacities throughout both lungs, stable. Electronically Signed   By: Rolm Baptise M.D.   On: 05/21/2015 09:06        Scheduled Meds: . antiseptic oral rinse  7 mL Mouth Rinse  q12n4p  . ceFEPime (MAXIPIME) IV  2 g Intravenous Q8H  . chlorhexidine  15 mL Mouth Rinse BID  . Chlorhexidine Gluconate Cloth  6 each Topical Q0600  . enoxaparin (LOVENOX) injection  135 mg Subcutaneous Q12H  . insulin aspart  0-9 Units Subcutaneous TID WC  . lamoTRIgine  200 mg Oral q morning - 10a  . methylPREDNISolone (SOLU-MEDROL) injection  250 mg Intravenous Q6H  . multivitamin with minerals  1 tablet Oral Daily  . mupirocin ointment  1 application Nasal BID  . pantoprazole  40 mg Oral BID AC  . pneumococcal 23 valent vaccine  0.5 mL Intramuscular Tomorrow-1000  . pravastatin  80 mg Oral q1800  . sertraline  200 mg Oral q morning - 10a  . sodium chloride flush  3 mL Intravenous Q12H  . vancomycin  1,250 mg Intravenous Q12H    Continuous Infusions:    LOS: 4 days    Time spent: 35 min    [place], DO [facility] Pager [phone]  If 7PM-7AM, please contact night-coverage [email or web address] Password [email or web address] 05/23/2015, 7:56 AM

## 2015-05-23 NOTE — Progress Notes (Signed)
Daily Progress Note   Patient Name: [name]       Date: 05/23/2015 DOB: 06-Apr-1945  Age: 70 y.o. MRN#: [id number] Attending Physician: Geradine Girt, DO Primary Care Physician: Sheela Stack, MD Admit Date: 05/19/2015  Reason for Consultation/Follow-up: Establishing goals of care  Subjective: [name] is more confused today with worsening respiratory status. She is tearful and asks me if she is going to live. We discuss EOL and focus on her faith for strength and comfort. She is greatly concerned about her son. Still remains very confused in conversation and doesn't always remember what is going on or where she is.   I met with [name] and [name] today who express understanding and agree to transition to full comfort care. They do not wish for her to suffer anymore. They express happiness that she lived longer than expected after being hospitalized a little over a year ago and that they were able to take her out on trips from SNF even. They discuss the need to tell [name] that they will be okay and that they will take care of each other. Emotional support provided.   Full comfort care   Length of Stay: 4  Current Medications: Scheduled Meds:  . antiseptic oral rinse  7 mL Mouth Rinse q12n4p  . ceFEPime (MAXIPIME) IV  2 g Intravenous Q8H  . chlorhexidine  15 mL Mouth Rinse BID  . Chlorhexidine Gluconate Cloth  6 each Topical Q0600  . enoxaparin (LOVENOX) injection  135 mg Subcutaneous Q12H  . insulin aspart  0-9 Units Subcutaneous TID WC  . lamoTRIgine  200 mg Oral q morning - 10a  . methylPREDNISolone (SOLU-MEDROL) injection  250 mg Intravenous Q6H  . multivitamin with minerals  1 tablet Oral Daily  . mupirocin ointment  1 application Nasal BID  . pantoprazole  40 mg Oral BID AC   . pneumococcal 23 valent vaccine  0.5 mL Intramuscular Tomorrow-1000  . pravastatin  80 mg Oral q1800  . QUEtiapine  400 mg Oral QHS  . sertraline  200 mg Oral q morning - 10a  . sodium chloride flush  3 mL Intravenous Q12H  . vancomycin  1,250 mg Intravenous Q12H    Continuous Infusions:    PRN Meds: sodium chloride, ALPRAZolam, ipratropium-albuterol, sodium chloride flush  Physical Exam  Constitutional: She  appears well-developed and well-nourished. She appears lethargic.  Cardiovascular: Normal rate.   Pulmonary/Chest: No accessory muscle usage. Tachypnea noted.  Mild-mod distress. Nonrebreather.   Abdominal: Normal appearance.  Obese   Neurological: She appears lethargic. She is disoriented.  Psychiatric: Her mood appears anxious.  Tearful             Vital Signs: BP 127/52 mmHg  Pulse 76  Temp(Src) 97.5 F (36.4 C) (Axillary)  Resp 21  Ht _0  (1.702 m)  Wt 138.6 kg (305 lb 8.9 oz)  BMI 47.85 kg/m2  SpO2 97% SpO2: SpO2: 97 % O2 Device: O2 Device: NRB (Read palliative care notes; Bipap discontinued per order.) O2 Flow Rate: O2 Flow Rate (L/min): 15 L/min  Intake/output summary:   Intake/Output Summary (Last 24 hours) at 05/23/15 1035 Last data filed at 05/22/15 2000  Gross per 24 hour  Intake    250 ml  Output      0 ml  Net    250 ml   LBM: Last BM Date: 05/19/15 Baseline Weight: Weight: (!) 137.8 kg (303 lb 12.7 oz) Most recent weight: Weight: (!) 138.6 kg (305 lb 8.9 oz)       Palliative Assessment/Data:    Flowsheet Rows        Most Recent Value   Intake Tab    Referral Department  Hospitalist   Unit at Time of Referral  ER   Palliative Care Primary Diagnosis  Pulmonary   Date Notified  05/20/15   Palliative Care Type  Return patient Palliative Care   Reason for referral  Clarify Goals of Care   Date of Admission  05/19/15   Date first seen by Palliative Care  05/21/15   # of days Palliative referral response time  1 Day(s)   # of days  IP prior to Palliative referral  1   Clinical Assessment    Psychosocial & Spiritual Assessment    Palliative Care Outcomes       Patient Active Problem List   Diagnosis Date Noted  . DNR (do not resuscitate) 05/23/2015  . Palliative care encounter   . Acute on chronic respiratory failure with hypoxia ([place]) 05/20/2015  . HCAP (healthcare-associated pneumonia) 05/19/2015  . Aortic atherosclerosis ([place]) 09/28/2014  . Physical deconditioning 05/16/2014  . Psittacosis   . Dyspnea   . ILD (interstitial lung disease) ([place])   . Pulmonary infiltrates   . Respiratory failure ([place])   . Absolute anemia   . Vitamin D deficiency 03/22/2014  . Pneumonitis   . 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 CAP (community acquired pneumonia) 03/13/2014  . Acute respiratory failure with hypoxia ([place]) 03/13/2014  . Anemia, iron deficiency   . Asymptomatic cholelithiasis 07/15/2013  . Chronic cough 01/23/2013  . Osteoporosis 11/29/2012  . Abnormality of gait 04/26/2012  . Hyperlipidemia LDL goal <130 01/30/2012  . Anxiety 01/30/2012  . Arthritis of left knee 01/30/2012  . Chronic venous insufficiency 01/30/2012  . Fibromyalgia 01/30/2012  . Gastroesophageal reflux disease 01/30/2012  . Preventative health care 01/30/2012  . 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 Morbid obesity with BMI of 50.0-59.9, adult ([place]) 01/02/2011  . Deep venous embolism and thrombosis of left lower extremity ([place]) 11/26/2010  . Urge urinary incontinence 03/25/2008  . Tobacco abuse 11/05/2005  . Major depression, recurrent ([place]) 11/05/2005  . Chronic low back pain 11/05/2005  . Obstructive sleep apnea 11/05/2005    Palliative Care Assessment & Plan   Patient Profile: 70 y.o. female  with past medical history of smoker, GERD, gastric antral vascular ectasia, IBS, recurrent DVT, morbid obesity, arthritis, anxiety, depression, fibromyalgia, h/o breast cancer, melanoma, OSA, chronic hypoxic respiratory  failure on 2L O2 prior admit for respiratory failure r/t ILD? admitted on 05/19/2015 from SNF found unresponsive, sats 30s.     Recommendations/Plan:  Anxiety: Lorazepam 1-2 mg every 3 hours prn.   Pain/dyspnea: Fentanyl 25-50 mcg every hour prn. May increase or transition to infusion if needed to achieve comfort.    Code Status:    Code Status Orders        Start     Ordered   05/21/15 1203  Do not attempt resuscitation (DNR)   Continuous    Question Answer Comment  In the event of cardiac or respiratory ARREST Do not call a "code blue"   In the event of cardiac or respiratory ARREST Do not perform Intubation, CPR, defibrillation or ACLS   In the event of cardiac or respiratory ARREST Use medication by any route, position, wound care, and other measures to relive pain and suffering. May use oxygen, suction and manual treatment of airway obstruction as needed for comfort.      05/21/15 1202    Code Status History    Date Active Date Inactive Code Status Order ID Comments User Context   05/20/2015  1:01 AM 05/21/2015 12:02 PM Partial Code [id number]  Vianne Bulls, MD ED   03/31/2014  2:19 PM 04/06/2014 10:50 PM DNR [id number]  Acquanetta Chain, DO Inpatient   03/30/2014 11:18 AM 03/31/2014  2:19 PM Full Code [id number]  Karlene Einstein, MD Inpatient   03/30/2014  9:43 AM 03/30/2014 11:18 AM DNR [id number]  Raylene Miyamoto, MD Inpatient   03/13/2014 10:08 PM 03/30/2014  9:43 AM Full Code [id number]  Dellia Nims, MD Inpatient   12/23/2011  1:38 AM 12/29/2011  7:46 PM Full Code [id number]  Blain Pais, MD Inpatient    Advance Directive Documentation        Most Recent Value   Type of Advance Directive  Healthcare Power of Claudina Lick per pt]   Pre-existing out of facility DNR order (yellow form or pink MOST form)     "MOST" Form in Place?         Prognosis:   Prognosis likely hours to day.   Discharge Planning:  Likely hospital death.   Thank you for allowing  the Palliative Medicine Team to assist in the care of this patient.   Time In: 1540 Time Out: 1620 Total Time 30mn Prolonged Time Billed  no       Greater than 50%  of this time was spent counseling and coordinating care related to the above assessment and plan.  PPershing Proud NP  Please contact Palliative Medicine Team phone at [phone] questions and concerns.

## 2015-05-23 NOTE — Clinical Social Work Note (Signed)
CSW and RNCM met with patient's daughter. Patient will not be returning to [facility].  CSW encouraged patient's daughter to contact CSW as needed.  Dayton Scrape, [place]

## 2015-05-23 NOTE — Progress Notes (Signed)
Occupational Therapy Discharge Patient Details Name: [name] MRN: [id number] DOB: 05-Apr-1945 Today's Date: 05/23/2015 Time:  -     Patient discharged from OT services secondary to goals of care are comfort with decr anxiety. Ot to will sign off acutely..  Please see latest therapy progress note for current level of functioning and progress toward goals.    Progress and discharge plan discussed with patient and/or caregiver: Patient/Caregiver agrees with plan  GO     [name]   OTR/L Pager: [phone] Office: [phone] .  05/23/2015, 7:26 AM

## 2015-05-23 NOTE — Progress Notes (Signed)
Name: [name] MRN: [id number] DOB: May 18, 1945    ADMISSION DATE:  05/19/2015 CONSULTATION DATE:  05/21/15  REFERRING MD :  Dr. Posey Pronto / [facility]   CHIEF COMPLAINT:  SOB    HISTORY OF PRESENT ILLNESS:  70 y/o F, smoker (20 pk years, quit 02/2014), SNF resident, with a PMH of GERD, gastric antral vascular ectasia, IBS, morbid obesity, arthritis, anxiety, depression, fibromyalgia, breast cancer, melanoma, OSA (**), chronic hypoxic respiratory failure on 2L O2, prior admit for respiratory failure / ? ARDS vs ILD/HP and DVT (on Eliquis) who presented to [facility] on 4/29 via EMS with reports of unresponsiveness and desaturations to 30's.    The patient was initially treated per EMS with 100% NRB, albuterol nebulization with some improvement in mental status.  On arrival to ER, she was alert / oriented and reported decreased energy for 2 days, 1 week of worsening cough & SOB.  The patient had attempted her regular inhalers at SNF without relief of symptoms.  Initial ABG 7.468 / CO2 41 / O2 63 / Bicarb 30.3, WBC 17.6, BNP 347, Hgb 10.4 and negative troponin. Initial CXR demonstrated progressive ILD with possible superimposed infection.  She was admitted per IMTS and treated with IV antibiotics, steroids, oxygen, nebulized bronchodilators and bipap.   She was previously seen by Dr. Chase Caller in 02/2014 for concerns of ILD.  A CT of the chest was performed at that time which demonstrated an "unusual appearance", definite evidence of progressive ILD, ? Chronic HP, AIP, component edema, right greater than left and apical greater than basal distribution, progressive adenopathy, worsening splenomegaly.  Unfortunately, she did not follow up regarding results.    PCCM consulted for evaluation of ILD, hypoxemic respiratory failure.  Pt denies hx of CHF / HTN.  She reports one month history of projective vomiting that occurs without warning.  Reports dry cough but no sputum production.   Pulmonary Hx:  Reports wood  burning stove as a child, smoker for 5 years up to 4-5 ppd, previously had pet dove for 1-2 years which she gave up in 2016, husband smoked as well.  She worked in a [employer] organ[employer]. No military exposures.    SUBJECTIVE: RN reports pt confused but no distress  VITAL SIGNS: Temp:  [97.4 F (36.3 C)-98.7 F (37.1 C)] 97.4 F (36.3 C) (05/03 0800) Pulse Rate:  [72-76] 76 (05/02 2000) Resp:  [17-25] 21 (05/03 0011) BP: (127)/(52) 127/52 mmHg (05/02 2000) SpO2:  [80 %-97 %] 97 % (05/03 0034)  PHYSICAL EXAMINATION: General:  Morbidly obese female in NAD Neuro:  Confusion, clear speech, generalized weakness  HEENT:  MM pink/moist Cardiovascular:  s1s2 rrr, no m/r/g Lungs:  Mild tachypnea but no distress, lungs bilaterally essentially clear Abdomen:  Obese/soft, bsx4 active  Musculoskeletal:  No acute deformities  Skin:  Warm/dry, no edema   Recent Labs Lab 05/21/15 0820 05/22/15 0922 05/23/15 0435  NA 143 141 141  K 3.9 3.8 4.1  CL 108 105 106  CO2 25 28 27   BUN 20 21* 19  CREATININE 0.71 0.72 0.61  GLUCOSE 154* 171* 181*    Recent Labs Lab 05/19/15 2033 05/21/15 0820 05/22/15 0922  HGB 10.4* 10.1* 10.5*  HCT 35.1* 34.0* 35.6*  WBC 17.6* 17.3* 15.0*  PLT 322 318 326   Ct Chest High Resolution  05/21/2015  CLINICAL DATA:  70 year old female inpatient with shortness of breath and hypoxia. History of interstitial lung disease. EXAM: CT CHEST WITHOUT CONTRAST TECHNIQUE: Multidetector CT imaging of the  chest was performed following the standard protocol without intravenous contrast. High resolution imaging of the lungs, as well as inspiratory and expiratory imaging, was performed. COMPARISON:  03/17/2014 chest CT.  05/21/2015 chest radiograph. FINDINGS: Mediastinum/Nodes: Stable top-normal heart size. Trace left pericardial fluid/ thickening is not definitely changed. Atherosclerotic nonaneurysmal thoracic aorta. Normal caliber pulmonary arteries. Normal visualized thyroid. Oral  contrast in the mid to lower thoracic esophagus suggests esophageal dysmotility and/ or gastroesophageal reflux. No axillary adenopathy. Moderate bilateral paratracheal, AP window and subcarinal lymphadenopathy appears stable, for example a 1.8 cm right paratracheal node (series 4/image 30), a 2.0 cm AP window node (series 4/ image 41) and a 2.0 cm subcarinal node (series 4/ image 69), all unchanged since 03/17/2014. There is a least mild bilateral hilar lymphadenopathy, not appreciably changed. Lungs/Pleura: No pneumothorax. Trace layering bilateral pleural effusions. There is extensive (nearly diffuse) ground-glass attenuation throughout both lungs with associated extensive interlobular septal thickening (crazy paving), with associated extensive peribronchovascular interstitial thickening and reticulation throughout both lungs involving the peribronchovascular, perilobular and subpleural regions. There is mild-to-moderate traction bronchiectasis throughout both lungs. There is evidence of architectural distortion in the involved lungs bilaterally. These findings have significantly progressed since 03/17/2014. Assessment for air trapping is limited by the extensive lung opacification. Subpleural cystic change in the upper lobes is not definitely changed, favor paraseptal emphysema. Upper abdomen: Calcified 13 mm gallstone in the nondistended gallbladder. Musculoskeletal: No aggressive appearing focal osseous lesions. Moderate degenerative changes in the thoracic spine. Partially visualized surgical hardware from ACDF in the lower cervical spine. Stable ruptured left breast prosthesis. Stable intact appearing right breast prostheses. IMPRESSION: 1. Extensive (nearly diffuse) crazy paving throughout both lungs superimposed on findings of fibrotic interstitial lung disease characterized by mild-to-moderate traction bronchiectasis, extensive peribronchovascular interstitial thickening and reticulation throughout the  central and peripheral regions of the lungs. Findings have significantly progressed since 03/17/2014. 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 Other causes of acute crazy paving such as pulmonary edema, alveolar hemorrhage, acute interstitial pneumonia (AIP) or atypical infection cannot be excluded although are considered less likely. 2. Trace bilateral pleural effusions. 3. Moderate mediastinal and bilateral hilar lymphadenopathy is stable since 03/17/2014, most consistent with benign reactive adenopathy. 4. Cholelithiasis. Electronically Signed   By: Ilona Sorrel M.D.   On: 05/21/2015 14:23   SIGNIFICANT EVENTS  4/29  Admit after being found unresponsive, hypoxic with sats in 30's  STUDIES:  03/17/14  CT Chest w Contrast >> unusual appearance, definite evidence of progressive ILD, ? Chronic HP, AIP, component edema, right greater than left and apical greater than basal distribution, progressive adenopathy, worsening splenomegaly 5/1 HRCT >> Extensive (nearly diffuse) crazy paving throughout both lungs superimposed on findings of fibrotic interstitial lung disease characterized by mild-to-moderate traction bronchiectasis, extensive peribronchovascular interstitial thickening and reticulation throughout the central and peripheral regions of the lungs. Findings have significantly progressed since 03/17/2014.   ASSESSMENT / PLAN:  Discussion: The differential diagnosis here is broad, but this most likely represents the progression of an underlying pulmonary fibrosis of uncertain etiology. The differential diagnosis includes fibrosing hypersensitivity pneumonitis given the upper lobe predominance, versus fibrosing NSIP. Regardless, at this point she's got very severe hypoxemia and is severely limited in her functional status from this. Given the slow progression, doubt that this will respond to treatment. Considering the typical progression of pulmonary fibrosis  this is likely her baseline now. She is too sick for a biopsy for a definitive diagnosis. So at this point we  need to treat to see if this is a steroid responsive process or not. I doubt that this is an acute infectious process and I do not think this is primarily volume overload.  Plan: Pulse steroids Solumedrol 250 mg q 6 x 1 more day, then reduce to 40 mg Q6 with slow reduction  BiPAP support PRN for increased work of breathing/hypoxemia O2 for sats > 90%, currently requiring HFNC DNR/DNI Pulmonary hygiene as tolerated - mobilize, IS  Palliative Care following  Goals of care: DNR reached 5/1 > Patient is not ready at this point for morphine but would be aaccepting as symptoms increase.    Noe Gens, NP-C [facility] Pgr: [phone] or if no answer [phone] 05/23/2015, 11:44 AM  Attending Note:  70 year old female with poorly understood fibrosing process presenting with hypoxemic respiratory failure. On exam, diffuse crackles audible in all lung fields. I reviewed CT of the chest myself as well as previous CT for comparison, worsening fibrotic changes. Unfortunately in this situation there is little to be offered. It is too late in the process to get a biopsy or even serology. Discussed with PCCM-NP. Palliative care seeing patient.  Fibrosis: - Pulse steroids for 1 more days then change to 40 mg IV q6 starting tomorrow. - D/C BiPAP. - Pulmonary hygienes.  Acute on chronic hypoxemic respiratory failure: - Supplemental O2 as needed. - D/C BiPAP  GOC: - Confirmed DNR status. - Palliative care is following.  PCCM will follow.  Patient seen and examined, agree with above note. I dictated the care and orders written for this patient under my direction.  Rush Farmer, MD [phone]

## 2015-05-23 NOTE — Progress Notes (Addendum)
Pharmacy Antibiotic Note  [name] is a 70 y.o. female admitted on 05/19/2015 with pneumonia.  Pharmacy has been consulted for Vancomycin dosing. WBC elevated, renal function age appropriate, pt from nursing facility.  Now day #5 of Abx for HCAP (from SNF) Afebrile, WBC improving. Seemed to have lost IV access on 5/3 and did not receive morning dose of vancomcyin. SCr stable, normalized CrCl ~69ml/min. VT today was therapeutic at 16. Family is leaning towards comfort care with decline in status but continuing abx for now. Apparently provider is talking to family this am and may stop abx today  Plan: Continue vancomycin 1,250mg  IV Q12 Continue cefepime 2g IV Q8 Monitor clinical picture, renal function, VT prn F/U C&S, abx deescalation / LOT  Consider de-escalating abx soon?  Temp (24hrs), Avg:98 F (36.7 C), Min:97.5 F (36.4 C), Max:98.7 F (37.1 C)   Recent Labs Lab 05/19/15 2033 05/21/15 0820 05/22/15 0922 05/23/15 0435 05/23/15 0820  WBC 17.6* 17.3* 15.0*  --   --   CREATININE 0.74 0.71 0.72 0.61  --   VANCOTROUGH  --   --   --   --  16    Estimated Creatinine Clearance: 95.4 mL/min (by C-G formula based on Cr of 0.61).    Allergies  Allergen Reactions  . Adhesive [Tape] Other (See Comments)    "I break out all over"; Can only use paper tape  . Codeine Other (See Comments)    "first dose I took, my aide found me the next day laying on the floor"  . Morphine Other (See Comments)    caused change in mental status, she does not want to try it again.  . Sulfonamide Derivatives Nausea And Vomiting  . Thorazine [Chlorpromazine] Nausea Only   Elenor Quinones, PharmD, [facility] Clinical Pharmacist Pager [phone] 05/23/2015 10:10 AM

## 2015-05-23 NOTE — Progress Notes (Signed)
Report given to [facility] on [address]. Pt will transfer shortly.

## 2015-05-24 MED ORDER — METHYLPREDNISOLONE SODIUM SUCC 40 MG IJ SOLR
40.0000 mg | Freq: Two times a day (BID) | INTRAMUSCULAR | Status: DC
  Administered 2015-05-24 – 2015-05-27 (×5): 40 mg via INTRAVENOUS
  Filled 2015-05-24 (×6): qty 1

## 2015-05-24 MED ORDER — FENTANYL CITRATE (PF) 100 MCG/2ML IJ SOLN
50.0000 ug | INTRAMUSCULAR | Status: DC | PRN
  Administered 2015-05-24 (×4): 50 ug via INTRAVENOUS
  Filled 2015-05-24 (×4): qty 2

## 2015-05-24 NOTE — Consult Note (Signed)
   [facility] CM Inpatient Consult   05/24/2015  [name] [birth year] [id number]   Patient screened for potential [facility] Care Management services. Chart reviewed. Noted patient is on comfort care measures. [facility] Care Management not appropriate at this time.     Marthenia Rolling, MSN-Ed, RN,BSN [facility] Liaison [phone]

## 2015-05-24 NOTE — Progress Notes (Signed)
Daily Progress Note   Patient Name: [name]       Date: 05/24/2015 DOB: August 13, 1945  Age: 70 y.o. MRN#: [id number] Attending Physician: Geradine Girt, DO Primary Care Physician: Sheela Stack, MD Admit Date: 05/19/2015  Reason for Consultation/Follow-up: Establishing goals of care  Subjective: [name] has responded very well to the fentanyl. She is resting when I enter her room but easily aroused. She is not tearful and says she "feels much better." She does not ask about dying today and I did not address this as this is the first time she is not tearful for me. RN helps me to sit her up so that she can eat lunch. Oxygen is dropping in 80s but I am guessing this is not too far from her baseline oxygen most likely. She has not declined as quickly as I first expected.   I did speak with [name] and [name] and we agree we are all pleased with her current state and symptom management. We also discussed if she remains "stable" as she is now then we should consider hospice facility. They are open to this idea but are still anticipating decline over the next 1-2 days and I agree that it would be very difficult to move her given her high underline anxiety. May continue discussing hospice facility if appropriate.   Full comfort care   Length of Stay: 5  Current Medications: Scheduled Meds:  . antiseptic oral rinse  7 mL Mouth Rinse q12n4p  . chlorhexidine  15 mL Mouth Rinse BID  . lamoTRIgine  200 mg Oral q morning - 10a  . methylPREDNISolone (SOLU-MEDROL) injection  40 mg Intravenous Q12H  . mupirocin ointment  1 application Nasal BID  . QUEtiapine  400 mg Oral QHS  . sertraline  200 mg Oral q morning - 10a    Continuous Infusions:    PRN Meds: acetaminophen **OR** acetaminophen,  antiseptic oral rinse, fentaNYL (SUBLIMAZE) injection, glycopyrrolate **OR** glycopyrrolate **OR** glycopyrrolate, haloperidol **OR** haloperidol **OR** haloperidol lactate, ipratropium-albuterol, LORazepam **OR** LORazepam, ondansetron **OR** ondansetron (ZOFRAN) IV, polyvinyl alcohol  Physical Exam  Constitutional: She appears well-developed and well-nourished. She appears lethargic.  Cardiovascular: Normal rate.   Pulmonary/Chest: No accessory muscle usage. Tachypnea noted.  Mild-mod distress. Nonrebreather.   Abdominal: Normal appearance.  Obese   Neurological: She appears lethargic. She  is disoriented.  Psychiatric: Her mood appears anxious.  Tearful             Vital Signs: BP 122/42 mmHg  Pulse 75  Temp(Src) 97.5 F (36.4 C) (Oral)  Resp 18  Ht 5\' 7"  (1.702 m)  Wt 138.6 kg (305 lb 8.9 oz)  BMI 47.85 kg/m2  SpO2 84% SpO2: SpO2: (!) 84 % O2 Device: O2 Device: High Flow Nasal Cannula O2 Flow Rate: O2 Flow Rate (L/min): 15 L/min  Intake/output summary:   Intake/Output Summary (Last 24 hours) at 05/24/15 1445 Last data filed at 05/23/15 2324  Gross per 24 hour  Intake    240 ml  Output      0 ml  Net    240 ml   LBM: Last BM Date: 05/19/15 Baseline Weight: Weight: (!) 137.8 kg (303 lb 12.7 oz) Most recent weight: Weight: (!) 138.6 kg (305 lb 8.9 oz)       Palliative Assessment/Data:    Flowsheet Rows        Most Recent Value   Intake Tab    Referral Department  Hospitalist   Unit at Time of Referral  ER   Palliative Care Primary Diagnosis  Pulmonary   Date Notified  05/20/15   Palliative Care Type  Return patient Palliative Care   Reason for referral  Clarify Goals of Care   Date of Admission  05/19/15   Date first seen by Palliative Care  05/21/15   # of days Palliative referral response time  1 Day(s)   # of days IP prior to Palliative referral  1   Clinical Assessment    Psychosocial & Spiritual Assessment    Palliative Care Outcomes       Patient  Active Problem List   Diagnosis Date Noted  . DNR (do not resuscitate) 05/23/2015  . Hypoxia   . Palliative care encounter   . Acute on chronic respiratory failure with hypoxia ([place]) 05/20/2015  . HCAP (healthcare-associated pneumonia) 05/19/2015  . Aortic atherosclerosis ([place]) 09/28/2014  . Physical deconditioning 05/16/2014  . Psittacosis   . Dyspnea   . ILD (interstitial lung disease) ([place])   . Pulmonary infiltrates   . Respiratory failure ([place])   . Absolute anemia   . Vitamin D deficiency 03/22/2014  . Pneumonitis   . 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 CAP (community acquired pneumonia) 03/13/2014  . Acute respiratory failure with hypoxia ([place]) 03/13/2014  . Anemia, iron deficiency   . Asymptomatic cholelithiasis 07/15/2013  . Chronic cough 01/23/2013  . Osteoporosis 11/29/2012  . Abnormality of gait 04/26/2012  . Hyperlipidemia LDL goal <130 01/30/2012  . Anxiety 01/30/2012  . Arthritis of left knee 01/30/2012  . Chronic venous insufficiency 01/30/2012  . Fibromyalgia 01/30/2012  . Gastroesophageal reflux disease 01/30/2012  . Preventative health care 01/30/2012  . 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 Morbid obesity with BMI of 50.0-59.9, adult ([place]) 01/02/2011  . Deep venous embolism and thrombosis of left lower extremity ([place]) 11/26/2010  . Urge urinary incontinence 03/25/2008  . Tobacco abuse 11/05/2005  . Major depression, recurrent ([place]) 11/05/2005  . Chronic low back pain 11/05/2005  . Obstructive sleep apnea 11/05/2005    Palliative Care Assessment & Plan   Patient Profile: 69 y.o. female with past medical history of smoker, GERD, gastric antral vascular ectasia, IBS, recurrent DVT, morbid obesity, arthritis, anxiety, depression, fibromyalgia, h/o breast cancer, melanoma, OSA, chronic hypoxic respiratory failure on 2L O2 prior admit for respiratory failure r/t  ILD? admitted on 05/19/2015 from SNF found unresponsive, sats 30s.      Recommendations/Plan:  Anxiety: Lorazepam 1-2 mg every 3 hours prn.   Pain/dyspnea: Fentanyl 50 mcg every hour prn. May increase or transition to infusion if needed to achieve comfort.   Secretions: Robinul prn.    Code Status:    Code Status Orders        Start     Ordered   05/21/15 1203  Do not attempt resuscitation (DNR)   Continuous    Question Answer Comment  In the event of cardiac or respiratory ARREST Do not call a "code blue"   In the event of cardiac or respiratory ARREST Do not perform Intubation, CPR, defibrillation or ACLS   In the event of cardiac or respiratory ARREST Use medication by any route, position, wound care, and other measures to relive pain and suffering. May use oxygen, suction and manual treatment of airway obstruction as needed for comfort.      05/21/15 1202    Code Status History    Date Active Date Inactive Code Status Order ID Comments User Context   05/20/2015  1:01 AM 05/21/2015 12:02 PM Partial Code [id number]  Vianne Bulls, MD ED   03/31/2014  2:19 PM 04/06/2014 10:50 PM DNR [id number]  Acquanetta Chain, DO Inpatient   03/30/2014 11:18 AM 03/31/2014  2:19 PM Full Code [id number]  Karlene Einstein, MD Inpatient   03/30/2014  9:43 AM 03/30/2014 11:18 AM DNR [id number]  Raylene Miyamoto, MD Inpatient   03/13/2014 10:08 PM 03/30/2014  9:43 AM Full Code [id number]  Dellia Nims, MD Inpatient   12/23/2011  1:38 AM 12/29/2011  7:46 PM Full Code [id number]  Blain Pais, MD Inpatient    Advance Directive Documentation        Most Recent Value   Type of Advance Directive  Healthcare Power of Claudina Lick per pt]   Pre-existing out of facility DNR order (yellow form or pink MOST form)     "MOST" Form in Place?         Prognosis:   Prognosis likely hours to day.   Discharge Planning:  Likely hospital death. Possibly hospice facility if needed.   Thank you for allowing the Palliative Medicine Team to assist in the care of this  patient.   Time In: 1345 Time Out: 1410 Total Time 40min Prolonged Time Billed  no       Greater than 50%  of this time was spent counseling and coordinating care related to the above assessment and plan.  Pershing Proud, NP  Please contact Palliative Medicine Team phone at [phone] for questions and concerns.

## 2015-05-24 NOTE — Progress Notes (Signed)
Name: [name] MRN: [id number] DOB: Nov 04, 1945    ADMISSION DATE:  05/19/2015 CONSULTATION DATE:  05/21/15  REFERRING MD :  Dr. Posey Pronto / [facility]   CHIEF COMPLAINT:  SOB    HISTORY OF PRESENT ILLNESS:  70 y/o F, smoker (20 pk years, quit 02/2014), SNF resident, with a PMH of GERD, gastric antral vascular ectasia, IBS, morbid obesity, arthritis, anxiety, depression, fibromyalgia, breast cancer, melanoma, OSA (**), chronic hypoxic respiratory failure on 2L O2, prior admit for respiratory failure / ? ARDS vs ILD/HP and DVT (on Eliquis) who presented to [facility] on 4/29 via EMS with reports of unresponsiveness and desaturations to 30's.    The patient was initially treated per EMS with 100% NRB, albuterol nebulization with some improvement in mental status.  On arrival to ER, she was alert / oriented and reported decreased energy for 2 days, 1 week of worsening cough & SOB.  The patient had attempted her regular inhalers at SNF without relief of symptoms.  Initial ABG 7.468 / CO2 41 / O2 63 / Bicarb 30.3, WBC 17.6, BNP 347, Hgb 10.4 and negative troponin. Initial CXR demonstrated progressive ILD with possible superimposed infection.  She was admitted per [facility] and treated with IV antibiotics, steroids, oxygen, nebulized bronchodilators and bipap.   She was previously seen by Dr. Chase Caller in 02/2014 for concerns of ILD.  A CT of the chest was performed at that time which demonstrated an "unusual appearance", definite evidence of progressive ILD, ? Chronic HP, AIP, component edema, right greater than left and apical greater than basal distribution, progressive adenopathy, worsening splenomegaly.  Unfortunately, she did not follow up regarding results.    PCCM consulted for evaluation of ILD, hypoxemic respiratory failure.  Pt denies hx of CHF / HTN.  She reports one month history of projective vomiting that occurs without warning.  Reports dry cough but no sputum production.   Pulmonary Hx:  Reports wood  burning stove as a child, smoker for 5 years up to 4-5 ppd, previously had pet dove for 1-2 years which she gave up in 2016, husband smoked as well.  She worked in a [employer] organ[employer]. No military exposures.    SUBJECTIVE: RN reports pt confused but no distress  VITAL SIGNS: Temp:  [97.5 F (36.4 C)-98.5 F (36.9 C)] 97.5 F (36.4 C) (05/04 0507) Pulse Rate:  [75] 75 (05/04 0507) Resp:  [18-20] 18 (05/04 0507) BP: (122-128)/(42-59) 122/42 mmHg (05/04 0507) SpO2:  [80 %-89 %] 84 % (05/04 0551)  PHYSICAL EXAMINATION: General:  Morbidly obese female in NAD Neuro:  Confusion, clear speech, generalized weakness  HEENT:  MM pink/moist Cardiovascular:  s1s2 rrr, no m/r/g Lungs:  Mild tachypnea but no distress, lungs bilaterally essentially clear Abdomen:  Obese/soft, bsx4 active  Musculoskeletal:  No acute deformities  Skin:  Warm/dry, no edema   Recent Labs Lab 05/21/15 0820 05/22/15 0922 05/23/15 0435  NA 143 141 141  K 3.9 3.8 4.1  CL 108 105 106  CO2 25 28 27   BUN 20 21* 19  CREATININE 0.71 0.72 0.61  GLUCOSE 154* 171* 181*    Recent Labs Lab 05/19/15 2033 05/21/15 0820 05/22/15 0922  HGB 10.4* 10.1* 10.5*  HCT 35.1* 34.0* 35.6*  WBC 17.6* 17.3* 15.0*  PLT 322 318 326   No results found. SIGNIFICANT EVENTS  4/29  Admit after being found unresponsive, hypoxic with sats in 30's  STUDIES:  03/17/14  CT Chest w Contrast >> unusual appearance, definite evidence of progressive ILD, ?  Chronic HP, AIP, component edema, right greater than left and apical greater than basal distribution, progressive adenopathy, worsening splenomegaly 5/1 HRCT >> Extensive (nearly diffuse) crazy paving throughout both lungs superimposed on findings of fibrotic interstitial lung disease characterized by mild-to-moderate traction bronchiectasis, extensive peribronchovascular interstitial thickening and reticulation throughout the central and peripheral regions of the lungs. Findings have significantly  progressed since 03/17/2014.   ASSESSMENT / PLAN:  Discussion: The differential diagnosis here is broad, but this most likely represents the progression of an underlying pulmonary fibrosis of uncertain etiology. The differential diagnosis includes fibrosing hypersensitivity pneumonitis given the upper lobe predominance, versus fibrosing NSIP. Regardless, at this point she's got very severe hypoxemia and is severely limited in her functional status from this. Given the slow progression, doubt that this will respond to treatment. Considering the typical progression of pulmonary fibrosis this is likely her baseline now. She is too sick for a biopsy for a definitive diagnosis. So at this point we need to treat to see if this is a steroid responsive process or not. I doubt that this is an acute infectious process and I do not think this is primarily volume overload.  Plan:  70 year old female with poorly understood fibrosing process presenting with hypoxemic respiratory failure. On exam, diffuse crackles audible in all lung fields. I reviewed CT of the chest myself as well as previous CT for comparison, worsening fibrotic changes. Unfortunately in this situation there is little to be offered. It is too late in the process to get a biopsy or even serology. Discussed with PCCM-NP. Palliative care seeing patient.  Fibrosis: - Pulse steroids complete change to 40 mg IV q6. - D/C BiPAP. - Pulmonary hygienes.  Acute on chronic hypoxemic respiratory failure: - Supplemental O2 as needed. - D/C BiPAP  GOC: - Confirmed DNR status. - Comfort care.  PCCM will sign off, please call back if needed.  Rush Farmer, M.D. [facility] Pulmonary/Critical Care Medicine. Pager: [phone]. After hours pager: [phone].

## 2015-05-24 NOTE — Progress Notes (Signed)
Nutrition Brief Note  Chart reviewed. Pt now transitioning to comfort care.  No further nutrition interventions warranted at this time.  Please re-consult as needed.   Taegan Haider A. Jahliyah Trice, RD, LDN, CDE Pager: [phone] After hours Pager: [phone]  

## 2015-05-24 NOTE — Progress Notes (Signed)
PROGRESS NOTE    [name]  [id number] DOB: 1945-12-17 DOA: 05/19/2015 PCP: Sheela Stack, MD   Outpatient Specialists:     Brief Narrative:  Patient was admitted on 05/19/2015, with complaint of shortness of breath, was found to have acute on chronic hypoxic respiratory failure concerning with pneumonia as well as IPF exacerbation. Patient was started on broad-spectrum antibiotics as well as BiPAP. She was also started on Solu-Medrol. ABG showed profound hypoxia despite being on BiPAP. Palliative care was consulted as well as pulmonary. Patient was started on high-dose steroids. Not showing any significant improvement in her respiratory status. Now comfort care    Assessment & Plan:   Principal Problem:   Acute on chronic respiratory failure with hypoxia (HCC) Active Problems:   Major depression, recurrent (HCC)   Chronic low back pain   Obstructive sleep apnea   Anxiety   Anemia, iron deficiency   HCAP (healthcare-associated pneumonia)   Palliative care encounter   DNR (do not resuscitate)   Hypoxia   Acute on chronic respiratory failure with hypoxia ([place]) Acute exacerbation of interstitial pulmonary lung disease. Suspected healthcare associated pneumonia. Patient has chronic interstitial lung disease and her current presentation is likely secondary to acute exacerbation of the same. Appreciate input from pulmonary. 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 Chronically elevated BNP for the patient. Echocardiogram in 2013 was suggestive of diastolic dysfunction On admission it was elevated 347. S/p 20 mg of IV Lasix 1  History of recurrent PE. Now comfort focus  Hyperglycemia on steroids -comfort focus  Depression and  anxiety. Comfort focus  Iron deficiency anemia. Holding supplementations   DVT prophylaxis:  lovenox  Code Status: DNR   Family Communication: Family at bedside  Disposition Plan:  Per palliative-- in hospital death?   Consultants:   Palliative  pulm  Procedures:        Subjective: Pleasantly confused -does not appear to be in distress   Objective: Filed Vitals:   05/23/15 2155 05/23/15 2300 05/24/15 0507 05/24/15 0551  BP: 128/59  122/42   Pulse:   75   Temp: 98.5 F (36.9 C)  97.5 F (36.4 C)   TempSrc: Oral  Oral   Resp: 20  18   Height:      Weight:      SpO2: 89% 89% 80% 84%    Intake/Output Summary (Last 24 hours) at 05/24/15 1141 Last data filed at 05/23/15 2324  Gross per 24 hour  Intake    240 ml  Output      0 ml  Net    240 ml   Filed Weights   05/20/15 0118 05/22/15 0935  Weight: 137.8 kg (303 lb 12.7 oz) 138.6 kg (305 lb 8.9 oz)    Examination:  General exam: [name], on NRB Respiratory system: no wheezing Cardiovascular system: rrr Gastrointestinal system: obese, +BS Central nervous system: pleasantly confused     Data Reviewed: I have personally reviewed following labs and imaging studies  CBC:  Recent Labs Lab 05/19/15 2033 05/21/15 0820 05/22/15 0922  WBC 17.6* 17.3* 15.0*  NEUTROABS  --  15.7* 13.8*  HGB 10.4* 10.1* 10.5*  HCT 35.1* 34.0* 35.6*  MCV 85.6 85.4 85.4  PLT 322 318 [id number]   Basic Metabolic Panel:  Recent Labs Lab 05/19/15 2033 05/21/15 0820  05/22/15 0922 05/23/15 0435  NA 140 143 141 141  K 3.8 3.9 3.8 4.1  CL 102 108 105 106  CO2 28 25 28 27   GLUCOSE 125* 154* 171* 181*  BUN 9 20 21* 19  CREATININE 0.74 0.71 0.72 0.61  CALCIUM 9.0 8.7* 8.4* 8.2*  MG  --  2.3  --   --    GFR: Estimated Creatinine Clearance: 95.4 mL/min (by C-G formula based on Cr of 0.61). Liver Function Tests:  Recent Labs Lab 05/21/15 0820 05/22/15 0922 05/23/15 0435  AST 16 16 14*  ALT 11* 12* 11*   ALKPHOS 63 70 61  BILITOT 0.7 0.8 0.6  PROT 6.1* 5.7* 5.4*  ALBUMIN 2.9* 2.8* 2.7*   No results for input(s): LIPASE, AMYLASE in the last 168 hours. No results for input(s): AMMONIA in the last 168 hours. Coagulation Profile: No results for input(s): INR, PROTIME in the last 168 hours. Cardiac Enzymes: No results for input(s): CKTOTAL, CKMB, CKMBINDEX, TROPONINI in the last 168 hours. BNP (last 3 results) No results for input(s): PROBNP in the last 8760 hours. HbA1C: No results for input(s): HGBA1C in the last 72 hours. CBG:  Recent Labs Lab 05/22/15 0822 05/22/15 1154 05/22/15 1621 05/22/15 2114 05/23/15 0838  GLUCAP 169* 141* 178* 169* 158*   Lipid Profile: No results for input(s): CHOL, HDL, LDLCALC, TRIG, CHOLHDL, LDLDIRECT in the last 72 hours. Thyroid Function Tests: No results for input(s): TSH, T4TOTAL, FREET4, T3FREE, THYROIDAB in the last 72 hours. Anemia Panel: No results for input(s): VITAMINB12, FOLATE, FERRITIN, TIBC, IRON, RETICCTPCT in the last 72 hours. Urine analysis:    Component Value Date/Time   COLORURINE YELLOW 09/09/2012 1811   APPEARANCEUR CLOUDY* 09/09/2012 1811   LABSPEC 1.026 09/09/2012 1811   PHURINE 6.0 09/09/2012 1811   GLUCOSEU NEGATIVE 09/09/2012 1811   GLUCOSEU NEG mg/dL 09/04/2009 2008   HGBUR TRACE* 09/09/2012 1811   HGBUR trace-intact 01/25/2010 1354   BILIRUBINUR NEGATIVE 09/09/2012 1811   KETONESUR NEGATIVE 09/09/2012 1811   PROTEINUR NEGATIVE 09/09/2012 1811   UROBILINOGEN 0.2 09/09/2012 1811   NITRITE POSITIVE* 09/09/2012 1811   LEUKOCYTESUR MODERATE* 09/09/2012 1811    Recent Results (from the past 240 hour(s))  MRSA PCR Screening     Status: Abnormal   Collection Time: 05/20/15  6:22 AM  Result Value Ref Range Status   MRSA by PCR POSITIVE (A) NEGATIVE Final    Comment:        The GeneXpert MRSA Assay (FDA approved for NASAL specimens only), is one component of a comprehensive MRSA colonization surveillance  program. It is not intended to diagnose MRSA infection nor to guide or monitor treatment for MRSA infections. RESULT CALLED TO, READ BACK BY AND VERIFIED WITH: Nicolette Bang AT [id number] 05/20/15 BY L BENFIELD       Anti-infectives    Start     Dose/Rate Route Frequency Ordered Stop   05/23/15 1200  vancomycin (VANCOCIN) 1,250 mg in sodium chloride 0.9 % 250 mL IVPB  Status:  Discontinued     1,250 mg 166.7 mL/hr over 90 Minutes Intravenous Every 12 hours 05/23/15 1013 05/23/15 1606   05/20/15 0800  vancomycin (VANCOCIN) 1,250 mg in sodium chloride 0.9 % 250 mL IVPB  Status:  Discontinued     1,250 mg 166.7 mL/hr over 90 Minutes Intravenous Every 12 hours 05/20/15 0132 05/23/15 1013   05/20/15 0600  ceFEPIme (MAXIPIME) 2 g in dextrose 5 % 50 mL IVPB  Status:  Discontinued  2 g 100 mL/hr over 30 Minutes Intravenous Every 8 hours 05/20/15 0101 05/23/15 1606   05/19/15 2230  vancomycin (VANCOCIN) IVPB 1000 mg/200 mL premix     1,000 mg 200 mL/hr over 60 Minutes Intravenous  Once 05/19/15 2216 05/20/15 0013   05/19/15 2230  piperacillin-tazobactam (ZOSYN) IVPB 3.375 g     3.375 g 100 mL/hr over 30 Minutes Intravenous  Once 05/19/15 2216 05/19/15 2310       Radiology Studies: No results found.      Scheduled Meds: . antiseptic oral rinse  7 mL Mouth Rinse q12n4p  . chlorhexidine  15 mL Mouth Rinse BID  . lamoTRIgine  200 mg Oral q morning - 10a  . methylPREDNISolone (SOLU-MEDROL) injection  40 mg Intravenous Q12H  . mupirocin ointment  1 application Nasal BID  . QUEtiapine  400 mg Oral QHS  . sertraline  200 mg Oral q morning - 10a   Continuous Infusions:    LOS: 5 days    Time spent: 25 min    [place], DO [facility] Pager [phone]  If 7PM-7AM, please contact night-coverage [email or web address] Password [email or web address] 05/24/2015, 11:41 AM

## 2015-05-25 DIAGNOSIS — Z515 Encounter for palliative care: Secondary | ICD-10-CM

## 2015-05-25 NOTE — Progress Notes (Signed)
PROGRESS NOTE    [name]  [id number] DOB: [birth year] DOA: 05/19/2015 PCP: Sheela Stack, MD   Outpatient Specialists:     Brief Narrative:  Patient was admitted on 05/19/2015, with complaint of shortness of breath, was found to have acute on chronic hypoxic respiratory failure concerning with pneumonia as well as IPF exacerbation. Patient was started on broad-spectrum antibiotics as well as BiPAP. She was also started on Solu-Medrol. ABG showed profound hypoxia despite being on BiPAP. Palliative care was consulted as well as pulmonary. Patient was started on high-dose steroids. Not showing any significant improvement in her respiratory status. Now comfort care    Assessment & Plan:   Principal Problem:   Acute on chronic respiratory failure with hypoxia (HCC) Active Problems:   Major depression, recurrent (HCC)   Chronic low back pain   Obstructive sleep apnea   Anxiety   Anemia, iron deficiency   HCAP (healthcare-associated pneumonia)   Palliative care encounter   DNR (do not resuscitate)   Hypoxia   Acute on chronic respiratory failure with hypoxia ([place]) Acute exacerbation of interstitial pulmonary lung disease. Suspected healthcare associated pneumonia. Patient has chronic interstitial lung disease and her current presentation is likely secondary to acute exacerbation of the same. Appreciate input from pulmonary. 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 Comfort care  History of recurrent PE. Now comfort focus  Hyperglycemia on steroids -comfort focus  Depression and anxiety. Comfort focus  Iron deficiency anemia. Holding supplementations   DVT prophylaxis:  lovenox  Code Status: DNR   Family Communication: Family  at bedside  Disposition Plan:  Per palliative-- in hospital death?- consider residential hospice if continues to stabilize   Consultants:   Palliative  pulm  Procedures:        Subjective: Confused but interactive   Objective: Filed Vitals:   05/24/15 0507 05/24/15 0551 05/24/15 2154 05/25/15 0522  BP: 122/42   108/49  Pulse: 75   69  Temp: 97.5 F (36.4 C)   97.9 F (36.6 C)  TempSrc: Oral   Oral  Resp: 18  16 18   Height:      Weight:      SpO2: 80% 84% 84%     Intake/Output Summary (Last 24 hours) at 05/25/15 1118 Last data filed at 05/25/15 1016  Gross per 24 hour  Intake      0 ml  Output    350 ml  Net   -350 ml   Filed Weights   05/20/15 0118 05/22/15 0935  Weight: 137.8 kg (303 lb 12.7 oz) 138.6 kg (305 lb 8.9 oz)    Examination:  General exam: [name], on [place] Respiratory system: no wheezing Cardiovascular system: rrr Gastrointestinal system: obese, +BS Central nervous system: pleasantly confused     Data Reviewed: I have personally reviewed following labs and imaging studies  CBC:  Recent Labs Lab 05/19/15 2033 05/21/15 0820 05/22/15 0922  WBC 17.6* 17.3* 15.0*  NEUTROABS  --  15.7* 13.8*  HGB 10.4* 10.1* 10.5*  HCT 35.1* 34.0* 35.6*  MCV 85.6 85.4 85.4  PLT 322 318 [id number]   Basic Metabolic Panel:  Recent Labs Lab 05/19/15 2033 05/21/15 0820 05/22/15 0922 05/23/15 0435  NA 140 143 141 141  K 3.8 3.9 3.8 4.1  CL 102 108 105 106  CO2 28  25 28 27   GLUCOSE 125* 154* 171* 181*  BUN 9 20 21* 19  CREATININE 0.74 0.71 0.72 0.61  CALCIUM 9.0 8.7* 8.4* 8.2*  MG  --  2.3  --   --    GFR: Estimated Creatinine Clearance: 95.4 mL/min (by C-G formula based on Cr of 0.61). Liver Function Tests:  Recent Labs Lab 05/21/15 0820 05/22/15 0922 05/23/15 0435  AST 16 16 14*  ALT 11* 12* 11*  ALKPHOS 63 70 61  BILITOT 0.7 0.8 0.6  PROT 6.1* 5.7* 5.4*  ALBUMIN 2.9* 2.8* 2.7*   No results for input(s): LIPASE, AMYLASE in the  last 168 hours. No results for input(s): AMMONIA in the last 168 hours. Coagulation Profile: No results for input(s): INR, PROTIME in the last 168 hours. Cardiac Enzymes: No results for input(s): CKTOTAL, CKMB, CKMBINDEX, TROPONINI in the last 168 hours. BNP (last 3 results) No results for input(s): PROBNP in the last 8760 hours. HbA1C: No results for input(s): HGBA1C in the last 72 hours. CBG:  Recent Labs Lab 05/22/15 0822 05/22/15 1154 05/22/15 1621 05/22/15 2114 05/23/15 0838  GLUCAP 169* 141* 178* 169* 158*   Lipid Profile: No results for input(s): CHOL, HDL, LDLCALC, TRIG, CHOLHDL, LDLDIRECT in the last 72 hours. Thyroid Function Tests: No results for input(s): TSH, T4TOTAL, FREET4, T3FREE, THYROIDAB in the last 72 hours. Anemia Panel: No results for input(s): VITAMINB12, FOLATE, FERRITIN, TIBC, IRON, RETICCTPCT in the last 72 hours. Urine analysis:    Component Value Date/Time   COLORURINE YELLOW 09/09/2012 1811   APPEARANCEUR CLOUDY* 09/09/2012 1811   LABSPEC 1.026 09/09/2012 1811   PHURINE 6.0 09/09/2012 1811   GLUCOSEU NEGATIVE 09/09/2012 1811   GLUCOSEU NEG mg/dL 09/04/2009 2008   HGBUR TRACE* 09/09/2012 1811   HGBUR trace-intact 01/25/2010 1354   BILIRUBINUR NEGATIVE 09/09/2012 1811   KETONESUR NEGATIVE 09/09/2012 1811   PROTEINUR NEGATIVE 09/09/2012 1811   UROBILINOGEN 0.2 09/09/2012 1811   NITRITE POSITIVE* 09/09/2012 1811   LEUKOCYTESUR MODERATE* 09/09/2012 1811    Recent Results (from the past 240 hour(s))  MRSA PCR Screening     Status: Abnormal   Collection Time: 05/20/15  6:22 AM  Result Value Ref Range Status   MRSA by PCR POSITIVE (A) NEGATIVE Final    Comment:        The GeneXpert MRSA Assay (FDA approved for NASAL specimens only), is one component of a comprehensive MRSA colonization surveillance program. It is not intended to diagnose MRSA infection nor to guide or monitor treatment for MRSA infections. RESULT CALLED TO, READ BACK  BY AND VERIFIED WITH: Nicolette Bang AT [id number] 05/20/15 BY L BENFIELD       Anti-infectives    Start     Dose/Rate Route Frequency Ordered Stop   05/23/15 1200  vancomycin (VANCOCIN) 1,250 mg in sodium chloride 0.9 % 250 mL IVPB  Status:  Discontinued     1,250 mg 166.7 mL/hr over 90 Minutes Intravenous Every 12 hours 05/23/15 1013 05/23/15 1606   05/20/15 0800  vancomycin (VANCOCIN) 1,250 mg in sodium chloride 0.9 % 250 mL IVPB  Status:  Discontinued     1,250 mg 166.7 mL/hr over 90 Minutes Intravenous Every 12 hours 05/20/15 0132 05/23/15 1013   05/20/15 0600  ceFEPIme (MAXIPIME) 2 g in dextrose 5 % 50 mL IVPB  Status:  Discontinued     2 g 100 mL/hr over 30 Minutes Intravenous Every 8 hours 05/20/15 0101 05/23/15 1606   05/19/15 2230  vancomycin (VANCOCIN) IVPB 1000  mg/200 mL premix     1,000 mg 200 mL/hr over 60 Minutes Intravenous  Once 05/19/15 2216 05/20/15 0013   05/19/15 2230  piperacillin-tazobactam (ZOSYN) IVPB 3.375 g     3.375 g 100 mL/hr over 30 Minutes Intravenous  Once 05/19/15 2216 05/19/15 2310       Radiology Studies: No results found.      Scheduled Meds: . antiseptic oral rinse  7 mL Mouth Rinse q12n4p  . chlorhexidine  15 mL Mouth Rinse BID  . lamoTRIgine  200 mg Oral q morning - 10a  . methylPREDNISolone (SOLU-MEDROL) injection  40 mg Intravenous Q12H  . QUEtiapine  400 mg Oral QHS  . sertraline  200 mg Oral q morning - 10a   Continuous Infusions:    LOS: 6 days    Time spent: 25 min    [place], DO [facility] Pager [phone]  If 7PM-7AM, please contact night-coverage [email or web address] Password [email or web address] 05/25/2015, 11:18 AM

## 2015-05-26 MED ORDER — OXYCODONE HCL 5 MG PO TABS
5.0000 mg | ORAL_TABLET | ORAL | Status: DC | PRN
  Administered 2015-05-28 (×2): 5 mg via ORAL
  Filled 2015-05-26 (×2): qty 1

## 2015-05-26 NOTE — Progress Notes (Signed)
Daily Progress Note   Patient Name: [name]       Date: 05/26/2015 DOB: Mar 01, 1945  Age: 70 y.o. MRN#: [id number] Attending Physician: Geradine Girt, DO Primary Care Physician: Sheela Stack, MD Admit Date: 05/19/2015  Reason for Consultation/Follow-up: Non pain symptom management, Pain control and Psychosocial/spiritual support  Subjective: [name] is alert, denying pain, but she does look to be short of breath. She is not wearing her oxygen presently. She is on high flow nasal cannula, and states that it is burning her nose. There is humidification on board. Patient has been very anxious since admission which is likely multifactorial related to her disease progression, hypoxemia. Did speak at length with her about the usage of opioids in addition to her oxygen therapy to treat shortness of breath. She states she has successfully used those in the past but they "make me feel drunk". Patient is taking oral medications without any difficulty at this point. She shares with me that she has used oxycodone in the past and has tolerated this well. There are no PRN doses noted overnight and she did receive only 1 Ativan dosage at bedtime  Length of Stay: 7  Current Medications: Scheduled Meds:  . antiseptic oral rinse  7 mL Mouth Rinse q12n4p  . chlorhexidine  15 mL Mouth Rinse BID  . lamoTRIgine  200 mg Oral q morning - 10a  . methylPREDNISolone (SOLU-MEDROL) injection  40 mg Intravenous Q12H  . QUEtiapine  400 mg Oral QHS  . sertraline  200 mg Oral q morning - 10a    Continuous Infusions:    PRN Meds: acetaminophen **OR** acetaminophen, antiseptic oral rinse, fentaNYL (SUBLIMAZE) injection, glycopyrrolate **OR** glycopyrrolate **OR** glycopyrrolate, haloperidol **OR** haloperidol  **OR** haloperidol lactate, ipratropium-albuterol, LORazepam **OR** LORazepam, ondansetron **OR** ondansetron (ZOFRAN) IV, oxyCODONE, polyvinyl alcohol  Physical Exam  Constitutional: She is oriented to person, place, and time. She appears well-developed and well-nourished.  HENT:  Head: Normocephalic and atraumatic.  Pulmonary/Chest:  Increased work of breathing  Abdominal: Soft.  Musculoskeletal: Normal range of motion.  Neurological: She is alert and oriented to person, place, and time.  A/O x3 but having intermittent confusion  Skin: Skin is warm and dry.  Psychiatric:  anxious  Nursing note and vitals reviewed.           Vital Signs:  BP 110/64 mmHg  Pulse 67  Temp(Src) 97.3 F (36.3 C) (Oral)  Resp 17  Ht 5\' 7"  (1.702 m)  Wt 138.6 kg (305 lb 8.9 oz)  BMI 47.85 kg/m2  SpO2 83% SpO2: SpO2: (!) 83 % O2 Device: O2 Device: High Flow Nasal Cannula O2 Flow Rate: O2 Flow Rate (L/min): 15 L/min  Intake/output summary:  Intake/Output Summary (Last 24 hours) at 05/26/15 1321 Last data filed at 05/26/15 0900  Gross per 24 hour  Intake    720 ml  Output    325 ml  Net    395 ml   LBM: Last BM Date: 05/23/15 Baseline Weight: Weight: (!) 137.8 kg (303 lb 12.7 oz) Most recent weight: Weight: (!) 138.6 kg (305 lb 8.9 oz)       Palliative Assessment/Data:    Flowsheet Rows        Most Recent Value   Intake Tab    Referral Department  Hospitalist   Unit at Time of Referral  ER   Palliative Care Primary Diagnosis  Pulmonary   Date Notified  05/20/15   Palliative Care Type  Return patient Palliative Care   Reason for referral  Clarify Goals of Care   Date of Admission  05/19/15   Date first seen by Palliative Care  05/21/15   # of days Palliative referral response time  1 Day(s)   # of days IP prior to Palliative referral  1   Clinical Assessment    Psychosocial & Spiritual Assessment    Palliative Care Outcomes       Patient Active Problem List   Diagnosis Date  Noted  . DNR (do not resuscitate) 05/23/2015  . Hypoxia   . Palliative care encounter   . Acute on chronic respiratory failure with hypoxia ([place]) 05/20/2015  . HCAP (healthcare-associated pneumonia) 05/19/2015  . Aortic atherosclerosis ([place]) 09/28/2014  . Physical deconditioning 05/16/2014  . Psittacosis   . Dyspnea   . ILD (interstitial lung disease) ([place])   . Pulmonary infiltrates   . Respiratory failure ([place])   . Absolute anemia   . Vitamin D deficiency 03/22/2014  . Pneumonitis   . 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 CAP (community acquired pneumonia) 03/13/2014  . Acute respiratory failure with hypoxia ([place]) 03/13/2014  . Anemia, iron deficiency   . Asymptomatic cholelithiasis 07/15/2013  . Chronic cough 01/23/2013  . Osteoporosis 11/29/2012  . Abnormality of gait 04/26/2012  . Hyperlipidemia LDL goal <130 01/30/2012  . Anxiety 01/30/2012  . Arthritis of left knee 01/30/2012  . Chronic venous insufficiency 01/30/2012  . Fibromyalgia 01/30/2012  . Gastroesophageal reflux disease 01/30/2012  . Preventative health care 01/30/2012  . 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 Morbid obesity with BMI of 50.0-59.9, adult ([place]) 01/02/2011  . Deep venous embolism and thrombosis of left lower extremity ([place]) 11/26/2010  . Urge urinary incontinence 03/25/2008  . Tobacco abuse 11/05/2005  . Major depression, recurrent ([place]) 11/05/2005  . Chronic low back pain 11/05/2005  . Obstructive sleep apnea 11/05/2005    Palliative Care Assessment & Plan   Patient Profile: Patient is a 70 year old female with a past medical history of tobacco usage, GERD, gastric antral vascular ectasia, IBS, recurrent DVT, arthritis, anxiety, depression, fibromyalgia, history of breast cancer, melanoma, obstructive sleep apnea, chronic hypoxic respiratory failure on O2 at home    Recommendations/Plan:  Dyspnea: We'll continue with fentanyl 50 g every hour when necessary, and will add  oxycodone 5 mg by mouth every 3  hours when necessary for mild to moderate dyspnea  Anxiety: Continue with prn Ativan every 3 hours  Goals of Care and Additional Recommendations:  Limitations on Scope of Treatment: Full Comfort Care  Code Status:    Code Status Orders        Start     Ordered   05/23/15 1608  Do not attempt resuscitation (DNR)   Continuous    Question Answer Comment  In the event of cardiac or respiratory ARREST Do not call a "code blue"   In the event of cardiac or respiratory ARREST Do not perform Intubation, CPR, defibrillation or ACLS   In the event of cardiac or respiratory ARREST Use medication by any route, position, wound care, and other measures to relive pain and suffering. May use oxygen, suction and manual treatment of airway obstruction as needed for comfort.      05/23/15 1608    Code Status History    Date Active Date Inactive Code Status Order ID Comments User Context   05/21/2015 12:02 PM 05/23/2015  4:08 PM DNR [id number]  Pershing Proud, NP Inpatient   05/20/2015  1:01 AM 05/21/2015 12:02 PM Partial Code [id number]  Vianne Bulls, MD ED   03/31/2014  2:19 PM 04/06/2014 10:50 PM DNR [id number]  Acquanetta Chain, DO Inpatient   03/30/2014 11:18 AM 03/31/2014  2:19 PM Full Code [id number]  Karlene Einstein, MD Inpatient   03/30/2014  9:43 AM 03/30/2014 11:18 AM DNR [id number]  Raylene Miyamoto, MD Inpatient   03/13/2014 10:08 PM 03/30/2014  9:43 AM Full Code [id number]  Dellia Nims, MD Inpatient   12/23/2011  1:38 AM 12/29/2011  7:46 PM Full Code [id number]  Blain Pais, MD Inpatient    Advance Directive Documentation        Most Recent Value   Type of Advance Directive  Healthcare Power of Claudina Lick per pt]   Pre-existing out of facility DNR order (yellow form or pink MOST form)     "MOST" Form in Place?         Prognosis:   < 2 weeks  Discharge Planning:  Patient may be stabilizing and inpatient hospice may be an option for her. She  becomes quite anxious when you talk to her about end-of-life issues.  Care plan was discussed with Dr. Eliseo Squires  Thank you for allowing the Palliative Medicine Team to assist in the care of this patient.   Time In: 0930 Time Out: 1000 Total Time 30 min Prolonged Time Billed  no       Greater than 50%  of this time was spent counseling and coordinating care related to the above assessment and plan.  Dory Horn, NP  Please contact Palliative Medicine Team phone at [phone] for questions and concerns.

## 2015-05-26 NOTE — Progress Notes (Signed)
PROGRESS NOTE    [name]  [id number] DOB: 1945-12-29 DOA: 05/19/2015 PCP: Jennifer Stack, MD   Outpatient Specialists:     Brief Narrative:  Patient was admitted on 05/19/2015, with complaint of shortness of breath, was found to have acute on chronic hypoxic respiratory failure concerning with pneumonia as well as IPF exacerbation. Patient was started on broad-spectrum antibiotics as well as BiPAP. She was also started on Solu-Medrol. ABG showed profound hypoxia despite being on BiPAP. Palliative care was consulted as well as pulmonary. Patient was started on high-dose steroids. Not showing any significant improvement in her respiratory status. Now comfort care    Assessment & Plan:   Principal Problem:   Acute on chronic respiratory failure with hypoxia (HCC) Active Problems:   Major depression, recurrent (HCC)   Chronic low back pain   Obstructive sleep apnea   Anxiety   Anemia, iron deficiency   HCAP (healthcare-associated pneumonia)   Palliative care encounter   DNR (do not resuscitate)   Hypoxia   Acute on chronic respiratory failure with hypoxia ([place]) Acute exacerbation of interstitial pulmonary lung disease. Suspected healthcare associated pneumonia. Patient has chronic interstitial lung disease and her current presentation is likely secondary to acute exacerbation of the same. Appreciate input from pulmonary. 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 Comfort care  History of recurrent PE. Now comfort focus  Hyperglycemia on steroids -comfort focus  Depression and anxiety. Comfort focus  Iron deficiency anemia. Holding supplementations  Place foley for end of life care   DVT prophylaxis:  lovenox  Code  Status: DNR   Family Communication: Family at bedside  Disposition Plan:  Per palliative-- in hospital death?- consider residential hospice if continues to stabilize   Consultants:   Palliative  pulm  Procedures:        Subjective: C/o dry nose and SOB when getting up to urinate   Objective: Filed Vitals:   05/24/15 2154 05/25/15 0522 05/25/15 1500 05/26/15 0520  BP:  108/49 107/58 110/64  Pulse:  69 68 67  Temp:  97.9 F (36.6 C) 97.8 F (36.6 C) 97.3 F (36.3 C)  TempSrc:  Oral Oral Oral  Resp: 16 18 16 17   Height:      Weight:      SpO2: 84%   83%    Intake/Output Summary (Last 24 hours) at 05/26/15 1208 Last data filed at 05/26/15 0900  Gross per 24 hour  Intake    720 ml  Output    325 ml  Net    395 ml   Filed Weights   05/20/15 0118 05/22/15 0935  Weight: 137.8 kg (303 lb 12.7 oz) 138.6 kg (305 lb 8.9 oz)    Examination:  General exam: [name], on [place] Respiratory system: no wheezing Cardiovascular system: rrr Gastrointestinal system: obese, +BS Central nervous system: pleasantly confused     Data Reviewed: I have personally reviewed following labs and imaging studies  CBC:  Recent Labs Lab 05/19/15 2033 05/21/15 0820 05/22/15 0922  WBC 17.6* 17.3* 15.0*  NEUTROABS  --  15.7* 13.8*  HGB 10.4* 10.1* 10.5*  HCT 35.1* 34.0* 35.6*  MCV 85.6 85.4 85.4  PLT 322 318 [id number]   Basic Metabolic Panel:  Recent Labs Lab 05/19/15 2033 05/21/15 0820 05/22/15 0922 05/23/15 0435  NA 140 143  141 141  K 3.8 3.9 3.8 4.1  CL 102 108 105 106  CO2 28 25 28 27   GLUCOSE 125* 154* 171* 181*  BUN 9 20 21* 19  CREATININE 0.74 0.71 0.72 0.61  CALCIUM 9.0 8.7* 8.4* 8.2*  MG  --  2.3  --   --    GFR: Estimated Creatinine Clearance: 95.4 mL/min (by C-G formula based on Cr of 0.61). Liver Function Tests:  Recent Labs Lab 05/21/15 0820 05/22/15 0922 05/23/15 0435  AST 16 16 14*  ALT 11* 12* 11*  ALKPHOS 63 70 61  BILITOT 0.7 0.8 0.6   PROT 6.1* 5.7* 5.4*  ALBUMIN 2.9* 2.8* 2.7*   No results for input(s): LIPASE, AMYLASE in the last 168 hours. No results for input(s): AMMONIA in the last 168 hours. Coagulation Profile: No results for input(s): INR, PROTIME in the last 168 hours. Cardiac Enzymes: No results for input(s): CKTOTAL, CKMB, CKMBINDEX, TROPONINI in the last 168 hours. BNP (last 3 results) No results for input(s): PROBNP in the last 8760 hours. HbA1C: No results for input(s): HGBA1C in the last 72 hours. CBG:  Recent Labs Lab 05/22/15 0822 05/22/15 1154 05/22/15 1621 05/22/15 2114 05/23/15 0838  GLUCAP 169* 141* 178* 169* 158*   Lipid Profile: No results for input(s): CHOL, HDL, LDLCALC, TRIG, CHOLHDL, LDLDIRECT in the last 72 hours. Thyroid Function Tests: No results for input(s): TSH, T4TOTAL, FREET4, T3FREE, THYROIDAB in the last 72 hours. Anemia Panel: No results for input(s): VITAMINB12, FOLATE, FERRITIN, TIBC, IRON, RETICCTPCT in the last 72 hours. Urine analysis:    Component Value Date/Time   COLORURINE YELLOW 09/09/2012 1811   APPEARANCEUR CLOUDY* 09/09/2012 1811   LABSPEC 1.026 09/09/2012 1811   PHURINE 6.0 09/09/2012 1811   GLUCOSEU NEGATIVE 09/09/2012 1811   GLUCOSEU NEG mg/dL 09/04/2009 2008   HGBUR TRACE* 09/09/2012 1811   HGBUR trace-intact 01/25/2010 1354   BILIRUBINUR NEGATIVE 09/09/2012 1811   KETONESUR NEGATIVE 09/09/2012 1811   PROTEINUR NEGATIVE 09/09/2012 1811   UROBILINOGEN 0.2 09/09/2012 1811   NITRITE POSITIVE* 09/09/2012 1811   LEUKOCYTESUR MODERATE* 09/09/2012 1811    Recent Results (from the past 240 hour(s))  MRSA PCR Screening     Status: Abnormal   Collection Time: 05/20/15  6:22 AM  Result Value Ref Range Status   MRSA by PCR POSITIVE (A) NEGATIVE Final    Comment:        The GeneXpert MRSA Assay (FDA approved for NASAL specimens only), is one component of a comprehensive MRSA colonization surveillance program. It is not intended to diagnose  MRSA infection nor to guide or monitor treatment for MRSA infections. RESULT CALLED TO, READ BACK BY AND VERIFIED WITH: Nicolette Bang AT [id number] 05/20/15 BY L BENFIELD       Anti-infectives    Start     Dose/Rate Route Frequency Ordered Stop   05/23/15 1200  vancomycin (VANCOCIN) 1,250 mg in sodium chloride 0.9 % 250 mL IVPB  Status:  Discontinued     1,250 mg 166.7 mL/hr over 90 Minutes Intravenous Every 12 hours 05/23/15 1013 05/23/15 1606   05/20/15 0800  vancomycin (VANCOCIN) 1,250 mg in sodium chloride 0.9 % 250 mL IVPB  Status:  Discontinued     1,250 mg 166.7 mL/hr over 90 Minutes Intravenous Every 12 hours 05/20/15 0132 05/23/15 1013   05/20/15 0600  ceFEPIme (MAXIPIME) 2 g in dextrose 5 % 50 mL IVPB  Status:  Discontinued     2 g 100 mL/hr over 30 Minutes  Intravenous Every 8 hours 05/20/15 0101 05/23/15 1606   05/19/15 2230  vancomycin (VANCOCIN) IVPB 1000 mg/200 mL premix     1,000 mg 200 mL/hr over 60 Minutes Intravenous  Once 05/19/15 2216 05/20/15 0013   05/19/15 2230  piperacillin-tazobactam (ZOSYN) IVPB 3.375 g     3.375 g 100 mL/hr over 30 Minutes Intravenous  Once 05/19/15 2216 05/19/15 2310       Radiology Studies: No results found.      Scheduled Meds: . antiseptic oral rinse  7 mL Mouth Rinse q12n4p  . chlorhexidine  15 mL Mouth Rinse BID  . lamoTRIgine  200 mg Oral q morning - 10a  . methylPREDNISolone (SOLU-MEDROL) injection  40 mg Intravenous Q12H  . QUEtiapine  400 mg Oral QHS  . sertraline  200 mg Oral q morning - 10a   Continuous Infusions:    LOS: 7 days    Time spent: 25 min    Conrad, DO [facility] Pager [phone]  If 7PM-7AM, please contact night-coverage [email or web address] Password [email or web address] 05/26/2015, 12:08 PM

## 2015-05-27 DIAGNOSIS — G8929 Other chronic pain: Secondary | ICD-10-CM

## 2015-05-27 DIAGNOSIS — M545 Low back pain: Secondary | ICD-10-CM

## 2015-05-27 NOTE — Progress Notes (Signed)
Clinical Social Work  CSW met with pt's brother and friend in conference room. Family is concerned that pt seems more confused today. CSW explained hospice choice again. Family prefers [facility] as first option. If bed is not available then they are deciding between pt returning to [place] with hospice vs [facility]. CSW gave referrals to both hospice facilities and will follow up with family to determine their final decision.  Sindy Messing, LCSW Weekend Coverage

## 2015-05-27 NOTE — Progress Notes (Addendum)
PROGRESS NOTE    [name]  [id number] DOB: Jun 11, 1945 DOA: 05/19/2015 PCP: Sheela Stack, MD   Outpatient Specialists:     Brief Narrative:  Patient was admitted on 05/19/2015, with complaint of shortness of breath, was found to have acute on chronic hypoxic respiratory failure concerning with pneumonia as well as IPF exacerbation. Patient was started on broad-spectrum antibiotics as well as BiPAP. She was also started on Solu-Medrol. ABG showed profound hypoxia despite being on BiPAP. Palliative care was consulted as well as pulmonary. Patient was started on high-dose steroids. Not showing any significant improvement in her respiratory status. Now comfort care    Assessment & Plan:   Principal Problem:   Acute on chronic respiratory failure with hypoxia (HCC) Active Problems:   Major depression, recurrent (HCC)   Chronic low back pain   Obstructive sleep apnea   Anxiety   Anemia, iron deficiency   HCAP (healthcare-associated pneumonia)   Palliative care encounter   DNR (do not resuscitate)   Hypoxia   Acute on chronic respiratory failure with hypoxia ([place]) Acute exacerbation of interstitial pulmonary lung disease. Suspected healthcare associated pneumonia. Patient has chronic interstitial lung disease and her current presentation is likely secondary to acute exacerbation of the same. Appreciate input from pulmonary. CT of the chest shows significant pulmonary fibrosis likely hypersensitivity pneumonitis Solu-Medrol- d/c IV abx- d/c Palliative care: - Focus is on comfort - Utilize comfort medications - No BiPAP  Suspected acute on chronic diastolic dysfunction. Comfort care  History of recurrent PE. Now comfort focus  Hyperglycemia on steroids -comfort focus  Depression and anxiety. Comfort focus  Iron deficiency anemia. Holding supplementations  Place foley for end of life care   DVT prophylaxis:  lovenox  Code  Status: DNR   Family Communication: Family at bedside  Disposition Plan:  Per palliative-- in hospital death?-- will explore residential hospice   Consultants:   Palliative  pulm  Procedures:        Subjective: Sleeping- no overnight events Not as interactive today   Objective: Filed Vitals:   05/25/15 1500 05/26/15 0520 05/26/15 1425 05/27/15 0633  BP: 107/58 110/64 121/60 118/51  Pulse: 68 67 75 79  Temp: 97.8 F (36.6 C) 97.3 F (36.3 C) 97.6 F (36.4 C) 98.1 F (36.7 C)  TempSrc: Oral Oral Oral Oral  Resp: 16 17 17 16   Height:      Weight:      SpO2:  83% 92% 88%    Intake/Output Summary (Last 24 hours) at 05/27/15 1242 Last data filed at 05/27/15 0635  Gross per 24 hour  Intake    600 ml  Output   1300 ml  Net   -700 ml   Filed Weights   05/20/15 0118 05/22/15 0935  Weight: 137.8 kg (303 lb 12.7 oz) 138.6 kg (305 lb 8.9 oz)    Examination:  General exam: [name], on [place] Respiratory system: no wheezing Cardiovascular system: rrr Gastrointestinal system: obese, +BS Central nervous system: sleepier than yesterday     Data Reviewed: I have personally reviewed following labs and imaging studies  CBC:  Recent Labs Lab 05/21/15 0820 05/22/15 0922  WBC 17.3* 15.0*  NEUTROABS 15.7* 13.8*  HGB 10.1* 10.5*  HCT 34.0* 35.6*  MCV 85.4 85.4  PLT 318 [id number]   Basic Metabolic Panel:  Recent Labs Lab 05/21/15 0820 05/22/15 0922 05/23/15 0435  NA 143 141 141  K 3.9 3.8 4.1  CL 108 105 106  CO2 25 28 27  GLUCOSE 154* 171* 181*  BUN 20 21* 19  CREATININE 0.71 0.72 0.61  CALCIUM 8.7* 8.4* 8.2*  MG 2.3  --   --    GFR: Estimated Creatinine Clearance: 95.4 mL/min (by C-G formula based on Cr of 0.61). Liver Function Tests:  Recent Labs Lab 05/21/15 0820 05/22/15 0922 05/23/15 0435  AST 16 16 14*  ALT 11* 12* 11*  ALKPHOS 63 70 61  BILITOT 0.7 0.8 0.6  PROT 6.1* 5.7* 5.4*  ALBUMIN 2.9* 2.8* 2.7*   No results for input(s):  LIPASE, AMYLASE in the last 168 hours. No results for input(s): AMMONIA in the last 168 hours. Coagulation Profile: No results for input(s): INR, PROTIME in the last 168 hours. Cardiac Enzymes: No results for input(s): CKTOTAL, CKMB, CKMBINDEX, TROPONINI in the last 168 hours. BNP (last 3 results) No results for input(s): PROBNP in the last 8760 hours. HbA1C: No results for input(s): HGBA1C in the last 72 hours. CBG:  Recent Labs Lab 05/22/15 0822 05/22/15 1154 05/22/15 1621 05/22/15 2114 05/23/15 0838  GLUCAP 169* 141* 178* 169* 158*   Lipid Profile: No results for input(s): CHOL, HDL, LDLCALC, TRIG, CHOLHDL, LDLDIRECT in the last 72 hours. Thyroid Function Tests: No results for input(s): TSH, T4TOTAL, FREET4, T3FREE, THYROIDAB in the last 72 hours. Anemia Panel: No results for input(s): VITAMINB12, FOLATE, FERRITIN, TIBC, IRON, RETICCTPCT in the last 72 hours. Urine analysis:    Component Value Date/Time   COLORURINE YELLOW 09/09/2012 1811   APPEARANCEUR CLOUDY* 09/09/2012 1811   LABSPEC 1.026 09/09/2012 1811   PHURINE 6.0 09/09/2012 1811   GLUCOSEU NEGATIVE 09/09/2012 1811   GLUCOSEU NEG mg/dL 09/04/2009 2008   HGBUR TRACE* 09/09/2012 1811   HGBUR trace-intact 01/25/2010 1354   BILIRUBINUR NEGATIVE 09/09/2012 1811   KETONESUR NEGATIVE 09/09/2012 1811   PROTEINUR NEGATIVE 09/09/2012 1811   UROBILINOGEN 0.2 09/09/2012 1811   NITRITE POSITIVE* 09/09/2012 1811   LEUKOCYTESUR MODERATE* 09/09/2012 1811    Recent Results (from the past 240 hour(s))  MRSA PCR Screening     Status: Abnormal   Collection Time: 05/20/15  6:22 AM  Result Value Ref Range Status   MRSA by PCR POSITIVE (A) NEGATIVE Final    Comment:        The GeneXpert MRSA Assay (FDA approved for NASAL specimens only), is one component of a comprehensive MRSA colonization surveillance program. It is not intended to diagnose MRSA infection nor to guide or monitor treatment for MRSA infections. RESULT  CALLED TO, READ BACK BY AND VERIFIED WITH: Nicolette Bang AT [id number] 05/20/15 BY L BENFIELD       Anti-infectives    Start     Dose/Rate Route Frequency Ordered Stop   05/23/15 1200  vancomycin (VANCOCIN) 1,250 mg in sodium chloride 0.9 % 250 mL IVPB  Status:  Discontinued     1,250 mg 166.7 mL/hr over 90 Minutes Intravenous Every 12 hours 05/23/15 1013 05/23/15 1606   05/20/15 0800  vancomycin (VANCOCIN) 1,250 mg in sodium chloride 0.9 % 250 mL IVPB  Status:  Discontinued     1,250 mg 166.7 mL/hr over 90 Minutes Intravenous Every 12 hours 05/20/15 0132 05/23/15 1013   05/20/15 0600  ceFEPIme (MAXIPIME) 2 g in dextrose 5 % 50 mL IVPB  Status:  Discontinued     2 g 100 mL/hr over 30 Minutes Intravenous Every 8 hours 05/20/15 0101 05/23/15 1606   05/19/15 2230  vancomycin (VANCOCIN) IVPB 1000 mg/200 mL premix     1,000 mg 200 mL/hr  over 60 Minutes Intravenous  Once 05/19/15 2216 05/20/15 0013   05/19/15 2230  piperacillin-tazobactam (ZOSYN) IVPB 3.375 g     3.375 g 100 mL/hr over 30 Minutes Intravenous  Once 05/19/15 2216 05/19/15 2310       Radiology Studies: No results found.      Scheduled Meds: . lamoTRIgine  200 mg Oral q morning - 10a  . QUEtiapine  400 mg Oral QHS  . sertraline  200 mg Oral q morning - 10a   Continuous Infusions:    LOS: 8 days    Time spent: 15 min    [place], DO [facility] Pager [phone]  If 7PM-7AM, please contact night-coverage [email or web address] Password [email or web address] 05/27/2015, 12:42 PM

## 2015-05-27 NOTE — Consult Note (Signed)
[place] Liaison:  Received request from [place] for family interest in [place]. Chart reviewed and met with patient's son and his wife in conference room to confirm interest and answer questions. They are aware clinical information is being reviewed and that a hospital liaison will follow up tomorrow regarding availability.   Thank you.  Erling Conte, [place]

## 2015-05-27 NOTE — Progress Notes (Signed)
Clinical Social Work  CSW spoke with MD who reports family has been notified and are agreeable to hospice. CSW spoke with son [name]) via phone in order to discuss DC planning. Son reports that pt has been a LT resident at [facility] for about 1 year. Son and friend [name]) are involved with pt and make decisions together. CSW explained that pt is medically stable to DC and offered residential hospice choice. Son reported that he wanted pt to wait in hospital until [facility] bed is available so that it would be convenient to visit pt. CSW explained that other options would have to be explored if [facility] does not have available beds. Son stated he would consider pt returning to [facility] with hospice following so that friends and family could continue to visit. Son thanked CSW for information and plans on talking with friend before making any final decisions. CSW will follow up with family around 1pm in hospital room.   Sindy Messing, [place] Weekend Coverage [phone]

## 2015-05-28 MED ORDER — OXYCODONE HCL 5 MG PO TABS: 5.0000 mg | ORAL_TABLET | ORAL | Status: AC | PRN

## 2015-05-28 MED ORDER — ACETAMINOPHEN 325 MG PO TABS: 650.0000 mg | ORAL_TABLET | Freq: Four times a day (QID) | ORAL | Status: AC | PRN

## 2015-05-28 MED ORDER — FENTANYL CITRATE (PF) 100 MCG/2ML IJ SOLN: 50.0000 ug | INTRAMUSCULAR | Status: AC | PRN

## 2015-05-28 MED ORDER — GLYCOPYRROLATE 1 MG PO TABS: 1.0000 mg | ORAL_TABLET | ORAL | Status: AC | PRN

## 2015-05-28 MED ORDER — LORAZEPAM 2 MG/ML PO CONC: 1.0000 mg | ORAL | Status: AC | PRN

## 2015-05-28 MED ORDER — HALOPERIDOL 0.5 MG PO TABS: 0.5000 mg | ORAL_TABLET | ORAL | Status: AC | PRN

## 2015-05-28 NOTE — Progress Notes (Signed)
2nd report called to [facility].

## 2015-05-28 NOTE — Discharge Summary (Signed)
Physician Discharge Summary  [name] [id number] DOB: 04-Sep-1945 DOA: 05/19/2015  PCP: Sheela Stack, MD  Admit date: 05/19/2015 Discharge date: 05/28/2015   Recommendations for Outpatient Follow-Up:   1. To [facility]   Discharge Diagnosis:   Principal Problem:   Acute on chronic respiratory failure with hypoxia (HCC) Active Problems:   Major depression, recurrent (HCC)   Chronic low back pain   Obstructive sleep apnea   Anxiety   Anemia, iron deficiency   HCAP (healthcare-associated pneumonia)   Palliative care encounter   DNR (do not resuscitate)   Hypoxia   Discharge disposition:  [facility]  Discharge Condition: terminal  Diet recommendation: as tolerated  Wound care: None.   History of Present Illness:   Patient was admitted on 05/19/2015, with complaint of shortness of breath, was found to have acute on chronic hypoxic respiratory failure concerning with pneumonia as well as IPF exacerbation. Patient was started on broad-spectrum antibiotics as well as BiPAP. She was also started on Solu-Medrol. ABG showed profound hypoxia despite being on BiPAP. Palliative care was consulted as well as pulmonary. Patient was started on high-dose steroids. Not showing any significant improvement in her respiratory status. 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 Suspected healthcare associated pneumonia. Patient has chronic interstitial lung disease and her current presentation is likely secondary to acute exacerbation of the same. Appreciate input from pulmonary. CT of the chest shows significant pulmonary fibrosis likely hypersensitivity pneumonitis Solu-Medrol- d/c IV abx- d/c Palliative care: - Focus is on comfort - Utilize comfort medications - No BiPAP  Suspected acute on chronic diastolic dysfunction. Comfort care  History of  recurrent PE. Now comfort focus  Hyperglycemia on steroids -comfort focus  Depression and anxiety. Comfort focus  Iron deficiency anemia. Holding supplementations   foley for end of life care      Medical Consultants:    PCCM  Palliative care   Discharge Exam:   Filed Vitals:   05/28/15 0652 05/28/15 1323  BP: 124/59 115/55  Pulse: 73 76  Temp: 98 F (36.7 C) 98.2 F (36.8 C)  Resp: 20 20   Filed Vitals:   05/27/15 0633 05/27/15 0745 05/28/15 0652 05/28/15 1323  BP: 118/51  124/59 115/55  Pulse: 79  73 76  Temp: 98.1 F (36.7 C)  98 F (36.7 C) 98.2 F (36.8 C)  TempSrc: Oral  Oral Oral  Resp: 16  20 20   Height:      Weight:      SpO2: 88% 92% 69% 89%        The results of significant diagnostics from this hospitalization (including imaging, microbiology, ancillary and laboratory) are listed below for reference.     Procedures and Diagnostic Studies:   Dg Chest Portable 1 View  05/19/2015  CLINICAL DATA:  Shortness of breath and hypoxia EXAM: PORTABLE CHEST 1 VIEW COMPARISON:  04/04/2014 FINDINGS: There is chronic diffuse coarse interstitial opacity which is stable from prior. Lung volumes are normal to low. Chronic cardiomegaly. There is mediastinal and hilar adenopathy, confirmed on 2016 chest CT. IMPRESSION: 1. Interstitial lung disease with diffuse opacity that is stable compared to 04/04/2014. Chronic changes are extensive enough that superimposed pneumonia or edema would easily be obscured. 2. Chronic cardiomegaly. Electronically Signed   By: Monte Fantasia M.D.   On: 05/19/2015 20:59     Labs:   Basic Metabolic Panel:  Recent  Labs Lab 05/22/15 0922 05/23/15 0435  NA 141 141  K 3.8 4.1  CL 105 106  CO2 28 27  GLUCOSE 171* 181*  BUN 21* 19  CREATININE 0.72 0.61  CALCIUM 8.4* 8.2*   GFR Estimated Creatinine Clearance: 95.4 mL/min (by C-G formula based on Cr of 0.61). Liver Function Tests:  Recent Labs Lab 05/22/15 0922  05/23/15 0435  AST 16 14*  ALT 12* 11*  ALKPHOS 70 61  BILITOT 0.8 0.6  PROT 5.7* 5.4*  ALBUMIN 2.8* 2.7*   No results for input(s): LIPASE, AMYLASE in the last 168 hours. No results for input(s): AMMONIA in the last 168 hours. Coagulation profile No results for input(s): INR, PROTIME in the last 168 hours.  CBC:  Recent Labs Lab 05/22/15 0922  WBC 15.0*  NEUTROABS 13.8*  HGB 10.5*  HCT 35.6*  MCV 85.4  PLT 326   Cardiac Enzymes: No results for input(s): CKTOTAL, CKMB, CKMBINDEX, TROPONINI in the last 168 hours. BNP: Invalid input(s): POCBNP CBG:  Recent Labs Lab 05/22/15 0822 05/22/15 1154 05/22/15 1621 05/22/15 2114 05/23/15 0838  GLUCAP 169* 141* 178* 169* 158*   D-Dimer No results for input(s): DDIMER in the last 72 hours. Hgb A1c No results for input(s): HGBA1C in the last 72 hours. Lipid Profile No results for input(s): CHOL, HDL, LDLCALC, TRIG, CHOLHDL, LDLDIRECT in the last 72 hours. Thyroid function studies No results for input(s): TSH, T4TOTAL, T3FREE, THYROIDAB in the last 72 hours.  Invalid input(s): FREET3 Anemia work up No results for input(s): VITAMINB12, FOLATE, FERRITIN, TIBC, IRON, RETICCTPCT in the last 72 hours. Microbiology Recent Results (from the past 240 hour(s))  MRSA PCR Screening     Status: Abnormal   Collection Time: 05/20/15  6:22 AM  Result Value Ref Range Status   MRSA by PCR POSITIVE (A) NEGATIVE Final    Comment:        The GeneXpert MRSA Assay (FDA approved for NASAL specimens only), is one component of a comprehensive MRSA colonization surveillance program. It is not intended to diagnose MRSA infection nor to guide or monitor treatment for MRSA infections. RESULT CALLED TO, READ BACK BY AND VERIFIED WITH: Nicolette Bang AT [id number] 05/20/15 BY L BENFIELD      Discharge Instructions:   Discharge Instructions    Discharge instructions    Complete by:  As directed   DNR 5L O2            Medication List     STOP taking these medications        albuterol (2.5 MG/3ML) 0.083% nebulizer solution  Commonly known as:  PROVENTIL     AMBULATORY NON FORMULARY MEDICATION     apixaban 5 MG Tabs tablet  Commonly known as:  ELIQUIS     budesonide 0.25 MG/2ML nebulizer solution  Commonly known as:  PULMICORT     calcium citrate-vitamin D 315-200 MG-UNIT tablet  Commonly known as:  CITRACAL+D     dextromethorphan-guaiFENesin 30-600 MG 12hr tablet  Commonly known as:  MUCINEX DM     ferrous sulfate 325 (65 FE) MG EC tablet     fluconazole 100 MG tablet  Commonly known as:  DIFLUCAN     HYDROcodone-acetaminophen 10-325 MG tablet  Commonly known as:  NORCO     ipratropium-albuterol 0.5-2.5 (3) MG/3ML Soln  Commonly known as:  DUONEB     LORazepam 1 MG tablet  Commonly known as:  ATIVAN  Replaced by:  LORazepam 2 MG/ML concentrated solution  NEURONTIN 100 MG capsule  Generic drug:  gabapentin     nitrofurantoin 100 MG capsule  Commonly known as:  MACRODANTIN     ondansetron 4 MG tablet  Commonly known as:  ZOFRAN     pantoprazole 40 MG tablet  Commonly known as:  PROTONIX     pravastatin 80 MG tablet  Commonly known as:  PRAVACHOL     predniSONE 5 MG tablet  Commonly known as:  DELTASONE     ranitidine 150 MG tablet  Commonly known as:  ZANTAC     THERAGRAN-M ADVANCED Tabs     vitamin C 500 MG tablet  Commonly known as:  ASCORBIC ACID     Vitamin D3 2000 units capsule      TAKE these medications        acetaminophen 325 MG tablet  Commonly known as:  TYLENOL  Take 2 tablets (650 mg total) by mouth every 6 (six) hours as needed for mild pain (or Fever >/= 101).     fentaNYL 100 MCG/2ML injection  Commonly known as:  SUBLIMAZE  Inject 1 mL (50 mcg total) into the vein every hour as needed for moderate pain (dyspnea).     glycopyrrolate 1 MG tablet  Commonly known as:  ROBINUL  Take 1 tablet (1 mg total) by mouth every 4 (four) hours as needed (excessive  secretions).     haloperidol 0.5 MG tablet  Commonly known as:  HALDOL  Take 1 tablet (0.5 mg total) by mouth every 4 (four) hours as needed for agitation (or delirium).     lamoTRIgine 200 MG tablet  Commonly known as:  LAMICTAL  Take 200 mg by mouth every morning.     LORazepam 2 MG/ML concentrated solution  Commonly known as:  ATIVAN  Take 0.5-1 mLs (1-2 mg total) by mouth every 3 (three) hours as needed for anxiety.     oxyCODONE 5 MG immediate release tablet  Commonly known as:  Oxy IR/ROXICODONE  Take 1 tablet (5 mg total) by mouth every 3 (three) hours as needed for moderate pain (dyspnea).     SEROQUEL 200 MG tablet  Generic drug:  QUEtiapine  Take 400 mg by mouth at bedtime.     ZOLOFT 100 MG tablet  Generic drug:  sertraline  Take 200 mg by mouth every morning.          Time coordinating discharge: 35 min  Signed:  Amalee Olsen U Leather Estis   [facility] 05/28/2015, 2:03 PM

## 2015-05-28 NOTE — Clinical Social Work Note (Addendum)
CSW received referral for patient needing [facility], CSW contacted MD to inform her bed is available for discharge today.  CSW to continue to follow patient's progress and facilitate discharge planning.  2:50pm Patient to be d/c'ed today to [facility].  Patient and family agreeable to plans will transport via ems RN to call report.   Jones Broom. [place], MSW, [place] 05/28/2015 1:33 PM

## 2015-05-28 NOTE — Progress Notes (Signed)
PROGRESS NOTE    [name]  [id number] DOB: 1945-06-27 DOA: 05/19/2015 PCP: Sheela Stack, MD   Outpatient Specialists:     Brief Narrative:  Patient was admitted on 05/19/2015, with complaint of shortness of breath, was found to have acute on chronic hypoxic respiratory failure concerning with pneumonia as well as IPF exacerbation. Patient was started on broad-spectrum antibiotics as well as BiPAP. She was also started on Solu-Medrol. ABG showed profound hypoxia despite being on BiPAP. Palliative care was consulted as well as pulmonary. Patient was started on high-dose steroids. Not showing any significant improvement in her respiratory status. 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 Suspected healthcare associated pneumonia. Patient has chronic interstitial lung disease and her current presentation is likely secondary to acute exacerbation of the same. Appreciate input from pulmonary. CT of the chest shows significant pulmonary fibrosis likely hypersensitivity pneumonitis Solu-Medrol- d/c IV abx- d/c Palliative care: - Focus is on comfort - Utilize comfort medications - No BiPAP  Suspected acute on chronic diastolic dysfunction. Comfort care  History of recurrent PE. Now comfort focus  Hyperglycemia on steroids -comfort focus  Depression and anxiety. Comfort focus  Iron deficiency anemia. Holding supplementations  Place foley for end of life care Do not think [facility] would be able to handle end of life  care for patient -O2 sats vary from 60s-90s -hope for [facility]   DVT prophylaxis:  End of life  Code Status: DNR   Family Communication: Family at bedside 5/7  Disposition Plan:  Per palliative-- in hospital death?-- will explore residential hospice options   Consultants:   Palliative  pulm  Procedures:        Subjective: Not feeling well Still sleepy- requiring feedings   Objective: Filed Vitals:   05/26/15 1425 05/27/15 0633 05/27/15 0745 05/28/15 0652  BP: 121/60 118/51  124/59  Pulse: 75 79  73  Temp: 97.6 F (36.4 C) 98.1 F (36.7 C)  98 F (36.7 C)  TempSrc: Oral Oral  Oral  Resp: 17 16  20   Height:      Weight:      SpO2: 92% 88% 92% 69%    Intake/Output Summary (Last 24 hours) at 05/28/15 0911 Last data filed at 05/28/15 0653  Gross per 24 hour  Intake    880 ml  Output   1850 ml  Net   -970 ml   Filed Weights   05/20/15 0118 05/22/15 0935  Weight: 137.8 kg (303 lb 12.7 oz) 138.6 kg (305 lb 8.9 oz)    Examination:  General exam: [name], on [place] Central nervous system: sleepier than yesterday     Data Reviewed: I have personally reviewed following labs and imaging studies  CBC:  Recent Labs Lab 05/22/15 0922  WBC 15.0*  NEUTROABS 13.8*  HGB 10.5*  HCT 35.6*  MCV 85.4  PLT [id number]   Basic Metabolic Panel:  Recent Labs Lab 05/22/15 0922 05/23/15 0435  NA 141 141  K 3.8 4.1  CL 105 106  CO2 28 27  GLUCOSE 171* 181*  BUN 21* 19  CREATININE 0.72 0.61  CALCIUM 8.4* 8.2*   GFR: Estimated Creatinine Clearance: 95.4 mL/min (by C-G formula based on Cr of 0.61). Liver Function Tests:  Recent Labs Lab 05/22/15 0922 05/23/15 0435  AST 16 14*  ALT 12* 11*  ALKPHOS 70 61  BILITOT 0.8 0.6  PROT 5.7* 5.4*  ALBUMIN 2.8* 2.7*   No results for input(s): LIPASE, AMYLASE in the last 168 hours. No results for input(s): AMMONIA in the last 168 hours. Coagulation Profile: No results for input(s): INR, PROTIME in the  last 168 hours. Cardiac Enzymes: No results for input(s): CKTOTAL, CKMB, CKMBINDEX, TROPONINI in the last 168 hours. BNP (last 3 results) No results for input(s): PROBNP in the last 8760 hours. HbA1C: No results for input(s): HGBA1C in the last 72 hours. CBG:  Recent Labs Lab 05/22/15 0822 05/22/15 1154 05/22/15 1621 05/22/15 2114 05/23/15 0838  GLUCAP 169* 141* 178* 169* 158*   Lipid Profile: No results for input(s): CHOL, HDL, LDLCALC, TRIG, CHOLHDL, LDLDIRECT in the last 72 hours. Thyroid Function Tests: No results for input(s): TSH, T4TOTAL, FREET4, T3FREE, THYROIDAB in the last 72 hours. Anemia Panel: No results for input(s): VITAMINB12, FOLATE, FERRITIN, TIBC, IRON, RETICCTPCT in the last 72 hours. Urine analysis:    Component Value Date/Time   COLORURINE YELLOW 09/09/2012 1811   APPEARANCEUR CLOUDY* 09/09/2012 1811   LABSPEC 1.026 09/09/2012 1811   PHURINE 6.0 09/09/2012 1811   GLUCOSEU NEGATIVE 09/09/2012 1811   GLUCOSEU NEG mg/dL 09/04/2009 2008   HGBUR TRACE* 09/09/2012 1811   HGBUR trace-intact 01/25/2010 1354   BILIRUBINUR NEGATIVE 09/09/2012 1811   KETONESUR NEGATIVE 09/09/2012 1811   PROTEINUR NEGATIVE 09/09/2012 1811   UROBILINOGEN 0.2 09/09/2012 1811   NITRITE POSITIVE* 09/09/2012 1811   LEUKOCYTESUR MODERATE* 09/09/2012 1811    Recent Results (from the past 240 hour(s))  MRSA PCR Screening     Status: Abnormal   Collection Time: 05/20/15  6:22 AM  Result Value Ref Range Status   MRSA by PCR POSITIVE (A) NEGATIVE Final    Comment:        The GeneXpert MRSA Assay (FDA approved for NASAL specimens only), is one component of a comprehensive MRSA colonization surveillance program. It is not intended to diagnose MRSA infection nor to guide or monitor treatment for MRSA infections. RESULT CALLED TO, READ BACK BY AND VERIFIED WITH: Nicolette Bang AT [id number] 05/20/15 BY L BENFIELD       Anti-infectives    Start     Dose/Rate Route Frequency Ordered  Stop   05/23/15 1200  vancomycin (VANCOCIN) 1,250 mg in sodium chloride 0.9 % 250 mL IVPB  Status:  Discontinued     1,250 mg 166.7 mL/hr over 90 Minutes Intravenous Every 12 hours 05/23/15 1013 05/23/15 1606   05/20/15 0800  vancomycin (VANCOCIN) 1,250 mg in sodium chloride 0.9 % 250 mL IVPB  Status:  Discontinued     1,250 mg 166.7 mL/hr over 90 Minutes Intravenous Every 12 hours 05/20/15 0132 05/23/15 1013   05/20/15 0600  ceFEPIme (MAXIPIME) 2 g in dextrose 5 % 50 mL IVPB  Status:  Discontinued     2 g 100 mL/hr over 30 Minutes Intravenous Every 8 hours 05/20/15 0101 05/23/15 1606   05/19/15 2230  vancomycin (VANCOCIN) IVPB 1000 mg/200 mL premix     1,000 mg 200 mL/hr over 60 Minutes Intravenous  Once 05/19/15 2216 05/20/15 0013   05/19/15 2230  piperacillin-tazobactam (ZOSYN) IVPB 3.375 g  3.375 g 100 mL/hr over 30 Minutes Intravenous  Once 05/19/15 2216 05/19/15 2310       Radiology Studies: No results found.      Scheduled Meds: . lamoTRIgine  200 mg Oral q morning - 10a  . QUEtiapine  400 mg Oral QHS  . sertraline  200 mg Oral q morning - 10a   Continuous Infusions:    LOS: 9 days    Time spent: 15 min    [employer], DO [facility] Pager [phone]  If 7PM-7AM, please contact night-coverage [email or web address] Password [email or web address] 05/28/2015, 9:11 AM

## 2015-05-28 NOTE — Progress Notes (Signed)
[place]- 6N-03   [facility] RN Note /[facility]  BP is able to offer bed available for patient to transfer today.   Spoke with the patient directly and she did agree and stated she wants to go to [facility].  TC placed to patient's son [name].   Meeting scheduled for 12 N today to complete registration paperwork.    SW Randall Hiss notified of bed availability.    Mickie Kay, [facility] Liason  [phone]

## 2015-05-28 NOTE — Progress Notes (Signed)
Report called to [facility]. Spoke with [place].

## 2015-05-28 NOTE — Progress Notes (Addendum)
[place]- 6N-03  [facility]ospice [facility] RN liason visit- [facility]   Received request from [place] of family interest in [place] with request to transfer today.  Chart reviewed.  Met with patient's son [name] to confirm interes and explain services.  Son agreeable for patient to transfer today.  CSW aware.  Registration paperwork has been completed. Dr. Orpah Melter to assume care per family request.  Please fax discharge summary to [phone].  RN please call report to [phone].   Please arrange for transport as soon as possible.  Please call with any questions.   Thank you for this referral.   Mickie Kay, RN,  [facility]  [phone]   Error - RN please call report to [phone]

## 2015-06-21 DEATH — deceased
